# Patient Record
Sex: Male | Born: 1955 | Race: White | Hispanic: No | Smoking: Former smoker
Health system: Southern US, Community
[De-identification: ages and names within clinical notes are randomized; demographics above are authoritative.]

## PROBLEM LIST (undated history)

## (undated) DIAGNOSIS — I700 Atherosclerosis of aorta: Secondary | ICD-10-CM

## (undated) DIAGNOSIS — F32A Depression, unspecified: Secondary | ICD-10-CM

## (undated) DIAGNOSIS — Z87442 Personal history of urinary calculi: Secondary | ICD-10-CM

## (undated) DIAGNOSIS — I2510 Atherosclerotic heart disease of native coronary artery without angina pectoris: Secondary | ICD-10-CM

## (undated) DIAGNOSIS — Z982 Presence of cerebrospinal fluid drainage device: Secondary | ICD-10-CM

## (undated) DIAGNOSIS — I4510 Unspecified right bundle-branch block: Secondary | ICD-10-CM

## (undated) DIAGNOSIS — F1010 Alcohol abuse, uncomplicated: Secondary | ICD-10-CM

## (undated) DIAGNOSIS — F329 Major depressive disorder, single episode, unspecified: Secondary | ICD-10-CM

## (undated) DIAGNOSIS — D751 Secondary polycythemia: Secondary | ICD-10-CM

## (undated) DIAGNOSIS — Z87898 Personal history of other specified conditions: Secondary | ICD-10-CM

## (undated) DIAGNOSIS — I10 Essential (primary) hypertension: Secondary | ICD-10-CM

## (undated) DIAGNOSIS — Z720 Tobacco use: Secondary | ICD-10-CM

## (undated) DIAGNOSIS — F419 Anxiety disorder, unspecified: Secondary | ICD-10-CM

## (undated) DIAGNOSIS — K5792 Diverticulitis of intestine, part unspecified, without perforation or abscess without bleeding: Secondary | ICD-10-CM

## (undated) DIAGNOSIS — E785 Hyperlipidemia, unspecified: Secondary | ICD-10-CM

## (undated) HISTORY — DX: Major depressive disorder, single episode, unspecified: F32.9

## (undated) HISTORY — DX: Unspecified right bundle-branch block: I45.10

## (undated) HISTORY — DX: Anxiety disorder, unspecified: F41.9

## (undated) HISTORY — PX: COLONOSCOPY: SHX174

## (undated) HISTORY — PX: SKIN GRAFT: SHX250

## (undated) HISTORY — DX: Hyperlipidemia, unspecified: E78.5

## (undated) HISTORY — DX: Atherosclerotic heart disease of native coronary artery without angina pectoris: I25.10

## (undated) HISTORY — DX: Alcohol abuse, uncomplicated: F10.10

## (undated) HISTORY — DX: Essential (primary) hypertension: I10

## (undated) HISTORY — DX: Depression, unspecified: F32.A

## (undated) HISTORY — PX: WISDOM TOOTH EXTRACTION: SHX21

## (undated) HISTORY — DX: Tobacco use: Z72.0

## (undated) HISTORY — DX: Atherosclerosis of aorta: I70.0

## (undated) HISTORY — DX: Presence of cerebrospinal fluid drainage device: Z98.2

---
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
## 2006-05-21 ENCOUNTER — Emergency Department (HOSPITAL_COMMUNITY): Admission: EM | Admit: 2006-05-21 | Discharge: 2006-05-21 | Payer: Self-pay | Admitting: Emergency Medicine

## 2012-02-05 ENCOUNTER — Encounter: Payer: Self-pay | Admitting: Internal Medicine

## 2012-02-06 ENCOUNTER — Encounter: Payer: Self-pay | Admitting: Internal Medicine

## 2012-02-06 ENCOUNTER — Telehealth: Payer: Self-pay | Admitting: Critical Care Medicine

## 2012-02-06 ENCOUNTER — Ambulatory Visit (INDEPENDENT_AMBULATORY_CARE_PROVIDER_SITE_OTHER): Payer: BC Managed Care – PPO | Admitting: Internal Medicine

## 2012-02-06 VITALS — BP 152/98 | HR 90 | Temp 98.4°F | Ht 71.0 in | Wt 218.4 lb

## 2012-02-06 DIAGNOSIS — I10 Essential (primary) hypertension: Secondary | ICD-10-CM

## 2012-02-06 DIAGNOSIS — R05 Cough: Secondary | ICD-10-CM | POA: Insufficient documentation

## 2012-02-06 MED ORDER — TRAMADOL HCL 50 MG PO TABS: ORAL_TABLET | ORAL | Status: AC

## 2012-02-06 NOTE — Assessment & Plan Note (Signed)
The most common causes of chronic cough in immunocompetent adults include the following: upper airway cough syndrome (UACS), previously referred to as postnasal drip syndrome (PNDS), which is caused by variety of rhinosinus conditions; (2) asthma; (3) GERD; (4) chronic bronchitis from cigarette smoking or other inhaled environmental irritants; (5) nonasthmatic eosinophilic bronchitis; and (6) bronchiectasis.   These conditions, singly or in combination, have accounted for up to 94% of the causes of chronic cough in prospective studies.   Other conditions have constituted no >6% of the causes in prospective studies These have included bronchogenic carcinoma, chronic interstitial pneumonia, sarcoidosis, left ventricular failure, ACEI-induced cough, and aspiration from a condition associated with pharyngeal dysfunction.   Chronic cough is often simultaneously caused by more than one condition. A single cause has been found from 38 to 82% of the time, multiple causes from 18 to 62%. Multiply caused cough has been the result of three diseases up to 42% of the time.   Most likely this is all acie related so try off acei first and eliminate cyclical coughing with tramadol.

## 2012-02-06 NOTE — Patient Instructions (Signed)
Stop lisinopril   Bystolic 10 mg one daily.  Take delsym two tsp every 12 hours and supplement if needed with  tramadol 50 mg up to 1-  2 every 4 hours to suppress the urge to cough. Swallowing water or using ice chips/non mint and menthol containing candies (such as lifesavers or sugarless jolly ranchers) are also effective.  You should rest your voice and avoid activities that you know make you cough.  Once you have eliminated the cough for 3 straight days try reducing the tramadol first,  then the delsym as tolerated.    GERD (REFLUX)  is an extremely common cause of respiratory symptoms like a cough, many times with no significant heartburn at all.    It can be treated with medication, but also with lifestyle changes including avoidance of late meals, excessive alcohol, smoking cessation, and avoid fatty foods, chocolate, peppermint, colas, red wine, and acidic juices such as orange juice.  NO MINT OR MENTHOL PRODUCTS SO NO COUGH DROPS  USE SUGARLESS CANDY INSTEAD (jolley ranchers or Stover's)  NO OIL BASED VITAMINS - use powdered substitutes.  If not 100% better in 4 weeks return here, if better return to Dr Earl Gala.

## 2012-02-06 NOTE — Telephone Encounter (Signed)
At OV today, was recommended to use tramadol for cough, However this was NOT sent to [facility] on eprescribe.  I sent 40 # to [facility] per cough protocol of the tramadol.

## 2012-02-06 NOTE — Assessment & Plan Note (Addendum)
ACE inhibitors are problematic in  pts with airway complaints because  even experienced pulmonologists can't always distinguish ace effects from copd/asthma/pnds/ allergies etc.  By themselves they don't actually cause a problem, much like oxygen can't by itself start a fire, but they certainly serve as a powerful catalyst or enhancer for any "fire"  or inflammatory process in the upper airway, be it caused by an ET  tube or more commonly reflux (especially in the obese or pts with known GERD or who are on biphoshonates) or URI's, due to interference with bradykinin clearance.  The effects of acei on bradykinin levels occurs in 100% of pt's on acei (unless they surreptitiously stop the med!) but the classic cough is only reported in 5%.  This leaves 95% of pts on acei's  with a variety of syndromes including no identifiable symptom in most  vs non-specific symptoms that wax and wane depending on what other insult is occuring at the level of the upper airway (reflux and cyclical cough come to mind in this case)  Only way to tell what role acei is playing is trial off x 4-6 weeks and f/u with Dr Earl Gala for maint hbp rx if better on bisoprolol  10 mg daily

## 2012-02-06 NOTE — Progress Notes (Signed)
  Subjective:    Patient ID: Luis Henderson, male    DOB: November 15, 1955  MRN: [id number]  HPI  6 yowm quit smoking around 2000 with tendency to "bronchits" every other year or so   referred by Dr Earl Gala for refractory cough/wheezing 02/06/2012 to pulmonary clinic.   02/06/2012 1st pulmonary eval cc acute onset  8 weeks prior to OV  chest congestion with temp 102  with flu-like illness resolved w/in 5 days with residual daily > nightly cough prod minimal white mucus does not wake him up. No better on prednisone, augmentin just finished 2 days prior to OV, no response advair, ? Some better with albuterol. Stopped acei this am.   No sob unless coughing. Sense of pnds daytime, slt hoarseness, some chest tightness midline no ex or pleuritic component  Sleeping ok without nocturnal  or early am exacerbation  of respiratory  c/o's or need for noct saba. Also denies any obvious fluctuation of symptoms with weather or environmental changes or other aggravating or alleviating factors except as outlined above    Review of Systems  Constitutional: Negative for fever and unexpected weight change.  HENT: Positive for ear pain and postnasal drip. Negative for nosebleeds, congestion, sore throat, rhinorrhea, sneezing, trouble swallowing, dental problem and sinus pressure.   Eyes: Negative for redness and itching.  Respiratory: Positive for cough, chest tightness and wheezing. Negative for shortness of breath.   Cardiovascular: Positive for chest pain. Negative for palpitations and leg swelling.  Gastrointestinal: Negative for nausea and vomiting.  Genitourinary: Negative for dysuria.  Musculoskeletal: Negative for joint swelling.  Skin: Negative for rash.  Neurological: Negative for headaches.  Hematological: Does not bruise/bleed easily.  Psychiatric/Behavioral: Negative for dysphoric mood. The patient is not nervous/anxious.        Objective:   Physical Exam  Mod obese pleasant amb wm mild voice  fatigue Wt 218 02/06/2012 HEENT: nl dentition, turbinates, and orophanx. Nl external ear canals without cough reflex   NECK :  without JVD/Nodes/TM/ nl carotid upstrokes bilaterally   LUNGS: no acc muscle use, clear to A and P bilaterally without cough on insp or exp maneuvers   CV:  RRR  no s3 or murmur or increase in P2, no edema   ABD:  soft and nontender with nl excursion in the supine position. No bruits or organomegaly, bowel sounds nl  MS:  warm without deformities, calf tenderness, cyanosis or clubbing  SKIN: warm and dry without lesions    NEURO:  alert, approp, no deficits    cxr reported nl 2 weeks prior to OV      Assessment & Plan:

## 2012-08-24 ENCOUNTER — Emergency Department (HOSPITAL_COMMUNITY): Payer: BC Managed Care – PPO

## 2012-08-24 ENCOUNTER — Emergency Department (HOSPITAL_COMMUNITY)
Admission: EM | Admit: 2012-08-24 | Discharge: 2012-08-24 | Disposition: A | Discharge status: Home / Self Care | Payer: BC Managed Care – PPO | Attending: Emergency Medicine | Admitting: Emergency Medicine

## 2012-08-24 DIAGNOSIS — F329 Major depressive disorder, single episode, unspecified: Secondary | ICD-10-CM | POA: Insufficient documentation

## 2012-08-24 DIAGNOSIS — N200 Calculus of kidney: Secondary | ICD-10-CM | POA: Insufficient documentation

## 2012-08-24 DIAGNOSIS — F411 Generalized anxiety disorder: Secondary | ICD-10-CM | POA: Insufficient documentation

## 2012-08-24 DIAGNOSIS — E119 Type 2 diabetes mellitus without complications: Secondary | ICD-10-CM | POA: Insufficient documentation

## 2012-08-24 DIAGNOSIS — F3289 Other specified depressive episodes: Secondary | ICD-10-CM | POA: Insufficient documentation

## 2012-08-24 DIAGNOSIS — Z87891 Personal history of nicotine dependence: Secondary | ICD-10-CM | POA: Insufficient documentation

## 2012-08-24 DIAGNOSIS — E785 Hyperlipidemia, unspecified: Secondary | ICD-10-CM | POA: Insufficient documentation

## 2012-08-24 DIAGNOSIS — I10 Essential (primary) hypertension: Secondary | ICD-10-CM | POA: Insufficient documentation

## 2012-08-24 DIAGNOSIS — Z79899 Other long term (current) drug therapy: Secondary | ICD-10-CM | POA: Insufficient documentation

## 2012-08-24 LAB — URINALYSIS, ROUTINE W REFLEX MICROSCOPIC
Bilirubin Urine: NEGATIVE
Specific Gravity, Urine: 1.014 (ref 1.005–1.030)
pH: 5 (ref 5.0–8.0)

## 2012-08-24 LAB — CBC WITH DIFFERENTIAL/PLATELET
Eosinophils Absolute: 0.2 10*3/uL (ref 0.0–0.7)
Eosinophils Relative: 2 % (ref 0–5)
HCT: 50.5 % (ref 39.0–52.0)
Hemoglobin: 17.2 g/dL — ABNORMAL HIGH (ref 13.0–17.0)
Lymphs Abs: 2 10*3/uL (ref 0.7–4.0)
MCH: 31 pg (ref 26.0–34.0)
MCV: 91 fL (ref 78.0–100.0)
Monocytes Absolute: 0.7 10*3/uL (ref 0.1–1.0)
Monocytes Relative: 10 % (ref 3–12)
RBC: 5.55 MIL/uL (ref 4.22–5.81)

## 2012-08-24 LAB — COMPREHENSIVE METABOLIC PANEL
Alkaline Phosphatase: 87 U/L (ref 39–117)
BUN: 17 mg/dL (ref 6–23)
Calcium: 9.6 mg/dL (ref 8.4–10.5)
GFR calc Af Amer: >90 mL/min (ref >90)
Glucose, Bld: 127 mg/dL — ABNORMAL HIGH (ref 70–99)
Potassium: 4.8 mEq/L (ref 3.5–5.1)
Total Protein: 7.2 g/dL (ref 6.0–8.3)

## 2012-08-24 LAB — URINE MICROSCOPIC-ADD ON

## 2012-08-24 MED ORDER — ONDANSETRON HCL 4 MG/2ML IJ SOLN
4.0000 mg | Freq: Once | INTRAMUSCULAR | Status: AC
  Administered 2012-08-24: 4 mg via INTRAVENOUS
  Filled 2012-08-24: qty 2

## 2012-08-24 MED ORDER — SODIUM CHLORIDE 0.9 % IV BOLUS (SEPSIS)
1000.0000 mL | Freq: Once | INTRAVENOUS | Status: AC
  Administered 2012-08-24: 1000 mL via INTRAVENOUS

## 2012-08-24 MED ORDER — ONDANSETRON HCL 4 MG PO TABS: 4.0000 mg | ORAL_TABLET | Freq: Four times a day (QID) | ORAL | Status: DC

## 2012-08-24 MED ORDER — HYDROMORPHONE HCL PF 1 MG/ML IJ SOLN
1.0000 mg | Freq: Once | INTRAMUSCULAR | Status: AC
  Administered 2012-08-24: 1 mg via INTRAVENOUS
  Filled 2012-08-24: qty 1

## 2012-08-24 MED ORDER — OXYCODONE-ACETAMINOPHEN 5-325 MG PO TABS: 1.0000 | ORAL_TABLET | Freq: Four times a day (QID) | ORAL | Status: DC | PRN

## 2012-08-24 NOTE — ED Provider Notes (Signed)
History     CSN: [id number]  Arrival date & time 08/24/12  1145   First MD Initiated Contact with Patient 08/24/12 1222      Chief Complaint  Patient presents with  . Flank Pain    (Consider location/radiation/quality/duration/timing/severity/associated sxs/prior treatment) HPI Comments: 56 year old male presents the emergency department with sudden onset right-sided flank pain earlier this morning. He describes pain as sharp, nonradiating rated 10 out of 10. States he passed a stone on Tuesday, and had similar pain prior to that. He had no one around and was unable to seek care at that time. He is unsure what the stone looked like. His history 1 kidney stone in the past prior to these episodes. Admits to associated nausea without vomiting. No fever chills. Denies urinary symptoms including dysuria or hematuria. He has not tried any alleviating factors for his pain.  Patient is a 56 y.o. male presenting with flank pain. The history is provided by the patient.  Flank Pain Associated symptoms include nausea. Pertinent negatives include no chest pain, chills, fever or vomiting.    Past Medical History  Diagnosis Date  . Diabetes mellitus   . Hypertension   . Hyperlipidemia   . Depression   . Anxiety     Past Surgical History  Procedure Date  . Wisdom tooth extraction   . Skin graft     left hand    Family History  Problem Relation Age of Onset  . Hyperlipidemia Father     History  Substance Use Topics  . Smoking status: Former Smoker -- 1.0 packs/day for 28 years    Types: Cigarettes    Quit date: 10/17/1999  . Smokeless tobacco: Never Used  . Alcohol Use: Yes      Review of Systems  Constitutional: Negative for fever and chills.  HENT: Negative.   Respiratory: Negative for shortness of breath.   Cardiovascular: Negative for chest pain.  Gastrointestinal: Positive for nausea. Negative for vomiting.  Genitourinary: Positive for flank pain. Negative for dysuria  and hematuria.  Musculoskeletal: Positive for back pain.  Skin: Negative.   Neurological: Negative for light-headedness.  Psychiatric/Behavioral: Negative.     Allergies  Codeine and Lipitor  Home Medications   Current Outpatient Rx  Name  Route  Sig  Dispense  Refill  . ALBUTEROL SULFATE HFA 108 (90 BASE) MCG/ACT IN AERS   Inhalation   Inhale 2 puffs into the lungs every 4 (four) hours as needed.         Marland Kitchen VITAMIN D PO   Oral   Take by mouth daily.         Marland Kitchen FOLIC ACID 800 MCG PO TABS      Take as directed         . SERTRALINE HCL 100 MG PO TABS   Oral   Take 100 mg by mouth daily.         Marland Kitchen SIMVASTATIN 20 MG PO TABS   Oral   Take 20 mg by mouth every evening.           BP 165/94  Pulse 68  Temp 98.8 F (37.1 C) (Oral)  Resp 18  SpO2 97%  Physical Exam  Constitutional: He is oriented to person, place, and time. He appears well-developed. No distress.       Obese, uncomfortable  HENT:  Head: Normocephalic and atraumatic.  Eyes: Conjunctivae normal and EOM are normal.  Neck: Normal range of motion. Neck supple.  Cardiovascular: Normal rate,  regular rhythm and normal heart sounds.   Pulmonary/Chest: Effort normal and breath sounds normal. No respiratory distress.  Abdominal: Normal appearance and bowel sounds are normal. He exhibits no distension. There is tenderness (right side). There is no rebound, no guarding and no CVA tenderness.  Musculoskeletal: Normal range of motion.  Neurological: He is alert and oriented to person, place, and time.  Skin: Skin is warm and dry.  Psychiatric: His speech is normal and behavior is normal. His mood appears anxious.    ED Course  Procedures (including critical care time)  Labs Reviewed  URINALYSIS, ROUTINE W REFLEX MICROSCOPIC - Abnormal; Notable for the following:    Hgb urine dipstick LARGE (*)     All other components within normal limits  CBC WITH DIFFERENTIAL - Abnormal; Notable for the following:      Hemoglobin 17.2 (*)     All other components within normal limits  COMPREHENSIVE METABOLIC PANEL - Abnormal; Notable for the following:    Glucose, Bld 127 (*)     AST 43 (*)     GFR calc non Af Amer 82 (*)     All other components within normal limits  URINE MICROSCOPIC-ADD ON   Ct Abdomen Pelvis Wo Contrast  08/24/2012  *RADIOLOGY REPORT*  Clinical Data: Flank pain.  Right-sided pain.  Nausea.  History of diabetes, hypertension, stones.  Microhematuria present.  CT ABDOMEN AND PELVIS WITHOUT CONTRAST  Technique:  Multidetector CT imaging of the abdomen and pelvis was performed following the standard protocol without intravenous contrast.  Comparison: 05/21/2006  Findings: Images of the lung bases shows scattered patchy densities, consistent with scarring/non calcified pleural plaques.  The liver is fatty without focal mass.  No focal abnormality identified within the spleen, pancreas, or adrenal glands.  The gallbladder is present.  There is a left renal cyst measuring 7.6 cm in diameter.  There is stranding surrounding the right kidney and right-sided hydronephrosis/hydroureter relative to the a 3 mm right ureteral vesicle junction calculus.  Small bilateral intrarenal calculi identified as well.  The stomach and small bowel loops are normal in appearance.  There are numerous colonic diverticula.  No CT evidence for acute diverticulitis however. The appendix is well seen and has a normal appearance.  No retroperitoneal or mesenteric adenopathy.  There is atherosclerotic calcification of the abdominal aorta.  No aneurysm. Visualized osseous structures have a normal appearance.  IMPRESSION:  1.  Obstructing right ureteral vesicle junction calculus measuring 3 mm. 2.  Left renal cyst. 3.  Diverticulosis without evidence for acute diverticulitis. 4.  Normal appendix.   Original Report Authenticated By: Norva Pavlov, M.D.      1. Nephrolithiasis       MDM  56 y/o male with sudden onset right  sided flank pain. Afebrile. U/A with large hgb. Obtaining CT abdomen/pelvis without contrast. Pain and nausea improved with zofran and dilaudid. 2:05 PM CT scan showing 3mm obstructing right UVJ calculus. Patient's pain still under control. Discharge with zofran and percocet. Advised him to stay well hydrated. Urine strainer given. Urology follow up discussed.       Trevor Mace, PA-C 08/24/12 1406

## 2012-08-24 NOTE — ED Provider Notes (Signed)
Medical screening examination/treatment/procedure(s) were performed by non-physician practitioner and as supervising physician I was immediately available for consultation/collaboration.   Celene Kras, MD 08/24/12 1430

## 2012-08-24 NOTE — ED Notes (Signed)
Pt presents to department for evaluation of R sided flank pain. States history of kidney stones. 10/10 pain upon arrival. Denies urinary symptoms. Also states nausea. He is conscious alert and oriented x4.

## 2012-08-28 ENCOUNTER — Other Ambulatory Visit: Payer: Self-pay | Admitting: Urology

## 2012-08-29 ENCOUNTER — Ambulatory Visit: Admit: 2012-08-29 | Payer: Self-pay | Admitting: Urology

## 2012-08-29 SURGERY — CYSTOURETEROSCOPY, WITH RETROGRADE PYELOGRAM AND STENT INSERTION
Anesthesia: General | Laterality: Right

## 2014-10-15 ENCOUNTER — Ambulatory Visit
Admission: RE | Admit: 2014-10-15 | Discharge: 2014-10-15 | Disposition: A | Discharge status: Home / Self Care | Payer: No Typology Code available for payment source | Source: Ambulatory Visit | Attending: Nurse Practitioner | Admitting: Nurse Practitioner

## 2014-10-15 ENCOUNTER — Other Ambulatory Visit: Payer: Self-pay | Admitting: Nurse Practitioner

## 2014-10-15 DIAGNOSIS — M25552 Pain in left hip: Secondary | ICD-10-CM

## 2014-10-15 DIAGNOSIS — M25551 Pain in right hip: Secondary | ICD-10-CM

## 2014-10-15 DIAGNOSIS — M549 Dorsalgia, unspecified: Secondary | ICD-10-CM

## 2014-10-20 ENCOUNTER — Other Ambulatory Visit: Payer: Self-pay | Admitting: Internal Medicine

## 2014-10-20 DIAGNOSIS — M545 Low back pain, unspecified: Secondary | ICD-10-CM

## 2014-10-21 ENCOUNTER — Ambulatory Visit
Admission: RE | Admit: 2014-10-21 | Discharge: 2014-10-21 | Disposition: A | Discharge status: Home / Self Care | Payer: No Typology Code available for payment source | Source: Ambulatory Visit | Attending: Internal Medicine | Admitting: Internal Medicine

## 2014-10-21 DIAGNOSIS — M545 Low back pain, unspecified: Secondary | ICD-10-CM

## 2015-01-11 ENCOUNTER — Emergency Department (HOSPITAL_COMMUNITY): Payer: Self-pay

## 2015-01-11 ENCOUNTER — Encounter (HOSPITAL_COMMUNITY): Payer: Self-pay | Admitting: Emergency Medicine

## 2015-01-11 ENCOUNTER — Emergency Department (HOSPITAL_COMMUNITY)
Admission: EM | Admit: 2015-01-11 | Discharge: 2015-01-12 | Disposition: A | Discharge status: Home / Self Care | Payer: Self-pay | Attending: Emergency Medicine | Admitting: Emergency Medicine

## 2015-01-11 DIAGNOSIS — I10 Essential (primary) hypertension: Secondary | ICD-10-CM | POA: Insufficient documentation

## 2015-01-11 DIAGNOSIS — R1110 Vomiting, unspecified: Secondary | ICD-10-CM | POA: Insufficient documentation

## 2015-01-11 DIAGNOSIS — Z8659 Personal history of other mental and behavioral disorders: Secondary | ICD-10-CM | POA: Insufficient documentation

## 2015-01-11 DIAGNOSIS — R1011 Right upper quadrant pain: Secondary | ICD-10-CM | POA: Insufficient documentation

## 2015-01-11 DIAGNOSIS — Z7982 Long term (current) use of aspirin: Secondary | ICD-10-CM | POA: Insufficient documentation

## 2015-01-11 DIAGNOSIS — Z79899 Other long term (current) drug therapy: Secondary | ICD-10-CM | POA: Insufficient documentation

## 2015-01-11 DIAGNOSIS — Z8639 Personal history of other endocrine, nutritional and metabolic disease: Secondary | ICD-10-CM | POA: Insufficient documentation

## 2015-01-11 DIAGNOSIS — E119 Type 2 diabetes mellitus without complications: Secondary | ICD-10-CM | POA: Insufficient documentation

## 2015-01-11 LAB — URINE MICROSCOPIC-ADD ON

## 2015-01-11 LAB — COMPREHENSIVE METABOLIC PANEL
ALBUMIN: 4.8 g/dL (ref 3.5–5.2)
ALK PHOS: 80 U/L (ref 39–117)
ALT: 39 U/L (ref 0–53)
AST: 30 U/L (ref 0–37)
Anion gap: 14 (ref 5–15)
BILIRUBIN TOTAL: 1.2 mg/dL (ref 0.3–1.2)
BUN: 18 mg/dL (ref 6–23)
CHLORIDE: 97 mmol/L (ref 96–112)
CO2: 26 mmol/L (ref 19–32)
Calcium: 9.8 mg/dL (ref 8.4–10.5)
Creatinine, Ser: 1.7 mg/dL — ABNORMAL HIGH (ref 0.50–1.35)
GFR calc non Af Amer: 42 mL/min — ABNORMAL LOW (ref >90)
GFR, EST AFRICAN AMERICAN: 49 mL/min — AB (ref >90)
Glucose, Bld: 122 mg/dL — ABNORMAL HIGH (ref 70–99)
Potassium: 4.3 mmol/L (ref 3.5–5.1)
Sodium: 137 mmol/L (ref 135–145)
Total Protein: 7.9 g/dL (ref 6.0–8.3)

## 2015-01-11 LAB — URINALYSIS, ROUTINE W REFLEX MICROSCOPIC
Bilirubin Urine: NEGATIVE
GLUCOSE, UA: 100 mg/dL — AB
Ketones, ur: NEGATIVE mg/dL
Leukocytes, UA: NEGATIVE
Nitrite: NEGATIVE
PH: 5.5 (ref 5.0–8.0)
PROTEIN: 100 mg/dL — AB
Specific Gravity, Urine: 1.021 (ref 1.005–1.030)
Urobilinogen, UA: 0.2 mg/dL (ref 0.0–1.0)

## 2015-01-11 LAB — CBC WITH DIFFERENTIAL/PLATELET
BASOS PCT: 0 % (ref 0–1)
Basophils Absolute: 0 10*3/uL (ref 0.0–0.1)
Eosinophils Absolute: 0 10*3/uL (ref 0.0–0.7)
Eosinophils Relative: 0 % (ref 0–5)
HCT: 55.4 % — ABNORMAL HIGH (ref 39.0–52.0)
HEMOGLOBIN: 18.4 g/dL — AB (ref 13.0–17.0)
LYMPHS ABS: 1.8 10*3/uL (ref 0.7–4.0)
Lymphocytes Relative: 15 % (ref 12–46)
MCH: 29.6 pg (ref 26.0–34.0)
MCHC: 33.2 g/dL (ref 30.0–36.0)
MCV: 89.1 fL (ref 78.0–100.0)
Monocytes Absolute: 1.8 10*3/uL — ABNORMAL HIGH (ref 0.1–1.0)
Monocytes Relative: 15 % — ABNORMAL HIGH (ref 3–12)
NEUTROS PCT: 70 % (ref 43–77)
Neutro Abs: 8.5 10*3/uL — ABNORMAL HIGH (ref 1.7–7.7)
Platelets: 250 10*3/uL (ref 150–400)
RBC: 6.22 MIL/uL — ABNORMAL HIGH (ref 4.22–5.81)
RDW: 13.2 % (ref 11.5–15.5)
WBC: 12.2 10*3/uL — ABNORMAL HIGH (ref 4.0–10.5)

## 2015-01-11 LAB — LIPASE, BLOOD: Lipase: 42 U/L (ref 11–59)

## 2015-01-11 MED ORDER — MORPHINE SULFATE 4 MG/ML IJ SOLN
4.0000 mg | Freq: Once | INTRAMUSCULAR | Status: AC
  Administered 2015-01-11: 4 mg via INTRAVENOUS
  Filled 2015-01-11: qty 1

## 2015-01-11 MED ORDER — SODIUM CHLORIDE 0.9 % IV BOLUS (SEPSIS)
1000.0000 mL | Freq: Once | INTRAVENOUS | Status: AC
  Administered 2015-01-11: 1000 mL via INTRAVENOUS

## 2015-01-11 MED ORDER — ACETAMINOPHEN 325 MG PO TABS
650.0000 mg | ORAL_TABLET | Freq: Once | ORAL | Status: AC
  Administered 2015-01-11: 650 mg via ORAL
  Filled 2015-01-11: qty 2

## 2015-01-11 NOTE — ED Notes (Signed)
Pt provided 4-6 ice chip to wet mouth.  Directed to have nothing more to eat or drink.

## 2015-01-11 NOTE — ED Notes (Signed)
Pt being sent from pcp for evaluation of abdominal pain nausea and vomiting. pcp suspects gall bladder, hx of kidney stones.

## 2015-01-11 NOTE — ED Provider Notes (Signed)
CSN: [id number]     Arrival date & time 01/11/15  1652 History   First MD Initiated Contact with Patient 01/11/15 2037     Chief Complaint  Patient presents with  . Abdominal Pain     (Consider location/radiation/quality/duration/timing/severity/associated sxs/prior Treatment) Patient is a 59 y.o. male presenting with abdominal pain.  Abdominal Pain Pain location:  RUQ Pain quality: sharp   Pain radiates to:  Does not radiate Pain severity:  Severe Onset quality:  Sudden Duration:  4 days Timing:  Constant Progression:  Improving Chronicity:  New Context comment:  Began after eating fatty meal Relieved by:  Nothing Worsened by:  Eating Associated symptoms: hematuria and vomiting   Associated symptoms: no constipation, no diarrhea and no dysuria     Past Medical History  Diagnosis Date  . Diabetes mellitus   . Hypertension   . Hyperlipidemia   . Depression   . Anxiety    Past Surgical History  Procedure Laterality Date  . Wisdom tooth extraction    . Skin graft      left hand   Family History  Problem Relation Age of Onset  . Hyperlipidemia Father    History  Substance Use Topics  . Smoking status: Former Smoker -- 1.00 packs/day for 28 years    Types: Cigarettes    Quit date: 10/17/1999  . Smokeless tobacco: Never Used  . Alcohol Use: Yes    Review of Systems  Gastrointestinal: Positive for vomiting and abdominal pain. Negative for diarrhea and constipation.  Genitourinary: Positive for hematuria. Negative for dysuria.  All other systems reviewed and are negative.     Allergies  Codeine and Lipitor  Home Medications   Prior to Admission medications   Medication Sig Start Date End Date Taking? Authorizing Provider  aspirin EC 325 MG tablet Take 650 mg by mouth every 6 (six) hours as needed for moderate pain (pain).    Yes Historical Provider, MD  ondansetron (ZOFRAN) 4 MG tablet Take 1 tablet (4 mg total) by mouth every 6 (six) hours. Patient not  taking: Reported on 01/11/2015 08/24/12   Carman Ching, PA-C  oxyCODONE-acetaminophen (PERCOCET) 5-325 MG per tablet Take 1-2 tablets by mouth every 6 (six) hours as needed for pain. Patient not taking: Reported on 01/11/2015 08/24/12   Hessie Diener Hess, PA-C   BP 188/92 mmHg  Pulse 83  Temp(Src) 98.2 F (36.8 C) (Oral)  Resp 18  SpO2 97% Physical Exam  Constitutional: He is oriented to person, place, and time. He appears well-developed and well-nourished. No distress.  HENT:  Head: Normocephalic and atraumatic.  Mouth/Throat: Oropharynx is clear and moist.  Eyes: Conjunctivae are normal. Pupils are equal, round, and reactive to light. No scleral icterus.  Neck: Neck supple.  Cardiovascular: Normal rate, regular rhythm, normal heart sounds and intact distal pulses.   No murmur heard. Pulmonary/Chest: Effort normal and breath sounds normal. No stridor. No respiratory distress. He has no wheezes. He has no rales.  Abdominal: Soft. He exhibits no distension. There is tenderness in the right upper quadrant. There is no rigidity, no rebound, no guarding and no CVA tenderness.  Musculoskeletal: Normal range of motion. He exhibits no edema.  Neurological: He is alert and oriented to person, place, and time.  Skin: Skin is warm and dry. No rash noted.  Psychiatric: He has a normal mood and affect. His behavior is normal.  Nursing note and vitals reviewed.   ED Course  Procedures (including critical care time) Labs  Review Labs Reviewed  CBC WITH DIFFERENTIAL/PLATELET - Abnormal; Notable for the following:    WBC 12.2 (*)    RBC 6.22 (*)    Hemoglobin 18.4 (*)    HCT 55.4 (*)    Neutro Abs 8.5 (*)    Monocytes Relative 15 (*)    Monocytes Absolute 1.8 (*)    All other components within normal limits  COMPREHENSIVE METABOLIC PANEL - Abnormal; Notable for the following:    Glucose, Bld 122 (*)    Creatinine, Ser 1.70 (*)    GFR calc non Af Amer 42 (*)    GFR calc Af Amer 49 (*)    All other  components within normal limits  URINALYSIS, ROUTINE W REFLEX MICROSCOPIC - Abnormal; Notable for the following:    APPearance CLOUDY (*)    Glucose, UA 100 (*)    Hgb urine dipstick LARGE (*)    Protein, ur 100 (*)    All other components within normal limits  URINE MICROSCOPIC-ADD ON - Abnormal; Notable for the following:    Casts HYALINE CASTS (*)    All other components within normal limits  LIPASE, BLOOD    Imaging Review US Abdomen Complete  01/11/2015   CLINICAL DATA:  Acute onset of right upper quadrant abdominal pain, nausea and vomiting. Initial encounter.  EXAM: ULTRASOUND ABDOMEN COMPLETE  COMPARISON:  CT of the abdomen and pelvis performed 08/24/2012  FINDINGS: Gallbladder: No gallstones visualized. Minimal gallbladder wall thickening is nonspecific, without definite evidence for obstruction or cholecystitis. No sonographic Murphy sign noted.  Common bile duct: Diameter: 0.6 cm, within normal limits in caliber.  Liver: No focal lesion identified. Focal fatty sparing is noted superior and posterior to the gallbladder fossa. Diffusely increased parenchymal echogenicity likely reflects fatty infiltration.  IVC: No abnormality visualized.  Pancreas: Visualized portion unremarkable.  Spleen: Size and appearance within normal limits.  Right Kidney: Length: 13.8 cm. Echogenicity within normal limits. A suggested 9 mm stone is noted at the interpole region of the right kidney. No mass or hydronephrosis visualized.  Left Kidney: Length: 14.5 cm. Echogenicity within normal limits. A large 7.4 cm anechoic cyst is noted at the upper pole of the left kidney, with mildly increased adjacent calcification. No hydronephrosis visualized.  Abdominal aorta: No aneurysm visualized.  Other findings: None.  IMPRESSION: 1. No definite acute abnormality seen within the abdomen. 2. Diffuse fatty infiltration within the liver. 3. Minimal nonspecific gallbladder wall thickening; no evidence for obstruction or  cholecystitis. 4. Suggested 9 mm stone at the interpole region of the right kidney. No evidence of hydronephrosis. 5. Large 7.4 cm anechoic left renal cyst, with mildly increased adjacent calcification.   Electronically Signed   By: Garald Balding M.D.   On: 01/11/2015 23:42  All radiology studies independently viewed by me.      EKG Interpretation None      MDM   Final diagnoses:  Right upper quadrant abdominal pain    59 yo male with RUQ abdominal pain.  Pain actually seems to be improving, but still with RUQ tenderness.  Started after fatty meal.  Also has hx of kidney stones, but he thinks this feels different.  Does have hematuria, however.  Plan IV morphine, IV fluids, ultrasound.    US shows mild gallbladder wall thickening, but no gallstones, no bile duct dilation.  Pain controlled.  Well appearing.  I think he is stable for outpatient follow up.  Pain could be secondary to gastritis, so recommended PPI and  follow up.    Serita Grit, MD 01/12/15 [id number]

## 2015-01-12 MED ORDER — OMEPRAZOLE 20 MG PO CPDR: 20.0000 mg | DELAYED_RELEASE_CAPSULE | Freq: Every day | ORAL | Status: DC

## 2015-01-12 NOTE — Discharge Instructions (Signed)

## 2015-01-13 ENCOUNTER — Emergency Department (HOSPITAL_COMMUNITY)
Admission: EM | Admit: 2015-01-13 | Discharge: 2015-01-13 | Disposition: A | Discharge status: Home / Self Care | Payer: Self-pay | Attending: Emergency Medicine | Admitting: Emergency Medicine

## 2015-01-13 ENCOUNTER — Emergency Department (HOSPITAL_COMMUNITY): Payer: Self-pay

## 2015-01-13 ENCOUNTER — Encounter (HOSPITAL_COMMUNITY): Payer: Self-pay | Admitting: Emergency Medicine

## 2015-01-13 DIAGNOSIS — Z87891 Personal history of nicotine dependence: Secondary | ICD-10-CM | POA: Insufficient documentation

## 2015-01-13 DIAGNOSIS — E119 Type 2 diabetes mellitus without complications: Secondary | ICD-10-CM | POA: Insufficient documentation

## 2015-01-13 DIAGNOSIS — Z7982 Long term (current) use of aspirin: Secondary | ICD-10-CM | POA: Insufficient documentation

## 2015-01-13 DIAGNOSIS — R109 Unspecified abdominal pain: Secondary | ICD-10-CM

## 2015-01-13 DIAGNOSIS — I10 Essential (primary) hypertension: Secondary | ICD-10-CM | POA: Insufficient documentation

## 2015-01-13 DIAGNOSIS — Y999 Unspecified external cause status: Secondary | ICD-10-CM | POA: Insufficient documentation

## 2015-01-13 DIAGNOSIS — X58XXXA Exposure to other specified factors, initial encounter: Secondary | ICD-10-CM | POA: Insufficient documentation

## 2015-01-13 DIAGNOSIS — Y9289 Other specified places as the place of occurrence of the external cause: Secondary | ICD-10-CM | POA: Insufficient documentation

## 2015-01-13 DIAGNOSIS — T543X1A Toxic effect of corrosive alkalis and alkali-like substances, accidental (unintentional), initial encounter: Secondary | ICD-10-CM | POA: Insufficient documentation

## 2015-01-13 DIAGNOSIS — Y9389 Activity, other specified: Secondary | ICD-10-CM | POA: Insufficient documentation

## 2015-01-13 DIAGNOSIS — N201 Calculus of ureter: Secondary | ICD-10-CM | POA: Insufficient documentation

## 2015-01-13 DIAGNOSIS — Z8659 Personal history of other mental and behavioral disorders: Secondary | ICD-10-CM | POA: Insufficient documentation

## 2015-01-13 LAB — URINE MICROSCOPIC-ADD ON

## 2015-01-13 LAB — COMPREHENSIVE METABOLIC PANEL
ALBUMIN: 4.2 g/dL (ref 3.5–5.2)
ALT: 24 U/L (ref 0–53)
AST: 18 U/L (ref 0–37)
Alkaline Phosphatase: 73 U/L (ref 39–117)
Anion gap: 12 (ref 5–15)
BUN: 26 mg/dL — ABNORMAL HIGH (ref 6–23)
CHLORIDE: 101 mmol/L (ref 96–112)
CO2: 23 mmol/L (ref 19–32)
CREATININE: 1.66 mg/dL — AB (ref 0.50–1.35)
Calcium: 9 mg/dL (ref 8.4–10.5)
GFR calc Af Amer: 51 mL/min — ABNORMAL LOW (ref >90)
GFR calc non Af Amer: 44 mL/min — ABNORMAL LOW (ref >90)
Glucose, Bld: 122 mg/dL — ABNORMAL HIGH (ref 70–99)
Potassium: 3.8 mmol/L (ref 3.5–5.1)
Sodium: 136 mmol/L (ref 135–145)
Total Bilirubin: 0.9 mg/dL (ref 0.3–1.2)
Total Protein: 7.5 g/dL (ref 6.0–8.3)

## 2015-01-13 LAB — URINALYSIS, ROUTINE W REFLEX MICROSCOPIC
Bilirubin Urine: NEGATIVE
Glucose, UA: NEGATIVE mg/dL
KETONES UR: NEGATIVE mg/dL
Nitrite: NEGATIVE
Protein, ur: 30 mg/dL — AB
Specific Gravity, Urine: 1.021 (ref 1.005–1.030)
UROBILINOGEN UA: 1 mg/dL (ref 0.0–1.0)
pH: 5 (ref 5.0–8.0)

## 2015-01-13 LAB — CBC WITH DIFFERENTIAL/PLATELET
BASOS PCT: 1 % (ref 0–1)
Basophils Absolute: 0.1 10*3/uL (ref 0.0–0.1)
EOS ABS: 0.2 10*3/uL (ref 0.0–0.7)
Eosinophils Relative: 2 % (ref 0–5)
HCT: 51.7 % (ref 39.0–52.0)
Hemoglobin: 17.5 g/dL — ABNORMAL HIGH (ref 13.0–17.0)
LYMPHS PCT: 14 % (ref 12–46)
Lymphs Abs: 1.5 10*3/uL (ref 0.7–4.0)
MCH: 29.9 pg (ref 26.0–34.0)
MCHC: 33.8 g/dL (ref 30.0–36.0)
MCV: 88.2 fL (ref 78.0–100.0)
MONO ABS: 1.4 10*3/uL — AB (ref 0.1–1.0)
Monocytes Relative: 14 % — ABNORMAL HIGH (ref 3–12)
Neutro Abs: 7.4 10*3/uL (ref 1.7–7.7)
Neutrophils Relative %: 69 % (ref 43–77)
PLATELETS: 226 10*3/uL (ref 150–400)
RBC: 5.86 MIL/uL — ABNORMAL HIGH (ref 4.22–5.81)
RDW: 12.9 % (ref 11.5–15.5)
WBC: 10.5 10*3/uL (ref 4.0–10.5)

## 2015-01-13 LAB — LIPASE, BLOOD: Lipase: 25 U/L (ref 11–59)

## 2015-01-13 MED ORDER — SODIUM CHLORIDE 0.9 % IV BOLUS (SEPSIS)
1000.0000 mL | Freq: Once | INTRAVENOUS | Status: AC
  Administered 2015-01-13: 1000 mL via INTRAVENOUS

## 2015-01-13 MED ORDER — ONDANSETRON HCL 4 MG/2ML IJ SOLN
4.0000 mg | Freq: Once | INTRAMUSCULAR | Status: AC
  Administered 2015-01-13: 4 mg via INTRAVENOUS
  Filled 2015-01-13: qty 2

## 2015-01-13 MED ORDER — HYDROMORPHONE HCL 1 MG/ML IJ SOLN
1.0000 mg | Freq: Once | INTRAMUSCULAR | Status: AC
  Administered 2015-01-13: 1 mg via INTRAVENOUS
  Filled 2015-01-13: qty 1

## 2015-01-13 MED ORDER — HYDROMORPHONE HCL 1 MG/ML IJ SOLN
1.0000 mg | Freq: Once | INTRAMUSCULAR | Status: AC
Start: 2015-01-13 — End: 2015-01-13
  Administered 2015-01-13: 1 mg via INTRAVENOUS
  Filled 2015-01-13: qty 1

## 2015-01-13 MED ORDER — OXYCODONE-ACETAMINOPHEN 5-325 MG PO TABS: 1.0000 | ORAL_TABLET | Freq: Four times a day (QID) | ORAL | Status: DC | PRN

## 2015-01-13 NOTE — Discharge Instructions (Signed)
Continue to use the Zofran prescribed Monday for nausea.  Ureteral Colic (Kidney Stones) Ureteral colic is the result of a condition when kidney stones form inside the kidney. Once kidney stones are formed they may move into the tube that connects the kidney with the bladder (ureter). If this occurs, this condition may cause pain (colic) in the ureter.  CAUSES  Pain is caused by stone movement in the ureter and the obstruction caused by the stone. SYMPTOMS  The pain comes and goes as the ureter contracts around the stone. The pain is usually intense, sharp, and stabbing in character. The location of the pain may move as the stone moves through the ureter. When the stone is near the kidney the pain is usually located in the back and radiates to the belly (abdomen). When the stone is ready to pass into the bladder the pain is often located in the lower abdomen on the side the stone is located. At this location, the symptoms may mimic those of a urinary tract infection with urinary frequency. Once the stone is located here it often passes into the bladder and the pain disappears completely. TREATMENT   Your caregiver will provide you with medicine for pain relief.  You may require specialized follow-up X-rays.  The absence of pain does not always mean that the stone has passed. It may have just stopped moving. If the urine remains completely obstructed, it can cause loss of kidney function or even complete destruction of the involved kidney. It is your responsibility and in your interest that X-rays and follow-ups as suggested by your caregiver are completed. Relief of pain without passage of the stone can be associated with severe damage to the kidney, including loss of kidney function on that side.  If your stone does not pass on its own, additional measures may be taken by your caregiver to ensure its removal. HOME CARE INSTRUCTIONS   Increase your fluid intake. Water is the preferred fluid since  juices containing vitamin C may acidify the urine making it less likely for certain stones (uric acid stones) to pass.  Strain all urine. A strainer will be provided. Keep all particulate matter or stones for your caregiver to inspect.  Take your pain medicine as directed.  Make a follow-up appointment with your caregiver as directed.  Remember that the goal is passage of your stone. The absence of pain does not mean the stone is gone. Follow your caregiver's instructions.  Only take over-the-counter or prescription medicines for pain, discomfort, or fever as directed by your caregiver. SEEK MEDICAL CARE IF:   Pain cannot be controlled with the prescribed medicine.  You have a fever.  Pain continues for longer than your caregiver advises it should.  There is a change in the pain, and you develop chest discomfort or constant abdominal pain.  You feel faint or pass out. MAKE SURE YOU:   Understand these instructions.  Will watch your condition.  Will get help right away if you are not doing well or get worse. Document Released: 07/12/2005 Document Revised: 01/27/2013 Document Reviewed: 03/29/2011 [facility] Patient Information 2015 [place], [place]. This information is not intended to replace advice given to you by your health care provider. Make sure you discuss any questions you have with your health care provider.

## 2015-01-13 NOTE — ED Provider Notes (Signed)
CSN: [id number]     Arrival date & time 01/13/15  1945 History   First MD Initiated Contact with Patient 01/13/15 2018     Chief Complaint  Patient presents with  . Flank Pain     (Consider location/radiation/quality/duration/timing/severity/associated sxs/prior Treatment) Patient is a 59 y.o. male presenting with abdominal pain.  Abdominal Pain Pain location:  R flank and RUQ Pain quality: aching   Pain radiates to:  Groin Pain severity:  Severe Onset quality:  Gradual Duration:  3 hours Timing:  Constant Context comment:  Seen in the ED a few days ago for RUQ abd pain, which pt believed was biliary.  Pain improved yesterday and during the day today.  Current pain began suddenly at about 7pm Relieved by:  Nothing Worsened by:  Nothing tried Ineffective treatments:  Movement Associated symptoms: nausea   Associated symptoms: no fever and no vomiting     Past Medical History  Diagnosis Date  . Diabetes mellitus   . Hypertension   . Hyperlipidemia   . Depression   . Anxiety    Past Surgical History  Procedure Laterality Date  . Wisdom tooth extraction    . Skin graft      left hand   Family History  Problem Relation Age of Onset  . Hyperlipidemia Father    History  Substance Use Topics  . Smoking status: Former Smoker -- 1.00 packs/day for 28 years    Types: Cigarettes    Quit date: 10/17/1999  . Smokeless tobacco: Never Used  . Alcohol Use: Yes    Review of Systems  Constitutional: Negative for fever.  Gastrointestinal: Positive for nausea and abdominal pain. Negative for vomiting.  Genitourinary: Positive for flank pain.  All other systems reviewed and are negative.     Allergies  Codeine and Lipitor  Home Medications   Prior to Admission medications   Medication Sig Start Date End Date Taking? Authorizing Provider  aspirin EC 325 MG tablet Take 650 mg by mouth every 6 (six) hours as needed for moderate pain (pain).    Yes Historical Provider, MD   omeprazole (PRILOSEC) 20 MG capsule Take 1 capsule (20 mg total) by mouth daily. 01/12/15  Yes Serita Grit, MD  ondansetron (ZOFRAN) 4 MG tablet Take 1 tablet (4 mg total) by mouth every 6 (six) hours. Patient not taking: Reported on 01/11/2015 08/24/12   Carman Ching, PA-C  oxyCODONE-acetaminophen (PERCOCET) 5-325 MG per tablet Take 1-2 tablets by mouth every 6 (six) hours as needed for pain. Patient not taking: Reported on 01/11/2015 08/24/12   Hessie Diener Hess, PA-C   BP 211/99 mmHg  Pulse 91  Temp(Src) 98.5 F (36.9 C) (Oral)  Resp 14  SpO2 92% Physical Exam  Constitutional: He is oriented to person, place, and time. He appears well-developed and well-nourished. No distress.  HENT:  Head: Normocephalic and atraumatic.  Mouth/Throat: Oropharynx is clear and moist.  Eyes: Conjunctivae are normal. Pupils are equal, round, and reactive to light. No scleral icterus.  Neck: Neck supple.  Cardiovascular: Normal rate, regular rhythm, normal heart sounds and intact distal pulses.   No murmur heard. Pulmonary/Chest: Effort normal and breath sounds normal. No stridor. No respiratory distress. He has no wheezes. He has no rales.  Abdominal: Soft. He exhibits no distension. There is tenderness (right flank) in the right upper quadrant. There is CVA tenderness (right). There is no rigidity, no rebound and no guarding.  Musculoskeletal: Normal range of motion. He exhibits no edema.  Neurological: He is alert and oriented to person, place, and time.  Skin: Skin is warm and dry. No rash noted.  Psychiatric: He has a normal mood and affect. His behavior is normal.  Nursing note and vitals reviewed.   ED Course  Procedures (including critical care time) Labs Review Labs Reviewed  CBC WITH DIFFERENTIAL/PLATELET - Abnormal; Notable for the following:    RBC 5.86 (*)    Hemoglobin 17.5 (*)    Monocytes Relative 14 (*)    Monocytes Absolute 1.4 (*)    All other components within normal limits   COMPREHENSIVE METABOLIC PANEL - Abnormal; Notable for the following:    Glucose, Bld 122 (*)    BUN 26 (*)    Creatinine, Ser 1.66 (*)    GFR calc non Af Amer 44 (*)    GFR calc Af Amer 51 (*)    All other components within normal limits  URINALYSIS, ROUTINE W REFLEX MICROSCOPIC - Abnormal; Notable for the following:    Color, Urine AMBER (*)    APPearance CLOUDY (*)    Hgb urine dipstick LARGE (*)    Protein, ur 30 (*)    Leukocytes, UA MODERATE (*)    All other components within normal limits  LIPASE, BLOOD  URINE MICROSCOPIC-ADD ON    Imaging Review Ct Abdomen Pelvis Wo Contrast  01/13/2015   CLINICAL DATA:  Right pain. Acute pain. Flank pain. Recent diagnosis of right ureteral calculus.  EXAM: CT ABDOMEN AND PELVIS WITHOUT CONTRAST  TECHNIQUE: Multidetector CT imaging of the abdomen and pelvis was performed following the standard protocol without IV contrast.  COMPARISON:  Ultrasound 01/11/2015, CT 08/24/2012  FINDINGS: Lower chest:  Lung bases are clear.  Hepatobiliary: No focal hepatic lesions non contrast exam. Normal gallbladder.  Pancreas: Pancreas is normal. No ductal dilatation. No pancreatic inflammation.  Spleen: Normal spleen.  Adrenals/urinary tract: Adrenal glands normal.  There is mild hydronephrosis and hydroureter of the right kidney. This secondary to an obstructing calculus in the proximal right ureter. This calculus measures 12 mm in craniocaudad dimension and is positioned at the L4-L5 vertebral body level. This calculus is evident on the CT topogram.  There is a 7 mm calculus within the left kidney. Large left renal cyst. There is additional 5 mm calculus in the lower pole of the right kidney. No distal ureteral calculi. No bladder calculi.  Stomach/Bowel: Stomach, small bowel, appendix, cecum normal. The colon demonstrates several diverticula of the sigmoid colon. No acute findings.  Vascular/Lymphatic: Abdominal aorta is normal caliber. There is no retroperitoneal or  periportal lymphadenopathy. No pelvic lymphadenopathy.  Reproductive: Prostate gland is normal. There are bilateral small fat filled inguinal hernias.  Musculoskeletal: No aggressive osseous lesion.  IMPRESSION: 1. Obstructing calculus of the proximal right ureter with mild hydronephrosis and hydroureter. 2. Above obstructing calculus is evident on the CT topogram. 3. Bilateral nephrolithiasis.   Electronically Signed   By: Suzy Bouchard M.D.   On: 01/13/2015 21:04  All radiology studies independently viewed by me.      EKG Interpretation None      MDM   Final diagnoses:  Right flank pain  Right ureteral stone  Accidental ingestion of caustic alkali    59 year old male with right flank pain. Was seen in the emergency department by me 2 days ago, at which time he had an ultrasound which demonstrated a large right-sided stone within the kidney. However, now he has developed new sudden onset right flank pain. CT demonstrated a  large 12 mm stone in his proximal right ureter. Pain was controlled after multiple doses of IV diluted. Kidney function stable. No evidence of infection on UA. Discussed his case with Dr. Tresa Moore (urology) who is comfortable with patient going home with very close follow up.      Serita Grit, MD 01/14/15 [phone]

## 2015-01-13 NOTE — ED Notes (Signed)
Pt was seen here on Monday night and was diagnosed with a kidney stone on the right side  Pt states he is having pain in the right flank area that radiates down to the groin area  Pt denies nausea or vomiting at this time

## 2015-01-14 ENCOUNTER — Other Ambulatory Visit: Payer: Self-pay | Admitting: Urology

## 2015-01-14 ENCOUNTER — Ambulatory Visit (HOSPITAL_COMMUNITY): Payer: Self-pay

## 2015-01-14 ENCOUNTER — Encounter (HOSPITAL_COMMUNITY): Payer: Self-pay | Admitting: *Deleted

## 2015-01-14 NOTE — Progress Notes (Signed)
Called requested order be released to Epic sign and held for Same day surgery 01-15-15 Thanks

## 2015-01-15 ENCOUNTER — Ambulatory Visit (HOSPITAL_COMMUNITY)
Admission: RE | Admit: 2015-01-15 | Discharge: 2015-01-15 | Disposition: A | Discharge status: Home / Self Care | Payer: Self-pay | Source: Ambulatory Visit | Attending: Urology | Admitting: Urology

## 2015-01-15 ENCOUNTER — Encounter (HOSPITAL_COMMUNITY)
Admission: RE | Disposition: A | Discharge status: Home / Self Care | Payer: Self-pay | Source: Ambulatory Visit | Attending: Urology

## 2015-01-15 ENCOUNTER — Encounter (HOSPITAL_COMMUNITY): Payer: Self-pay | Admitting: *Deleted

## 2015-01-15 ENCOUNTER — Ambulatory Visit (HOSPITAL_COMMUNITY): Payer: Self-pay | Admitting: Anesthesiology

## 2015-01-15 ENCOUNTER — Ambulatory Visit (HOSPITAL_COMMUNITY): Payer: Self-pay

## 2015-01-15 DIAGNOSIS — Z87891 Personal history of nicotine dependence: Secondary | ICD-10-CM | POA: Insufficient documentation

## 2015-01-15 DIAGNOSIS — Z886 Allergy status to analgesic agent status: Secondary | ICD-10-CM | POA: Insufficient documentation

## 2015-01-15 DIAGNOSIS — F419 Anxiety disorder, unspecified: Secondary | ICD-10-CM | POA: Insufficient documentation

## 2015-01-15 DIAGNOSIS — N1330 Unspecified hydronephrosis: Secondary | ICD-10-CM | POA: Insufficient documentation

## 2015-01-15 DIAGNOSIS — E119 Type 2 diabetes mellitus without complications: Secondary | ICD-10-CM | POA: Insufficient documentation

## 2015-01-15 DIAGNOSIS — N201 Calculus of ureter: Secondary | ICD-10-CM | POA: Insufficient documentation

## 2015-01-15 DIAGNOSIS — E785 Hyperlipidemia, unspecified: Secondary | ICD-10-CM | POA: Insufficient documentation

## 2015-01-15 DIAGNOSIS — I10 Essential (primary) hypertension: Secondary | ICD-10-CM | POA: Insufficient documentation

## 2015-01-15 DIAGNOSIS — F329 Major depressive disorder, single episode, unspecified: Secondary | ICD-10-CM | POA: Insufficient documentation

## 2015-01-15 DIAGNOSIS — Z7982 Long term (current) use of aspirin: Secondary | ICD-10-CM | POA: Insufficient documentation

## 2015-01-15 HISTORY — PX: CYSTOSCOPY WITH RETROGRADE PYELOGRAM, URETEROSCOPY AND STENT PLACEMENT: SHX5789

## 2015-01-15 LAB — GLUCOSE, CAPILLARY: Glucose-Capillary: 123 mg/dL — ABNORMAL HIGH (ref 70–99)

## 2015-01-15 SURGERY — CYSTOURETEROSCOPY, WITH RETROGRADE PYELOGRAM AND STENT INSERTION
Anesthesia: General | Laterality: Right

## 2015-01-15 MED ORDER — PROPOFOL 10 MG/ML IV BOLUS
INTRAVENOUS | Status: AC
  Filled 2015-01-15: qty 20

## 2015-01-15 MED ORDER — FENTANYL CITRATE 0.05 MG/ML IJ SOLN: 25.0000 ug | INTRAMUSCULAR | Status: DC | PRN

## 2015-01-15 MED ORDER — ONDANSETRON HCL 4 MG/2ML IJ SOLN
INTRAMUSCULAR | Status: DC | PRN
  Administered 2015-01-15: 4 mg via INTRAVENOUS

## 2015-01-15 MED ORDER — LACTATED RINGERS IV SOLN
INTRAVENOUS | Status: DC
  Administered 2015-01-15: 11:00:00 via INTRAVENOUS

## 2015-01-15 MED ORDER — TAMSULOSIN HCL 0.4 MG PO CAPS
0.4000 mg | ORAL_CAPSULE | Freq: Once | ORAL | Status: DC
  Filled 2015-01-15: qty 1

## 2015-01-15 MED ORDER — CIPROFLOXACIN IN D5W 200 MG/100ML IV SOLN
200.0000 mg | INTRAVENOUS | Status: AC
  Administered 2015-01-15: 200 mg via INTRAVENOUS
  Filled 2015-01-15: qty 100

## 2015-01-15 MED ORDER — LIDOCAINE HCL (CARDIAC) 20 MG/ML IV SOLN
INTRAVENOUS | Status: DC | PRN
  Administered 2015-01-15: 100 mg via INTRAVENOUS

## 2015-01-15 MED ORDER — PHENAZOPYRIDINE HCL 200 MG PO TABS: 200.0000 mg | ORAL_TABLET | Freq: Three times a day (TID) | ORAL | Status: DC | PRN

## 2015-01-15 MED ORDER — OXYCODONE HCL 10 MG PO TABS: 10.0000 mg | ORAL_TABLET | ORAL | Status: DC | PRN

## 2015-01-15 MED ORDER — MIDAZOLAM HCL 5 MG/5ML IJ SOLN
INTRAMUSCULAR | Status: DC | PRN
  Administered 2015-01-15: 2 mg via INTRAVENOUS

## 2015-01-15 MED ORDER — FENTANYL CITRATE 0.05 MG/ML IJ SOLN
INTRAMUSCULAR | Status: DC | PRN
  Administered 2015-01-15 (×2): 50 ug via INTRAVENOUS

## 2015-01-15 MED ORDER — MEPERIDINE HCL 50 MG/ML IJ SOLN: 6.2500 mg | INTRAMUSCULAR | Status: DC | PRN

## 2015-01-15 MED ORDER — PROMETHAZINE HCL 25 MG/ML IJ SOLN: 6.2500 mg | INTRAMUSCULAR | Status: DC | PRN

## 2015-01-15 MED ORDER — LIDOCAINE HCL 2 % EX GEL
CUTANEOUS | Status: AC
  Filled 2015-01-15: qty 10

## 2015-01-15 MED ORDER — LACTATED RINGERS IV SOLN
INTRAVENOUS | Status: DC
  Administered 2015-01-15: 1000 mL via INTRAVENOUS

## 2015-01-15 MED ORDER — PROPOFOL 10 MG/ML IV BOLUS
INTRAVENOUS | Status: DC | PRN
  Administered 2015-01-15: 200 mg via INTRAVENOUS

## 2015-01-15 MED ORDER — FENTANYL CITRATE 0.05 MG/ML IJ SOLN
INTRAMUSCULAR | Status: AC
  Filled 2015-01-15: qty 2

## 2015-01-15 MED ORDER — SODIUM CHLORIDE 0.9 % IR SOLN
Status: DC | PRN
  Administered 2015-01-15: 5000 mL

## 2015-01-15 MED ORDER — OXYCODONE HCL 5 MG PO TABS: 10.0000 mg | ORAL_TABLET | Freq: Once | ORAL | Status: DC

## 2015-01-15 MED ORDER — IOHEXOL 300 MG/ML  SOLN
INTRAMUSCULAR | Status: DC | PRN
  Administered 2015-01-15: 13 mL

## 2015-01-15 MED ORDER — DEXAMETHASONE SODIUM PHOSPHATE 4 MG/ML IJ SOLN
INTRAMUSCULAR | Status: DC | PRN
  Administered 2015-01-15: 10 mg via INTRAVENOUS

## 2015-01-15 MED ORDER — MIDAZOLAM HCL 2 MG/2ML IJ SOLN
INTRAMUSCULAR | Status: AC
  Filled 2015-01-15: qty 2

## 2015-01-15 MED ORDER — PHENAZOPYRIDINE HCL 200 MG PO TABS
200.0000 mg | ORAL_TABLET | Freq: Once | ORAL | Status: AC
Start: 2015-01-15 — End: 2015-01-15
  Administered 2015-01-15: 200 mg via ORAL
  Filled 2015-01-15: qty 1

## 2015-01-15 MED ORDER — PHENAZOPYRIDINE HCL 200 MG PO TABS: 200.0000 mg | ORAL_TABLET | Freq: Once | ORAL | Status: DC

## 2015-01-15 SURGICAL SUPPLY — 25 items
BAG URO CATCHER STRL LF (DRAPE) ×2 IMPLANT
BASKET LASER NITINOL 1.9FR (BASKET) ×2 IMPLANT
BASKET ZERO TIP NITINOL 2.4FR (BASKET) ×1 IMPLANT
BSKT STON RTRVL 120 1.9FR (BASKET) ×1
BSKT STON RTRVL ZERO TP 2.4FR (BASKET) ×1
CATH INTERMIT  6FR 70CM (CATHETERS) ×2 IMPLANT
CLOTH BEACON ORANGE TIMEOUT ST (SAFETY) ×2 IMPLANT
FIBER LASER FLEXIVA 1000 (UROLOGICAL SUPPLIES) IMPLANT
FIBER LASER FLEXIVA 200 (UROLOGICAL SUPPLIES) ×1 IMPLANT
FIBER LASER FLEXIVA 365 (UROLOGICAL SUPPLIES) IMPLANT
FIBER LASER FLEXIVA 550 (UROLOGICAL SUPPLIES) IMPLANT
FIBER LASER TRAC TIP (UROLOGICAL SUPPLIES) ×1 IMPLANT
GLOVE BIOGEL M 8.0 STRL (GLOVE) ×2 IMPLANT
GOWN STRL REUS W/ TWL XL LVL3 (GOWN DISPOSABLE) ×1 IMPLANT
GOWN STRL REUS W/TWL XL LVL3 (GOWN DISPOSABLE) ×4 IMPLANT
GUIDEWIRE ANG ZIPWIRE 038X150 (WIRE) IMPLANT
GUIDEWIRE STR DUAL SENSOR (WIRE) ×1 IMPLANT
KIT BALLN UROMAX 15FX4 (MISCELLANEOUS) IMPLANT
KIT BALLN UROMAX 26 75X4 (MISCELLANEOUS) ×1
MANIFOLD NEPTUNE II (INSTRUMENTS) ×2 IMPLANT
PACK CYSTO (CUSTOM PROCEDURE TRAY) ×2 IMPLANT
SHEATH ACCESS URETERAL 38CM (SHEATH) ×1 IMPLANT
SHIELD EYE BINOCULAR (MISCELLANEOUS) ×1 IMPLANT
STENT CONTOUR 6FRX26X.038 (STENTS) ×2 IMPLANT
TUBING CONNECTING 10 (TUBING) ×2 IMPLANT

## 2015-01-15 NOTE — Transfer of Care (Signed)
Immediate Anesthesia Transfer of Care Note  Patient: [name]  Procedure(s) Performed: Procedure(s): CYSTOSCOPY, RIGHT URETEROSCOPY/ RETROGRADE PYELOGRAM,HOLMIUM LASER/ LITHOTRIPSY, STENT PLACEMENT, URETERAL BALLOON DIALATION (Right)  Patient Location: PACU  Anesthesia Type:General  Level of Consciousness: sedated  Airway & Oxygen Therapy: Patient Spontanous Breathing and Patient connected to face mask oxygen  Post-op Assessment: Report given to RN and Post -op Vital signs reviewed and stable  Post vital signs: Reviewed and stable  Last Vitals:  Filed Vitals:   01/15/15 0622  BP: 155/92  Pulse: 90  Temp: 37.3 C  Resp: 18    Complications: No apparent anesthesia complications

## 2015-01-15 NOTE — Anesthesia Postprocedure Evaluation (Signed)
  Anesthesia Post-op Note  Patient: Luis Henderson  Procedure(s) Performed: Procedure(s) (LRB): CYSTOSCOPY, RIGHT URETEROSCOPY/ RETROGRADE PYELOGRAM,HOLMIUM LASER/ LITHOTRIPSY, STENT PLACEMENT, URETERAL BALLOON DIALATION (Right)  Patient Location: PACU  Anesthesia Type: General  Level of Consciousness: awake and alert   Airway and Oxygen Therapy: Patient Spontanous Breathing  Post-op Pain: mild  Post-op Assessment: Post-op Vital signs reviewed, Patient's Cardiovascular Status Stable, Respiratory Function Stable, Patent Airway and No signs of Nausea or vomiting  Last Vitals:  Filed Vitals:   01/15/15 1314  BP: 157/78  Pulse: 84  Temp: 36.8 C  Resp: 16    Post-op Vital Signs: stable   Complications: No apparent anesthesia complications

## 2015-01-15 NOTE — Op Note (Signed)
Preoperative diagnosis: Right ureteral calculus  Postoperative diagnosis: Right  ureteral calculus  Procedure:  1. Cystoscopy with right ureteral balloon dilation 2. Right retrograde pyelography with interpretation 3. Right ureteroscopy with laser lithotripsy and stone removal 4. Right ureteral stent placement (6Fr x 26 cm) with a string  Surgeon: Dr. Kathie Rhodes  Resident: Milon Score, MD  Anesthesia: General  Complications: None  Intraoperative findings: Right retrograde pyelography demonstrated filling defect at expected level of the stone between L4-L5. Minimal hydronephrosis or calyceal dilation, mild right proximal hydroureter. Stent was in good position in the renal pelvic but with suboptimal curl at the end of the case.  EBL: Minimal  Specimens: 1. Right ureteral calculus  Disposition of specimens: [facility] for stone analysis  Indication: Luis Henderson is a 59 y.o. male patient with urolithiasis. After reviewing the management options for treatment, they elected to proceed with the above surgical procedure(s). We have discussed the potential benefits and risks of the procedure, side effects of the proposed treatment, the likelihood of the patient achieving the goals of the procedure, and any potential problems that might occur during the procedure or recuperation. Informed consent has been obtained.  Description of procedure:  The patient was taken to the operating room and general anesthesia was induced.  The patient was placed in the dorsal lithotomy position, prepped and draped in the usual sterile fashion, and preoperative antibiotics were administered. A preoperative time-out was performed.   Cystourethroscopy was performed.  The patient's urethra was examined and was normal. The bladder was then systematically examined in its entirety. There was no evidence for any bladder tumors, stones, or other mucosal pathology.    Attention then  turned to the Right ureteral orifice and a ureteral catheter was used to intubate the ureteral orifice.  Omnipaque contrast was injected through the ureteral catheter and a retrograde pyelogram was performed with findings as dictated above.  A 0.38 sensor guidewire was then advanced up the Right ureter into the renal pelvis under fluoroscopic guidance. In order to be able to place our ureteral access sheath, we had to dilate with a 10cm ureteral dilating balloon up to 16 psi and left it in place for 30-60 seconds. A 12/14 Fr ureteral access sheath was then advanced over the guide wire. The digital flexible ureteroscope was then advanced through the access sheath into the ureter next to the guidewire and the calculus was identified and was located in the mid ureter.   The stone was then fragmented with the 200 micron holmium laser fiber on a setting of 0.8J and frequency of 12 Hz.   All sizable stones were then removed with a zero tip nitinol basket.  Reinspection of the ureter/renal pelvis revealed no remaining visible stones or fragments of significant size.   The safety wire was then replaced and the access sheath removed.  The guidewire was backloaded through the cystoscope and a ureteral stent was advance over the wire using Seldinger technique.  The stent was positioned appropriately under fluoroscopic and cystoscopic guidance.  The wire was then removed with an adequate stent curl noted in the renal pelvis as well as in the bladder.  The bladder was then emptied and the procedure ended.  The patient appeared to tolerate the procedure well and without complications.  The patient was able to be awakened and transferred to the recovery unit in satisfactory condition.

## 2015-01-15 NOTE — Anesthesia Preprocedure Evaluation (Addendum)
Anesthesia Evaluation  Patient identified by MRN, date of birth, ID band Patient awake    Reviewed: Allergy & Precautions, NPO status , Patient's Chart, lab work & pertinent test results  Airway Mallampati: II  TM Distance: >3 FB Neck ROM: Full    Dental no notable dental hx.    Pulmonary neg pulmonary ROS, former smoker,  breath sounds clear to auscultation  Pulmonary exam normal       Cardiovascular hypertension, Pt. on medications Rhythm:Regular Rate:Normal     Neuro/Psych negative neurological ROS  negative psych ROS   GI/Hepatic negative GI ROS, Neg liver ROS,   Endo/Other  negative endocrine ROSdiabetes  Renal/GU negative Renal ROS  negative genitourinary   Musculoskeletal negative musculoskeletal ROS (+)   Abdominal   Peds negative pediatric ROS (+)  Hematology negative hematology ROS (+)   Anesthesia Other Findings   Reproductive/Obstetrics negative OB ROS                            Anesthesia Physical Anesthesia Plan  ASA: II  Anesthesia Plan: General   Post-op Pain Management:    Induction: Intravenous  Airway Management Planned: LMA  Additional Equipment:   Intra-op Plan:   Post-operative Plan: Extubation in OR  Informed Consent: I have reviewed the patients History and Physical, chart, labs and discussed the procedure including the risks, benefits and alternatives for the proposed anesthesia with the patient or authorized representative who has indicated his/her understanding and acceptance.   Dental advisory given  Plan Discussed with: CRNA  Anesthesia Plan Comments:         Anesthesia Quick Evaluation

## 2015-01-15 NOTE — H&P (Signed)
Nephrolithiasis:  CT 08/2012: LMP 32mm. RLP 87mm, 92mm. Right UVJ 41mm. He passed his right UVJ stone.  Stone analysis: 80% uric acid and 20% calcium oxalate.    Left renal cyst: 6 HU on non-con CT- no further f/u needed     Interval history: He presented to the emergency room on 01/12/15 with sharp right upper quadrant pain without radiation that was said and of onset and severe. It was constant. A renal ultrasound was performed at that time which revealed no evidence of hydronephrosis on the right or left sides. A single stone was seen within the right kidney and a large cyst was seen within the left kidney. A CT scan done on 01/13/15 revealed an 8 mm stone seen in the right ureter at the level of the    L4-5 interspace on the right hand side. There is a right renal calculus noted as well as a stone associated with the large cyst on the left side.  He has been to the emergency room 2 days in a row. He continues to have intermittent significant pain.   Past Medical History Problems  1. History of  Anxiety (Symptom) 300.00 2. History of  Depression 311 3. History of  Diabetes Mellitus 250.00 4. History of  Hyperlipidemia 272.4 5. History of  Hypertension 401.9  Surgical History Problems  1. History of  Hand Surgery Left  Current Meds 1. Aspirin 325 MG Oral Tablet; Therapy: (Recorded:11Nov2013) to 2. Bystolic 10 MG Oral Tablet; Therapy: [phone] to 3. Folic Acid 809 MCG Oral Tablet; Therapy: (Recorded:11Nov2013) to 4. Oxycodone-Acetaminophen 5-325 MG Oral Tablet; Therapy: [id number] to 5. Sertraline HCl 100 MG Oral Tablet; Therapy: 01Oct2013 to 6. Simvastatin 20 MG Oral Tablet; Therapy: [id number] to  Allergies Medication  1. Codeine Derivatives  Family History Problems  1. Paternal history of  Death In The Family Father 2. Maternal history of  Family Health Status - Mother's Age 27. Family history of  Family Health Status Number Of Children  Social History Problems     Alcohol Use   Caffeine Use   Former Smoker V15.82   Marital History - Separated   Occupation:  Review of Systems Constitutional, skin, eye, otolaryngeal, hematologic/lymphatic, cardiovascular, pulmonary, endocrine, musculoskeletal, gastrointestinal, neurological and psychiatric system(s) were reviewed and pertinent findings if present are noted.  Gastrointestinal: nausea.    Vitals Vital Signs  BMI Calculated: 31.08 BSA Calculated: 2.2 Height: 5 ft 11 in Weight: 222 lb  Blood Pressure: 154 / 83 Temperature: 98.5 F Heart Rate: 71  Physical Exam Constitutional: Well nourished and well developed.  ENT:. The ears and nose are normal in appearance. The oropharynx is normal.  Neck: The appearance of the neck is normal and no neck mass is present.  Pulmonary: No respiratory distress and normal respiratory rhythm and effort.  Abdomen: The abdomen is soft and nontender. The abdomen is no rebound. No CVA tenderness.  Lymphatics: The posterior cervical and supraclavicular nodes are not enlarged or tender.      We discussed the management of urinary stones. These options include observation, ureteroscopy, shockwave lithotripsy, and PCNL. We discussed which options are relevant to these particular stones. We discussed the natural history of stones as well as the complications of untreated stones and the impact on quality of life without treatment as well as with each of the above listed treatments. We also discussed the efficacy of each treatment in its ability to clear the stone burden. With any of these management options I discussed the  signs and symptoms of infection and the need for emergent treatment should these be experienced. For each option we discussed the ability of each procedure to clear the patient of their stone burden.    For observation I described the risks which include but are not limited to silent renal damage, life-threatening infection, need for emergent surgery,  failure to pass stone, and pain.    For ureteroscopy I described the risks which include heart attack, stroke, pulmonary embolus, death, bleeding, infection, damage to contiguous structures, positioning injury, ureteral stricture, ureteral avulsion, ureteral injury, need for ureteral stent, inability to perform ureteroscopy, need for an interval procedure, inability to clear stone burden, stent discomfort and pain.    For shockwave lithotripsy I described the risks which include arrhythmia, kidney contusion, kidney hemorrhage, need for transfusion, long-term risk of diabetes or hypertension, back discomfort, flank ecchymosis, flank abrasion, inability to break up stone, inability to pass stone fragments, Steinstrasse, infection associated with obstructing stones, need for different surgical procedure and possible need for repeat shockwave lithotripsy.    Because of the timing with the lithotripter and the stones location I have recommended ureteroscopy. He still having some pains on the go ahead and give him some Toradol to take in addition to the Percocet that he has a prescription for already. We have discussed the procedure and he has had all of his questions answered to his satisfaction. He has elected to proceed.

## 2015-01-15 NOTE — Discharge Instructions (Signed)

## 2015-01-15 NOTE — Anesthesia Procedure Notes (Signed)
Procedure Name: LMA Insertion Date/Time: 01/15/2015 8:58 AM Performed by: Riki Sheer Pre-anesthesia Checklist: Patient identified, Emergency Drugs available, Suction available, Patient being monitored and Timeout performed Patient Re-evaluated:Patient Re-evaluated prior to inductionOxygen Delivery Method: Circle system utilized Preoxygenation: Pre-oxygenation with 100% oxygen Intubation Type: IV induction Ventilation: Mask ventilation without difficulty LMA: LMA with gastric port inserted LMA Size: 4.0 Number of attempts: 1 Placement Confirmation: positive ETCO2,  CO2 detector and breath sounds checked- equal and bilateral Tube secured with: Tape Dental Injury: Teeth and Oropharynx as per pre-operative assessment

## 2015-01-15 NOTE — Progress Notes (Signed)
Pt has stent "pink-taped" to penis

## 2015-01-18 ENCOUNTER — Encounter (HOSPITAL_COMMUNITY): Payer: Self-pay | Admitting: Urology

## 2015-01-21 ENCOUNTER — Ambulatory Visit: Payer: Self-pay | Attending: Internal Medicine | Admitting: Internal Medicine

## 2015-01-21 ENCOUNTER — Encounter: Payer: Self-pay | Admitting: Internal Medicine

## 2015-01-21 VITALS — BP 161/85 | HR 68 | Temp 98.0°F | Resp 16 | Ht 71.0 in | Wt 245.0 lb

## 2015-01-21 DIAGNOSIS — Z5329 Procedure and treatment not carried out because of patient's decision for other reasons: Secondary | ICD-10-CM

## 2015-01-21 DIAGNOSIS — R7309 Other abnormal glucose: Secondary | ICD-10-CM | POA: Insufficient documentation

## 2015-01-21 DIAGNOSIS — R7303 Prediabetes: Secondary | ICD-10-CM

## 2015-01-21 DIAGNOSIS — I10 Essential (primary) hypertension: Secondary | ICD-10-CM | POA: Insufficient documentation

## 2015-01-21 DIAGNOSIS — Z5320 Procedure and treatment not carried out because of patient's decision for unspecified reasons: Secondary | ICD-10-CM

## 2015-01-21 LAB — GLUCOSE, POCT (MANUAL RESULT ENTRY): POC Glucose: 110 mg/dl — AB (ref 70–99)

## 2015-01-21 LAB — POCT GLYCOSYLATED HEMOGLOBIN (HGB A1C): Hemoglobin A1C: 6.4

## 2015-01-21 MED ORDER — VALSARTAN 80 MG PO TABS: 80.0000 mg | ORAL_TABLET | Freq: Every day | ORAL | Status: DC

## 2015-01-21 NOTE — Progress Notes (Signed)
Pt is here to establish care. Pt has a history of kidney stones, diabetes, HTN,and hyperlipidemia.

## 2015-01-21 NOTE — Patient Instructions (Signed)
Obesity Obesity is defined as having too much total body fat and a body mass index (BMI) of 30 or more. BMI is an estimate of body fat and is calculated from your height and weight. Obesity happens when you consume more calories than you can burn by exercising or performing daily physical tasks. Prolonged obesity can cause major illnesses or emergencies, such as:   Stroke.  Heart disease.  Diabetes.  Cancer.  Arthritis.  High blood pressure (hypertension).  High cholesterol.  Sleep apnea.  Erectile dysfunction.  Infertility problems. CAUSES   Regularly eating unhealthy foods.  Physical inactivity.  Certain disorders, such as an underactive thyroid (hypothyroidism), Cushing's syndrome, and polycystic ovarian syndrome.  Certain medicines, such as steroids, some depression medicines, and antipsychotics.  Genetics.  Lack of sleep. DIAGNOSIS  A health care provider can diagnose obesity after calculating your BMI. Obesity will be diagnosed if your BMI is 30 or higher.  There are other methods of measuring obesity levels. Some other methods include measuring your skinfold thickness, your waist circumference, and comparing your hip circumference to your waist circumference. TREATMENT  A healthy treatment program includes some or all of the following:  Long-term dietary changes.  Exercise and physical activity.  Behavioral and lifestyle changes.  Medicine only under the supervision of your health care provider. Medicines may help, but only if they are used with diet and exercise programs. An unhealthy treatment program includes:  Fasting.  Fad diets.  Supplements and drugs. These choices do not succeed in long-term weight control.  HOME CARE INSTRUCTIONS   Exercise and perform physical activity as directed by your health care provider. To increase physical activity, try the following:  Use stairs instead of elevators.  Park farther away from store  entrances.  Garden, bike, or walk instead of watching television or using the computer.  Eat healthy, low-calorie foods and drinks on a regular basis. Eat more fruits and vegetables. Use low-calorie cookbooks or take healthy cooking classes.  Limit fast food, sweets, and processed snack foods.  Eat smaller portions.  Keep a daily journal of everything you eat. There are many free websites to help you with this. It may be helpful to measure your foods so you can determine if you are eating the correct portion sizes.  Avoid drinking alcohol. Drink more water and drinks without calories.  Take vitamins and supplements only as recommended by your health care provider.  Weight-loss support groups, Tax adviser, counselors, and stress reduction education can also be very helpful. SEEK IMMEDIATE MEDICAL CARE IF:  You have chest pain or tightness.  You have trouble breathing or feel short of breath.  You have weakness or leg numbness.  You feel confused or have trouble talking.  You have sudden changes in your vision. MAKE SURE YOU:  Understand these instructions.  Will watch your condition.  Will get help right away if you are not doing well or get worse. Document Released: 11/09/2004 Document Revised: 02/16/2014 Document Reviewed: 11/08/2011 [facility] Patient Information 2015 [place], [place]. This information is not intended to replace advice given to you by your health care provider. Make sure you discuss any questions you have with your health care provider. DASH Eating Plan DASH stands for "Dietary Approaches to Stop Hypertension." The DASH eating plan is a healthy eating plan that has been shown to reduce high blood pressure (hypertension). Additional health benefits may include reducing the risk of type 2 diabetes mellitus, heart disease, and stroke. The DASH eating plan may also  help with weight loss. WHAT DO I NEED TO KNOW ABOUT THE DASH EATING PLAN? For the DASH  eating plan, you will follow these general guidelines:  Choose foods with a percent daily value for sodium of less than 5% (as listed on the food label).  Use salt-free seasonings or herbs instead of table salt or sea salt.  Check with your health care provider or pharmacist before using salt substitutes.  Eat lower-sodium products, often labeled as "lower sodium" or "no salt added."  Eat fresh foods.  Eat more vegetables, fruits, and low-fat dairy products.  Choose whole grains. Look for the word "whole" as the first word in the ingredient list.  Choose fish and skinless chicken or [place] more often than red meat. Limit fish, poultry, and meat to 6 oz (170 g) each day.  Limit sweets, desserts, sugars, and sugary drinks.  Choose heart-healthy fats.  Limit cheese to 1 oz (28 g) per day.  Eat more home-cooked food and less restaurant, buffet, and fast food.  Limit fried foods.  Cook foods using methods other than frying.  Limit canned vegetables. If you do use them, rinse them well to decrease the sodium.  When eating at a restaurant, ask that your food be prepared with less salt, or no salt if possible. WHAT FOODS CAN I EAT? Seek help from a dietitian for individual calorie needs. Grains Whole grain or whole wheat bread. Brown rice. Whole grain or whole wheat pasta. Quinoa, bulgur, and whole grain cereals. Low-sodium cereals. Corn or whole wheat flour tortillas. Whole grain cornbread. Whole grain crackers. Low-sodium crackers. Vegetables Fresh or frozen vegetables (raw, steamed, roasted, or grilled). Low-sodium or reduced-sodium tomato and vegetable juices. Low-sodium or reduced-sodium tomato sauce and paste. Low-sodium or reduced-sodium canned vegetables.  Fruits All fresh, canned (in natural juice), or frozen fruits. Meat and Other Protein Products Ground beef (85% or leaner), grass-fed beef, or beef trimmed of fat. Skinless chicken or [place]. Ground chicken or [place]. Pork  trimmed of fat. All fish and seafood. Eggs. Dried beans, peas, or lentils. Unsalted nuts and seeds. Unsalted canned beans. Dairy Low-fat dairy products, such as skim or 1% milk, 2% or reduced-fat cheeses, low-fat ricotta or cottage cheese, or plain low-fat yogurt. Low-sodium or reduced-sodium cheeses. Fats and Oils Tub margarines without trans fats. Light or reduced-fat mayonnaise and salad dressings (reduced sodium). Avocado. Safflower, olive, or canola oils. Natural peanut or almond butter. Other Unsalted popcorn and pretzels. The items listed above may not be a complete list of recommended foods or beverages. Contact your dietitian for more options. WHAT FOODS ARE NOT RECOMMENDED? Grains White bread. White pasta. White rice. Refined cornbread. Bagels and croissants. Crackers that contain trans fat. Vegetables Creamed or fried vegetables. Vegetables in a cheese sauce. Regular canned vegetables. Regular canned tomato sauce and paste. Regular tomato and vegetable juices. Fruits Dried fruits. Canned fruit in light or heavy syrup. Fruit juice. Meat and Other Protein Products Fatty cuts of meat. Ribs, chicken wings, bacon, sausage, bologna, salami, chitterlings, fatback, hot dogs, bratwurst, and packaged luncheon meats. Salted nuts and seeds. Canned beans with salt. Dairy Whole or 2% milk, cream, half-and-half, and cream cheese. Whole-fat or sweetened yogurt. Full-fat cheeses or blue cheese. Nondairy creamers and whipped toppings. Processed cheese, cheese spreads, or cheese curds. Condiments Onion and garlic salt, seasoned salt, table salt, and sea salt. Canned and packaged gravies. Worcestershire sauce. Tartar sauce. Barbecue sauce. Teriyaki sauce. Soy sauce, including reduced sodium. Steak sauce. Fish sauce. Oyster sauce. Cocktail  sauce. Horseradish. Ketchup and mustard. Meat flavorings and tenderizers. Bouillon cubes. Hot sauce. Tabasco sauce. Marinades. Taco seasonings. Relishes. Fats and  Oils Butter, stick margarine, lard, shortening, ghee, and bacon fat. Coconut, palm kernel, or palm oils. Regular salad dressings. Other Pickles and olives. Salted popcorn and pretzels. The items listed above may not be a complete list of foods and beverages to avoid. Contact your dietitian for more information. WHERE CAN I FIND MORE INFORMATION? National [employer], [employer], [employer] Institute: travelstabloid.com Document Released: 09/21/2011 Document Revised: 02/16/2014 Document Reviewed: 08/06/2013 [facility] Patient Information 2015 [place], [place]. This information is not intended to replace advice given to you by your health care provider. Make sure you discuss any questions you have with your health care provider.

## 2015-01-21 NOTE — Progress Notes (Signed)
Patient ID: Luis Henderson, male   DOB: 11-09-1955, 59 y.o.   MRN: [id number]  [id number]:[id number]  [id number]  DOB - 02-28-1956  CC:  Chief Complaint  Patient presents with  . Establish Care       HPI: Luis Henderson is a 59 y.o. male here today to establish medical care.  Patient has past medical history of HTN, prediabetes, HLD, and recent stent placement surgery for kidney stones. Patient reports that he continues to have pain from kidney stones. He has a follow up appointment next week with Urology. He has never been on medication for diabetes.  He reports that he was last on losartan 100 mg daily which did not help his pressures and Lisinopril made him cough. He stopped taking his BP medication several months ago. Lipitor made him itch, simvastatin and crestor he tolerates well.  Patient states that he has not had a colonoscopy in 9 years. He refuses to have a colonoscopy.   Patient has No headache, No chest pain, No abdominal pain - No Nausea, No new weakness tingling or numbness, No Cough - SOB.  Allergies  Allergen Reactions  . Codeine Itching  . Lipitor [Atorvastatin Calcium]     unknown   Past Medical History  Diagnosis Date  . Hypertension   . Hyperlipidemia   . Depression   . Anxiety   . Diabetes mellitus    Current Outpatient Prescriptions on File Prior to Visit  Medication Sig Dispense Refill  . omeprazole (PRILOSEC) 20 MG capsule Take 1 capsule (20 mg total) by mouth daily. (Patient not taking: Reported on 01/21/2015) 14 capsule 0  . Oxycodone HCl 10 MG TABS Take 1 tablet (10 mg total) by mouth every 4 (four) hours as needed. (Patient not taking: Reported on 01/21/2015) 30 tablet 0  . oxyCODONE-acetaminophen (PERCOCET/ROXICET) 5-325 MG per tablet Take 1-2 tablets by mouth every 6 (six) hours as needed for severe pain. (Patient not taking: Reported on 01/21/2015) 15 tablet 0  . phenazopyridine (PYRIDIUM) 200 MG tablet Take 1 tablet (200 mg total) by mouth 3 (three) times  daily as needed for pain. (Patient not taking: Reported on 01/21/2015) 30 tablet 0   No current facility-administered medications on file prior to visit.   Family History  Problem Relation Age of Onset  . Hyperlipidemia Father    History   Social History  . Marital Status: Married    Spouse Name: N/A  . Number of Children: 2  . 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 Smoking status: Former Smoker -- 1.00 packs/day for 28 years    Types: Cigarettes    Quit date: 10/17/1999  . Smokeless tobacco: Never Used  . Alcohol Use: Yes  . Drug Use: No  . Sexual Activity: Not on file   Other Topics Concern  . Not on file   Social History Narrative    Review of Systems: Constitutional: Negative for fever, chills, diaphoresis, activity change, appetite change and fatigue. HENT: Negative for ear pain, nosebleeds, congestion, facial swelling, rhinorrhea, neck pain, neck stiffness and ear discharge.  Eyes: Negative for pain, discharge, redness, itching and visual disturbance. Respiratory: Negative for cough, choking, chest tightness, shortness of breath, wheezing and stridor.  Cardiovascular: Negative for chest pain, palpitations and leg swelling. Gastrointestinal: Negative for abdominal distention. Genitourinary: Negative for dysuria, urgency, frequency, hematuria, flank pain, decreased urine volume, difficulty urinating and dyspareunia.  Musculoskeletal:  Negative for back pain, joint swelling, arthralgia and gait problem. Neurological: Negative for dizziness, tremors, seizures, syncope, facial asymmetry, speech difficulty, weakness, light-headedness, numbness and headaches.  Hematological: Negative for adenopathy. Does not bruise/bleed easily. Psychiatric/Behavioral: Negative for hallucinations, behavioral problems, confusion, dysphoric mood, decreased concentration and agitation.    Objective:   Filed Vitals:    01/21/15 1419  BP: 161/85  Pulse: 68  Temp: 98 F (36.7 C)  Resp: 16    Physical Exam: Constitutional: Patient appears well-developed and well-nourished. No distress. Neck: Normal ROM. Neck supple. No JVD. No tracheal deviation. No thyromegaly. CVS: RRR, S1/S2 +, no murmurs, no gallops, no carotid bruit.  Pulmonary: Effort and breath sounds normal, no stridor, rhonchi, wheezes, rales.  Abdominal: Soft. BS +, no distension, tenderness, rebound or guarding.  Musculoskeletal: Normal range of motion. No edema and no tenderness.  Neuro: Alert. Normal reflexes, muscle tone coordination. No cranial nerve deficit. Skin: Skin is warm and dry. No rash noted. Not diaphoretic. No erythema. No pallor. Psychiatric: Normal mood and affect. Behavior, judgment, thought content normal.  Lab Results  Component Value Date   WBC 10.5 01/13/2015   HGB 17.5* 01/13/2015   HCT 51.7 01/13/2015   MCV 88.2 01/13/2015   PLT 226 01/13/2015   Lab Results  Component Value Date   CREATININE 1.66* 01/13/2015   BUN 26* 01/13/2015   NA 136 01/13/2015   K 3.8 01/13/2015   CL 101 01/13/2015   CO2 23 01/13/2015    Lab Results  Component Value Date   HGBA1C 6.40 01/21/2015   Lipid Panel  No results found for: CHOL, TRIG, HDL, CHOLHDL, VLDL, LDLCALC     Assessment and plan:   Luis Henderson was seen today for establish care.  Diagnoses and all orders for this visit:  Essential hypertension Orders: -     valsartan (DIOVAN) 80 MG tablet; Take 1 tablet (80 mg total) by mouth daily. Will begin patient on Valsartan with hopes to gain control of pressures.   Explained to patient diet modifications that may contribute to elevated BP. Patient will avoid foods that are high in sodium such as  canned soups and vegetables, tomato juice, commercial baked goods, commercially prepared frozen or canned entrees and sauces. Avoid salty snacks, added salt when cooking, and substituting for low sodium herbs or spices Explained  exercise regimen of cardio at least three times weekly to help lower BP and cholesterol  Prediabetes Orders: -     Glucose (CBG) -     HgB A1c -     Microalbumin, urine Went over central weight loss, diet changes, and lifestyle changes.  Colonoscopy refused I have advised patient on benefits of colonoscopy and went over procedure. He states that he has heard too many stories of patient having a perforated bowel after procedure. I will continue to address on subsequent visits.    Return in about 2 weeks (around 02/04/2015) for Nurse Visit-BP check and Lab Visit.    Chari Manning, NP-C [facility] [phone] 01/21/2015, 2:34 PM

## 2015-01-22 LAB — MICROALBUMIN, URINE: MICROALB UR: 6.7 mg/dL — AB (ref <2.0)

## 2015-01-26 ENCOUNTER — Ambulatory Visit: Payer: Self-pay | Attending: Internal Medicine | Admitting: *Deleted

## 2015-01-26 VITALS — BP 133/81 | HR 69 | Temp 98.1°F | Resp 16 | Wt 246.6 lb

## 2015-01-26 DIAGNOSIS — I10 Essential (primary) hypertension: Secondary | ICD-10-CM | POA: Insufficient documentation

## 2015-01-26 DIAGNOSIS — E669 Obesity, unspecified: Secondary | ICD-10-CM

## 2015-01-26 LAB — LIPID PANEL
Cholesterol: 221 mg/dL — ABNORMAL HIGH (ref 0–200)
HDL: 31 mg/dL — ABNORMAL LOW (ref >=40)
LDL CALC: 146 mg/dL — AB (ref 0–99)
TRIGLYCERIDES: 222 mg/dL — AB (ref <150)
Total CHOL/HDL Ratio: 7.1 Ratio
VLDL: 44 mg/dL — ABNORMAL HIGH (ref 0–40)

## 2015-01-26 NOTE — Patient Instructions (Signed)
Fat and Cholesterol Control Diet Fat and cholesterol levels in your blood and organs are influenced by your diet. High levels of fat and cholesterol may lead to diseases of the heart, small and large blood vessels, gallbladder, liver, and pancreas. CONTROLLING FAT AND CHOLESTEROL WITH DIET Although exercise and lifestyle factors are important, your diet is key. That is because certain foods are known to raise cholesterol and others to lower it. The goal is to balance foods for their effect on cholesterol and more importantly, to replace saturated and trans fat with other types of fat, such as monounsaturated fat, polyunsaturated fat, and omega-3 fatty acids. On average, a person should consume no more than 15 to 17 g of saturated fat daily. Saturated and trans fats are considered "bad" fats, and they will raise LDL cholesterol. Saturated fats are primarily found in animal products such as meats, butter, and cream. However, that does not mean you need to give up all your favorite foods. Today, there are good tasting, low-fat, low-cholesterol substitutes for most of the things you like to eat. Choose low-fat or nonfat alternatives. Choose round or loin cuts of red meat. These types of cuts are lowest in fat and cholesterol. Chicken (without the skin), fish, veal, and ground turkey breast are great choices. Eliminate fatty meats, such as hot dogs and salami. Even shellfish have little or no saturated fat. Have a 3 oz (85 g) portion when you eat lean meat, poultry, or fish. Trans fats are also called "partially hydrogenated oils." They are oils that have been scientifically manipulated so that they are solid at room temperature resulting in a longer shelf life and improved taste and texture of foods in which they are added. Trans fats are found in stick margarine, some tub margarines, cookies, crackers, and baked goods.  When baking and cooking, oils are a great substitute for butter. The monounsaturated oils are  especially beneficial since it is believed they lower LDL and raise HDL. The oils you should avoid entirely are saturated tropical oils, such as coconut and palm.  Remember to eat a lot from food groups that are naturally free of saturated and trans fat, including fish, fruit, vegetables, beans, grains (barley, rice, couscous, bulgur wheat), and pasta (without cream sauces).  IDENTIFYING FOODS THAT LOWER FAT AND CHOLESTEROL  Soluble fiber may lower your cholesterol. This type of fiber is found in fruits such as apples, vegetables such as broccoli, potatoes, and carrots, legumes such as beans, peas, and lentils, and grains such as barley. Foods fortified with plant sterols (phytosterol) may also lower cholesterol. You should eat at least 2 g per day of these foods for a cholesterol lowering effect.  Read package labels to identify low-saturated fats, trans fat free, and low-fat foods at the supermarket. Select cheeses that have only 2 to 3 g saturated fat per ounce. Use a heart-healthy tub margarine that is free of trans fats or partially hydrogenated oil. When buying baked goods (cookies, crackers), avoid partially hydrogenated oils. Breads and muffins should be made from whole grains (whole-wheat or whole oat flour, instead of "flour" or "enriched flour"). Buy non-creamy canned soups with reduced salt and no added fats.  FOOD PREPARATION TECHNIQUES  Never deep-fry. If you must fry, either stir-fry, which uses very little fat, or use non-stick cooking sprays. When possible, broil, bake, or roast meats, and steam vegetables. Instead of putting butter or margarine on vegetables, use lemon and herbs, applesauce, and cinnamon (for squash and sweet potatoes). Use nonfat   yogurt, salsa, and low-fat dressings for salads.  LOW-SATURATED FAT / LOW-FAT FOOD SUBSTITUTES Meats / Saturated Fat (g)  Avoid: Steak, marbled (3 oz/85 g) / 11 g  Choose: Steak, lean (3 oz/85 g) / 4 g  Avoid: Hamburger (3 oz/85 g) / 7  g  Choose: Hamburger, lean (3 oz/85 g) / 5 g  Avoid: Ham (3 oz/85 g) / 6 g  Choose: Ham, lean cut (3 oz/85 g) / 2.4 g  Avoid: Chicken, with skin, dark meat (3 oz/85 g) / 4 g  Choose: Chicken, skin removed, dark meat (3 oz/85 g) / 2 g  Avoid: Chicken, with skin, light meat (3 oz/85 g) / 2.5 g  Choose: Chicken, skin removed, light meat (3 oz/85 g) / 1 g Dairy / Saturated Fat (g)  Avoid: Whole milk (1 cup) / 5 g  Choose: Low-fat milk, 2% (1 cup) / 3 g  Choose: Low-fat milk, 1% (1 cup) / 1.5 g  Choose: Skim milk (1 cup) / 0.3 g  Avoid: Hard cheese (1 oz/28 g) / 6 g  Choose: Skim milk cheese (1 oz/28 g) / 2 to 3 g  Avoid: Cottage cheese, 4% fat (1 cup) / 6.5 g  Choose: Low-fat cottage cheese, 1% fat (1 cup) / 1.5 g  Avoid: Ice cream (1 cup) / 9 g  Choose: Sherbet (1 cup) / 2.5 g  Choose: Nonfat frozen yogurt (1 cup) / 0.3 g  Choose: Frozen fruit bar / trace  Avoid: Whipped cream (1 tbs) / 3.5 g  Choose: Nondairy whipped topping (1 tbs) / 1 g Condiments / Saturated Fat (g)  Avoid: Mayonnaise (1 tbs) / 2 g  Choose: Low-fat mayonnaise (1 tbs) / 1 g  Avoid: Butter (1 tbs) / 7 g  Choose: Extra light margarine (1 tbs) / 1 g  Avoid: Coconut oil (1 tbs) / 11.8 g  Choose: Olive oil (1 tbs) / 1.8 g  Choose: Corn oil (1 tbs) / 1.7 g  Choose: Safflower oil (1 tbs) / 1.2 g  Choose: Sunflower oil (1 tbs) / 1.4 g  Choose: Soybean oil (1 tbs) / 2.4 g  Choose: Canola oil (1 tbs) / 1 g Document Released: 10/02/2005 Document Revised: 01/27/2013 Document Reviewed: 12/31/2013 [employer] Patient Information 2015 [place], [place]. This information is not intended to replace advice given to you by your health care provider. Make sure you discuss any questions you have with your health care provider. Diabetes Mellitus and Food It is important for you to manage your blood sugar (glucose) level. Your blood glucose level can be greatly affected by what you eat. Eating healthier  foods in the appropriate amounts throughout the day at about the same time each day will help you control your blood glucose level. It can also help slow or prevent worsening of your diabetes mellitus. Healthy eating may even help you improve the level of your blood pressure and reach or maintain a healthy weight.  HOW CAN FOOD AFFECT ME? Carbohydrates Carbohydrates affect your blood glucose level more than any other type of food. Your dietitian will help you determine how many carbohydrates to eat at each meal and teach you how to count carbohydrates. Counting carbohydrates is important to keep your blood glucose at a healthy level, especially if you are using insulin or taking certain medicines for diabetes mellitus. Alcohol Alcohol can cause sudden decreases in blood glucose (hypoglycemia), especially if you use insulin or take certain medicines for diabetes mellitus. Hypoglycemia can be a life-threatening  condition. Symptoms of hypoglycemia (sleepiness, dizziness, and disorientation) are similar to symptoms of having too much alcohol.  If your health care provider has given you approval to drink alcohol, do so in moderation and use the following guidelines:  Women should not have more than one drink per day, and men should not have more than two drinks per day. One drink is equal to:  12 oz of beer.  5 oz of wine.  1 oz of hard liquor.  Do not drink on an empty stomach.  Keep yourself hydrated. Have water, diet soda, or unsweetened iced tea.  Regular soda, juice, and other mixers might contain a lot of carbohydrates and should be counted. WHAT FOODS ARE NOT RECOMMENDED? As you make food choices, it is important to remember that all foods are not the same. Some foods have fewer nutrients per serving than other foods, even though they might have the same number of calories or carbohydrates. It is difficult to get your body what it needs when you eat foods with fewer nutrients. Examples of  foods that you should avoid that are high in calories and carbohydrates but low in nutrients include:  Trans fats (most processed foods list trans fats on the Nutrition Facts label).  Regular soda.  Juice.  Candy.  Sweets, such as cake, pie, doughnuts, and cookies.  Fried foods. WHAT FOODS CAN I EAT? Have nutrient-rich foods, which will nourish your body and keep you healthy. The food you should eat also will depend on several factors, including:  The calories you need.  The medicines you take.  Your weight.  Your blood glucose level.  Your blood pressure level.  Your cholesterol level. You also should eat a variety of foods, including:  Protein, such as meat, poultry, fish, tofu, nuts, and seeds (lean animal proteins are best).  Fruits.  Vegetables.  Dairy products, such as milk, cheese, and yogurt (low fat is best).  Breads, grains, pasta, cereal, rice, and beans.  Fats such as olive oil, trans fat-free margarine, canola oil, avocado, and olives. DOES EVERYONE WITH DIABETES MELLITUS HAVE THE SAME MEAL PLAN? Because every person with diabetes mellitus is different, there is not one meal plan that works for everyone. It is very important that you meet with a dietitian who will help you create a meal plan that is just right for you. Document Released: 06/29/2005 Document Revised: 10/07/2013 Document Reviewed: 08/29/2013 [facility] Patient Information 2015 [place], [place]. This information is not intended to replace advice given to you by your health care provider. Make sure you discuss any questions you have with your health care provider. Diabetes and Exercise Exercising regularly is important. It is not just about losing weight. It has many health benefits, such as:  Improving your overall fitness, flexibility, and endurance.  Increasing your bone density.  Helping with weight control.  Decreasing your body fat.  Increasing your muscle strength.  Reducing  stress and tension.  Improving your overall health. People with diabetes who exercise gain additional benefits because exercise:  Reduces appetite.  Improves the body's use of blood sugar (glucose).  Helps lower or control blood glucose.  Decreases blood pressure.  Helps control blood lipids (such as cholesterol and triglycerides).  Improves the body's use of the hormone insulin by:  Increasing the body's insulin sensitivity.  Reducing the body's insulin needs.  Decreases the risk for heart disease because exercising:  Lowers cholesterol and triglycerides levels.  Increases the levels of good cholesterol (such as high-density lipoproteins [  HDL]) in the body.  Lowers blood glucose levels. YOUR ACTIVITY PLAN  Choose an activity that you enjoy and set realistic goals. Your health care provider or diabetes educator can help you make an activity plan that works for you. Exercise regularly as directed by your health care provider. This includes:  Performing resistance training twice a week such as push-ups, sit-ups, lifting weights, or using resistance bands.  Performing 150 minutes of cardio exercises each week such as walking, running, or playing sports.  Staying active and spending no more than 90 minutes at one time being inactive. Even short bursts of exercise are good for you. Three 10-minute sessions spread throughout the day are just as beneficial as a single 30-minute session. Some exercise ideas include:  Taking the dog for a walk.  Taking the stairs instead of the elevator.  Dancing to your favorite song.  Doing an exercise video.  Doing your favorite exercise with a friend. RECOMMENDATIONS FOR EXERCISING WITH TYPE 1 OR TYPE 2 DIABETES   Check your blood glucose before exercising. If blood glucose levels are greater than 240 mg/dL, check for urine ketones. Do not exercise if ketones are present.  Avoid injecting insulin into areas of the body that are going to  be exercised. For example, avoid injecting insulin into:  The arms when playing tennis.  The legs when jogging.  Keep a record of:  Food intake before and after you exercise.  Expected peak times of insulin action.  Blood glucose levels before and after you exercise.  The type and amount of exercise you have done.  Review your records with your health care provider. Your health care provider will help you to develop guidelines for adjusting food intake and insulin amounts before and after exercising.  If you take insulin or oral hypoglycemic agents, watch for signs and symptoms of hypoglycemia. They include:  Dizziness.  Shaking.  Sweating.  Chills.  Confusion.  Drink plenty of water while you exercise to prevent dehydration or heat stroke. Body water is lost during exercise and must be replaced.  Talk to your health care provider before starting an exercise program to make sure it is safe for you. Remember, almost any type of activity is better than none. Document Released: 12/23/2003 Document Revised: 02/16/2014 Document Reviewed: 03/11/2013 [facility] Patient Information 2015 [place], [place]. This information is not intended to replace advice given to you by your health care provider. Make sure you discuss any questions you have with your health care provider. Basic Carbohydrate Counting for Diabetes Mellitus Carbohydrate counting is a method for keeping track of the amount of carbohydrates you eat. Eating carbohydrates naturally increases the level of sugar (glucose) in your blood, so it is important for you to know the amount that is okay for you to have in every meal. Carbohydrate counting helps keep the level of glucose in your blood within normal limits. The amount of carbohydrates allowed is different for every person. A dietitian can help you calculate the amount that is right for you. Once you know the amount of carbohydrates you can have, you can count the carbohydrates  in the foods you want to eat. Carbohydrates are found in the following foods:  Grains, such as breads and cereals.  Dried beans and soy products.  Starchy vegetables, such as potatoes, peas, and corn.  Fruit and fruit juices.  Milk and yogurt.  Sweets and snack foods, such as cake, cookies, candy, chips, soft drinks, and fruit drinks. CARBOHYDRATE COUNTING There are  two ways to count the carbohydrates in your food. You can use either of the methods or a combination of both. Reading the "Nutrition Facts" on [place] The "Nutrition Facts" is an area that is included on the labels of almost all packaged food and beverages in the [place]. It includes the serving size of that food or beverage and information about the nutrients in each serving of the food, including the grams (g) of carbohydrate per serving.  Decide the number of servings of this food or beverage that you will be able to eat or drink. Multiply that number of servings by the number of grams of carbohydrate that is listed on the label for that serving. The total will be the amount of carbohydrates you will be having when you eat or drink this food or beverage. Learning Standard Serving Sizes of Food When you eat food that is not packaged or does not include "Nutrition Facts" on the label, you need to measure the servings in order to count the amount of carbohydrates.A serving of most carbohydrate-rich foods contains about 15 g of carbohydrates. The following list includes serving sizes of carbohydrate-rich foods that provide 15 g ofcarbohydrate per serving:   1 slice of bread (1 oz) or 1 six-inch tortilla.    of a hamburger bun or English muffin.  4-6 crackers.   cup unsweetened dry cereal.    cup hot cereal.   cup rice or pasta.    cup mashed potatoes or  of a large baked potato.  1 cup fresh fruit or one small piece of fruit.    cup canned or frozen fruit or fruit juice.  1 cup milk.   cup  plain fat-free yogurt or yogurt sweetened with artificial sweeteners.   cup cooked dried beans or starchy vegetable, such as peas, corn, or potatoes.  Decide the number of standard-size servings that you will eat. Multiply that number of servings by 15 (the grams of carbohydrates in that serving). For example, if you eat 2 cups of strawberries, you will have eaten 2 servings and 30 g of carbohydrates (2 servings x 15 g = 30 g). For foods such as soups and casseroles, in which more than one food is mixed in, you will need to count the carbohydrates in each food that is included. EXAMPLE OF CARBOHYDRATE COUNTING Sample Dinner  3 oz chicken breast.   cup of brown rice.   cup of corn.  1 cup milk.   1 cup strawberries with sugar-free whipped topping.  Carbohydrate Calculation Step 1: Identify the foods that contain carbohydrates:   Rice.   Corn.   Milk.   Strawberries. Step 2:Calculate the number of servings eaten of each:   2 servings of rice.   1 serving of corn.   1 serving of milk.   1 serving of strawberries. Step 3: Multiply each of those number of servings by 15 g:   2 servings of rice x 15 g = 30 g.   1 serving of corn x 15 g = 15 g.   1 serving of milk x 15 g = 15 g.   1 serving of strawberries x 15 g = 15 g. Step 4: Add together all of the amounts to find the total grams of carbohydrates eaten: 30 g + 15 g + 15 g + 15 g = 75 g. Document Released: 10/02/2005 Document Revised: 02/16/2014 Document Reviewed: 08/29/2013 [facility] Patient Information 2015 [place], [place]. This information is not intended to  replace advice given to you by your health care provider. Make sure you discuss any questions you have with your health care provider. DASH Eating Plan DASH stands for "Dietary Approaches to Stop Hypertension." The DASH eating plan is a healthy eating plan that has been shown to reduce high blood pressure (hypertension). Additional health benefits  may include reducing the risk of type 2 diabetes mellitus, heart disease, and stroke. The DASH eating plan may also help with weight loss. WHAT DO I NEED TO KNOW ABOUT THE DASH EATING PLAN? For the DASH eating plan, you will follow these general guidelines:  Choose foods with a percent daily value for sodium of less than 5% (as listed on the food label).  Use salt-free seasonings or herbs instead of table salt or sea salt.  Check with your health care provider or pharmacist before using salt substitutes.  Eat lower-sodium products, often labeled as "lower sodium" or "no salt added."  Eat fresh foods.  Eat more vegetables, fruits, and low-fat dairy products.  Choose whole grains. Look for the word "whole" as the first word in the ingredient list.  Choose fish and skinless chicken or [place] more often than red meat. Limit fish, poultry, and meat to 6 oz (170 g) each day.  Limit sweets, desserts, sugars, and sugary drinks.  Choose heart-healthy fats.  Limit cheese to 1 oz (28 g) per day.  Eat more home-cooked food and less restaurant, buffet, and fast food.  Limit fried foods.  Cook foods using methods other than frying.  Limit canned vegetables. If you do use them, rinse them well to decrease the sodium.  When eating at a restaurant, ask that your food be prepared with less salt, or no salt if possible. WHAT FOODS CAN I EAT? Seek help from a dietitian for individual calorie needs. Grains Whole grain or whole wheat bread. Brown rice. Whole grain or whole wheat pasta. Quinoa, bulgur, and whole grain cereals. Low-sodium cereals. Corn or whole wheat flour tortillas. Whole grain cornbread. Whole grain crackers. Low-sodium crackers. Vegetables Fresh or frozen vegetables (raw, steamed, roasted, or grilled). Low-sodium or reduced-sodium tomato and vegetable juices. Low-sodium or reduced-sodium tomato sauce and paste. Low-sodium or reduced-sodium canned vegetables.  Fruits All fresh,  canned (in natural juice), or frozen fruits. Meat and Other Protein Products Ground beef (85% or leaner), grass-fed beef, or beef trimmed of fat. Skinless chicken or [place]. Ground chicken or [place]. Pork trimmed of fat. All fish and seafood. Eggs. Dried beans, peas, or lentils. Unsalted nuts and seeds. Unsalted canned beans. Dairy Low-fat dairy products, such as skim or 1% milk, 2% or reduced-fat cheeses, low-fat ricotta or cottage cheese, or plain low-fat yogurt. Low-sodium or reduced-sodium cheeses. Fats and Oils Tub margarines without trans fats. Light or reduced-fat mayonnaise and salad dressings (reduced sodium). Avocado. Safflower, olive, or canola oils. Natural peanut or almond butter. Other Unsalted popcorn and pretzels. The items listed above may not be a complete list of recommended foods or beverages. Contact your dietitian for more options. WHAT FOODS ARE NOT RECOMMENDED? Grains White bread. White pasta. White rice. Refined cornbread. Bagels and croissants. Crackers that contain trans fat. Vegetables Creamed or fried vegetables. Vegetables in a cheese sauce. Regular canned vegetables. Regular canned tomato sauce and paste. Regular tomato and vegetable juices. Fruits Dried fruits. Canned fruit in light or heavy syrup. Fruit juice. Meat and Other Protein Products Fatty cuts of meat. Ribs, chicken wings, bacon, sausage, bologna, salami, chitterlings, fatback, hot dogs, bratwurst, and packaged luncheon  meats. Salted nuts and seeds. Canned beans with salt. Dairy Whole or 2% milk, cream, half-and-half, and cream cheese. Whole-fat or sweetened yogurt. Full-fat cheeses or blue cheese. Nondairy creamers and whipped toppings. Processed cheese, cheese spreads, or cheese curds. Condiments Onion and garlic salt, seasoned salt, table salt, and sea salt. Canned and packaged gravies. Worcestershire sauce. Tartar sauce. Barbecue sauce. Teriyaki sauce. Soy sauce, including reduced sodium. Steak  sauce. Fish sauce. Oyster sauce. Cocktail sauce. Horseradish. Ketchup and mustard. Meat flavorings and tenderizers. Bouillon cubes. Hot sauce. Tabasco sauce. Marinades. Taco seasonings. Relishes. Fats and Oils Butter, stick margarine, lard, shortening, ghee, and bacon fat. Coconut, palm kernel, or palm oils. Regular salad dressings. Other Pickles and olives. Salted popcorn and pretzels. The items listed above may not be a complete list of foods and beverages to avoid. Contact your dietitian for more information. WHERE CAN I FIND MORE INFORMATION? National Heart, Lung, and Blood Institute: travelstabloid.com Document Released: 09/21/2011 Document Revised: 02/16/2014 Document Reviewed: 08/06/2013 [facility] Patient Information 2015 [place], [place]. This information is not intended to replace advice given to you by your health care provider. Make sure you discuss any questions you have with your health care provider.

## 2015-01-26 NOTE — Progress Notes (Addendum)
Patient presents for fasting lab and BP check Med list reviewed; states taking valsartan as directed Discussed need for low sodium diet and using Mrs. Dash as alternative to salt Encouraged to choose foods with 5% or less of daily value for sodium. Discussed walking 30 minutes per day for exercise Patient denies headaches, blurred vision, SHOB, chest pain or pressure  BP 133/81 P 69 R 16  T  98.1oral SPO2  97%  Wt 246.6 lb  Patient advised to call for med refills at least 7 days before running out so as not to go without.  Patient aware that he is to f/u with PCP 3 months from last visit (Due 04/22/15)  Patient given literature on DASH Eating Plan Fat and Cholesterol Control Diet Diabetes and Food, Diabetes and Exercise, Basic Carb Counting

## 2015-02-10 ENCOUNTER — Telehealth: Payer: Self-pay | Admitting: *Deleted

## 2015-02-10 NOTE — Telephone Encounter (Signed)
Pt's phone couldn't hear me speaking.

## 2015-02-10 NOTE — Telephone Encounter (Signed)
-----   Message from Lance Bosch, NP sent at 02/03/2015 12:07 AM EDT ----- Cholesterol is really elevated. Please send patient atorvastatin 20 mg to take daily. Please go over importance of cholesterol control and diet changes.

## 2015-02-18 ENCOUNTER — Ambulatory Visit: Payer: Self-pay | Attending: Internal Medicine

## 2015-02-18 ENCOUNTER — Telehealth: Payer: Self-pay | Admitting: Internal Medicine

## 2015-02-18 NOTE — Telephone Encounter (Signed)
Patient came into clinic requesting bloodwork results. Please f/u with patient

## 2015-02-24 ENCOUNTER — Telehealth: Payer: Self-pay | Admitting: *Deleted

## 2015-02-24 MED ORDER — ATORVASTATIN CALCIUM 20 MG PO TABS: 20.0000 mg | ORAL_TABLET | Freq: Every day | ORAL | Status: DC

## 2015-02-24 NOTE — Telephone Encounter (Signed)
Pt is aware of his lab results. 

## 2015-04-01 ENCOUNTER — Other Ambulatory Visit: Payer: Self-pay

## 2015-04-01 ENCOUNTER — Ambulatory Visit: Payer: Self-pay | Attending: Internal Medicine | Admitting: Internal Medicine

## 2015-04-01 ENCOUNTER — Encounter: Payer: Self-pay | Admitting: Internal Medicine

## 2015-04-01 VITALS — BP 162/100 | HR 72 | Wt 241.2 lb

## 2015-04-01 DIAGNOSIS — F1290 Cannabis use, unspecified, uncomplicated: Secondary | ICD-10-CM

## 2015-04-01 DIAGNOSIS — F419 Anxiety disorder, unspecified: Secondary | ICD-10-CM

## 2015-04-01 DIAGNOSIS — F1210 Cannabis abuse, uncomplicated: Secondary | ICD-10-CM

## 2015-04-01 DIAGNOSIS — I10 Essential (primary) hypertension: Secondary | ICD-10-CM

## 2015-04-01 MED ORDER — HYDROXYZINE HCL 25 MG PO TABS: 25.0000 mg | ORAL_TABLET | Freq: Two times a day (BID) | ORAL | Status: DC | PRN

## 2015-04-01 MED ORDER — VALSARTAN 160 MG PO TABS: 160.0000 mg | ORAL_TABLET | Freq: Every day | ORAL | Status: DC

## 2015-04-01 NOTE — Progress Notes (Signed)
  Pt is here to discuss Panic Attack.

## 2015-04-01 NOTE — Progress Notes (Signed)
Patient ID: Luis Henderson, male   DOB: April 19, 1956, 59 y.o.   MRN: [id number]  CC: anxiety  HPI: Luis Henderson is a 59 y.o. male here today for evaluation of anxiety symptoms.  Patient has past medical history of HTN, HLD, depression, anxiety, and diabetes.  Patient reports that he has not slept in two days. When he feels like he is close to falling asleep he gets symptoms of SOB and feeling like he is going to die. Symptoms have been building up over the past few days. He states that he was on SSRI's in the past but he stopped taking it shortly after because he felt like it was causing him to have erectile dysfunction. He has been on several anti-depressants since the 1970's. He admits to smoking marijuana that he grows at home. He does not feel like it is related to his symptoms today.   Right nostril is bleeding. He feels like he has a "tumor in his nose". He has pulled out a hard bloody scab and wants to know what is wrong with his nose.   Allergies  Allergen Reactions  . Codeine Itching  . Lipitor [Atorvastatin Calcium]     unknown  . Lisinopril Cough   Past Medical History  Diagnosis Date  . Hypertension   . Hyperlipidemia   . Depression   . Anxiety   . Diabetes mellitus    Current Outpatient Prescriptions on File Prior to Visit  Medication Sig Dispense Refill  . atorvastatin (LIPITOR) 20 MG tablet Take 1 tablet (20 mg total) by mouth daily. 90 tablet 3  . omeprazole (PRILOSEC) 20 MG capsule Take 1 capsule (20 mg total) by mouth daily. 14 capsule 0  . phenazopyridine (PYRIDIUM) 200 MG tablet Take 1 tablet (200 mg total) by mouth 3 (three) times daily as needed for pain. 30 tablet 0  . valsartan (DIOVAN) 80 MG tablet Take 1 tablet (80 mg total) by mouth daily. 30 tablet 3  . Oxycodone HCl 10 MG TABS Take 1 tablet (10 mg total) by mouth every 4 (four) hours as needed. (Patient not taking: Reported on 01/21/2015) 30 tablet 0  . oxyCODONE-acetaminophen (PERCOCET/ROXICET) 5-325 MG  per tablet Take 1-2 tablets by mouth every 6 (six) hours as needed for severe pain. (Patient not taking: Reported on 01/21/2015) 15 tablet 0   No current facility-administered medications on file prior to visit.   Family History  Problem Relation Age of Onset  . Hyperlipidemia Father    History   Social History  . Marital Status: Married    Spouse Name: N/A  . Number of Children: 2  . 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 Smoking status: Former Smoker -- 1.00 packs/day for 28 years    Types: Cigarettes    Quit date: 10/17/1999  . Smokeless tobacco: Never Used  . Alcohol Use: Yes  . Drug Use: No  . Sexual Activity: Not on file   Other Topics Concern  . Not on file   Social History Narrative    Review of Systems  Respiratory: Positive for shortness of breath. Negative for cough and wheezing.   Cardiovascular: Negative for chest pain and palpitations.  Neurological: Negative for dizziness and headaches.  Psychiatric/Behavioral: Positive for depression and substance abuse. Negative for suicidal ideas, hallucinations and memory loss. The patient is nervous/anxious and has insomnia.       Objective:  Filed Vitals:   04/01/15 1035  BP: 180/95  Pulse: 72    Physical Exam  Constitutional: He is oriented to person, place, and time. No distress.  Cardiovascular: Normal rate, regular rhythm and normal heart sounds.   No murmur heard. Pulmonary/Chest: Effort normal and breath sounds normal. He has no wheezes.  Neurological: He is alert and oriented to person, place, and time.  Skin: He is not diaphoretic.  Psychiatric: He has a normal mood and affect.   .  Lab Results  Component Value Date   WBC 10.5 01/13/2015   HGB 17.5* 01/13/2015   HCT 51.7 01/13/2015   MCV 88.2 01/13/2015   PLT 226 01/13/2015   Lab Results  Component Value Date   CREATININE 1.66* 01/13/2015   BUN 26* 01/13/2015    NA 136 01/13/2015   K 3.8 01/13/2015   CL 101 01/13/2015   CO2 23 01/13/2015    Lab Results  Component Value Date   HGBA1C 6.40 01/21/2015   Lipid Panel     Component Value Date/Time   CHOL 221* 01/26/2015 1134   TRIG 222* 01/26/2015 1134   HDL 31* 01/26/2015 1134   CHOLHDL 7.1 01/26/2015 1134   VLDL 44* 01/26/2015 1134   LDLCALC 146* 01/26/2015 1134       Assessment and plan:   Sevrin was seen today for panic attack and nasal congestion.  Diagnoses and all orders for this visit:  Anxiety Orders: -    Begin hydrOXYzine (ATARAX/VISTARIL) 25 MG tablet; Take 1 tablet (25 mg total) by mouth 2 (two) times daily as needed for anxiety. I will not send benzo's due to patient's history of marijuana use. If requires something stronger may begin on Buspar  Marijuana use Explained that marijuana may increase symptoms of anxiety, advised to quit asap  Essential hypertension Orders: -    Increased valsartan (DIOVAN) 160 MG tablet; Take 1 tablet (160 mg total) by mouth daily. Patients BP is still not controlled, will increase dose and bring him back in 2 weeks for review.  Dash diet, weight loss, exercise   Return in about 3 weeks (around 04/22/2015) for Nurse Visit-BP check and nose. 3 mo PCP .      Chari Manning, [facility] [phone] 04/01/2015, 10:37 AM

## 2015-04-01 NOTE — Patient Instructions (Addendum)
I have also increased your BP medication because you are not very controlled. I would like you back in 2-3 weeks for BP recheck and to check your nose.   Hydroxyzine capsules or tablets What is this medicine? HYDROXYZINE (hye [place] i zeen) is an antihistamine. This medicine is used to treat allergy symptoms. It is also used to treat anxiety and tension. This medicine can be used with other medicines to induce sleep before surgery. This medicine may be used for other purposes; ask your health care provider or pharmacist if you have questions. COMMON BRAND NAME(S): ANX, Atarax, Rezine, Vistaril What should I tell my health care provider before I take this medicine? They need to know if you have any of these conditions: -any chronic illness -difficulty passing urine -glaucoma -heart disease -kidney disease -liver disease -lung disease -an unusual or allergic reaction to hydroxyzine, cetirizine, other medicines, foods, dyes, or preservatives -pregnant or trying to get pregnant -breast-feeding How should I use this medicine? Take this medicine by mouth with a full glass of water. Follow the directions on the prescription label. You may take this medicine with food or on an empty stomach. Take your medicine at regular intervals. Do not take your medicine more often than directed. Talk to your pediatrician regarding the use of this medicine in children. Special care may be needed. While this drug may be prescribed for children as young as 76 years of age for selected conditions, precautions do apply. Patients over 64 years old may have a stronger reaction and need a smaller dose. Overdosage: If you think you have taken too much of this medicine contact a poison control center or emergency room at once. NOTE: This medicine is only for you. Do not share this medicine with others. What if I miss a dose? If you miss a dose, take it as soon as you can. If it is almost time for your next dose, take only  that dose. Do not take double or extra doses. What may interact with this medicine? -alcohol -barbiturate medicines for sleep or seizures -medicines for colds, allergies -medicines for depression, anxiety, or emotional disturbances -medicines for pain -medicines for sleep -muscle relaxants This list may not describe all possible interactions. Give your health care provider a list of all the medicines, herbs, non-prescription drugs, or dietary supplements you use. Also tell them if you smoke, drink alcohol, or use illegal drugs. Some items may interact with your medicine. What should I watch for while using this medicine? Tell your doctor or health care professional if your symptoms do not improve. You may get drowsy or dizzy. Do not drive, use machinery, or do anything that needs mental alertness until you know how this medicine affects you. Do not stand or sit up quickly, especially if you are an older patient. This reduces the risk of dizzy or fainting spells. Alcohol may interfere with the effect of this medicine. Avoid alcoholic drinks. Your mouth may get dry. Chewing sugarless gum or sucking hard candy, and drinking plenty of water may help. Contact your doctor if the problem does not go away or is severe. This medicine may cause dry eyes and blurred vision. If you wear contact lenses you may feel some discomfort. Lubricating drops may help. See your eye doctor if the problem does not go away or is severe. If you are receiving skin tests for allergies, tell your doctor you are using this medicine. What side effects may I notice from receiving this medicine? Side effects  that you should report to your doctor or health care professional as soon as possible: -fast or irregular heartbeat -difficulty passing urine -seizures -slurred speech or confusion -tremor Side effects that usually do not require medical attention (report to your doctor or health care professional if they continue or are  bothersome): -constipation -drowsiness -fatigue -headache -stomach upset This list may not describe all possible side effects. Call your doctor for medical advice about side effects. You may report side effects to FDA at 1-800-FDA-1088. Where should I keep my medicine? Keep out of the reach of children. Store at room temperature between 15 and 30 degrees C (59 and 86 degrees F). Keep container tightly closed. Throw away any unused medicine after the expiration date. NOTE: This sheet is a summary. It may not cover all possible information. If you have questions about this medicine, talk to your doctor, pharmacist, or health care provider.  2015, Elsevier/Gold Standard. (2008-02-14 14:50:59)

## 2015-04-22 ENCOUNTER — Encounter: Payer: Self-pay | Admitting: Internal Medicine

## 2015-04-22 ENCOUNTER — Ambulatory Visit: Payer: Self-pay | Attending: Internal Medicine | Admitting: Internal Medicine

## 2015-04-22 VITALS — BP 124/74 | HR 80 | Temp 98.8°F | Ht 71.0 in | Wt 226.0 lb

## 2015-04-22 DIAGNOSIS — I10 Essential (primary) hypertension: Secondary | ICD-10-CM | POA: Insufficient documentation

## 2015-04-22 DIAGNOSIS — Z79899 Other long term (current) drug therapy: Secondary | ICD-10-CM | POA: Insufficient documentation

## 2015-04-22 DIAGNOSIS — K047 Periapical abscess without sinus: Secondary | ICD-10-CM | POA: Insufficient documentation

## 2015-04-22 MED ORDER — VALSARTAN 160 MG PO TABS: 160.0000 mg | ORAL_TABLET | Freq: Every day | ORAL | Status: DC

## 2015-04-22 MED ORDER — AMOXICILLIN 500 MG PO CAPS: 500.0000 mg | ORAL_CAPSULE | Freq: Three times a day (TID) | ORAL | Status: DC

## 2015-04-22 NOTE — Progress Notes (Signed)
Patient ID: Luis Henderson, male   DOB: 1956/05/06, 59 y.o.   MRN: [id number] Subjective:  Luis Henderson is a 59 y.o. male with hypertension. Patient reports that he is recovering from a GI illness that he had 2 days ago. He reports that he does feel better but is trying to eat slow. He is concerned about a fluid filled area in his left upper gum that has been swollen. He reports that he intermittently get this fluid filled bump every few weeks. He has not been to the dentist in several years. Of note patient has lost 15 pounds in one month and has cut back to drinking no more than 2 beers per day.   Current Outpatient Prescriptions  Medication Sig Dispense Refill  . hydrOXYzine (ATARAX/VISTARIL) 25 MG tablet Take 1 tablet (25 mg total) by mouth 2 (two) times daily as needed for anxiety. 30 tablet 1  . phenazopyridine (PYRIDIUM) 200 MG tablet Take 1 tablet (200 mg total) by mouth 3 (three) times daily as needed for pain. 30 tablet 0  . valsartan (DIOVAN) 160 MG tablet Take 1 tablet (160 mg total) by mouth daily. 30 tablet 4   No current facility-administered medications for this visit.    Hypertension ROS: taking medications as instructed, no medication side effects noted, no TIA's, no chest pain on exertion, no dyspnea on exertion and no swelling of ankles.   Objective:  BP 124/74 mmHg  Pulse 80  Temp(Src) 98.8 F (37.1 C)  Ht 5\' 11"  (1.803 m)  Wt 226 lb (102.513 kg)  BMI 31.53 kg/m2  SpO2 97%  Appearance alert, well appearing, and in no distress, oriented to person, place, and time and overweight. General exam BP noted to be well controlled today in office, S1, S2 normal, no gallop, no murmur, chest clear, no JVD, no HSM, no edema. Swollen, erythematous gums  Lab review: labs are reviewed, up to date and normal.   Assessment:   Hypertension--Patient BP has greatly improved since last visit on increased Valsartan dose  Dental infection--recommend dental visit asap and amoxicillin  given   Plan:  Current treatment plan is effective, no change in therapy. Reviewed diet, exercise and weight control. Recommended sodium restriction..  Return in about 6 months (around 10/23/2015) for Hypertension.  Lance Bosch, NP 05/02/2015 7:26 PM

## 2015-04-22 NOTE — Progress Notes (Signed)
Patient here to follow up on his blood pressure He reports GI illness over last two days but is starting to recover He needs refills

## 2015-04-22 NOTE — Patient Instructions (Signed)
Congrats on your weight loss!!! Keep up the great work.

## 2015-07-14 ENCOUNTER — Ambulatory Visit: Payer: Self-pay | Attending: Internal Medicine | Admitting: Pharmacist

## 2015-07-14 DIAGNOSIS — Z23 Encounter for immunization: Secondary | ICD-10-CM | POA: Insufficient documentation

## 2015-07-14 MED ORDER — INFLUENZA VAC SPLIT QUAD 0.5 ML IM SUSY
0.5000 mL | PREFILLED_SYRINGE | Freq: Once | INTRAMUSCULAR | Status: AC
  Administered 2015-07-14: 0.5 mL via INTRAMUSCULAR

## 2015-07-14 NOTE — Patient Instructions (Signed)

## 2015-08-02 ENCOUNTER — Ambulatory Visit: Payer: Self-pay | Attending: Internal Medicine | Admitting: Internal Medicine

## 2015-08-02 ENCOUNTER — Encounter: Payer: Self-pay | Admitting: Internal Medicine

## 2015-08-02 VITALS — BP 163/92 | HR 77 | Temp 98.0°F | Resp 16 | Ht 71.0 in | Wt 224.0 lb

## 2015-08-02 DIAGNOSIS — J3489 Other specified disorders of nose and nasal sinuses: Secondary | ICD-10-CM

## 2015-08-02 DIAGNOSIS — E785 Hyperlipidemia, unspecified: Secondary | ICD-10-CM | POA: Insufficient documentation

## 2015-08-02 DIAGNOSIS — K069 Disorder of gingiva and edentulous alveolar ridge, unspecified: Secondary | ICD-10-CM

## 2015-08-02 DIAGNOSIS — K0889 Other specified disorders of teeth and supporting structures: Secondary | ICD-10-CM

## 2015-08-02 DIAGNOSIS — Z23 Encounter for immunization: Secondary | ICD-10-CM

## 2015-08-02 DIAGNOSIS — R7303 Prediabetes: Secondary | ICD-10-CM

## 2015-08-02 DIAGNOSIS — Z87891 Personal history of nicotine dependence: Secondary | ICD-10-CM | POA: Insufficient documentation

## 2015-08-02 DIAGNOSIS — R239 Unspecified skin changes: Secondary | ICD-10-CM

## 2015-08-02 DIAGNOSIS — I10 Essential (primary) hypertension: Secondary | ICD-10-CM

## 2015-08-02 DIAGNOSIS — R234 Changes in skin texture: Secondary | ICD-10-CM | POA: Insufficient documentation

## 2015-08-02 DIAGNOSIS — Z79899 Other long term (current) drug therapy: Secondary | ICD-10-CM | POA: Insufficient documentation

## 2015-08-02 LAB — POCT GLYCOSYLATED HEMOGLOBIN (HGB A1C): Hemoglobin A1C: 6.2

## 2015-08-02 NOTE — Progress Notes (Signed)
Patient complains of his teeth breaking and falling out Patient also complains of having a abscess to his top gums on his left side Patient also complains of a red area to his left check that he states has gotten bigger

## 2015-08-02 NOTE — Progress Notes (Signed)
Patient ID: Luis Henderson, male   DOB: 01/24/1956, 59 y.o.   MRN: [id number]  CC: gum pain, skin concern  HPI: Luis Henderson is a 59 y.o. male here today for a follow up visit.  Patient has past medical history of HTN, HLD, and prediabetes. Patient reports that he has still has been having the same concern of painful broken teeth. He states that the fluid filled cyst has reoccurred on the top left side of his gums and is painful. Patient has been unable to get to the dentist due to the lack of money and insurance. He is requesting that the cyst is "popped and cultured".  He reports a red area of skin that has been irritated for the past couple of weeks. He is concerned about skin cancer. He does not wear sun block.  He is still concerned about a scab in his nose that will not go away. He wants referral to ENT.  Patient has No headache, No chest pain, No abdominal pain - No Nausea, No new weakness tingling or numbness, No Cough - SOB.  Allergies  Allergen Reactions  . Codeine Itching  . Lipitor [Atorvastatin Calcium]     unknown  . Lisinopril Cough   Past Medical History  Diagnosis Date  . Hypertension   . Hyperlipidemia   . Depression   . Anxiety   . Diabetes mellitus    Current Outpatient Prescriptions on File Prior to Visit  Medication Sig Dispense Refill  . hydrOXYzine (ATARAX/VISTARIL) 25 MG tablet Take 1 tablet (25 mg total) by mouth 2 (two) times daily as needed for anxiety. 30 tablet 1  . valsartan (DIOVAN) 160 MG tablet Take 1 tablet (160 mg total) by mouth daily. 30 tablet 4  . amoxicillin (AMOXIL) 500 MG capsule Take 1 capsule (500 mg total) by mouth 3 (three) times daily. 21 capsule 0  . phenazopyridine (PYRIDIUM) 200 MG tablet Take 1 tablet (200 mg total) by mouth 3 (three) times daily as needed for pain. 30 tablet 0   No current facility-administered medications on file prior to visit.   Family History  Problem Relation Age of Onset  . Hyperlipidemia Father     Social History   Social History  . Marital Status: Married    Spouse Name: N/A  . Number of Children: 2  . 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 Smoking status: Former Smoker -- 1.00 packs/day for 28 years    Types: Cigarettes    Quit date: 10/17/1999  . Smokeless tobacco: Never Used  . Alcohol Use: Yes     Comment: socially  . Drug Use: Yes    Special: Marijuana  . Sexual Activity: Not on file   Other Topics Concern  . Not on file   Social History Narrative    Review of Systems: Other than what is stated in HPI, all other systems are negative.   Objective:   Filed Vitals:   08/02/15 1208  BP: 163/92  Pulse: 77  Temp: 98 F (36.7 C)  Resp: 16    Physical Exam  Constitutional: He is oriented to person, place, and time.  HENT:  Head:    Mouth/Throat: Dental caries present.  Small cyst on top upper left. Small irritated area of skin  Cardiovascular: Normal rate, regular rhythm and normal heart sounds.   Pulmonary/Chest: Effort normal and breath sounds normal.  Neurological: He  is alert and oriented to person, place, and time.  Skin: Skin is warm and dry.     Lab Results  Component Value Date   WBC 10.5 01/13/2015   HGB 17.5* 01/13/2015   HCT 51.7 01/13/2015   MCV 88.2 01/13/2015   PLT 226 01/13/2015   Lab Results  Component Value Date   CREATININE 1.66* 01/13/2015   BUN 26* 01/13/2015   NA 136 01/13/2015   K 3.8 01/13/2015   CL 101 01/13/2015   CO2 23 01/13/2015    Lab Results  Component Value Date   HGBA1C 6.20 08/02/2015   Lipid Panel     Component Value Date/Time   CHOL 221* 01/26/2015 1134   TRIG 222* 01/26/2015 1134   HDL 31* 01/26/2015 1134   CHOLHDL 7.1 01/26/2015 1134   VLDL 44* 01/26/2015 1134   LDLCALC 146* 01/26/2015 1134       Assessment and plan:   Luis Henderson was seen today for dental pain.  Diagnoses and all orders for this  visit:  Pain, dental/Gum disease -     Ambulatory referral to Dentistry Patient became angry when I explained to him that I will not "pop" the cyst in his mouth. I have explained that he needs the attention of a dentist for that procedure and he likely has gum disease and will need tooth extraction.  List of low cost dental contacts given  Essential hypertension Patient is very upset during history and reports that he is under a lot of stress. He states that he did take his BP medication earlier today. I will continue to monitor patients pressure on follow up   Skin change -     Ambulatory referral to Dermatology Will refer for a skin biopsy  Nose irritation -     Ambulatory referral to ENT I have been unable to visualize scab in nose. He will need ENT to look further inside his nose  Prediabetes -     POCT glycosylated hemoglobin (Hb A1C)--6.2%  Need for prophylactic vaccination against Streptococcus pneumoniae (pneumococcus) -     Pneumococcal polysaccharide vaccine 23-valent greater than or equal to 2yo subcutaneous/IM   Return in about 3 months (around 11/02/2015) for Hypertension.        Lance Bosch, [facility] [phone] 08/02/2015, 11:29 AM

## 2015-11-10 ENCOUNTER — Encounter (HOSPITAL_COMMUNITY): Payer: Self-pay | Admitting: Emergency Medicine

## 2015-11-10 ENCOUNTER — Emergency Department (INDEPENDENT_AMBULATORY_CARE_PROVIDER_SITE_OTHER)
Admission: EM | Admit: 2015-11-10 | Discharge: 2015-11-10 | Disposition: A | Discharge status: Home / Self Care | Payer: Self-pay | Source: Home / Self Care | Attending: Emergency Medicine | Admitting: Emergency Medicine

## 2015-11-10 DIAGNOSIS — F419 Anxiety disorder, unspecified: Secondary | ICD-10-CM

## 2015-11-10 DIAGNOSIS — R454 Irritability and anger: Secondary | ICD-10-CM

## 2015-11-10 LAB — TSH: TSH: 2.701 u[IU]/mL (ref 0.350–4.500)

## 2015-11-10 MED ORDER — BUSPIRONE HCL 7.5 MG PO TABS: 7.5000 mg | ORAL_TABLET | Freq: Two times a day (BID) | ORAL | Status: DC

## 2015-11-10 NOTE — ED Notes (Signed)
Patient reports anxiety and panic attack.  Patient feels angry.  No particular incident today.

## 2015-11-10 NOTE — ED Provider Notes (Signed)
CSN: [id number]     Arrival date & time 11/10/15  1726 History   First MD Initiated Contact with Patient 11/10/15 1800     Chief Complaint  Patient presents with  . Panic Attack  . Anxiety   (Consider location/radiation/quality/duration/timing/severity/associated sxs/prior Treatment) HPI  He is a 60 year old man here for evaluation of anxiety and anger issues. He states for the last several weeks he has been having difficulty with anxiety and controlling outbursts. He reports getting very easily frustrated and angry. He is now on a drive currently due to road rage issues. He denies any suicidal or homicidal ideation. He has been experiencing increased heartburn symptoms as well as some nausea. No fevers or chills. He does have a history of anxiety when he was a teenager. He has not been on any medication for quite some time. He is also anxious as his blood pressure and pulse have been elevated, despite taking his hypertension medication.  Past Medical History  Diagnosis Date  . Hypertension   . Hyperlipidemia   . Depression   . Anxiety   . Diabetes mellitus    Past Surgical History  Procedure Laterality Date  . Wisdom tooth extraction    . Skin graft      left hand  . Cystoscopy with retrograde pyelogram, ureteroscopy and stent placement Right 01/15/2015    Procedure: CYSTOSCOPY, RIGHT URETEROSCOPY/ RETROGRADE PYELOGRAM,HOLMIUM LASER/ LITHOTRIPSY, STENT PLACEMENT, URETERAL BALLOON DIALATION;  Surgeon: Kathie Rhodes, MD;  Location: [facility];  Service: Urology;  Laterality: Right;   Family History  Problem Relation Age of Onset  . Hyperlipidemia Father    Social History  Substance Use Topics  . Smoking status: Former Smoker -- 1.00 packs/day for 28 years    Types: Cigarettes    Quit date: 10/17/1999  . Smokeless tobacco: Never Used  . Alcohol Use: Yes     Comment: socially    Review of Systems As in history of present illness Allergies  Codeine; Lipitor; and Lisinopril  Home  Medications   Prior to Admission medications   Medication Sig Start Date End Date Taking? Authorizing Provider  aspirin 81 MG tablet Take 81 mg by mouth daily.   Yes Historical Provider, MD  busPIRone (BUSPAR) 7.5 MG tablet Take 1 tablet (7.5 mg total) by mouth 2 (two) times daily. 11/10/15   Melony Overly, MD  valsartan (DIOVAN) 160 MG tablet Take 1 tablet (160 mg total) by mouth daily. 04/22/15   Lance Bosch, NP   Meds Ordered and Administered this Visit  Medications - No data to display  BP 165/110 mmHg  Pulse 112  Temp(Src) 99.6 F (37.6 C) (Oral)  Resp 16  SpO2 98% No data found.   Physical Exam  Constitutional: He is oriented to person, place, and time. He appears well-developed and well-nourished. He appears distressed (appears agitated and anxious ).  Neck: Neck supple. No thyromegaly present.  Cardiovascular: Normal rate, regular rhythm and normal heart sounds.   No murmur heard. Pulmonary/Chest: Effort normal and breath sounds normal. No respiratory distress. He has no wheezes. He has no rales.  Neurological: He is alert and oriented to person, place, and time.  Psychiatric: Judgment and thought content normal. His mood appears anxious. His affect is angry. His speech is not rapid and/or pressured and not tangential. He is agitated. He is not aggressive and not actively hallucinating.    ED Course  Procedures (including critical care time)  Labs Review Labs Reviewed  TSH  Imaging Review No results found.    MDM   1. Anxiety   2. Difficulty controlling anger    We will check a thyroid level today to make sure this is not due to hyperthyroidism. I suspect his symptoms are more likely psychiatric in origin given history of anxiety and depression in the past. We discussed multiple options including transfer to the emergency room. As he is not currently suicidal or homicidal, I do not feel that this is required at this time. I will start him on BuSpar to help  with anxiety. I have recommended he follow-up with his primary care doctor in regards to his hypertension. I also gave him the information for [facility] for mental health evaluation. If anytime he develops suicidal or homicidal thoughts, he should go directly to [facility] emergency room.    Melony Overly, MD 11/10/15 [phone]

## 2015-11-10 NOTE — Discharge Instructions (Signed)
We are going to make sure your thyroid level is okay today. We will call if this result is abnormal. Please start BuSpar 1 pill twice a day. This is to help with anxiety and anger management. Follow-up with your primary care doctor to see how your blood pressure is doing. Please call [facility] to see about an appointment with them for mental health evaluation. If at any time you are concerned about hurting yourself or someone else, please go to the [facility] for immediate evaluation.

## 2015-11-15 MED FILL — ?ATORVASTATIN 20 MG TABLET: 20 | 30 days supply | Qty: 30 | Fill #5

## 2015-11-15 MED FILL — VALSARTAN 160 MG TABLET: 160 | 30 days supply | Qty: 30 | Fill #1

## 2015-12-22 MED FILL — VALSARTAN 160 MG TABLET: 160 | 30 days supply | Qty: 30 | Fill #2

## 2015-12-22 MED FILL — ?ATORVASTATIN 20 MG TABLET: 20 | 30 days supply | Qty: 30 | Fill #6

## 2015-12-30 ENCOUNTER — Encounter (HOSPITAL_COMMUNITY): Payer: Self-pay | Admitting: *Deleted

## 2015-12-30 ENCOUNTER — Emergency Department (HOSPITAL_COMMUNITY)
Admission: EM | Admit: 2015-12-30 | Discharge: 2015-12-30 | Disposition: A | Discharge status: Home / Self Care | Payer: Self-pay | Source: Home / Self Care | Attending: Family Medicine | Admitting: Family Medicine

## 2015-12-30 DIAGNOSIS — D751 Secondary polycythemia: Secondary | ICD-10-CM

## 2015-12-30 DIAGNOSIS — I10 Essential (primary) hypertension: Secondary | ICD-10-CM

## 2015-12-30 LAB — POCT I-STAT, CHEM 8
BUN: 18 mg/dL (ref 6–20)
CALCIUM ION: 1.24 mmol/L (ref 1.13–1.30)
CHLORIDE: 100 mmol/L — AB (ref 101–111)
CREATININE: 1 mg/dL (ref 0.61–1.24)
GLUCOSE: 123 mg/dL — AB (ref 65–99)
HCT: 52 % (ref 39.0–52.0)
Hemoglobin: 17.7 g/dL — ABNORMAL HIGH (ref 13.0–17.0)
POTASSIUM: 4 mmol/L (ref 3.5–5.1)
Sodium: 139 mmol/L (ref 135–145)
TCO2: 28 mmol/L (ref 0–100)

## 2015-12-30 MED ORDER — FLUTICASONE PROPIONATE 0.05 % EX CREA: TOPICAL_CREAM | Freq: Two times a day (BID) | CUTANEOUS | Status: DC

## 2015-12-30 MED ORDER — AMLODIPINE BESYLATE 10 MG PO TABS: 10.0000 mg | ORAL_TABLET | Freq: Every day | ORAL | Status: DC

## 2015-12-30 NOTE — Discharge Instructions (Signed)
Use medicine along with valsartin and see your doctor in 2 weeks to re-evaluate.

## 2015-12-30 NOTE — ED Provider Notes (Signed)
CSN: [id number]     Arrival date & time 12/30/15  1304 History   First MD Initiated Contact with Patient 12/30/15 1359     Chief Complaint  Patient presents with  . Dizziness   (Consider location/radiation/quality/duration/timing/severity/associated sxs/prior Treatment) Patient is a 60 y.o. male presenting with dizziness. The history is provided by the patient.  Dizziness Quality:  Lightheadedness Severity:  Mild Onset quality:  Gradual Duration:  3 days Progression:  Unchanged Chronicity:  New Context: head movement   Relieved by:  Being still Associated symptoms: no chest pain, no headaches, no nausea, no palpitations, no vomiting and no weakness   Risk factors comment:  Pt is smoker, out of work, depressed, just don't care.   Past Medical History  Diagnosis Date  . Hypertension   . Hyperlipidemia   . Depression   . Anxiety   . Diabetes mellitus    Past Surgical History  Procedure Laterality Date  . Wisdom tooth extraction    . Skin graft      left hand  . Cystoscopy with retrograde pyelogram, ureteroscopy and stent placement Right 01/15/2015    Procedure: CYSTOSCOPY, RIGHT URETEROSCOPY/ RETROGRADE PYELOGRAM,HOLMIUM LASER/ LITHOTRIPSY, STENT PLACEMENT, URETERAL BALLOON DIALATION;  Surgeon: Kathie Rhodes, MD;  Location: [facility];  Service: Urology;  Laterality: Right;   Family History  Problem Relation Age of Onset  . Hyperlipidemia Father    Social History  Substance Use Topics  . Smoking status: Former Smoker -- 1.00 packs/day for 28 years    Types: Cigarettes    Quit date: 10/17/1999  . Smokeless tobacco: Never Used  . Alcohol Use: Yes     Comment: socially    Review of Systems  Constitutional: Negative.   HENT: Negative.   Respiratory: Negative.   Cardiovascular: Negative.  Negative for chest pain and palpitations.  Gastrointestinal: Negative for nausea and vomiting.  Neurological: Positive for dizziness. Negative for weakness and headaches.  All other  systems reviewed and are negative.   Allergies  Codeine; Lipitor; and Lisinopril  Home Medications   Prior to Admission medications   Medication Sig Start Date End Date Taking? Authorizing Provider  aspirin 81 MG tablet Take 81 mg by mouth daily.    Historical Provider, MD  busPIRone (BUSPAR) 7.5 MG tablet Take 1 tablet (7.5 mg total) by mouth 2 (two) times daily. 11/10/15   Melony Overly, MD  valsartan (DIOVAN) 160 MG tablet Take 1 tablet (160 mg total) by mouth daily. 04/22/15   Lance Bosch, NP   Meds Ordered and Administered this Visit  Medications - No data to display  BP 195/109 mmHg  Pulse 74  Temp(Src) 99.2 F (37.3 C) (Oral)  Resp 18  SpO2 98% No data found.   Physical Exam  ED Course  Procedures (including critical care time)  Labs Review Labs Reviewed - No data to display i-stat as noted.  Imaging Review No results found.   Visual Acuity Review  Right Eye Distance:   Left Eye Distance:   Bilateral Distance:    Right Eye Near:   Left Eye Near:    Bilateral Near:         MDM  No diagnosis found.  Meds ordered this encounter  Medications  . amLODipine (NORVASC) 10 MG tablet    Sig: Take 1 tablet (10 mg total) by mouth daily.    Dispense:  30 tablet    Refill:  1      Billy Fischer, MD 12/30/15 [phone]

## 2015-12-30 NOTE — ED Notes (Signed)
Pt  Reports     His  Blood  Pressure  Was  Elevated       yest   When it  Was  Taken      Pt   Repots   He  [employer] of  Bed  3  Days  Ago    And  He has  Been  Dizzy  As  Well

## 2016-01-10 ENCOUNTER — Ambulatory Visit: Payer: Self-pay | Attending: Internal Medicine

## 2016-01-12 ENCOUNTER — Ambulatory Visit: Payer: Self-pay | Attending: Internal Medicine | Admitting: Clinical

## 2016-01-12 DIAGNOSIS — F329 Major depressive disorder, single episode, unspecified: Secondary | ICD-10-CM

## 2016-01-12 DIAGNOSIS — F419 Anxiety disorder, unspecified: Principal | ICD-10-CM

## 2016-01-12 DIAGNOSIS — F418 Other specified anxiety disorders: Secondary | ICD-10-CM

## 2016-01-12 NOTE — Progress Notes (Signed)
ASSESSMENT: Pt currently experiencing symptoms of anxiety and depression. Pt needs to f/u with PCP and [facility]. Pt would benefit from establishing care with psychiatry, as well as psychoeducation and brief therapeutic interventions regarding coping with symptoms of anxiety and depression. Pt may benefit from community resources. Stage of Change: contemplative  PLAN: 1. F/U with behavioral health consultant in one week 2. Psychiatric Medications: none 3. Behavioral recommendation(s):   -F/U with PCP  -Consider making appointment with either [facility], [facility], or [facility] -Consider reading educational materials regarding coping with symptoms of anxiety and depression -Consider mobile crisis number, as needed -Consider [facility] for intake, housing application, computer lab and disability lawyers  SUBJECTIVE: Pt. referred by Chari Manning, NP for symptoms of depression and anxiety:  Pt. reports the following symptoms/concerns: Pt states that his primary concern is lack of hope, feeling depressed, and getting easily irritated; he does not want to continue with negative verbal reactions to his elderly mother. He requests antidepressant to help cope with feelings of depression; attempts to cope with writing, but experiencing existential questioning concerning purpose. Some distress at thought over instable housing and unemployment, as he lives with mother. Pt expresses no SI, no HI, no plan.  Duration of problem: At least two months Severity: severe  OBJECTIVE: Orientation & Cognition: Oriented x3. Thought processes normal and appropriate to situation. Mood: low, anxious. Affect: appropriate Appearance: appropriate Risk of harm to self or others: no known risk of harm to self or others Substance use: alcohol Assessments administered: none today(administer at next visit)  Diagnosis: Anxiety and depression CPT Code: F41.8 -------------------------------------------- Other(s) present in the room:  none  Time spent with patient in exam room: 60 minutes, 3:45-4:45pm   Depression screen [facility] 2/9 04/22/2015 04/01/2015  Decreased Interest 0 0  Down, Depressed, Hopeless 0 0  PHQ - 2 Score 0 0    No flowsheet data found.

## 2016-01-17 ENCOUNTER — Ambulatory Visit: Payer: Self-pay | Attending: Internal Medicine | Admitting: Clinical

## 2016-01-17 DIAGNOSIS — F329 Major depressive disorder, single episode, unspecified: Secondary | ICD-10-CM

## 2016-01-17 DIAGNOSIS — F418 Other specified anxiety disorders: Secondary | ICD-10-CM

## 2016-01-17 DIAGNOSIS — F32A Depression, unspecified: Secondary | ICD-10-CM

## 2016-01-17 DIAGNOSIS — F419 Anxiety disorder, unspecified: Principal | ICD-10-CM

## 2016-01-17 NOTE — Progress Notes (Signed)
ASSESSMENT: Pt currently experiencing decrease in symptoms of anxiety and depression, not in remission. Pt needs to f/u with PCP and establish care with psychiatry. Pt would benefit from brief therapeutic intervention regarding coping with continued symptoms of anxiety and depression.  Stage of Change: contemplative  PLAN: 1. F/U with behavioral health consultant in as needed 2. Psychiatric Medications: none 3. Behavioral recommendation(s):   -Continue to consider appointment with [facility], [facility] or [facility] considering mobile crisis number, as needed -Consider [facility] for intake, housing application, computer lab and disability lawyers -Continue to put in applications for new positions  SUBJECTIVE: Pt. referred by  Chari Manning, NP for symptoms of depression and anxiety:  Pt. reports the following symptoms/concerns: Pt states that he is feeling better today, has put in job applications and feeling positive and hopeful about getting back to work,would like to be a patient advocate; some anxiousness about unknown.  Duration of problem: At least three days Severity: mild  OBJECTIVE: Orientation & Cognition: Oriented x3. Thought processes normal and appropriate to situation. Mood: appropriate (mood lifted since last visit). Affect: appropriate Appearance: appropriate Risk of harm to self or others: no known risk of harm to  Substance use: alcohol Assessments administered: client declined, said "it doesn't apply today"  Diagnosis: Anxiety and depression CPT Code: F41.8 -------------------------------------------- Other(s) present in the room: none  Time spent with patient in exam room: 16 minutes, 10:40-10:56am   Depression screen [facility] 2/9 04/22/2015 04/01/2015  Decreased Interest 0 0  Down, Depressed, Hopeless 0 0  PHQ - 2 Score 0 0

## 2016-01-20 ENCOUNTER — Encounter: Payer: Self-pay | Admitting: Internal Medicine

## 2016-01-20 ENCOUNTER — Ambulatory Visit: Payer: Self-pay | Attending: Internal Medicine | Admitting: Internal Medicine

## 2016-01-20 VITALS — BP 132/80 | HR 71 | Temp 98.1°F | Resp 18 | Ht 71.0 in | Wt 227.8 lb

## 2016-01-20 DIAGNOSIS — F329 Major depressive disorder, single episode, unspecified: Secondary | ICD-10-CM | POA: Insufficient documentation

## 2016-01-20 DIAGNOSIS — F32A Depression, unspecified: Secondary | ICD-10-CM

## 2016-01-20 DIAGNOSIS — I10 Essential (primary) hypertension: Secondary | ICD-10-CM | POA: Insufficient documentation

## 2016-01-20 DIAGNOSIS — Z7982 Long term (current) use of aspirin: Secondary | ICD-10-CM | POA: Insufficient documentation

## 2016-01-20 DIAGNOSIS — Z79899 Other long term (current) drug therapy: Secondary | ICD-10-CM | POA: Insufficient documentation

## 2016-01-20 DIAGNOSIS — F419 Anxiety disorder, unspecified: Secondary | ICD-10-CM | POA: Insufficient documentation

## 2016-01-20 MED ORDER — VALSARTAN 160 MG PO TABS: 160.0000 mg | ORAL_TABLET | Freq: Every day | ORAL | Status: DC

## 2016-01-20 MED ORDER — BUSPIRONE HCL 7.5 MG PO TABS: 7.5000 mg | ORAL_TABLET | Freq: Two times a day (BID) | ORAL | Status: DC

## 2016-01-20 MED ORDER — AMLODIPINE BESYLATE 10 MG PO TABS: 10.0000 mg | ORAL_TABLET | Freq: Every day | ORAL | Status: DC

## 2016-01-20 MED FILL — ?AMLODIPINE BESYLATE 10 MG: 10 | 30 days supply | Qty: 30 | Fill #0

## 2016-01-20 MED FILL — VALSARTAN 160 MG TABLET: 160 | 30 days supply | Qty: 30 | Fill #0

## 2016-01-20 MED FILL — busPIRone HCL 7.5 MG TABS: 7.5 | 30 days supply | Qty: 60 | Fill #0

## 2016-01-20 NOTE — Progress Notes (Signed)
Patient's here c/o of depression.  Patient states that he was on anti-depression in the past, but his sxs has gotten worse.  Patient requesting refills on Amlodipine, Buspirone.

## 2016-01-20 NOTE — Progress Notes (Signed)
Patient ID: Luis Henderson, male   DOB: Jan 12, 1956, 60 y.o.   MRN: [id number] Subjective:  Luis Henderson is a 60 y.o. male with hypertension. Patient reports that he has been suffering from increasingly worse depression. He states that he has been seeing the LCSW here for counseling but has yet to go to a psychiatrist for proper management. He states that 2 months ago he went to Urgent care for anxiety and difficulty managing his emotions and was placed on Buspar but has since ran out. He reports getting angered easily and feeling frustrated very often. While on Buspar he notes that he felt slightly better and would like a refill of this medication. Patient is also asking for a anti-depressant he seen on TV because several other SSRI's did not work for him in the past. He denies suicidal ideations today and does not have access to a firearm in his home.  Current Outpatient Prescriptions  Medication Sig Dispense Refill  . amLODipine (NORVASC) 10 MG tablet Take 1 tablet (10 mg total) by mouth daily. 30 tablet 1  . aspirin 81 MG tablet Take 81 mg by mouth daily.    . busPIRone (BUSPAR) 7.5 MG tablet Take 1 tablet (7.5 mg total) by mouth 2 (two) times daily. 60 tablet 0  . fluticasone (CUTIVATE) 0.05 % cream Apply topically 2 (two) times daily. 30 g 0  . valsartan (DIOVAN) 160 MG tablet Take 1 tablet (160 mg total) by mouth daily. 30 tablet 4   No current facility-administered medications for this visit.    Hypertension ROS: taking medications as instructed, no medication side effects noted, no TIA's, no chest pain on exertion, no dyspnea on exertion, no swelling of ankles and no palpitations.   Objective:  BP 132/80 mmHg  Pulse 71  Temp(Src) 98.1 F (36.7 C) (Oral)  Resp 18  Ht 5\' 11"  (1.803 m)  Wt 227 lb 12.8 oz (103.329 kg)  BMI 31.79 kg/m2  SpO2 98%  Appearance alert, well appearing, and in no distress, oriented to person, place, and time and overweight. General exam BP noted to be  well controlled today in office, S1, S2 normal, no gallop, no murmur, chest clear, no JVD, no HSM, no edema.  Lab review: labs are reviewed, up to date and normal.   Assessment:   [name] was seen today for depression.  Diagnoses and all orders for this visit:  Essential hypertension -     amLODipine (NORVASC) 10 MG tablet; Take 1 tablet (10 mg total) by mouth daily. -     valsartan (DIOVAN) 160 MG tablet; Take 1 tablet (160 mg total) by mouth daily. Patient blood pressure is stable and may continue on current medication.  Education on diet, exercise, and modifiable risk factors discussed. Will obtain appropriate labs as needed. Will follow up in 3-6 months.   Anxiety -   Refilled busPIRone (BUSPAR) 7.5 MG tablet; Take 1 tablet (7.5 mg total) by mouth 2 (two) times daily.  Depression I have stressed to patient the need for him to go to [place] or [place] to have a proper evaluation. I have refilled his Buspar but he refuses Zoloft and Wellbutrin to start. He is safe for discharge and is not suicidal.    Plan:  Current treatment plan is effective, no change in therapy. Reviewed diet, exercise and weight control. Recommended sodium restriction. Very strongly urged to quit smoking to reduce cardiovascular risk. Cardiovascular risk and specific lipid/LDL goals reviewed.  Return in about 3 months (around 04/20/2016) for Hypertension.  Lance Bosch, NP 01/20/2016 11:38 AM

## 2016-01-21 MED FILL — ATORVASTATIN 20 MG TABLET: 20 | 30 days supply | Qty: 30 | Fill #7

## 2016-02-17 MED FILL — ATORVASTATIN 20 MG TABLET: 20 | 30 days supply | Qty: 30 | Fill #8

## 2016-02-17 MED FILL — busPIRone HCL 7.5 MG TABS: 7.5 | 30 days supply | Qty: 60 | Fill #1

## 2016-02-17 MED FILL — ?AMLODIPINE BESYLATE 10 MG: 10 | 30 days supply | Qty: 30 | Fill #1

## 2016-02-18 MED FILL — VALSARTAN 160 MG TABLET: 160 | 30 days supply | Qty: 30 | Fill #1

## 2016-03-30 IMAGING — CR DG ABDOMEN 1V
2 series · 2 of 2 positions shown · non-contrast
Comparison: CT scan dated 01/13/2015

CLINICAL DATA: Right ureteral stone.

EXAM:
ABDOMEN - 1 VIEW

[t abdomen supine *]
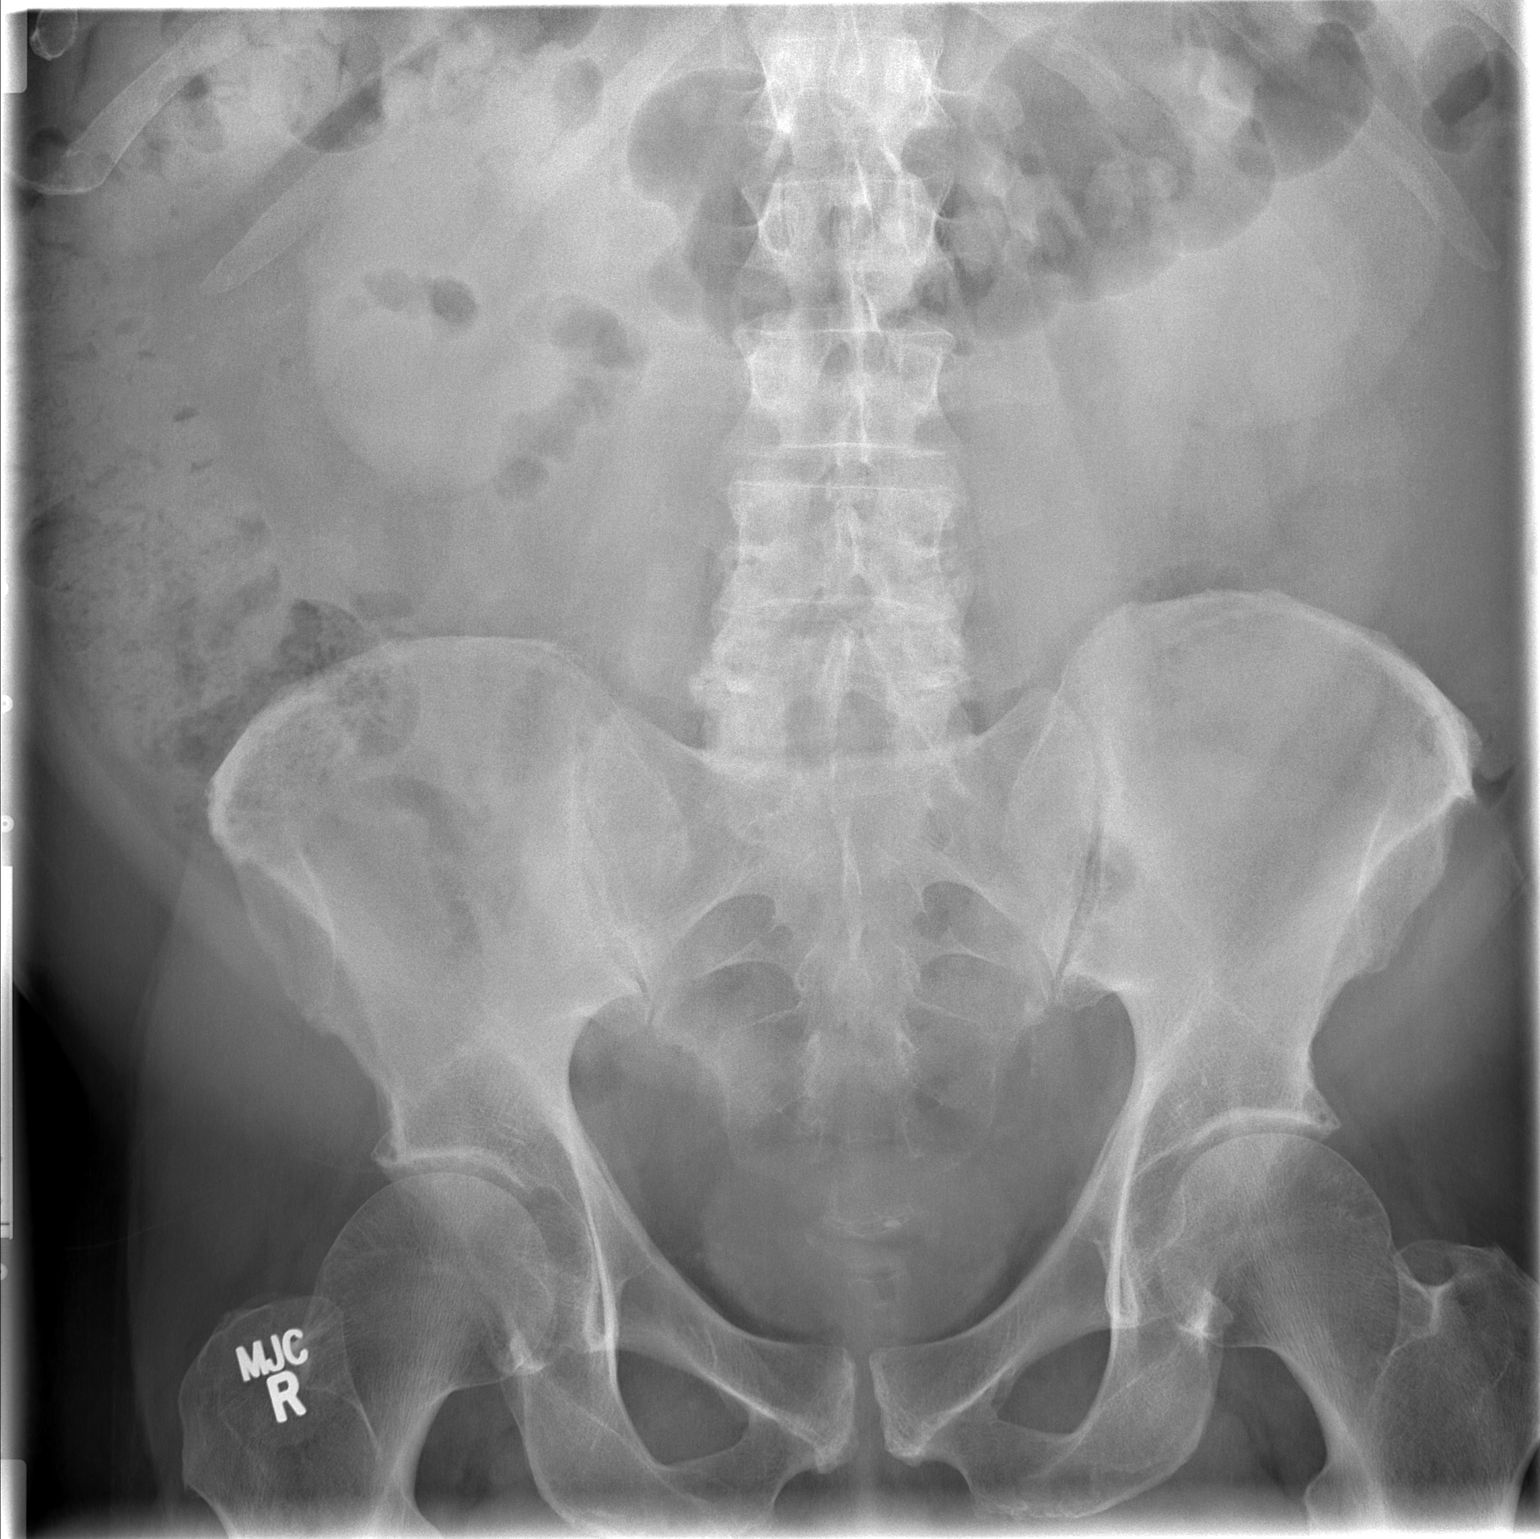

[t abdomen supine]
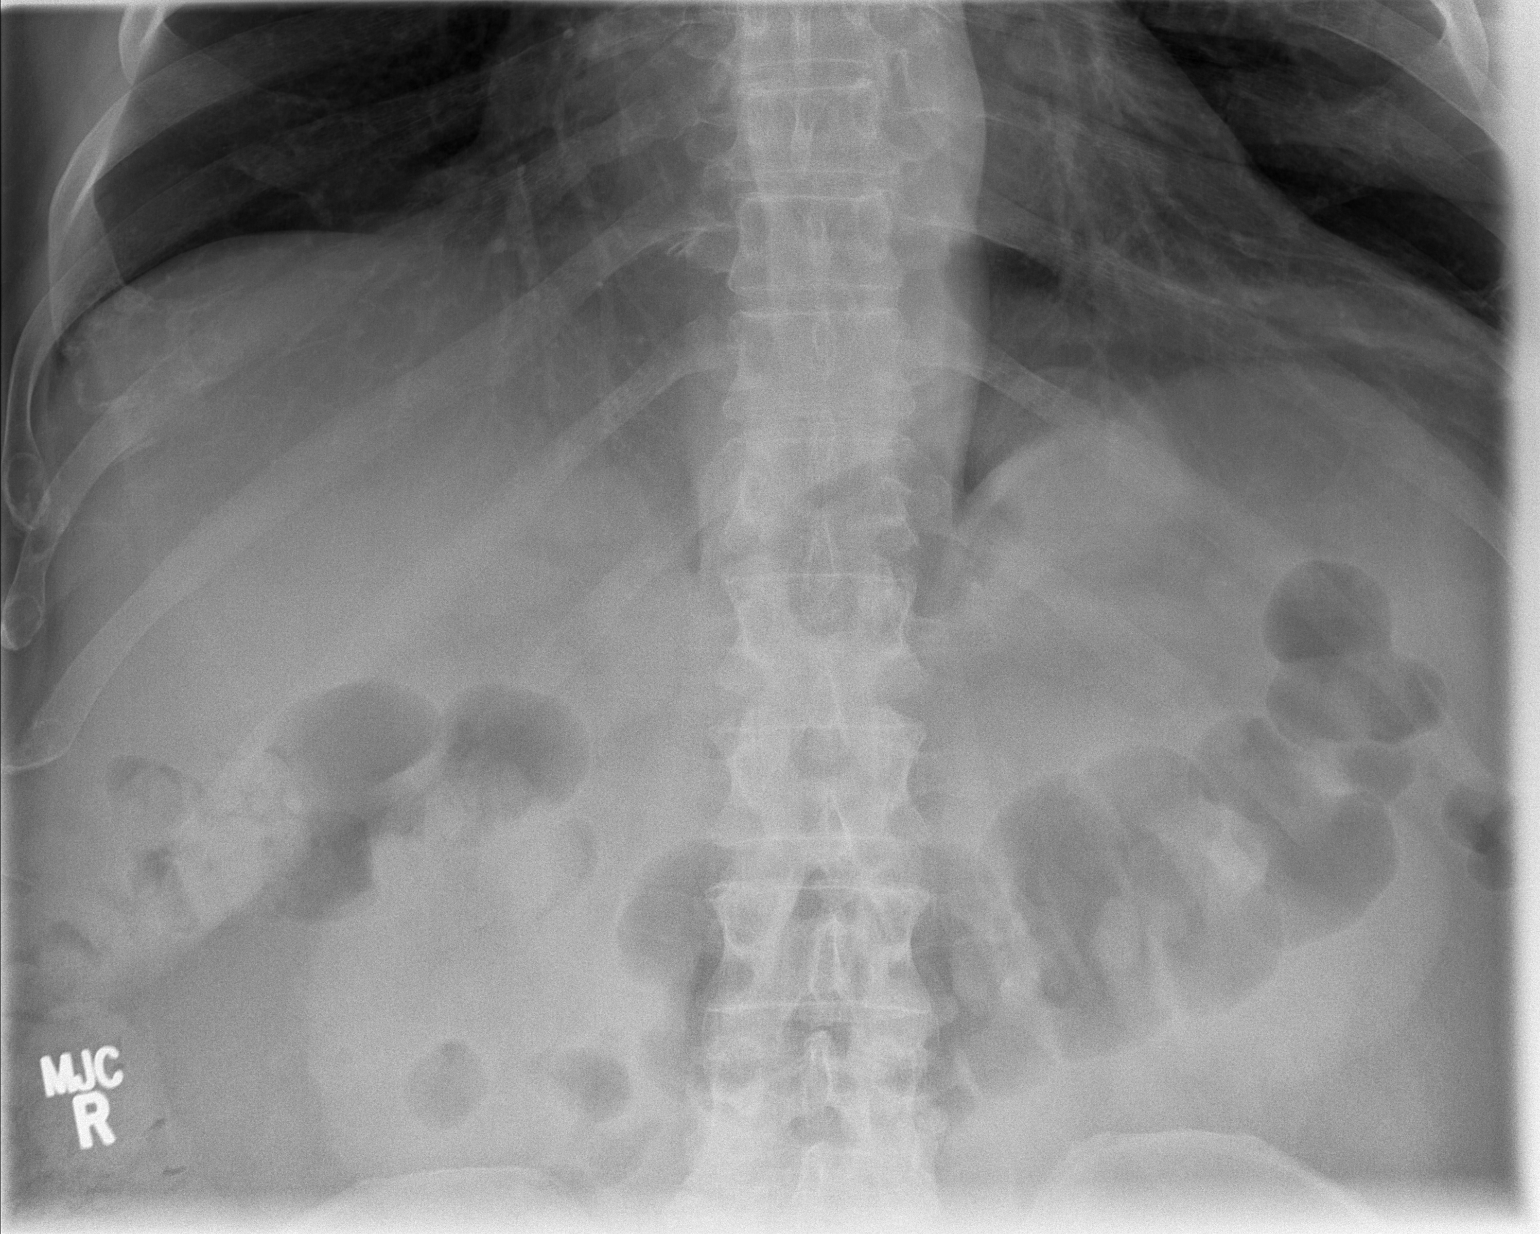

[2 of 2 positions shown; findings below may reference images not displayed]

FINDINGS: The bowel gas pattern is normal. The previously demonstrated
ureteral stone overlies the lumbar spine and is not appreciable on
this exam.

No acute osseous abnormality.  No visible free air or free fluid.
IMPRESSION: Benign appearing abdomen. Previously demonstrated ureteral stone
overlies the spine and is not visible on this exam.

## 2016-03-31 ENCOUNTER — Other Ambulatory Visit: Payer: Self-pay | Admitting: Internal Medicine

## 2016-03-31 MED FILL — VALSARTAN 160 MG TABLET: 160 | 30 days supply | Qty: 30 | Fill #2

## 2016-03-31 MED FILL — busPIRone HCL 7.5 MG TABS: 7.5 | 30 days supply | Qty: 60 | Fill #2

## 2016-03-31 MED FILL — AMLODIPINE BESYLATE 10 MG T: 10 | 30 days supply | Qty: 30 | Fill #2

## 2016-04-04 ENCOUNTER — Other Ambulatory Visit: Payer: Self-pay | Admitting: Internal Medicine

## 2016-04-11 ENCOUNTER — Other Ambulatory Visit: Payer: Self-pay | Admitting: Internal Medicine

## 2016-04-12 MED FILL — ATORVASTATIN 20 MG TABLET: 20 | 30 days supply | Qty: 30 | Fill #0

## 2016-04-14 ENCOUNTER — Ambulatory Visit: Payer: Self-pay

## 2016-04-14 ENCOUNTER — Ambulatory Visit: Payer: Self-pay | Attending: Internal Medicine | Admitting: Physician Assistant

## 2016-04-14 VITALS — BP 135/88 | HR 77 | Temp 98.3°F | Resp 18 | Ht 71.0 in | Wt 235.4 lb

## 2016-04-14 DIAGNOSIS — R739 Hyperglycemia, unspecified: Secondary | ICD-10-CM

## 2016-04-14 DIAGNOSIS — E785 Hyperlipidemia, unspecified: Secondary | ICD-10-CM

## 2016-04-14 DIAGNOSIS — F419 Anxiety disorder, unspecified: Secondary | ICD-10-CM

## 2016-04-14 DIAGNOSIS — I10 Essential (primary) hypertension: Secondary | ICD-10-CM

## 2016-04-14 LAB — HEMOGLOBIN A1C
Hgb A1c MFr Bld: 6.4 % — ABNORMAL HIGH (ref <5.7)
Mean Plasma Glucose: 137 mg/dL

## 2016-04-14 LAB — COMPREHENSIVE METABOLIC PANEL
ALK PHOS: 76 U/L (ref 40–115)
ALT: 31 U/L (ref 9–46)
AST: 20 U/L (ref 10–35)
Albumin: 4.7 g/dL (ref 3.6–5.1)
BUN: 18 mg/dL (ref 7–25)
CALCIUM: 9.8 mg/dL (ref 8.6–10.3)
CHLORIDE: 103 mmol/L (ref 98–110)
CO2: 23 mmol/L (ref 20–31)
Creat: 1.17 mg/dL (ref 0.70–1.25)
Glucose, Bld: 114 mg/dL — ABNORMAL HIGH (ref 65–99)
POTASSIUM: 4.5 mmol/L (ref 3.5–5.3)
Sodium: 139 mmol/L (ref 135–146)
TOTAL PROTEIN: 6.8 g/dL (ref 6.1–8.1)
Total Bilirubin: 0.6 mg/dL (ref 0.2–1.2)

## 2016-04-14 LAB — LIPID PANEL
CHOL/HDL RATIO: 3.8 ratio (ref <=5.0)
CHOLESTEROL: 178 mg/dL (ref 125–200)
HDL: 47 mg/dL (ref >=40)
LDL Cholesterol: 103 mg/dL (ref <130)
TRIGLYCERIDES: 140 mg/dL (ref <150)
VLDL: 28 mg/dL (ref <30)

## 2016-04-14 MED ORDER — BUSPIRONE HCL 7.5 MG PO TABS: 7.5000 mg | ORAL_TABLET | Freq: Two times a day (BID) | ORAL | Status: DC

## 2016-04-14 MED ORDER — ATORVASTATIN CALCIUM 20 MG PO TABS: 20.0000 mg | ORAL_TABLET | Freq: Every day | ORAL | Status: DC

## 2016-04-14 MED ORDER — AMLODIPINE BESYLATE 10 MG PO TABS: 10.0000 mg | ORAL_TABLET | Freq: Every day | ORAL | Status: DC

## 2016-04-14 MED ORDER — VALSARTAN 160 MG PO TABS: 160.0000 mg | ORAL_TABLET | Freq: Every day | ORAL | Status: DC

## 2016-04-14 NOTE — Patient Instructions (Signed)
Hyperglycemia °Hyperglycemia occurs when the glucose (sugar) in your blood is too high. Hyperglycemia can happen for many reasons, but it most often happens to people who do not know they have diabetes or are not managing their diabetes properly.  °CAUSES  °Whether you have diabetes or not, there are other causes of hyperglycemia. Hyperglycemia can occur when you have diabetes, but it can also occur in other situations that you might not be as aware of, such as: °Diabetes °· If you have diabetes and are having problems controlling your blood glucose, hyperglycemia could occur because of some of the following reasons: °¨ Not following your meal plan. °¨ Not taking your diabetes medications or not taking it properly. °¨ Exercising less or doing less activity than you normally do. °¨ Being sick. °Pre-diabetes °· This cannot be ignored. Before people develop Type 2 diabetes, they almost always have "pre-diabetes." This is when your blood glucose levels are higher than normal, but not yet high enough to be diagnosed as diabetes. Research has shown that some long-term damage to the body, especially the heart and circulatory system, may already be occurring during pre-diabetes. If you take action to manage your blood glucose when you have pre-diabetes, you may delay or prevent Type 2 diabetes from developing. °Stress °· If you have diabetes, you may be "diet" controlled or on oral medications or insulin to control your diabetes. However, you may find that your blood glucose is higher than usual in the hospital whether you have diabetes or not. This is often referred to as "stress hyperglycemia." Stress can elevate your blood glucose. This happens because of hormones put out by the body during times of stress. If stress has been the cause of your high blood glucose, it can be followed regularly by your caregiver. That way he/she can make sure your hyperglycemia does not continue to get worse or progress to  diabetes. °Steroids °· Steroids are medications that act on the infection fighting system (immune system) to block inflammation or infection. One side effect can be a rise in blood glucose. Most people can produce enough extra insulin to allow for this rise, but for those who cannot, steroids make blood glucose levels go even higher. It is not unusual for steroid treatments to "uncover" diabetes that is developing. It is not always possible to determine if the hyperglycemia will go away after the steroids are stopped. A special blood test called an A1c is sometimes done to determine if your blood glucose was elevated before the steroids were started. °SYMPTOMS °· Thirsty. °· Frequent urination. °· Dry mouth. °· Blurred vision. °· Tired or fatigue. °· Weakness. °· Sleepy. °· Tingling in feet or leg. °DIAGNOSIS  °Diagnosis is made by monitoring blood glucose in one or all of the following ways: °· A1c test. This is a chemical found in your blood. °· Fingerstick blood glucose monitoring. °· Laboratory results. °TREATMENT  °First, knowing the cause of the hyperglycemia is important before the hyperglycemia can be treated. Treatment may include, but is not be limited to: °· Education. °· Change or adjustment in medications. °· Change or adjustment in meal plan. °· Treatment for an illness, infection, etc. °· More frequent blood glucose monitoring. °· Change in exercise plan. °· Decreasing or stopping steroids. °· Lifestyle changes. °HOME CARE INSTRUCTIONS  °· Test your blood glucose as directed. °· Exercise regularly. Your caregiver will give you instructions about exercise. Pre-diabetes or diabetes which comes on with stress is helped by exercising. °· Eat wholesome,   balanced meals. Eat often and at regular, fixed times. Your caregiver or nutritionist will give you a meal plan to guide your sugar intake. °· Being at an ideal weight is important. If needed, losing as little as 10 to 15 pounds may help improve blood  glucose levels. °SEEK MEDICAL CARE IF:  °· You have questions about medicine, activity, or diet. °· You continue to have symptoms (problems such as increased thirst, urination, or weight gain). °SEEK IMMEDIATE MEDICAL CARE IF:  °· You are vomiting or have diarrhea. °· Your breath smells fruity. °· You are breathing faster or slower. °· You are very sleepy or incoherent. °· You have numbness, tingling, or pain in your feet or hands. °· You have chest pain. °· Your symptoms get worse even though you have been following your caregiver's orders. °· If you have any other questions or concerns. °  °This information is not intended to replace advice given to you by your health care provider. Make sure you discuss any questions you have with your health care provider. °  °Document Released: 03/28/2001 Document Revised: 12/25/2011 Document Reviewed: 06/08/2015 °Elsevier Interactive Patient Education ©2016 [employer] Inc[employer] ° °

## 2016-04-14 NOTE — Progress Notes (Signed)
Pt here for medication refill on atorvastin. Pt denies any pain. Pt has not taken any medications today. Pt has not eaten.

## 2016-04-14 NOTE — Progress Notes (Signed)
Patient ID: LAM HICKEN, male   DOB: 03-24-1956, 60 y.o.   MRN: [id number]   Luis Henderson, is a 60 y.o. male here for med rf.   [id number]  [id number]  DOB - Nov 16, 1955  Chief Complaint  Patient presents with  . Medication Refill        Subjective:  Chief Complaint and HPI: Luis Henderson is a 60 y.o. male here today for medication refills and bloodwork.  He says BP is stable out of office.  He is compliant with his medications.  Anxiety is stable on Buspar.  He denies SI/HI.  He denies HA/CP?SOB or any complaints today other than being unable to find a job.  He is tolerating his medications without any side effects. He hasn't eaten or taken his meds today. On chart review, I noticed his blood sugar has been mildly elevated in the past.  He denies S/Sx of hyper/hypoglycemia.    ROS:   Constitutional:  No f/c, No night sweats, No unexplained weight loss. EENT:  No vision changes, No blurry vision, No hearing changes. No mouth, throat, or ear problems.  Respiratory: No cough, No SOB Cardiac: No CP, no palpitations GI:  No abd pain, No N/V/D. GU: No Urinary s/sx Musculoskeletal: No joint pain Neuro: No headache, no dizziness, no motor weakness.  Skin: No rash Endocrine:  No polydipsia. No polyuria.  Psych: Denies SI/HI  No problems updated.  ALLERGIES: Allergies  Allergen Reactions  . Codeine Itching  . Lisinopril Cough    PAST MEDICAL HISTORY: Past Medical History  Diagnosis Date  . Hypertension   . Hyperlipidemia   . Depression   . Anxiety   . Diabetes mellitus     MEDICATIONS AT HOME: Prior to Admission medications   Medication Sig Start Date End Date Taking? Authorizing Provider  amLODipine (NORVASC) 10 MG tablet Take 1 tablet (10 mg total) by mouth daily. 04/14/16  Yes Argentina Donovan, PA-C  aspirin 81 MG tablet Take 81 mg by mouth daily.   Yes Historical Provider, MD  atorvastatin (LIPITOR) 20 MG tablet Take 1 tablet (20 mg total) by mouth  daily. 04/14/16  Yes Dionne Bucy McClung, PA-C  busPIRone (BUSPAR) 7.5 MG tablet Take 1 tablet (7.5 mg total) by mouth 2 (two) times daily. 04/14/16  Yes Dionne Bucy McClung, PA-C  valsartan (DIOVAN) 160 MG tablet Take 1 tablet (160 mg total) by mouth daily. 04/14/16  Yes Dionne Bucy McClung, PA-C  fluticasone (CUTIVATE) 0.05 % cream Apply topically 2 (two) times daily. Patient not taking: Reported on 04/14/2016 12/30/15   Billy Fischer, MD     Objective:  EXAM:   Filed Vitals:   04/14/16 0938  BP: 135/88  Pulse: 77  Temp: 98.3 F (36.8 C)  TempSrc: Oral  Resp: 18  Height: 5\' 11"  (1.803 m)  Weight: 235 lb 6.4 oz (106.777 kg)  SpO2: 97%    General appearance : A&OX3. NAD. Non-toxic-appearing HEENT: Atraumatic and Normocephalic.  PERRLA. EOM intact.  TM clear B. Mouth-MMM, post pharynx WNL w/o erythema, No PND. Neck: supple, no JVD. No cervical lymphadenopathy. No thyromegaly Chest/Lungs:  Breathing-non-labored, Good air entry bilaterally, breath sounds normal without rales, rhonchi, or wheezing  CVS: S1 S2 regular, no murmurs, gallops, rubs  Extremities: Bilateral Lower Ext shows no edema, both legs are warm to touch with = pulse throughout Neurology:  CN II-XII grossly intact, Non focal.   Psych:  TP linear. J/I WNL. Normal speech. Appropriate eye contact and affect.  Skin:  No Rash  Data Review Lab Results  Component Value Date   HGBA1C 6.20 08/02/2015   HGBA1C 6.40 01/21/2015     Assessment & Plan   1. Essential hypertension Likely controlled-he hasn't taken his meds today and says it is ~120/80 out of the office. We have discussed target BP range and blood pressure goal. I have advised patient to check BP regularly and to call us back or report to clinic if the numbers are consistently higher than 140/90. We discussed the importance of compliance with medical therapy and DASH diet recommended, consequences of uncontrolled hypertension discussed.  - Comprehensive metabolic panel -  Lipid panel - valsartan (DIOVAN) 160 MG tablet; Take 1 tablet (160 mg total) by mouth daily.  Dispense: 30 tablet; Refill: 5 - amLODipine (NORVASC) 10 MG tablet; Take 1 tablet (10 mg total) by mouth daily.  Dispense: 30 tablet; Refill: 5  2. Hyperglycemia Counseled on diet and exercise - Hemoglobin A1c  3. Hyperlipidemia - Comprehensive metabolic panel - Lipid panel - atorvastatin (LIPITOR) 20 MG tablet; Take 1 tablet (20 mg total) by mouth daily.  Dispense: 90 tablet; Refill: 2  4. Anxiety - busPIRone (BUSPAR) 7.5 MG tablet; Take 1 tablet (7.5 mg total) by mouth 2 (two) times daily.  Dispense: 60 tablet; Refill: 5  Patient have been counseled extensively about nutrition and exercise  Return in about 6 months (around 10/14/2016) for with Dr Doreene Burke. sooner if needed or predicated by labs from today.   The patient was given clear instructions to go to ER or return to medical center if symptoms don't improve, worsen or new problems develop. The patient verbalized understanding. The patient was told to call to get lab results if they haven't heard anything in the next week.     Freeman Caldron, PA-C [facility] [place], [place]   04/14/2016, 10:17 AM

## 2016-05-17 ENCOUNTER — Ambulatory Visit (HOSPITAL_COMMUNITY): Admission: EM | Admit: 2016-05-17 | Discharge: 2016-05-17 | Discharge status: Absent without leave | Payer: Self-pay

## 2016-05-17 ENCOUNTER — Telehealth: Payer: Self-pay

## 2016-05-17 MED FILL — ATORVASTATIN 20 MG TABLET: 20 | 30 days supply | Qty: 30 | Fill #0

## 2016-05-17 MED FILL — ?AMLODIPINE BESYLATE 10 MG: 10 | 30 days supply | Qty: 30 | Fill #3

## 2016-05-17 MED FILL — VALSARTAN 160 MG TABLET: 160 | 30 days supply | Qty: 30 | Fill #3

## 2016-05-17 NOTE — Telephone Encounter (Signed)
-----   Message from Argentina Donovan, [place] sent at 04/20/2016  8:50 AM EDT ----- Please call patient and let him know:  1)cholesterol looks great.  Continue meds. 2)Blood sugar was high and the HgbA1C was high and indicates early diabetes.  This is likely reversible if he exercises and cuts out sugars.  Ask him to strictly decrease/eliminate desserts, drinks with sugar, and white carbs.  Increase water intake.  If he does this and gets closer to his ideal body weight, this will likely go back to normal.  If not, he will need to start medications.  Have him work on these changes over the next 3 months and we will recheck it at his f/up appt.  Thanks, Freeman Caldron, PA-C

## 2016-05-17 NOTE — Telephone Encounter (Signed)
Cll pt - advsd of lab results; recommendations for reversing elevated HgbA1C; recheck in 3 mths. Pt stated he understood and had no other questions at this time.

## 2016-05-18 ENCOUNTER — Ambulatory Visit: Payer: Self-pay | Attending: Family Medicine | Admitting: Family Medicine

## 2016-05-18 ENCOUNTER — Encounter: Payer: Self-pay | Admitting: Family Medicine

## 2016-05-18 VITALS — BP 116/70 | HR 70 | Temp 98.8°F | Ht 71.0 in | Wt 237.4 lb

## 2016-05-18 DIAGNOSIS — R7303 Prediabetes: Secondary | ICD-10-CM | POA: Insufficient documentation

## 2016-05-18 DIAGNOSIS — Z79899 Other long term (current) drug therapy: Secondary | ICD-10-CM | POA: Insufficient documentation

## 2016-05-18 DIAGNOSIS — F419 Anxiety disorder, unspecified: Secondary | ICD-10-CM | POA: Insufficient documentation

## 2016-05-18 DIAGNOSIS — M545 Low back pain: Secondary | ICD-10-CM | POA: Insufficient documentation

## 2016-05-18 MED ORDER — CYCLOBENZAPRINE HCL 10 MG PO TABS: 10.0000 mg | ORAL_TABLET | Freq: Every day | ORAL | 1 refills | Status: DC

## 2016-05-18 MED ORDER — KETOROLAC TROMETHAMINE 60 MG/2ML IM SOLN
60.0000 mg | Freq: Once | INTRAMUSCULAR | Status: AC
  Administered 2016-05-18: 60 mg via INTRAMUSCULAR

## 2016-05-18 MED ORDER — MELOXICAM 7.5 MG PO TABS: 7.5000 mg | ORAL_TABLET | Freq: Every day | ORAL | 1 refills | Status: DC

## 2016-05-18 MED FILL — ?CYCLOBENZAPRINE 10 MG TABL: 10 | 30 days supply | Qty: 30 | Fill #0

## 2016-05-18 MED FILL — MELOXICAM 7.5 MG TABLET: 7.5 | 30 days supply | Qty: 30 | Fill #0

## 2016-05-18 NOTE — Progress Notes (Signed)
Subjective:  Patient ID: Luis Henderson, male    DOB: 11-Apr-1956  Age: 60 y.o. MRN: [id number]  CC: Hypertension and Back Pain (lower)   HPI Luis Henderson is a 60 year old male with a history of prediabetes (A1c 6.4), anxiety who presents today complaining of back pain which is chronic but has worsened over the last 6 weeks up to the point where he shuffles while walking due to pain which is rated at a 6-10/10. Pain is located in his tailbone and is worse with change of position from sitting to standing or vice versa and he denies numbness or weakness of lower extremities or loss of bowel/bladder function but has had some accidents due to not being able to make it to the bathroom in time. MRI from 10/2014 revealed spondylolisthesis of L4 over L5 and severe arthritis of L5-S1.  Outpatient Medications Prior to Visit  Medication Sig Dispense Refill  . amLODipine (NORVASC) 10 MG tablet Take 1 tablet (10 mg total) by mouth daily. 30 tablet 5  . aspirin 81 MG tablet Take 81 mg by mouth daily.    Marland Kitchen atorvastatin (LIPITOR) 20 MG tablet Take 1 tablet (20 mg total) by mouth daily. 90 tablet 2  . busPIRone (BUSPAR) 7.5 MG tablet Take 1 tablet (7.5 mg total) by mouth 2 (two) times daily. 60 tablet 5  . valsartan (DIOVAN) 160 MG tablet Take 1 tablet (160 mg total) by mouth daily. 30 tablet 5  . fluticasone (CUTIVATE) 0.05 % cream Apply topically 2 (two) times daily. (Patient not taking: Reported on 04/14/2016) 30 g 0   No facility-administered medications prior to visit.     ROS Review of Systems  Constitutional: Negative for activity change and appetite change.  HENT: Negative for sinus pressure and sore throat.   Respiratory: Negative for chest tightness, shortness of breath and wheezing.   Cardiovascular: Negative for chest pain and palpitations.  Gastrointestinal: Negative for abdominal distention, abdominal pain and constipation.  Genitourinary: Negative.   Musculoskeletal:       See  hpi  Psychiatric/Behavioral: Negative for behavioral problems and dysphoric mood.    Objective:  BP 116/70 (BP Location: Right Arm, Patient Position: Sitting, Cuff Size: Large)   Pulse 70   Temp 98.8 F (37.1 C) (Oral)   Ht 5\' 11"  (1.803 m)   Wt 237 lb 6.4 oz (107.7 kg)   SpO2 97%   BMI 33.11 kg/m   BP/Weight 05/18/2016 [id number] 0000000  Systolic BP 99991111 [id number]  Diastolic BP 70 88 80  Wt. (Lbs) 237.4 235.4 227.8  BMI 33.11 32.85 31.79      Physical Exam  Constitutional: He is oriented to person, place, and time. He appears well-developed and well-nourished.  Cardiovascular: Normal rate, normal heart sounds and intact distal pulses.   No murmur heard. Pulmonary/Chest: Effort normal and breath sounds normal. He has no wheezes. He has no rales. He exhibits no tenderness.  Abdominal: Soft. Bowel sounds are normal. He exhibits no distension and no mass. There is no tenderness.  Musculoskeletal: Normal range of motion.  Neurological: He is alert and oriented to person, place, and time.   CLINICAL DATA:  Low back pain extending into both legs.  EXAM: MRI LUMBAR SPINE WITHOUT CONTRAST  TECHNIQUE: Multiplanar, multisequence MR imaging of the lumbar spine was performed. No intravenous contrast was administered.  COMPARISON:  Radiographs dated 10/15/2014  FINDINGS: Normal conus tip at T12. Incompletely visualized 6 cm cyst in the mid and inferior aspects of the  left kidney. Paraspinal soft tissues are otherwise normal.  T12-L1 and L1-2:  Normal.  L2-3:  Normal disc.  Slight right facet arthritis.  L3-4:  Slight disc space narrowing.  No disc bulging or protrusion.  L4-5: 4 mm spondylolisthesis due to severe bilateral facet arthritis. No bulging or protrusion of the uncovered disc. Slight narrowing of the neural foramina bilaterally without focal neural impingement.  L5-S1: Normal disc. Moderately severe right facet arthritis. No foraminal  stenosis.  IMPRESSION: 1. Grade 1 spondylolisthesis of L4 on L5 with severe bilateral facet arthritis. No focal neural impingement at L4-5. 2. Moderately severe right facet arthritis at L5-S1.   Electronically Signed   By: Rozetta Nunnery M.D.   On: 10/21/2014 08:13  Assessment & Plan:   1. Low back pain without sciatica, unspecified back pain laterality No red flags at this time Will use NSAIDs and muscle relaxant and reassess at next visit ED precautions reviewed. - ketorolac (TORADOL) injection 60 mg; Inject 2 mLs (60 mg total) into the muscle once. - meloxicam (MOBIC) 7.5 MG tablet; Take 1 tablet (7.5 mg total) by mouth daily.  Dispense: 30 tablet; Refill: 1 - cyclobenzaprine (FLEXERIL) 10 MG tablet; Take 1 tablet (10 mg total) by mouth at bedtime.  Dispense: 30 tablet; Refill: 1  2. Anxiety Currently on BuSpar which she takes intermittently due to dizziness. We have discussed other options of treatment for anxiety including SSRI use which he refuses at this time as he states he tried them in the past and does not like them. I have given him the option of taking BuSpar at bedtime and we will reassess at next visit.   Meds ordered this encounter  Medications  . ketorolac (TORADOL) injection 60 mg  . meloxicam (MOBIC) 7.5 MG tablet    Sig: Take 1 tablet (7.5 mg total) by mouth daily.    Dispense:  30 tablet    Refill:  1  . cyclobenzaprine (FLEXERIL) 10 MG tablet    Sig: Take 1 tablet (10 mg total) by mouth at bedtime.    Dispense:  30 tablet    Refill:  1    Follow-up: Return in about 3 weeks (around 06/08/2016), or if symptoms worsen or fail to improve, for Follow-up of back pain.   Arnoldo Morale MD

## 2016-05-18 NOTE — Progress Notes (Signed)
Taking buspar intermittently due to dizziness- would like to try something else

## 2016-05-18 NOTE — Patient Instructions (Signed)

## 2016-06-09 ENCOUNTER — Ambulatory Visit: Payer: Self-pay | Attending: Family Medicine | Admitting: Family Medicine

## 2016-06-09 ENCOUNTER — Encounter: Payer: Self-pay | Admitting: Family Medicine

## 2016-06-09 VITALS — BP 153/90 | HR 87 | Temp 98.4°F

## 2016-06-09 DIAGNOSIS — F419 Anxiety disorder, unspecified: Secondary | ICD-10-CM

## 2016-06-09 DIAGNOSIS — Z23 Encounter for immunization: Secondary | ICD-10-CM

## 2016-06-09 DIAGNOSIS — Z79899 Other long term (current) drug therapy: Secondary | ICD-10-CM | POA: Insufficient documentation

## 2016-06-09 DIAGNOSIS — I10 Essential (primary) hypertension: Secondary | ICD-10-CM

## 2016-06-09 DIAGNOSIS — M545 Low back pain, unspecified: Secondary | ICD-10-CM | POA: Insufficient documentation

## 2016-06-09 DIAGNOSIS — Z9889 Other specified postprocedural states: Secondary | ICD-10-CM | POA: Insufficient documentation

## 2016-06-09 DIAGNOSIS — Z888 Allergy status to other drugs, medicaments and biological substances status: Secondary | ICD-10-CM | POA: Insufficient documentation

## 2016-06-09 MED ORDER — TRAMADOL HCL 50 MG PO TABS: 50.0000 mg | ORAL_TABLET | Freq: Three times a day (TID) | ORAL | 1 refills | Status: DC | PRN

## 2016-06-09 MED FILL — traMADol HCL 50 MG TABS: 50 | 20 days supply | Qty: 60 | Fill #0

## 2016-06-09 NOTE — Progress Notes (Signed)
Neither meds are helping with back pain

## 2016-06-09 NOTE — Progress Notes (Signed)
Subjective:    Patient ID: Luis Henderson, male    DOB: 08-18-1956, 60 y.o.   MRN: [id number]  HPI 60 year old male with a history of hypertension, anxiety who presents for follow-up of low back pain which does not radiate to lower extremities.  Pain is located in his tailbone and is worse with change of position from sitting to standing or vice versa and he denies numbness or weakness of lower extremities or loss of bowel/bladder function but has had some accidents due to not being able to make it to the bathroom in time. MRI from 10/2014 revealed spondylolisthesis of L4 over L5 and severe arthritis of L5-S1.  Pain is described as 6-10/10 and is unchanged compared to his last office visit despite the fact that he has been taking meloxicam and Flexeril. He takes BuSpar for anxiety and informs me he now takes this daily rather than as needed which she had set at his last visit.  Blood pressure is elevated and he endorses not taking his antihypertensives due to being in a hurry this morning.  Past Medical History:  Diagnosis Date  . Anxiety   . Depression   . Diabetes mellitus   . Hyperlipidemia   . Hypertension     Past Surgical History:  Procedure Laterality Date  . CYSTOSCOPY WITH RETROGRADE PYELOGRAM, URETEROSCOPY AND STENT PLACEMENT Right 01/15/2015   Procedure: CYSTOSCOPY, RIGHT URETEROSCOPY/ RETROGRADE PYELOGRAM,HOLMIUM LASER/ LITHOTRIPSY, STENT PLACEMENT, URETERAL BALLOON DIALATION;  Surgeon: Kathie Rhodes, MD;  Location: [facility];  Service: Urology;  Laterality: Right;  . SKIN GRAFT     left hand  . WISDOM TOOTH EXTRACTION      Allergies  Allergen Reactions  . Codeine Itching  . Lisinopril Cough    Current Outpatient Prescriptions on File Prior to Visit  Medication Sig Dispense Refill  . amLODipine (NORVASC) 10 MG tablet Take 1 tablet (10 mg total) by mouth daily. 30 tablet 5  . aspirin 81 MG tablet Take 81 mg by mouth daily.    Marland Kitchen atorvastatin (LIPITOR) 20 MG tablet  Take 1 tablet (20 mg total) by mouth daily. 90 tablet 2  . busPIRone (BUSPAR) 7.5 MG tablet Take 1 tablet (7.5 mg total) by mouth 2 (two) times daily. 60 tablet 5  . cyclobenzaprine (FLEXERIL) 10 MG tablet Take 1 tablet (10 mg total) by mouth at bedtime. 30 tablet 1  . meloxicam (MOBIC) 7.5 MG tablet Take 1 tablet (7.5 mg total) by mouth daily. 30 tablet 1  . valsartan (DIOVAN) 160 MG tablet Take 1 tablet (160 mg total) by mouth daily. 30 tablet 5   No current facility-administered medications on file prior to visit.      Review of Systems Constitutional: Negative for activity change and appetite change.  HENT: Negative for sinus pressure and sore throat.   Respiratory: Negative for chest tightness, shortness of breath and wheezing.   Cardiovascular: Negative for chest pain and palpitations.  Gastrointestinal: Negative for abdominal distention, abdominal pain and constipation.  Genitourinary: Negative.   Musculoskeletal:       See hpi  Neurologic: No sensory loss, no numbness. Skin: Normal Psychiatric/Behavioral: Negative for behavioral problems and dysphoric mood.     Objective: Vitals:   06/09/16 0900 06/09/16 0907  BP:  (!) 153/90  Pulse:  87  Temp:  98.4 F (36.9 C)  TempSrc:  Oral  Weight: (P) 217 lb 12.8 oz (98.8 kg)   Height: (P) 5\' 11"  (1.803 m)       Physical  Exam Constitutional: He is oriented to person, place, and time. He appears well-developed and well-nourished.  Cardiovascular: Normal rate, normal heart sounds and intact distal pulses.   No murmur heard. Pulmonary/Chest: Effort normal and breath sounds normal. He has no wheezes. He has no rales. He exhibits no tenderness.  Abdominal: Soft. Bowel sounds are normal. He exhibits no distension and no mass. There is no tenderness.  Musculoskeletal: No tenderness ellicited on palpation and negative straight leg raise bilaterally. Patient observed to maintain a posture suggestive of pain on getting up from a sitting  position and on walking.  Neurological: He is alert and oriented to person, place, and time. Normal muscle strength in lower extremities, normal reflexes, normal sensation.         Assessment & Plan:  1. Low back pain without sciatica, unspecified back pain laterality No red flags at this time Will use NSAIDs and muscle relaxant and Add tramadol to regimen (he does have itching with codeine and so has been advised to use Benadryl with tramadol). Advised to use heat and a back brace and we might consider referring for epidural spinal injections if symptoms persist. ED precautions reviewed. - ketorolac (TORADOL) injection 60 mg; Inject 2 mLs (60 mg total) into the muscle once. - meloxicam (MOBIC) 7.5 MG tablet; Take 1 tablet (7.5 mg total) by mouth daily.  Dispense: 30 tablet; Refill: 1 - cyclobenzaprine (FLEXERIL) 10 MG tablet; Take 1 tablet (10 mg total) by mouth at bedtime.  Dispense: 30 tablet; Refill: 1  2. Anxiety Currently on BuSpar which He is now taking daily   3.Hypertension Uncontrolled due to missing morning dose of antihypertensive This could also be secondary to use of NSAIDs Low-sodium diet Reassess blood pressure at next visit

## 2016-07-10 MED FILL — AMLODIPINE BESYLATE 10 MG T: 10 | 30 days supply | Qty: 30 | Fill #4

## 2016-07-10 MED FILL — CYCLOBENZAPRINE 10 MG TAB: 10 | 30 days supply | Qty: 30 | Fill #1

## 2016-07-10 MED FILL — ATORVASTATIN 20 MG TABLET: 20 | 30 days supply | Qty: 30 | Fill #1

## 2016-07-10 MED FILL — traMADol HCL 50 MG TABS: 50 | 20 days supply | Qty: 60 | Fill #1

## 2016-07-10 MED FILL — MELOXICAM 7.5 MG TABLET: 7.5 | 30 days supply | Qty: 30 | Fill #1

## 2016-07-10 MED FILL — VALSARTAN 160 MG TABLET: 160 | 30 days supply | Qty: 30 | Fill #4

## 2016-08-21 ENCOUNTER — Ambulatory Visit: Payer: Self-pay | Attending: Family Medicine | Admitting: Family Medicine

## 2016-08-21 ENCOUNTER — Encounter: Payer: Self-pay | Admitting: Licensed Clinical Social Worker

## 2016-08-21 ENCOUNTER — Encounter: Payer: Self-pay | Admitting: Family Medicine

## 2016-08-21 VITALS — BP 172/109 | HR 104 | Temp 98.4°F | Ht 71.0 in | Wt 243.6 lb

## 2016-08-21 DIAGNOSIS — R7303 Prediabetes: Secondary | ICD-10-CM

## 2016-08-21 DIAGNOSIS — E785 Hyperlipidemia, unspecified: Secondary | ICD-10-CM | POA: Insufficient documentation

## 2016-08-21 DIAGNOSIS — E119 Type 2 diabetes mellitus without complications: Secondary | ICD-10-CM | POA: Insufficient documentation

## 2016-08-21 DIAGNOSIS — I10 Essential (primary) hypertension: Secondary | ICD-10-CM | POA: Insufficient documentation

## 2016-08-21 DIAGNOSIS — F419 Anxiety disorder, unspecified: Secondary | ICD-10-CM | POA: Insufficient documentation

## 2016-08-21 DIAGNOSIS — M5441 Lumbago with sciatica, right side: Secondary | ICD-10-CM | POA: Insufficient documentation

## 2016-08-21 DIAGNOSIS — M5442 Lumbago with sciatica, left side: Secondary | ICD-10-CM | POA: Insufficient documentation

## 2016-08-21 DIAGNOSIS — F329 Major depressive disorder, single episode, unspecified: Secondary | ICD-10-CM | POA: Insufficient documentation

## 2016-08-21 DIAGNOSIS — G8929 Other chronic pain: Secondary | ICD-10-CM | POA: Insufficient documentation

## 2016-08-21 LAB — POCT GLYCOSYLATED HEMOGLOBIN (HGB A1C): HEMOGLOBIN A1C: 6

## 2016-08-21 MED ORDER — VALSARTAN 160 MG PO TABS: 160.0000 mg | ORAL_TABLET | Freq: Every day | ORAL | 5 refills | Status: DC

## 2016-08-21 MED ORDER — AMLODIPINE BESYLATE 10 MG PO TABS: 10.0000 mg | ORAL_TABLET | Freq: Every day | ORAL | 5 refills | Status: DC

## 2016-08-21 MED ORDER — TRAMADOL HCL 50 MG PO TABS: 50.0000 mg | ORAL_TABLET | Freq: Three times a day (TID) | ORAL | 1 refills | Status: DC | PRN

## 2016-08-21 MED ORDER — GABAPENTIN 300 MG PO CAPS: 300.0000 mg | ORAL_CAPSULE | Freq: Two times a day (BID) | ORAL | 5 refills | Status: DC

## 2016-08-21 MED ORDER — MELOXICAM 7.5 MG PO TABS: 7.5000 mg | ORAL_TABLET | Freq: Every day | ORAL | 3 refills | Status: DC

## 2016-08-21 MED ORDER — BUSPIRONE HCL 7.5 MG PO TABS: 7.5000 mg | ORAL_TABLET | Freq: Two times a day (BID) | ORAL | 5 refills | Status: DC

## 2016-08-21 MED FILL — traMADol HCL 50 MG TABS: 50 | 20 days supply | Qty: 60 | Fill #0

## 2016-08-21 MED FILL — AMLODIPINE BESYLATE 10 MG T: 10 | 30 days supply | Qty: 30 | Fill #0

## 2016-08-21 MED FILL — ?MELOXICAM 7.5 MG TABLET: 7.5 | 30 days supply | Qty: 30 | Fill #0

## 2016-08-21 MED FILL — busPIRone HCL 7.5 MG TABS: 7.5 | 30 days supply | Qty: 60 | Fill #0

## 2016-08-21 MED FILL — GABAPENTIN 300 MG CAPSULE: 300 | 30 days supply | Qty: 60 | Fill #0

## 2016-08-21 MED FILL — VALSARTAN 160 MG TABLET: 160 | 30 days supply | Qty: 30 | Fill #0

## 2016-08-21 NOTE — Progress Notes (Signed)
Subjective:  Patient ID: Luis Henderson, male    DOB: [birth year]  Age: 60 y.o. MRN: [id number]  CC: Hypertension; Back Pain; and Depression   HPI Luis Henderson year-old male with a history of hypertension, anxiety who presents for follow-up of low back pain which radiates to lower extremities intermittently.  Pain is located in his tailbone and is worse with change of position from sitting to standing or vice versa and he endorses numbness and weakness of lower extremities(which he had denied in the past) and loss of bowel/bladder function on some occassions and some accidents due to not being able to make it to the bathroom on time. MRI from 10/2014 revealed spondylolisthesis of L4 over L5 and severe arthritis of L5-S1. Pain is described as 10/10 and has worsened compared to his last office visit despite the fact that he has been taking meloxicam, Tramadol and Flexeril. Last MRI form 10/2014 revealed grade 1 spondylolisthesis of L4 on L5 with severe bilateral facet arthritis, no focal neural impingement.  He takes BuSpar for anxiety  Blood pressure is elevated and he endorses not taking his antihypertensives this morning.   Past Medical History:  Diagnosis Date  . Anxiety   . Depression   . Diabetes mellitus   . Hyperlipidemia   . Hypertension     Past Surgical History:  Procedure Laterality Date  . CYSTOSCOPY WITH RETROGRADE PYELOGRAM, URETEROSCOPY AND STENT PLACEMENT Right 01/15/2015   Procedure: CYSTOSCOPY, RIGHT URETEROSCOPY/ RETROGRADE PYELOGRAM,HOLMIUM LASER/ LITHOTRIPSY, STENT PLACEMENT, URETERAL BALLOON DIALATION;  Surgeon: Kathie Rhodes, MD;  Location: [facility];  Service: Urology;  Laterality: Right;  . SKIN GRAFT     left hand  . WISDOM TOOTH EXTRACTION      Allergies  Allergen Reactions  . Codeine Itching  . Lisinopril Cough    Current Outpatient Prescriptions on File Prior to Visit  Medication Sig Dispense Refill  . aspirin 81 MG tablet Take 81 mg by mouth  daily.    Marland Kitchen atorvastatin (LIPITOR) 20 MG tablet Take 1 tablet (20 mg total) by mouth daily. 90 tablet 2  . cyclobenzaprine (FLEXERIL) 10 MG tablet Take 1 tablet (10 mg total) by mouth at bedtime. 30 tablet 1   No current facility-administered medications on file prior to visit.      Outpatient Medications Prior to Visit  Medication Sig Dispense Refill  . aspirin 81 MG tablet Take 81 mg by mouth daily.    Marland Kitchen atorvastatin (LIPITOR) 20 MG tablet Take 1 tablet (20 mg total) by mouth daily. 90 tablet 2  . cyclobenzaprine (FLEXERIL) 10 MG tablet Take 1 tablet (10 mg total) by mouth at bedtime. 30 tablet 1  . amLODipine (NORVASC) 10 MG tablet Take 1 tablet (10 mg total) by mouth daily. 30 tablet 5  . busPIRone (BUSPAR) 7.5 MG tablet Take 1 tablet (7.5 mg total) by mouth 2 (two) times daily. 60 tablet 5  . meloxicam (MOBIC) 7.5 MG tablet Take 1 tablet (7.5 mg total) by mouth daily. 30 tablet 1  . traMADol (ULTRAM) 50 MG tablet Take 1 tablet (50 mg total) by mouth every 8 (eight) hours as needed. 60 tablet 1  . valsartan (DIOVAN) 160 MG tablet Take 1 tablet (160 mg total) by mouth daily. 30 tablet 5   No facility-administered medications prior to visit.     ROS Review of Systems Constitutional: Negative for activity change and appetite change.  HENT: Negative for sinus pressure and sore throat.   Respiratory: Negative for chest  tightness, shortness of breath and wheezing.   Cardiovascular: Negative for chest pain and palpitations.  Gastrointestinal: Negative for abdominal distention, abdominal pain and constipation.  Genitourinary: Negative.   Musculoskeletal:       See hpi  Neurologic: No sensory loss, no numbness. Skin: Normal Psychiatric/Behavioral: Negative for behavioral problems and dysphoric mood.   Objective:  BP (!) 172/109 (BP Location: Right Arm, Patient Position: Sitting, Cuff Size: Large)   Pulse (!) 104   Temp 98.4 F (36.9 C) (Oral)   Ht 5\' 11"  (1.803 m)   Wt 243 lb 9.6  oz (110.5 kg)   SpO2 99%   BMI 33.98 kg/m   BP/Weight 08/21/2016 99991111 [id number]  Systolic BP [id number] 0000000 99991111  Diastolic BP 0000000 90 70  Wt. (Lbs) 243.6 - 237.4  BMI 33.98 - 33.11      Physical Exam Constitutional: He is oriented to person, place, and time. He appears well-developed and well-nourished.  Cardiovascular: Normal rate, normal heart sounds and intact distal pulses.   No murmur heard. Pulmonary/Chest: Effort normal and breath sounds normal. He has no wheezes. He has no rales. He exhibits no tenderness.  Abdominal: Soft. Bowel sounds are normal. He exhibits no distension and no mass. There is no tenderness.  Musculoskeletal: No tenderness ellicited on palpation and negative straight leg raise bilaterally. Patient observed to maintain a posture suggestive of pain on getting up from a sitting position and on walking.  Neurological: He is alert and oriented to person, place, and time. Normal muscle strength in lower extremities, normal reflexes, normal sensation.    CLINICAL DATA:  Low back pain extending into both legs.  EXAM: MRI LUMBAR SPINE WITHOUT CONTRAST  TECHNIQUE: Multiplanar, multisequence MR imaging of the lumbar spine was performed. No intravenous contrast was administered.  COMPARISON:  Radiographs dated 10/15/2014  FINDINGS: Normal conus tip at T12. Incompletely visualized 6 cm cyst in the mid and inferior aspects of the left kidney. Paraspinal soft tissues are otherwise normal.  T12-L1 and L1-2:  Normal.  L2-3:  Normal disc.  Slight right facet arthritis.  L3-4:  Slight disc space narrowing.  No disc bulging or protrusion.  L4-5: 4 mm spondylolisthesis due to severe bilateral facet arthritis. No bulging or protrusion of the uncovered disc. Slight narrowing of the neural foramina bilaterally without focal neural impingement.  L5-S1: Normal disc. Moderately severe right facet arthritis. No foraminal stenosis.  IMPRESSION: 1. Grade 1  spondylolisthesis of L4 on L5 with severe bilateral facet arthritis. No focal neural impingement at L4-5. 2. Moderately severe right facet arthritis at L5-S1.   Electronically Signed   By: Rozetta Nunnery M.D.   On: 10/21/2014 08:13  Assessment & Plan:   1. Essential hypertension Uncontrolled due to not taking  Antihypertensive and also due to acute pain. Compliance emphasized and low sodium diet - valsartan (DIOVAN) 160 MG tablet; Take 1 tablet (160 mg total) by mouth daily.  Dispense: 30 tablet; Refill: 5 - amLODipine (NORVASC) 10 MG tablet; Take 1 tablet (10 mg total) by mouth daily.  Dispense: 30 tablet; Refill: 5  2. Anxiety Uncontrolled Underlying acute on chronic pain is partly contributory LCSW called in for therapy - busPIRone (BUSPAR) 7.5 MG tablet; Take 1 tablet (7.5 mg total) by mouth 2 (two) times daily.  Dispense: 60 tablet; Refill: 5  3. Chronic midline low back pain with bilateral sciatica Worsening Gabapentin added to regimen - traMADol (ULTRAM) 50 MG tablet; Take 1 tablet (50 mg total) by mouth every 8 (eight)  hours as needed.  Dispense: 60 tablet; Refill: 1 - meloxicam (MOBIC) 7.5 MG tablet; Take 1 tablet (7.5 mg total) by mouth daily.  Dispense: 30 tablet; Refill: 3 - gabapentin (NEURONTIN) 300 MG capsule; Take 1 capsule (300 mg total) by mouth 2 (two) times daily.  Dispense: 60 capsule; Refill: 5 - MR Lumbar Spine Wo Contrast; Future  4. Prediabetes Controlled with A1c of 6.0 Lifestyle modification to prevent development of Diabetes - HgB A1c   Meds ordered this encounter  Medications  . traMADol (ULTRAM) 50 MG tablet    Sig: Take 1 tablet (50 mg total) by mouth every 8 (eight) hours as needed.    Dispense:  60 tablet    Refill:  1  . valsartan (DIOVAN) 160 MG tablet    Sig: Take 1 tablet (160 mg total) by mouth daily.    Dispense:  30 tablet    Refill:  5  . meloxicam (MOBIC) 7.5 MG tablet    Sig: Take 1 tablet (7.5 mg total) by mouth daily.     Dispense:  30 tablet    Refill:  3  . busPIRone (BUSPAR) 7.5 MG tablet    Sig: Take 1 tablet (7.5 mg total) by mouth 2 (two) times daily.    Dispense:  60 tablet    Refill:  5  . amLODipine (NORVASC) 10 MG tablet    Sig: Take 1 tablet (10 mg total) by mouth daily.    Dispense:  30 tablet    Refill:  5  . gabapentin (NEURONTIN) 300 MG capsule    Sig: Take 1 capsule (300 mg total) by mouth 2 (two) times daily.    Dispense:  60 capsule    Refill:  5    Follow-up: Return in about 1 month (around 09/20/2016) for Follow-up on low back pain.   Arnoldo Morale MD

## 2016-08-21 NOTE — BH Specialist Note (Signed)
Session Start time: 10:10 am   End Time: 10:30 am Total Time:  20 minutes Type of Service: [place] Interpreter: No.   Interpreter Name & Language: N/A # [facility] Visits July 2017-June 2018: 1st   SUBJECTIVE: Luis Henderson is a 60 y.o. male  Pt. was referred by Dr. Jarold Song for:  anxiety and depression. Pt. reports the following symptoms/concerns: difficulty sleeping and racing thoughts Duration of problem:  "one year" Severity: moderate Previous treatment: Pt has received therapy and med management in past. Currently not receiving services.   OBJECTIVE: Mood: Depressed & Affect: Depressed Risk of harm to self or others: Pt denied SI/HI Assessments administered: PHQ-9; GAD-7  LIFE CONTEXT:  Family & Social: Pt resides with elderly mother. Pt has a sister who resides nearby, provides limited support School/ Work: Pt is unemployed Self-Care: Pt has difficulty sleeping. Has began snacking on candy as a form of coping with stress Life changes: Pt is providing primary care for parent diagnosed with dementia. Pt receives limited support What is important to pt/family (values): Family   GOALS ADDRESSED:  Decrease symptoms of depression Decrease symptoms of anxiety Obtain community resources  INTERVENTIONS: Solution Focused, Strength-based and Supportive   ASSESSMENT:  Pt currently experiencing depression and anxiety triggered by chronic pain and providing care to parent diagnosed with dementia. Pt reports difficulty sleeping, racing thoughts, and unhealthy eating habits to cope with stress.  Pt may benefit from psychoeducation, psychotherapy, and medication management. [place] educated pt on the cycle of depression and anxiety. Discussed healthy coping skills to decrease symptoms of anxiety and depression. Pt was provided community resources to provide caregiver support, in addition, to crisis intervention.     PLAN: 1. F/U with behavioral health clinician:  Pt was encouraged to contact [place] if symptoms worsen or fail to improve to schedule behavioral appointments at [facility]. 2. Behavioral Health meds: Buspar 3. Behavioral recommendations: LCSWA recommends that pt apply healthy coping skills discussed. Pt is encouraged to schedule follow up appointment with LCSWA 4. Referral: Brief Counseling/Psychotherapy, [employer], Psychoeducation and Supportive Counseling 5. From scale of 1-10, how likely are you to follow plan: 7/10   Rebekah Chesterfield, MSW, LCSWA  Clinical Social Worker 08/21/16 2:36 pm  Warmhandoff:   Warm Hand Off Completed.

## 2016-08-21 NOTE — Patient Instructions (Signed)

## 2016-08-21 NOTE — Progress Notes (Signed)
Patient would like a repeat MRI Patient is incontinent of bowel and bladder - ruining his mattress  Wearing depends daily Patient is not taking any of his medication regularly He continues to take care of his mother who has dementia

## 2016-08-29 ENCOUNTER — Emergency Department (HOSPITAL_COMMUNITY): Payer: Self-pay

## 2016-08-29 ENCOUNTER — Encounter (HOSPITAL_COMMUNITY): Payer: Self-pay

## 2016-08-29 ENCOUNTER — Emergency Department (HOSPITAL_COMMUNITY)
Admission: EM | Admit: 2016-08-29 | Discharge: 2016-08-29 | Disposition: A | Discharge status: Home / Self Care | Payer: Self-pay | Attending: Emergency Medicine | Admitting: Emergency Medicine

## 2016-08-29 ENCOUNTER — Ambulatory Visit (HOSPITAL_COMMUNITY)
Admission: RE | Admit: 2016-08-29 | Discharge: 2016-08-29 | Disposition: A | Discharge status: Home / Self Care | Payer: Self-pay | Source: Ambulatory Visit | Attending: Family Medicine | Admitting: Family Medicine

## 2016-08-29 DIAGNOSIS — E119 Type 2 diabetes mellitus without complications: Secondary | ICD-10-CM | POA: Insufficient documentation

## 2016-08-29 DIAGNOSIS — M25521 Pain in right elbow: Secondary | ICD-10-CM | POA: Insufficient documentation

## 2016-08-29 DIAGNOSIS — Y999 Unspecified external cause status: Secondary | ICD-10-CM | POA: Insufficient documentation

## 2016-08-29 DIAGNOSIS — M5441 Lumbago with sciatica, right side: Principal | ICD-10-CM

## 2016-08-29 DIAGNOSIS — F17210 Nicotine dependence, cigarettes, uncomplicated: Secondary | ICD-10-CM | POA: Insufficient documentation

## 2016-08-29 DIAGNOSIS — I10 Essential (primary) hypertension: Secondary | ICD-10-CM | POA: Insufficient documentation

## 2016-08-29 DIAGNOSIS — M25551 Pain in right hip: Secondary | ICD-10-CM | POA: Insufficient documentation

## 2016-08-29 DIAGNOSIS — W010XXA Fall on same level from slipping, tripping and stumbling without subsequent striking against object, initial encounter: Secondary | ICD-10-CM | POA: Insufficient documentation

## 2016-08-29 DIAGNOSIS — Z7982 Long term (current) use of aspirin: Secondary | ICD-10-CM | POA: Insufficient documentation

## 2016-08-29 DIAGNOSIS — Y9289 Other specified places as the place of occurrence of the external cause: Secondary | ICD-10-CM | POA: Insufficient documentation

## 2016-08-29 DIAGNOSIS — R079 Chest pain, unspecified: Secondary | ICD-10-CM | POA: Insufficient documentation

## 2016-08-29 DIAGNOSIS — M5442 Lumbago with sciatica, left side: Principal | ICD-10-CM

## 2016-08-29 DIAGNOSIS — G8929 Other chronic pain: Secondary | ICD-10-CM

## 2016-08-29 DIAGNOSIS — Y939 Activity, unspecified: Secondary | ICD-10-CM | POA: Insufficient documentation

## 2016-08-29 DIAGNOSIS — Z5321 Procedure and treatment not carried out due to patient leaving prior to being seen by health care provider: Secondary | ICD-10-CM | POA: Insufficient documentation

## 2016-08-29 DIAGNOSIS — R52 Pain, unspecified: Secondary | ICD-10-CM

## 2016-08-29 NOTE — ED Notes (Signed)
Pt's cell phone was called. No answer.

## 2016-08-29 NOTE — ED Notes (Addendum)
Patient transported by x ray to MRI

## 2016-08-29 NOTE — ED Notes (Signed)
MRI called x2  to locate the patient. They state that pt was transferred to RM 02 fast track. No pt is in the room

## 2016-08-29 NOTE — ED Notes (Signed)
Patient currently in xray ?

## 2016-08-29 NOTE — ED Triage Notes (Signed)
Pt fell on premises coming in for an MRI of lumbar spine. No LOC, no dizziness. Pt reports right hip pain and right elbow pain which are new following the fall.

## 2016-08-29 NOTE — ED Notes (Signed)
Waiting area checked. No patient present

## 2016-08-29 NOTE — ED Notes (Signed)
Waiting area, sub waiting area checked. No pt present. Charge nurse called.

## 2016-08-29 NOTE — ED Notes (Signed)
Not in the room at this time

## 2016-08-31 ENCOUNTER — Emergency Department (HOSPITAL_COMMUNITY)
Admission: EM | Admit: 2016-08-31 | Discharge: 2016-08-31 | Disposition: A | Discharge status: Home / Self Care | Payer: Self-pay | Attending: Emergency Medicine | Admitting: Emergency Medicine

## 2016-08-31 ENCOUNTER — Encounter (HOSPITAL_COMMUNITY): Payer: Self-pay

## 2016-08-31 ENCOUNTER — Emergency Department (HOSPITAL_COMMUNITY): Payer: Self-pay

## 2016-08-31 DIAGNOSIS — W010XXA Fall on same level from slipping, tripping and stumbling without subsequent striking against object, initial encounter: Secondary | ICD-10-CM | POA: Insufficient documentation

## 2016-08-31 DIAGNOSIS — Y929 Unspecified place or not applicable: Secondary | ICD-10-CM | POA: Insufficient documentation

## 2016-08-31 DIAGNOSIS — Y939 Activity, unspecified: Secondary | ICD-10-CM | POA: Insufficient documentation

## 2016-08-31 DIAGNOSIS — Z7982 Long term (current) use of aspirin: Secondary | ICD-10-CM | POA: Insufficient documentation

## 2016-08-31 DIAGNOSIS — I10 Essential (primary) hypertension: Secondary | ICD-10-CM | POA: Insufficient documentation

## 2016-08-31 DIAGNOSIS — F17210 Nicotine dependence, cigarettes, uncomplicated: Secondary | ICD-10-CM | POA: Insufficient documentation

## 2016-08-31 DIAGNOSIS — R0781 Pleurodynia: Secondary | ICD-10-CM | POA: Insufficient documentation

## 2016-08-31 DIAGNOSIS — Y999 Unspecified external cause status: Secondary | ICD-10-CM | POA: Insufficient documentation

## 2016-08-31 DIAGNOSIS — Z79899 Other long term (current) drug therapy: Secondary | ICD-10-CM | POA: Insufficient documentation

## 2016-08-31 DIAGNOSIS — E119 Type 2 diabetes mellitus without complications: Secondary | ICD-10-CM | POA: Insufficient documentation

## 2016-08-31 NOTE — ED Provider Notes (Signed)
[facility] DEPT Provider Note   CSN: [id number] Arrival date & time: 08/31/16  [id number]     History   Chief Complaint Chief Complaint  Patient presents with  . Fall    HPI Luis Henderson is a 60 y.o. male.  HPI  60 year old male presents with rib pain. Patient was in the hospital with scheduled MRI for chronic back pain. Patient had a fall on the 14th( 2 days ago) when he suffered an injury to his ribs. Patient reports he had other injuries at that time which haven't improved. He notes that he had plain films that showed no acute abnormality. Patient reports he continues to have pain in the right lateral ribs with addition of very minor cough. Patient denies any fever or chills, shortness of breath or chest pain. He denies any other concerning signs or symptoms at this time.     Past Medical History:  Diagnosis Date  . Anxiety   . Depression   . Diabetes mellitus   . Hyperlipidemia   . Hypertension     Patient Active Problem List   Diagnosis Date Noted  . Low back pain 06/09/2016  . Prediabetes 05/18/2016  . HTN (hypertension) 04/01/2015  . Anxiety 04/01/2015  . Cough 02/06/2012  . HBP (high blood pressure) 02/06/2012    Past Surgical History:  Procedure Laterality Date  . CYSTOSCOPY WITH RETROGRADE PYELOGRAM, URETEROSCOPY AND STENT PLACEMENT Right 01/15/2015   Procedure: CYSTOSCOPY, RIGHT URETEROSCOPY/ RETROGRADE PYELOGRAM,HOLMIUM LASER/ LITHOTRIPSY, STENT PLACEMENT, URETERAL BALLOON DIALATION;  Surgeon: Kathie Rhodes, MD;  Location: [facility];  Service: Urology;  Laterality: Right;  . SKIN GRAFT     left hand  . WISDOM TOOTH EXTRACTION         Home Medications    Prior to Admission medications   Medication Sig Start Date End Date Taking? Authorizing Provider  amLODipine (NORVASC) 10 MG tablet Take 1 tablet (10 mg total) by mouth daily. 08/21/16  Yes Arnoldo Morale, MD  aspirin 81 MG tablet Take 81 mg by mouth daily.   Yes Historical Provider, MD    atorvastatin (LIPITOR) 20 MG tablet Take 1 tablet (20 mg total) by mouth daily. 04/14/16  Yes Dionne Bucy McClung, PA-C  busPIRone (BUSPAR) 7.5 MG tablet Take 1 tablet (7.5 mg total) by mouth 2 (two) times daily. 08/21/16  Yes Arnoldo Morale, MD  cyclobenzaprine (FLEXERIL) 10 MG tablet Take 1 tablet (10 mg total) by mouth at bedtime. 05/18/16  Yes Arnoldo Morale, MD  traMADol (ULTRAM) 50 MG tablet Take 1 tablet (50 mg total) by mouth every 8 (eight) hours as needed. Patient taking differently: Take 50 mg by mouth every 8 (eight) hours as needed for moderate pain.  08/21/16  Yes Arnoldo Morale, MD  valsartan (DIOVAN) 160 MG tablet Take 1 tablet (160 mg total) by mouth daily. 08/21/16  Yes Arnoldo Morale, MD  gabapentin (NEURONTIN) 300 MG capsule Take 1 capsule (300 mg total) by mouth 2 (two) times daily. Patient not taking: Reported on 08/31/2016 08/21/16   Arnoldo Morale, MD  meloxicam (MOBIC) 7.5 MG tablet Take 1 tablet (7.5 mg total) by mouth daily. Patient not taking: Reported on 08/31/2016 08/21/16   Arnoldo Morale, MD    Family History Family History  Problem Relation Age of Onset  . Hyperlipidemia Father     Social History Social History  Substance Use Topics  . Smoking status: Current Every Day Smoker    Packs/day: 0.50    Years: 28.00    Types: Cigarettes  Last attempt to quit: 10/17/1999  . Smokeless tobacco: Never Used  . Alcohol use 3.0 - 3.6 oz/week    5 - 6 Cans of beer per week     Comment: daily alcohol/drinks beer, wine, and liquor     Allergies   Codeine and Lisinopril   Review of Systems Review of Systems  All other systems reviewed and are negative.  Physical Exam Updated Vital Signs BP 151/96   Pulse 71   Temp 98.2 F (36.8 C) (Oral)   Resp 16   Ht 5\' 11"  (1.803 m)   Wt 110.2 kg   SpO2 99%   BMI 33.89 kg/m   Physical Exam  Constitutional: He is oriented to person, place, and time. He appears well-developed and well-nourished.  HENT:  Head: Normocephalic and  atraumatic.  Eyes: Conjunctivae are normal. Pupils are equal, round, and reactive to light. Right eye exhibits no discharge. Left eye exhibits no discharge. No scleral icterus.  Neck: Normal range of motion. No JVD present. No tracheal deviation present.  Pulmonary/Chest: Effort normal. No stridor.  Tenderness to palpation of right lateral lower ribs. No crepitus, lung expansion normal, no signs of trauma. Normal breath sounds  Abdominal: Soft.  Neurological: He is alert and oriented to person, place, and time. Coordination normal.  Psychiatric: He has a normal mood and affect. His behavior is normal. Judgment and thought content normal.  Nursing note and vitals reviewed.    ED Treatments / Results  Labs (all labs ordered are listed, but only abnormal results are displayed) Labs Reviewed - No data to display  EKG  EKG Interpretation None       Radiology Dg Chest 2 View  Result Date: 08/29/2016 CLINICAL DATA:  Tripped and fell coming into the hospital for an MR today, RIGHT hip pain, RIGHT elbow pain, RIGHT chest pain, initial encounter EXAM: CHEST  2 VIEW COMPARISON:  01/25/2012 FINDINGS: Normal heart size, mediastinal contours, and pulmonary vascularity. Atherosclerotic calcification aorta. Chronic central peribronchial thickening. Lungs otherwise clear. No pulmonary infiltrate, pleural effusion or pneumothorax. No acute osseous findings. IMPRESSION: Mild chronic bronchitic changes. No acute abnormalities. Aortic atherosclerosis. Electronically Signed   By: Lavonia Dana M.D.   On: 08/29/2016 17:22   Dg Ribs Unilateral W/chest Right  Result Date: 08/31/2016 CLINICAL DATA:  Status post fall last week with persistent right-sided anterior rib cage pain and shortness of breath. EXAM: RIGHT RIBS AND CHEST - 3+ VIEW COMPARISON:  Chest x-ray of August 29, 2016 FINDINGS: The lungs are adequately inflated. The interstitial markings are coarse though stable. There is no alveolar infiltrate or  pleural effusion. There is no pneumothorax. The heart and pulmonary vascularity are normal. There is calcification in the wall of the aortic arch. Right rib detail images include a metallic BB over the symptomatic lower lateral ribs. No acute or old rib fracture is observed. IMPRESSION: No acute right rib fracture is observed. There is no pneumothorax or pleural effusion or other acute cardiopulmonary abnormality. Stable chronic bronchitic changes. Thoracic aortic atherosclerosis. Electronically Signed   By: David  Martinique M.D.   On: 08/31/2016 11:17   Dg Elbow Complete Right  Result Date: 08/29/2016 CLINICAL DATA:  Tripped and fell coming into the hospital for an MR today, RIGHT hip pain, RIGHT elbow pain, RIGHT chest pain, initial encounter EXAM: RIGHT ELBOW - COMPLETE 3+ VIEW COMPARISON:  None FINDINGS: Bone mineralization normal. Joint spaces preserved. No fracture, dislocation, or bone destruction. No joint effusion. IMPRESSION: No acute osseous abnormalities. Electronically  Signed   By: Lavonia Dana M.D.   On: 08/29/2016 17:19   Mr Lumbar Spine Wo Contrast  Result Date: 08/29/2016 CLINICAL DATA:  Intermittent low back pain, occasion radiating to lower extremities. [place] today at outpatient appointment. EXAM: MRI LUMBAR SPINE WITHOUT CONTRAST TECHNIQUE: Multiplanar, multisequence MR imaging of the lumbar spine was performed. No intravenous contrast was administered. COMPARISON:  MRI lumbar spine October 21, 2014 FINDINGS: SEGMENTATION: For the purposes of this report, the last well-formed intervertebral disc will be described as L5-S1. ALIGNMENT: Maintenance of the lumbar lordosis. Stable grade 1 (3 mm) L4-5 anterolisthesis. No spondylolysis. VERTEBRAE:Vertebral bodies are intact. Mild L3-4, L4-5 disc height loss. Mild narrowing of the L5-S1 disc is likely developmental, unchanged. Slight decreased T2 signal in the lower lumbar disc compatible with desiccation. Stable faint bone marrow edema RIGHT L5  facet. CONUS MEDULLARIS: Conus medullaris terminates at T12-L1 and demonstrates normal morphology and signal characteristics. Cauda equina is normal. PARASPINAL AND SOFT TISSUES: Included prevertebral and paraspinal soft tissues are nonacute. Partially imaged large LEFT renal cyst. DISC LEVELS: L1-2 and L2-3: No disc bulge, canal stenosis nor neural foraminal narrowing. L3-4: Stable annular bulging. Mild facet arthropathy and ligamentum flavum redundancy without canal stenosis or neural foraminal narrowing. L4-5: Anterolisthesis. Severe facet arthropathy with ligamentum flavum redundancy. No canal stenosis. Mild RIGHT greater than LEFT neural foraminal narrowing. L5-S1: No disc bulge. Severe RIGHT and moderate LEFT facet arthropathy. No canal stenosis or neural foraminal narrowing. IMPRESSION: Stable examination including grade 1 L4-5 anterolisthesis on degenerative basis. Stable bone marrow edema RIGHT L5 facet is likely reactive. No canal stenosis.  Mild L4-5 neural foraminal narrowing. Electronically Signed   By: Elon Alas M.D.   On: 08/29/2016 20:30   Dg Hip Unilat  With Pelvis 2-3 Views Right  Result Date: 08/29/2016 CLINICAL DATA:  Tripped and fell coming into the hospital for an MR today, RIGHT hip pain, RIGHT elbow pain, RIGHT chest pain, initial encounter EXAM: DG HIP (WITH OR WITHOUT PELVIS) 2-3V RIGHT COMPARISON:  None. FINDINGS: Osseous mineralization grossly normal. Hip and SI joint spaces preserved. No acute fracture, dislocation, or bone destruction. IMPRESSION: No acute osseous abnormalities. Electronically Signed   By: Lavonia Dana M.D.   On: 08/29/2016 17:21    Procedures Procedures (including critical care time)  Medications Ordered in ED Medications - No data to display   Initial Impression / Assessment and Plan / ED Course  I have reviewed the triage vital signs and the nursing notes.  Pertinent labs & imaging results that were available during my care of the patient  were reviewed by me and considered in my medical decision making (see chart for details).  Clinical Course     Final Clinical Impressions(s) / ED Diagnoses   Final diagnoses:  Rib pain    Labs:  Imaging:  Consults:  Therapeutics:  Discharge Meds:   Assessment/Plan:   60 year old male presents for reevaluation of rib pain. Patient has no acute fracture, no signs of infectious etiology, clear lung sounds. Patient reports significant pain, but at times is laying on the right side. Patient has no other acute findings that would necessitate further evaluation or management here in the ED. These discharged home with [facility] care follow-up and strict return precautions. He verbalizes understanding and agreement to today's plan.     New Prescriptions Discharge Medication List as of 08/31/2016 11:42 AM       Okey Regal, PA-C 08/31/16 [place], MD 09/02/16 1119

## 2016-08-31 NOTE — ED Notes (Signed)
Bed: [id number] Expected date:  Expected time:  Means of arrival:  Comments: 60 yo M, fall, difficulty walking

## 2016-08-31 NOTE — ED Triage Notes (Signed)
Per EMS- Patient reported that he slid out of bed sometime in the night and laid there until found by his mother this AM. Patient reports that he has had bilateral leg weakness x 3 months that has been getting progressively worse. Patient c/o right rib cage pain.

## 2016-08-31 NOTE — Discharge Instructions (Signed)
Please read attached information. If you experience any new or worsening signs or symptoms please return to the emergency room for evaluation. Please follow-up with your primary care provider or specialist as discussed.  °

## 2016-09-03 ENCOUNTER — Encounter (HOSPITAL_COMMUNITY): Payer: Self-pay | Admitting: Emergency Medicine

## 2016-09-03 ENCOUNTER — Emergency Department (HOSPITAL_COMMUNITY): Payer: Self-pay

## 2016-09-03 ENCOUNTER — Inpatient Hospital Stay (HOSPITAL_COMMUNITY)
Admission: EM | Admit: 2016-09-03 | Discharge: 2016-09-08 | DRG: 031 | Disposition: A | Discharge status: Home / Self Care | Payer: Self-pay | Attending: Neurosurgery | Admitting: Neurosurgery

## 2016-09-03 DIAGNOSIS — Z7982 Long term (current) use of aspirin: Secondary | ICD-10-CM

## 2016-09-03 DIAGNOSIS — F411 Generalized anxiety disorder: Secondary | ICD-10-CM | POA: Diagnosis present

## 2016-09-03 DIAGNOSIS — G8929 Other chronic pain: Secondary | ICD-10-CM | POA: Diagnosis present

## 2016-09-03 DIAGNOSIS — G930 Cerebral cysts: Secondary | ICD-10-CM | POA: Diagnosis present

## 2016-09-03 DIAGNOSIS — Z79899 Other long term (current) drug therapy: Secondary | ICD-10-CM

## 2016-09-03 DIAGNOSIS — F32A Depression, unspecified: Secondary | ICD-10-CM

## 2016-09-03 DIAGNOSIS — F419 Anxiety disorder, unspecified: Secondary | ICD-10-CM

## 2016-09-03 DIAGNOSIS — R269 Unspecified abnormalities of gait and mobility: Secondary | ICD-10-CM

## 2016-09-03 DIAGNOSIS — I10 Essential (primary) hypertension: Secondary | ICD-10-CM | POA: Diagnosis present

## 2016-09-03 DIAGNOSIS — M545 Low back pain: Secondary | ICD-10-CM | POA: Diagnosis present

## 2016-09-03 DIAGNOSIS — G9340 Encephalopathy, unspecified: Secondary | ICD-10-CM | POA: Diagnosis present

## 2016-09-03 DIAGNOSIS — Z791 Long term (current) use of non-steroidal anti-inflammatories (NSAID): Secondary | ICD-10-CM

## 2016-09-03 DIAGNOSIS — R296 Repeated falls: Secondary | ICD-10-CM | POA: Diagnosis present

## 2016-09-03 DIAGNOSIS — R4182 Altered mental status, unspecified: Secondary | ICD-10-CM

## 2016-09-03 DIAGNOSIS — G919 Hydrocephalus, unspecified: Secondary | ICD-10-CM

## 2016-09-03 DIAGNOSIS — G911 Obstructive hydrocephalus: Principal | ICD-10-CM | POA: Diagnosis present

## 2016-09-03 DIAGNOSIS — E785 Hyperlipidemia, unspecified: Secondary | ICD-10-CM | POA: Diagnosis present

## 2016-09-03 DIAGNOSIS — F1010 Alcohol abuse, uncomplicated: Secondary | ICD-10-CM | POA: Diagnosis present

## 2016-09-03 DIAGNOSIS — Z720 Tobacco use: Secondary | ICD-10-CM

## 2016-09-03 DIAGNOSIS — W19XXXA Unspecified fall, initial encounter: Secondary | ICD-10-CM

## 2016-09-03 DIAGNOSIS — F17210 Nicotine dependence, cigarettes, uncomplicated: Secondary | ICD-10-CM | POA: Diagnosis present

## 2016-09-03 DIAGNOSIS — F329 Major depressive disorder, single episode, unspecified: Secondary | ICD-10-CM

## 2016-09-03 HISTORY — DX: Personal history of other specified conditions: Z87.898

## 2016-09-03 LAB — CBC WITH DIFFERENTIAL/PLATELET
Basophils Absolute: 0.1 10*3/uL (ref 0.0–0.1)
Basophils Relative: 1 %
EOS ABS: 0.1 10*3/uL (ref 0.0–0.7)
EOS PCT: 1 %
HCT: 51.2 % (ref 39.0–52.0)
Hemoglobin: 17.4 g/dL — ABNORMAL HIGH (ref 13.0–17.0)
LYMPHS ABS: 2.2 10*3/uL (ref 0.7–4.0)
Lymphocytes Relative: 22 %
MCH: 30.7 pg (ref 26.0–34.0)
MCHC: 34 g/dL (ref 30.0–36.0)
MCV: 90.3 fL (ref 78.0–100.0)
MONOS PCT: 10 %
Monocytes Absolute: 1 10*3/uL (ref 0.1–1.0)
Neutro Abs: 6.6 10*3/uL (ref 1.7–7.7)
Neutrophils Relative %: 66 %
PLATELETS: 264 10*3/uL (ref 150–400)
RBC: 5.67 MIL/uL (ref 4.22–5.81)
RDW: 12.9 % (ref 11.5–15.5)
WBC: 10 10*3/uL (ref 4.0–10.5)

## 2016-09-03 LAB — URINALYSIS, ROUTINE W REFLEX MICROSCOPIC
Glucose, UA: NEGATIVE mg/dL
Ketones, ur: NEGATIVE mg/dL
Leukocytes, UA: NEGATIVE
Nitrite: NEGATIVE
PROTEIN: NEGATIVE mg/dL
Specific Gravity, Urine: 1.031 — ABNORMAL HIGH (ref 1.005–1.030)
pH: 5 (ref 5.0–8.0)

## 2016-09-03 LAB — COMPREHENSIVE METABOLIC PANEL
ALT: 37 U/L (ref 17–63)
AST: 33 U/L (ref 15–41)
Albumin: 4 g/dL (ref 3.5–5.0)
Alkaline Phosphatase: 80 U/L (ref 38–126)
Anion gap: 9 (ref 5–15)
BILIRUBIN TOTAL: 1.3 mg/dL — AB (ref 0.3–1.2)
BUN: 17 mg/dL (ref 6–20)
CO2: 25 mmol/L (ref 22–32)
CREATININE: 1.29 mg/dL — AB (ref 0.61–1.24)
Calcium: 9.7 mg/dL (ref 8.9–10.3)
Chloride: 105 mmol/L (ref 101–111)
GFR, EST NON AFRICAN AMERICAN: 58 mL/min — AB (ref >60)
Glucose, Bld: 103 mg/dL — ABNORMAL HIGH (ref 65–99)
POTASSIUM: 4.1 mmol/L (ref 3.5–5.1)
Sodium: 139 mmol/L (ref 135–145)
TOTAL PROTEIN: 6.7 g/dL (ref 6.5–8.1)

## 2016-09-03 LAB — PROTIME-INR
INR: 1.06
Prothrombin Time: 13.8 seconds (ref 11.4–15.2)

## 2016-09-03 LAB — URINE MICROSCOPIC-ADD ON
Bacteria, UA: NONE SEEN
Squamous Epithelial / LPF: NONE SEEN
WBC UA: NONE SEEN WBC/hpf (ref 0–5)

## 2016-09-03 LAB — RAPID URINE DRUG SCREEN, HOSP PERFORMED
Amphetamines: NOT DETECTED
Barbiturates: NOT DETECTED
Benzodiazepines: NOT DETECTED
Cocaine: NOT DETECTED
Opiates: NOT DETECTED
Tetrahydrocannabinol: POSITIVE — AB

## 2016-09-03 LAB — I-STAT CHEM 8, ED
BUN: 20 mg/dL (ref 6–20)
CHLORIDE: 103 mmol/L (ref 101–111)
Calcium, Ion: 1.15 mmol/L (ref 1.15–1.40)
Creatinine, Ser: 1.2 mg/dL (ref 0.61–1.24)
Glucose, Bld: 110 mg/dL — ABNORMAL HIGH (ref 65–99)
HEMATOCRIT: 51 % (ref 39.0–52.0)
Hemoglobin: 17.3 g/dL — ABNORMAL HIGH (ref 13.0–17.0)
Potassium: 4.1 mmol/L (ref 3.5–5.1)
SODIUM: 138 mmol/L (ref 135–145)
TCO2: 26 mmol/L (ref 0–100)

## 2016-09-03 LAB — GLUCOSE, CAPILLARY: Glucose-Capillary: 97 mg/dL (ref 65–99)

## 2016-09-03 LAB — ETHANOL

## 2016-09-03 LAB — LIPASE, BLOOD: LIPASE: 26 U/L (ref 11–51)

## 2016-09-03 MED ORDER — SODIUM CHLORIDE 0.9 % IV SOLN
INTRAVENOUS | Status: DC
  Administered 2016-09-03: 17:00:00 via INTRAVENOUS

## 2016-09-03 MED ORDER — BUSPIRONE HCL 15 MG PO TABS
7.5000 mg | ORAL_TABLET | Freq: Two times a day (BID) | ORAL | Status: DC
  Administered 2016-09-03 – 2016-09-08 (×6): 7.5 mg via ORAL
  Filled 2016-09-03 (×10): qty 1

## 2016-09-03 MED ORDER — ATORVASTATIN CALCIUM 10 MG PO TABS
20.0000 mg | ORAL_TABLET | Freq: Every day | ORAL | Status: DC
  Administered 2016-09-05 – 2016-09-07 (×3): 20 mg via ORAL
  Filled 2016-09-03 (×3): qty 2

## 2016-09-03 MED ORDER — SODIUM CHLORIDE 0.9 % IV SOLN
INTRAVENOUS | Status: AC
  Administered 2016-09-03: 23:00:00 via INTRAVENOUS
  Administered 2016-09-04: 1000 mL via INTRAVENOUS

## 2016-09-03 MED ORDER — ONDANSETRON HCL 4 MG/2ML IJ SOLN: 4.0000 mg | Freq: Four times a day (QID) | INTRAMUSCULAR | Status: DC | PRN

## 2016-09-03 MED ORDER — SODIUM CHLORIDE 0.9 % IV BOLUS (SEPSIS)
250.0000 mL | Freq: Once | INTRAVENOUS | Status: AC
  Administered 2016-09-03: 250 mL via INTRAVENOUS

## 2016-09-03 MED ORDER — ACETAMINOPHEN 650 MG RE SUPP: 650.0000 mg | Freq: Four times a day (QID) | RECTAL | Status: DC | PRN

## 2016-09-03 MED ORDER — GABAPENTIN 300 MG PO CAPS
300.0000 mg | ORAL_CAPSULE | Freq: Two times a day (BID) | ORAL | Status: DC
  Administered 2016-09-03 – 2016-09-08 (×9): 300 mg via ORAL
  Filled 2016-09-03 (×9): qty 1

## 2016-09-03 MED ORDER — IRBESARTAN 150 MG PO TABS
150.0000 mg | ORAL_TABLET | Freq: Every day | ORAL | Status: DC
  Administered 2016-09-04 – 2016-09-08 (×5): 150 mg via ORAL
  Filled 2016-09-03 (×5): qty 1

## 2016-09-03 MED ORDER — AMLODIPINE BESYLATE 10 MG PO TABS
10.0000 mg | ORAL_TABLET | Freq: Every day | ORAL | Status: DC
  Administered 2016-09-04 – 2016-09-08 (×5): 10 mg via ORAL
  Filled 2016-09-03 (×5): qty 1

## 2016-09-03 MED ORDER — ONDANSETRON HCL 4 MG PO TABS: 4.0000 mg | ORAL_TABLET | Freq: Four times a day (QID) | ORAL | Status: DC | PRN

## 2016-09-03 MED ORDER — ACETAMINOPHEN 325 MG PO TABS: 650.0000 mg | ORAL_TABLET | Freq: Four times a day (QID) | ORAL | Status: DC | PRN

## 2016-09-03 MED ORDER — THIAMINE HCL 100 MG/ML IJ SOLN
100.0000 mg | Freq: Every day | INTRAMUSCULAR | Status: DC
  Administered 2016-09-03 – 2016-09-06 (×4): 100 mg via INTRAVENOUS
  Filled 2016-09-03 (×4): qty 2

## 2016-09-03 NOTE — ED Notes (Signed)
Pt transported to CT ?

## 2016-09-03 NOTE — ED Notes (Signed)
Informed pt we need a urine sample, pt going to try to give one.

## 2016-09-03 NOTE — ED Provider Notes (Signed)
[facility] DEPT Provider Note   CSN: [id number] Arrival date & time: 09/03/16  1526     History   Chief Complaint Chief Complaint  Patient presents with  . Gait Problem    HPI Luis Henderson is a 60 y.o. male.  Patient brought in by EMS. Family stating that his walking and weakness is gotten very severe. Patient found on the floor by the son. Apparently patient has not walked normal since 6 months ago. But things got significantly worse in the past few weeks. Patient's had day bowels for falls 2. Followed by the [facility] here at [facility] did have a MRI of his back without any significant findings to explain the leg weakness. Patient has not had an MRI of his brain or CT of his head. Family is also noting that has been altered mental status changes. Possibly could be related to dementia. Patient has become combative at times. Patient is known to have low back pain. That's been evaluated in the past and I thought there was a connection with that. Seen at the wellness clinic this month. Seen in the emergency department on November 16 for fall with rib pain with negative rib series. Seen on November 14 for fall but he eloped. Seen in the urgent care on August 2 for back pain. No history of any fevers no history of any nausea vomiting or diarrhea.      Past Medical History:  Diagnosis Date  . Anxiety   . Depression   . Diabetes mellitus   . Hyperlipidemia   . Hypertension     Patient Active Problem List   Diagnosis Date Noted  . Low back pain 06/09/2016  . Prediabetes 05/18/2016  . HTN (hypertension) 04/01/2015  . Anxiety 04/01/2015  . Cough 02/06/2012  . HBP (high blood pressure) 02/06/2012    Past Surgical History:  Procedure Laterality Date  . CYSTOSCOPY WITH RETROGRADE PYELOGRAM, URETEROSCOPY AND STENT PLACEMENT Right 01/15/2015   Procedure: CYSTOSCOPY, RIGHT URETEROSCOPY/ RETROGRADE PYELOGRAM,HOLMIUM LASER/ LITHOTRIPSY, STENT PLACEMENT, URETERAL BALLOON  DIALATION;  Surgeon: Kathie Rhodes, MD;  Location: [facility];  Service: Urology;  Laterality: Right;  . SKIN GRAFT     left hand  . WISDOM TOOTH EXTRACTION         Home Medications    Prior to Admission medications   Medication Sig Start Date End Date Taking? Authorizing Provider  amLODipine (NORVASC) 10 MG tablet Take 1 tablet (10 mg total) by mouth daily. 08/21/16   Arnoldo Morale, MD  aspirin 81 MG tablet Take 81 mg by mouth daily.    Historical Provider, MD  atorvastatin (LIPITOR) 20 MG tablet Take 1 tablet (20 mg total) by mouth daily. 04/14/16   Argentina Donovan, PA-C  busPIRone (BUSPAR) 7.5 MG tablet Take 1 tablet (7.5 mg total) by mouth 2 (two) times daily. 08/21/16   Arnoldo Morale, MD  cyclobenzaprine (FLEXERIL) 10 MG tablet Take 1 tablet (10 mg total) by mouth at bedtime. 05/18/16   Arnoldo Morale, MD  gabapentin (NEURONTIN) 300 MG capsule Take 1 capsule (300 mg total) by mouth 2 (two) times daily. Patient not taking: Reported on 08/31/2016 08/21/16   Arnoldo Morale, MD  meloxicam (MOBIC) 7.5 MG tablet Take 1 tablet (7.5 mg total) by mouth daily. Patient not taking: Reported on 08/31/2016 08/21/16   Arnoldo Morale, MD  traMADol (ULTRAM) 50 MG tablet Take 1 tablet (50 mg total) by mouth every 8 (eight) hours as needed. Patient taking differently: Take 50 mg by mouth  every 8 (eight) hours as needed for moderate pain.  08/21/16   Arnoldo Morale, MD  valsartan (DIOVAN) 160 MG tablet Take 1 tablet (160 mg total) by mouth daily. 08/21/16   Arnoldo Morale, MD    Family History Family History  Problem Relation Age of Onset  . Hyperlipidemia Father     Social History Social History  Substance Use Topics  . Smoking status: Current Every Day Smoker    Packs/day: 0.50    Years: 28.00    Types: Cigarettes    Last attempt to quit: 10/17/1999  . Smokeless tobacco: Never Used  . Alcohol use 3.0 - 3.6 oz/week    5 - 6 Cans of beer per week     Comment: daily alcohol/drinks beer, wine, and liquor      Allergies   Codeine and Lisinopril   Review of Systems Review of Systems  Unable to perform ROS: Mental status change  Psychiatric/Behavioral: Negative for confusion.     Physical Exam Updated Vital Signs BP (!) 171/103   Pulse 84   Temp 97.5 F (36.4 C) (Oral)   Resp 22   Ht 5\' 11"  (1.803 m)   Wt 108.9 kg   SpO2 96%   BMI 33.47 kg/m   Physical Exam  Constitutional: He is oriented to person, place, and time. He appears well-developed and well-nourished. No distress.  HENT:  Head: Normocephalic and atraumatic.  Mouth/Throat: Oropharynx is clear and moist.  Eyes: Conjunctivae and EOM are normal. Pupils are equal, round, and reactive to light.  Extraocular muscles intact the patient struggled to follow my finger. Patient seems to be intact able to recognize one or 2 fingers.  Neck: Normal range of motion. Neck supple.  Cardiovascular: Normal rate, regular rhythm and normal heart sounds.   Pulmonary/Chest: Effort normal and breath sounds normal. No respiratory distress. He exhibits tenderness.  Abdominal: Soft. Bowel sounds are normal.  Musculoskeletal: Normal range of motion. He exhibits no edema or deformity.  Neurological: He is alert and oriented to person, place, and time. No cranial nerve deficit or sensory deficit. He exhibits normal muscle tone.  Skin: Skin is warm.  Nursing note and vitals reviewed.    ED Treatments / Results  Labs (all labs ordered are listed, but only abnormal results are displayed) Labs Reviewed  COMPREHENSIVE METABOLIC PANEL  LIPASE, BLOOD  PROTIME-INR  CBC WITH DIFFERENTIAL/PLATELET  URINALYSIS, ROUTINE W REFLEX MICROSCOPIC (NOT AT [facility])  ETHANOL  RAPID URINE DRUG SCREEN, HOSP PERFORMED  I-STAT CHEM 8, ED    EKG  EKG Interpretation  Date/Time:  Sunday September 03 2016 16:08:22 EST Ventricular Rate:  82 PR Interval:    QRS Duration: 103 QT Interval:  361 QTC Calculation: 422 R Axis:   64 Text Interpretation:  Sinus  rhythm No previous ECGs available Confirmed by Renia Mikelson  MD, Vandella Ord ([id number]) on 09/03/2016 4:15:49 PM       Radiology No results found.  Procedures Procedures (including critical care time)  Medications Ordered in ED Medications  0.9 %  sodium chloride infusion (not administered)  sodium chloride 0.9 % bolus 250 mL (not administered)     Initial Impression / Assessment and Plan / ED Course  I have reviewed the triage vital signs and the nursing notes.  Pertinent labs & imaging results that were available during my care of the patient were reviewed by me and considered in my medical decision making (see chart for details).  Clinical Course    According to family members.  Patient with progressive gait abnormality over the past 3 months. Also there's been altered mental status abnormalities. Patient the had felt that it was due to low back problems. Recent MRI of the back done by the wellness clinic without any acute findings. Although was not completely normal. According to family patient has not walked normal now for 6 months. Patient with recent evaluations for falls. Was recently on November 16. Patient with a complaint of some rib pain x-rays of the ribs were negative. Wellness clinic had an MRI of the back as stated recently seen by them this month.  Patient has not had an MRI in our system of his head has not had a CT of his head.  Patient was found on the floor today. Not sure how he got there. They seems to be very weak and I watched him get out of the gurney his gait is very abnormal.   Workup here will be to evaluate this from an altered mental status change and and a rule out any of intracranial pathology.  Patient is alert struggle some to follow some commands. No obvious weakness to his lower extremities. But clearly gait is abnormal.  Final Clinical Impressions(s) / ED Diagnoses   Final diagnoses:  Abnormal gait  Altered mental status, unspecified altered mental  status type    New Prescriptions New Prescriptions   No medications on file     Fredia Sorrow, MD 09/03/16 1622

## 2016-09-03 NOTE — ED Triage Notes (Signed)
Per EMS: pt locked himself in the bathroom and needed to be coaxed out. Pt does not know why he locked himself in the bathroom. Pt family says he has been having increasing trouble with his gaite and his mind seems to wander. Pt has a hx of falls and was seen at [facility] last week for a MRI.  Pt is AOx3, not oriented to time.

## 2016-09-03 NOTE — ED Notes (Signed)
Pt's sister came up to nurses station, asking to speak with Dr. Rogene Houston. MD talking with the sister now and sister expresses her concern that pt lives by himself at this time and she wants him to be placed for a couple days. MD explains to her that there must be a reason why.

## 2016-09-03 NOTE — H&P (Signed)
History and Physical    Luis Henderson [id number] DOB: 06-19-1956 DOA: 09/03/2016  PCP: Arnoldo Morale, MD  Patient coming from: Home.  Chief Complaint: Confusion, difficulty ambulating and incontinence of urine.  HPI: Luis Henderson is a 60 y.o. male with history of hypertension, hyperlipidemia and previous history of alcohol abuse was brought to the ER after the patient was found to be having increasing confusion over the last 1 month. Patient also was found to have gait difficulties with broad-based gait over the last 6 months and incontinence of urine. As per the patient's sister who was providing the history patient had multiple falls during the last 1 week. Since patient also complained of low back pain patient had an MRI of L-spine done recently. In the ER CT head shows features concerning for obstructive hydrocephalus. Neurologist has been consulted and patient is being admitted for further management. On my exam patient is alert and awake oriented to his name and place. Moves all extremities.   ED Course: CT head done shows - Symmetric mild dilatation of the lateral ventricles. Mildly dilated third ventricle. Suggestion of a thin walled round cystic lesion between the midbrain and upper cerebellar hemispheres, just below the pineal gland region, cannot exclude that this cystic lesion may be a cause of obstructive hydrocephalus.  Review of Systems: As per HPI, rest all negative.   Past Medical History:  Diagnosis Date  . Anxiety   . Depression   . Diabetes mellitus   . Hyperlipidemia   . Hypertension     Past Surgical History:  Procedure Laterality Date  . CYSTOSCOPY WITH RETROGRADE PYELOGRAM, URETEROSCOPY AND STENT PLACEMENT Right 01/15/2015   Procedure: CYSTOSCOPY, RIGHT URETEROSCOPY/ RETROGRADE PYELOGRAM,HOLMIUM LASER/ LITHOTRIPSY, STENT PLACEMENT, URETERAL BALLOON DIALATION;  Surgeon: Kathie Rhodes, MD;  Location: [facility];  Service: Urology;  Laterality: Right;    . SKIN GRAFT     left hand  . WISDOM TOOTH EXTRACTION       reports that he has been smoking Cigarettes.  He has a 14.00 pack-year smoking history. He has never used smokeless tobacco. He reports that he drinks about 3.0 - 3.6 oz of alcohol per week . He reports that he does not use drugs.  Allergies  Allergen Reactions  . Codeine Itching  . Lisinopril Cough    Family History  Problem Relation Age of Onset  . Hyperlipidemia Father     Prior to Admission medications   Medication Sig Start Date End Date Taking? Authorizing Provider  amLODipine (NORVASC) 10 MG tablet Take 1 tablet (10 mg total) by mouth daily. 08/21/16  Yes Arnoldo Morale, MD  atorvastatin (LIPITOR) 20 MG tablet Take 1 tablet (20 mg total) by mouth daily. 04/14/16  Yes Dionne Bucy McClung, PA-C  busPIRone (BUSPAR) 7.5 MG tablet Take 1 tablet (7.5 mg total) by mouth 2 (two) times daily. 08/21/16  Yes Arnoldo Morale, MD  cyclobenzaprine (FLEXERIL) 10 MG tablet Take 1 tablet (10 mg total) by mouth at bedtime. 05/18/16  Yes Arnoldo Morale, MD  gabapentin (NEURONTIN) 300 MG capsule Take 1 capsule (300 mg total) by mouth 2 (two) times daily. 08/21/16  Yes Arnoldo Morale, MD  meloxicam (MOBIC) 7.5 MG tablet Take 1 tablet (7.5 mg total) by mouth daily. 08/21/16  Yes Arnoldo Morale, MD  traMADol (ULTRAM) 50 MG tablet Take 1 tablet (50 mg total) by mouth every 8 (eight) hours as needed. Patient taking differently: Take 50 mg by mouth every 8 (eight) hours as needed for moderate  pain.  08/21/16  Yes Arnoldo Morale, MD  valsartan (DIOVAN) 160 MG tablet Take 1 tablet (160 mg total) by mouth daily. 08/21/16  Yes Arnoldo Morale, MD  aspirin EC 81 MG tablet Take 81 mg by mouth daily.    Historical Provider, MD    Physical Exam: Vitals:   09/03/16 1845 09/03/16 1900 09/03/16 1945 09/03/16 2114  BP: 159/96 172/89 147/91 (!) 170/84  Pulse: 74  74 73  Resp: 16 18 25 18   Temp:    98.1 F (36.7 C)  TempSrc:      SpO2: 100% 94% 99% 100%  Weight:       Height:          Constitutional: Moderately built and nourished. Vitals:   09/03/16 1845 09/03/16 1900 09/03/16 1945 09/03/16 2114  BP: 159/96 172/89 147/91 (!) 170/84  Pulse: 74  74 73  Resp: 16 18 25 18   Temp:    98.1 F (36.7 C)  TempSrc:      SpO2: 100% 94% 99% 100%  Weight:      Height:       Eyes: Anicteric no pallor. ENMT: No discharge from the ears eyes nose or mouth. Neck: No mass felt. No neck rigidity. Respiratory: No rhonchi or crepitations. Cardiovascular: S1-S2 heard. No murmurs appreciated. Abdomen: Soft nontender bowel sounds present. No guarding or rigidity. Musculoskeletal: No edema. No joint effusion. Skin: Multiple bruises.  Neurologic: Alert awake oriented to his name and place. Moves all extremities. Psychiatric: Has mild confusion.   Labs on Admission: I have personally reviewed following labs and imaging studies  CBC:  Recent Labs Lab 09/03/16 1611 09/03/16 1620  WBC 10.0  --   NEUTROABS 6.6  --   HGB 17.4* 17.3*  HCT 51.2 51.0  MCV 90.3  --   PLT 264  --    Basic Metabolic Panel:  Recent Labs Lab 09/03/16 1611 09/03/16 1620  NA 139 138  K 4.1 4.1  CL 105 103  CO2 25  --   GLUCOSE 103* 110*  BUN 17 20  CREATININE 1.29* 1.20  CALCIUM 9.7  --    GFR: Estimated Creatinine Clearance: 81.1 mL/min (by C-G formula based on SCr of 1.2 mg/dL). Liver Function Tests:  Recent Labs Lab 09/03/16 1611  AST 33  ALT 37  ALKPHOS 80  BILITOT 1.3*  PROT 6.7  ALBUMIN 4.0    Recent Labs Lab 09/03/16 1611  LIPASE 26   No results for input(s): AMMONIA in the last 168 hours. Coagulation Profile:  Recent Labs Lab 09/03/16 1611  INR 1.06   Cardiac Enzymes: No results for input(s): CKTOTAL, CKMB, CKMBINDEX, TROPONINI in the last 168 hours. BNP (last 3 results) No results for input(s): PROBNP in the last 8760 hours. HbA1C: No results for input(s): HGBA1C in the last 72 hours. CBG: No results for input(s): GLUCAP in the last  168 hours. Lipid Profile: No results for input(s): CHOL, HDL, LDLCALC, TRIG, CHOLHDL, LDLDIRECT in the last 72 hours. Thyroid Function Tests: No results for input(s): TSH, T4TOTAL, FREET4, T3FREE, THYROIDAB in the last 72 hours. Anemia Panel: No results for input(s): VITAMINB12, FOLATE, FERRITIN, TIBC, IRON, RETICCTPCT in the last 72 hours. Urine analysis:    Component Value Date/Time   COLORURINE AMBER (A) 09/03/2016 1758   APPEARANCEUR CLOUDY (A) 09/03/2016 1758   LABSPEC 1.031 (H) 09/03/2016 1758   PHURINE 5.0 09/03/2016 1758   GLUCOSEU NEGATIVE 09/03/2016 1758   HGBUR SMALL (A) 09/03/2016 1758   BILIRUBINUR SMALL (A)  09/03/2016 [place] 09/03/2016 1758   PROTEINUR NEGATIVE 09/03/2016 1758   UROBILINOGEN 1.0 01/13/2015 2105   NITRITE NEGATIVE 09/03/2016 1758   LEUKOCYTESUR NEGATIVE 09/03/2016 1758   Sepsis Labs: @LABRCNTIP (procalcitonin:4,lacticidven:4) )No results found for this or any previous visit (from the past 240 hour(s)).   Radiological Exams on Admission: Ct Head Wo Contrast  Result Date: 09/03/2016 CLINICAL DATA:  Abnormal gait.  Confusion. EXAM: CT HEAD WITHOUT CONTRAST TECHNIQUE: Contiguous axial images were obtained from the base of the skull through the vertex without intravenous contrast. COMPARISON:  None. FINDINGS: Brain: There is symmetric mild dilatation of the lateral ventricles bilaterally. The third ventricle is mildly dilated. The fourth ventricle is normal size. There is a suggestion of a thin-walled round cystic lesion between the midbrain and upper cerebellar hemispheres, just below the pineal gland region (series 201/ image 12). No evidence of parenchymal hemorrhage or extra-axial fluid collection. No midline shift. No CT evidence of acute infarction. Intracranial atherosclerosis. There is moderate bilateral periventricular white matter hypodensity. Cerebral volume is age appropriate. Vascular: No hyperdense vessel or unexpected  calcification. Skull: No evidence of calvarial fracture. Sinuses/Orbits: The visualized paranasal sinuses are essentially clear. Other:  The mastoid air cells are unopacified. IMPRESSION: 1. Symmetric mild dilatation of the lateral ventricles. Mildly dilated third ventricle. Suggestion of a thin walled round cystic lesion between the midbrain and upper cerebellar hemispheres, just below the pineal gland region, cannot exclude that this cystic lesion may be a cause of obstructive hydrocephalus. Brain MRI without and with IV contrast is recommended for further characterization. 2. Nonspecific moderate bilateral periventricular white matter hypodensity, cannot exclude transependymal flow of CSF due to hydrocephalus. Electronically Signed   By: Ilona Sorrel M.D.   On: 09/03/2016 17:06   Dg Chest Port 1 View  Result Date: 09/03/2016 CLINICAL DATA:  Abnormal gait EXAM: PORTABLE CHEST 1 VIEW COMPARISON:  None. FINDINGS: The heart size and mediastinal contours are within normal limits. Both lungs are clear. The visualized skeletal structures are unremarkable. IMPRESSION: No active disease. Electronically Signed   By: Dorise Bullion III M.D   On: 09/03/2016 16:28    EKG: Independently reviewed. Normal sinus rhythm.  Assessment/Plan Principal Problem:   Hydrocephalus Active Problems:   HTN (hypertension)   Abnormal gait   Acute encephalopathy    1. Features concerning for obstructive hydrocephalus with a cystic mass in the brain - neurology has been consulted. Plan is to have MRI brain with and without contrast and probably will need neurosurgical consult based on MRI results. 2. Hypertension on Diovan and amlodipine. 3. Hyperlipidemia on statins. 4. History of alcohol abuse - as per patient's sister patient has cut down on his alcohol last 3 months. Neurologist also recommended to keep away from sedation at this time. So I have placed patient on thiamine and will closely monitor for any signs of  withdrawal.   DVT prophylaxis: SCDs. Code Status: Full code.  Family Communication: Patient's sister.  Disposition Plan: To be determined.  Consults called: Neurology.  Admission status: Inpatient.    Rise Patience MD [facility] Pager [phone].  If 7PM-7AM, please contact night-coverage [email or web address] Password [email or web address]  09/03/2016, 11:02 PM

## 2016-09-03 NOTE — Consult Note (Signed)
NEURO HOSPITALIST CONSULT NOTE   Requestig physician: Dr. Hal Hope  Reason for Consult: Hydrocephalus  History obtained from:  Relative and Chart     HPI:                                                                                                                                          Luis Henderson is an 60 y.o. male who presents with worsening gait imbalance and falls. Prior to about 6 months ago his walking was normal. Wife first noticed changes to his gait 6 months ago, when he began to "wobble" after standing up, started to manifest a shuffling quality to his walking and also would have difficulty initiating walking; during the latter episodes the difficulty with starting to walk was followed by a rushing or rapid shuffling gait. The next symptom was loss of bowel and bladder control, beginning about 4 months ago. Symptoms abruptly worsened about 2 weeks ago, the shuffling quality of his gait becoming severe, with very small steps; his sister demonstrates what it looked like, exhibiting a magnetic gait with very small steps. He has also had occasional tremor as well as "freezing" episodes. Together with the abruptly worsened gait, his thinking declined, with new onset of forgetfulness and poor insight noted by his sister. The very first cognitive symptoms occurred 4 weeks ago, with agitation and irritability. He has had several falls. 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   Notes from today and prior hospital visit on the 16th were reviewed. History of symptoms from select notes are summarized in quotes in reverse chronological order as follows.   11/19: "Patient brought in by EMS. Family stating that his walking and weakness is gotten very severe. Patient found on the floor by the son. Apparently patient has not walked normal since 6  months ago. But things got significantly worse in the past few weeks. Patient's had day bowels for falls 2. Followed by the [facility] here at [facility] did have a MRI of his back without any significant findings to explain the leg weakness. Patient has not had an MRI of his brain or CT of his head. Family is also noting that has been altered mental status changes. Possibly could be related to dementia. Patient has become combative at times. Patient is known to have low back pain. That's been evaluated in the past and I thought there was a connection with that. Seen at the wellness clinic this month. Seen in the emergency department on November 16 for fall with rib pain with negative rib series. Seen on November 14 for fall but he eloped. Seen in the urgent care on August 2 for  back pain. No history of any fevers no history of any nausea vomiting or diarrhea."  11/16: "Per EMS- Patient reported that he slid out of bed sometime in the night and laid there until found by his mother this AM. Patient reports that he has had bilateral leg weakness x 3 months that has been getting progressively worse. Patient c/o right rib cage pain."  66/5:  "60 year old male presents with rib pain. Patient was in the hospital with scheduled MRI for chronic back pain. Patient had a fall on the 14th( 2 days ago) when he suffered an injury to his ribs. Patient reports he had other injuries at that time which haven't improved. He notes that he had plain films that showed no acute abnormality. Patient reports he continues to have pain in the right lateral ribs with addition of very minor cough. Patient denies any fever or chills, shortness of breath or chest pain. He denies any other concerning signs or symptoms at this time."   Past Medical History:  Diagnosis Date  . Anxiety   . Depression   . Diabetes mellitus   . Hyperlipidemia   . Hypertension     Past Surgical History:  Procedure Laterality Date  . CYSTOSCOPY WITH  RETROGRADE PYELOGRAM, URETEROSCOPY AND STENT PLACEMENT Right 01/15/2015   Procedure: CYSTOSCOPY, RIGHT URETEROSCOPY/ RETROGRADE PYELOGRAM,HOLMIUM LASER/ LITHOTRIPSY, STENT PLACEMENT, URETERAL BALLOON DIALATION;  Surgeon: Kathie Rhodes, MD;  Location: [facility];  Service: Urology;  Laterality: Right;  . SKIN GRAFT     left hand  . WISDOM TOOTH EXTRACTION      Family History  Problem Relation Age of Onset  . Hyperlipidemia Father    Social History:  reports that he has been smoking Cigarettes.  He has a 14.00 pack-year smoking history. He has never used smokeless tobacco. He reports that he drinks about 3.0 - 3.6 oz of alcohol per week . He reports that he does not use drugs.  Allergies  Allergen Reactions  . Codeine Itching  . Lisinopril Cough    MEDICATIONS:                                                                                                                      Current Facility-Administered Medications:  .  [START ON 09/04/2016] 0.9 %  sodium chloride infusion, , Intravenous, Continuous, Fredia Sorrow, MD, Last Rate: 100 mL/hr at 09/03/16 1659   Home Medications: amLODipine (NORVASC) 10 MG tablet Take 1 tablet (10 mg total) by mouth daily. Arnoldo Morale, MD Needs Review  atorvastatin (LIPITOR) 20 MG tablet Take 1 tablet (20 mg total) by mouth daily. Argentina Donovan, PA-C Needs Review  busPIRone (BUSPAR) 7.5 MG tablet Take 1 tablet (7.5 mg total) by mouth 2 (two) times daily. Arnoldo Morale, MD Needs Review  cyclobenzaprine (FLEXERIL) 10 MG tablet Take 1 tablet (10 mg total) by mouth at bedtime. Arnoldo Morale, MD Needs Review  gabapentin (NEURONTIN) 300 MG capsule Take 1 capsule (300 mg total) by mouth 2 (two) times  daily. Arnoldo Morale, MD Needs Review  meloxicam (MOBIC) 7.5 MG tablet Take 1 tablet (7.5 mg total) by mouth daily. Arnoldo Morale, MD Needs Review  traMADol (ULTRAM) 50 MG tablet Take 1 tablet (50 mg total) by mouth every 8 (eight) hours as needed. Arnoldo Morale, MD Needs  Review   Patient taking differently: Take 50 mg by mouth every 8 (eight) hours as needed for moderate pain.     valsartan (DIOVAN) 160 MG tablet Take 1 tablet (160 mg total) by mouth daily. Arnoldo Morale, MD Needs Review  aspirin EC 81 MG tablet Take 81 mg by mouth daily. Historical Provider, MD Needs Review     ROS:                                                                                                                                       History obtained from patient and his sister. Does not endorse fever or chills. States that the IV in his left arm is uncomfortable.    Blood pressure 172/89, pulse 74, temperature 97.5 F (36.4 C), temperature source Oral, resp. rate 18, height 5\' 11"  (1.803 m), weight 108.9 kg (240 lb), SpO2 94 %.   General Examination:                                                                                                      HEENT-  Normocephalic, atraumatic. Lungs- Respirations unlabored. No gross wheezing.  Extremities: Warm and well perfused. Right knee abrasion noted.   Neurological Examination Mental Status: Awake and alert. Oriented to city, state and year, but not day. Able to follow all motor commands. Speech is fluent without dysarthria. Naming intact. Speech is somewhat pressured. Decreased thought content. Has difficulty remembering the chronological history of his symptoms, almost all of which is related by his sister.   Cranial Nerves: II: Visual fields grossly normal, pupils equal, round, and eactive to light  III,IV, VI: ptosis not present, extra-ocular motions intact bilaterally V,VII: smile symmetric, facial temperature sensation normal bilaterally VIII: hearing intact to questions and commands IX,X: No hypophonia.  XI: bilateral shoulder shrug is symmetric XII: midline tongue extension Motor: Right : Upper extremity   5/5    Left:     Upper extremity   5/5  Lower extremity   5/5     Lower extremity   5/5 Normal tone throughout;  no cogwheeling appreciated. No atrophy noted Sensory: Temperature and light touch intact x 4.  Deep Tendon Reflexes: 1-2+  and symmetric throughout. Right patella not tested due to abrasion. Toes are downgoing bilaterally.  Cerebellar: No ataxia noted during testing of upper extremities.  Gait: Deferred due to falls risk concerns.     Lab Results: Basic Metabolic Panel:  Recent Labs Lab 09/03/16 1611 09/03/16 1620  NA 139 138  K 4.1 4.1  CL 105 103  CO2 25  --   GLUCOSE 103* 110*  BUN 17 20  CREATININE 1.29* 1.20  CALCIUM 9.7  --     Liver Function Tests:  Recent Labs Lab 09/03/16 1611  AST 33  ALT 37  ALKPHOS 80  BILITOT 1.3*  PROT 6.7  ALBUMIN 4.0    Recent Labs Lab 09/03/16 1611  LIPASE 26   No results for input(s): AMMONIA in the last 168 hours.  CBC:  Recent Labs Lab 09/03/16 1611 09/03/16 1620  WBC 10.0  --   NEUTROABS 6.6  --   HGB 17.4* 17.3*  HCT 51.2 51.0  MCV 90.3  --   PLT 264  --     Cardiac Enzymes: No results for input(s): CKTOTAL, CKMB, CKMBINDEX, TROPONINI in the last 168 hours.  Lipid Panel: No results for input(s): CHOL, TRIG, HDL, CHOLHDL, VLDL, LDLCALC in the last 168 hours.  CBG: No results for input(s): GLUCAP in the last 168 hours.  Microbiology: No results found for this or any previous visit.  Coagulation Studies:  Recent Labs  09/03/16 1611  LABPROT 13.8  INR 1.06    Imaging: Ct Head Wo Contrast  Result Date: 09/03/2016 CLINICAL DATA:  Abnormal gait.  Confusion. EXAM: CT HEAD WITHOUT CONTRAST TECHNIQUE: Contiguous axial images were obtained from the base of the skull through the vertex without intravenous contrast. COMPARISON:  None. FINDINGS: Brain: There is symmetric mild dilatation of the lateral ventricles bilaterally. The third ventricle is mildly dilated. The fourth ventricle is normal size. There is a suggestion of a thin-walled round cystic lesion between the midbrain and upper cerebellar hemispheres,  just below the pineal gland region (series 201/ image 12). No evidence of parenchymal hemorrhage or extra-axial fluid collection. No midline shift. No CT evidence of acute infarction. Intracranial atherosclerosis. There is moderate bilateral periventricular white matter hypodensity. Cerebral volume is age appropriate. Vascular: No hyperdense vessel or unexpected calcification. Skull: No evidence of calvarial fracture. Sinuses/Orbits: The visualized paranasal sinuses are essentially clear. Other:  The mastoid air cells are unopacified. IMPRESSION: 1. Symmetric mild dilatation of the lateral ventricles. Mildly dilated third ventricle. Suggestion of a thin walled round cystic lesion between the midbrain and upper cerebellar hemispheres, just below the pineal gland region, cannot exclude that this cystic lesion may be a cause of obstructive hydrocephalus. Brain MRI without and with IV contrast is recommended for further characterization. 2. Nonspecific moderate bilateral periventricular white matter hypodensity, cannot exclude transependymal flow of CSF due to hydrocephalus. Electronically Signed   By: Ilona Sorrel M.D.   On: 09/03/2016 17:06   Dg Chest Port 1 View  Result Date: 09/03/2016 CLINICAL DATA:  Abnormal gait EXAM: PORTABLE CHEST 1 VIEW COMPARISON:  None. FINDINGS: The heart size and mediastinal contours are within normal limits. Both lungs are clear. The visualized skeletal structures are unremarkable. IMPRESSION: No active disease. Electronically Signed   By: Dorise Bullion III M.D   On: 09/03/2016 16:28   Impression: 1. [facility] of progressively worsening gait instability, incontinence and declining cognition, secondary to obstructive hydrocephalus. 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 The cyst is most  likely the underlying etiology for the hydrocephalus.  2. Agitation.  3. Falls risk.   Recommendations: 1. Neurosurgery consultation. Will need ventriculostomy within the next 24 hours. Will likely also need craniectomy for drainage and/or resection of the cyst.  2. Obtain MRI brain with and without contrast to further inform Neurosurgical decision-making. 3. Use caution if sedating due to his altered mentation in the setting of hydrocephalus. Low dose ativan 0.5 mg IV was given once following this consult due to patient agitation. If agitation recurs, consider a low dose of Seroquel and titrate upwards. Would avoid Haldol.   Electronically signed: Dr. Kerney Elbe 09/03/2016, 7:51 PM

## 2016-09-03 NOTE — ED Notes (Signed)
Patient fidgeting with VS equipment, almost pulled IV out. RN repositioned pt in bed and wrapped up pt's IV arm to help distract him from it. Also reinforced need to obtain VS. Pt verbalized understanding, but quick to forget.

## 2016-09-03 NOTE — ED Notes (Signed)
Pt unable to give urine sample. Will attempt I&O cath

## 2016-09-04 ENCOUNTER — Inpatient Hospital Stay (HOSPITAL_COMMUNITY): Payer: Self-pay

## 2016-09-04 ENCOUNTER — Inpatient Hospital Stay (HOSPITAL_COMMUNITY): Payer: Self-pay | Admitting: Anesthesiology

## 2016-09-04 ENCOUNTER — Encounter (HOSPITAL_COMMUNITY): Payer: Self-pay | Admitting: Anesthesiology

## 2016-09-04 ENCOUNTER — Encounter (HOSPITAL_COMMUNITY)
Admission: EM | Disposition: A | Discharge status: Home / Self Care | Payer: Self-pay | Source: Home / Self Care | Attending: Neurosurgery

## 2016-09-04 DIAGNOSIS — G919 Hydrocephalus, unspecified: Secondary | ICD-10-CM

## 2016-09-04 DIAGNOSIS — E785 Hyperlipidemia, unspecified: Secondary | ICD-10-CM | POA: Diagnosis present

## 2016-09-04 DIAGNOSIS — G911 Obstructive hydrocephalus: Secondary | ICD-10-CM | POA: Diagnosis present

## 2016-09-04 HISTORY — PX: VENTRICULOPERITONEAL SHUNT: SHX204

## 2016-09-04 LAB — CBC WITH DIFFERENTIAL/PLATELET
BASOS PCT: 1 %
Basophils Absolute: 0.1 10*3/uL (ref 0.0–0.1)
EOS PCT: 2 %
Eosinophils Absolute: 0.2 10*3/uL (ref 0.0–0.7)
HEMATOCRIT: 47.3 % (ref 39.0–52.0)
Hemoglobin: 15.8 g/dL (ref 13.0–17.0)
Lymphocytes Relative: 25 %
Lymphs Abs: 2 10*3/uL (ref 0.7–4.0)
MCH: 30 pg (ref 26.0–34.0)
MCHC: 33.4 g/dL (ref 30.0–36.0)
MCV: 89.8 fL (ref 78.0–100.0)
MONO ABS: 0.9 10*3/uL (ref 0.1–1.0)
MONOS PCT: 12 %
Neutro Abs: 4.6 10*3/uL (ref 1.7–7.7)
Neutrophils Relative %: 60 %
PLATELETS: 226 10*3/uL (ref 150–400)
RBC: 5.27 MIL/uL (ref 4.22–5.81)
RDW: 13.2 % (ref 11.5–15.5)
WBC: 7.8 10*3/uL (ref 4.0–10.5)

## 2016-09-04 LAB — GLUCOSE, CAPILLARY
GLUCOSE-CAPILLARY: 112 mg/dL — AB (ref 65–99)
GLUCOSE-CAPILLARY: 89 mg/dL (ref 65–99)
GLUCOSE-CAPILLARY: 91 mg/dL (ref 65–99)

## 2016-09-04 LAB — SURGICAL PCR SCREEN
MRSA, PCR: NEGATIVE
Staphylococcus aureus: NEGATIVE

## 2016-09-04 LAB — COMPREHENSIVE METABOLIC PANEL
ALBUMIN: 3.5 g/dL (ref 3.5–5.0)
ALK PHOS: 71 U/L (ref 38–126)
ALT: 31 U/L (ref 17–63)
ANION GAP: 8 (ref 5–15)
AST: 26 U/L (ref 15–41)
BILIRUBIN TOTAL: 1 mg/dL (ref 0.3–1.2)
BUN: 15 mg/dL (ref 6–20)
CALCIUM: 8.9 mg/dL (ref 8.9–10.3)
CO2: 24 mmol/L (ref 22–32)
Chloride: 106 mmol/L (ref 101–111)
Creatinine, Ser: 1.13 mg/dL (ref 0.61–1.24)
GFR calc Af Amer: >60 mL/min (ref >60)
GFR calc non Af Amer: >60 mL/min (ref >60)
GLUCOSE: 122 mg/dL — AB (ref 65–99)
Potassium: 3.6 mmol/L (ref 3.5–5.1)
SODIUM: 138 mmol/L (ref 135–145)
Total Protein: 5.7 g/dL — ABNORMAL LOW (ref 6.5–8.1)

## 2016-09-04 LAB — PROTIME-INR
INR: 1.03
Prothrombin Time: 13.5 seconds (ref 11.4–15.2)

## 2016-09-04 SURGERY — SHUNT INSERTION VENTRICULAR-PERITONEAL
Anesthesia: General | Laterality: Right

## 2016-09-04 MED ORDER — ROCURONIUM BROMIDE 10 MG/ML (PF) SYRINGE
PREFILLED_SYRINGE | INTRAVENOUS | Status: AC
  Filled 2016-09-04: qty 10

## 2016-09-04 MED ORDER — HYDROMORPHONE HCL 1 MG/ML IJ SOLN
0.2500 mg | INTRAMUSCULAR | Status: DC | PRN
  Administered 2016-09-04: 0.5 mg via INTRAVENOUS

## 2016-09-04 MED ORDER — PANTOPRAZOLE SODIUM 40 MG IV SOLR
40.0000 mg | Freq: Every day | INTRAVENOUS | Status: DC
  Administered 2016-09-05 (×2): 40 mg via INTRAVENOUS
  Filled 2016-09-04 (×2): qty 40

## 2016-09-04 MED ORDER — CEFAZOLIN IN D5W 1 GM/50ML IV SOLN
INTRAVENOUS | Status: DC | PRN
  Administered 2016-09-04: 2 g via INTRAVENOUS

## 2016-09-04 MED ORDER — HYDROMORPHONE HCL 1 MG/ML IJ SOLN
INTRAMUSCULAR | Status: AC
  Filled 2016-09-04: qty 0.5

## 2016-09-04 MED ORDER — DOUBLE ANTIBIOTIC 500-10000 UNIT/GM EX OINT
TOPICAL_OINTMENT | CUTANEOUS | Status: AC
  Filled 2016-09-04: qty 1

## 2016-09-04 MED ORDER — ROCURONIUM BROMIDE 100 MG/10ML IV SOLN
INTRAVENOUS | Status: DC | PRN
  Administered 2016-09-04: 80 mg via INTRAVENOUS

## 2016-09-04 MED ORDER — ONDANSETRON HCL 4 MG/2ML IJ SOLN
4.0000 mg | INTRAMUSCULAR | Status: DC | PRN
  Administered 2016-09-04: 4 mg via INTRAVENOUS
  Filled 2016-09-04: qty 2

## 2016-09-04 MED ORDER — LIDOCAINE-EPINEPHRINE (PF) 2 %-1:200000 IJ SOLN
INTRAMUSCULAR | Status: DC | PRN
  Administered 2016-09-04: 14 mL

## 2016-09-04 MED ORDER — HEMOSTATIC AGENTS (NO CHARGE) OPTIME
TOPICAL | Status: DC | PRN
  Administered 2016-09-04: 1 via TOPICAL

## 2016-09-04 MED ORDER — LIDOCAINE 2% (20 MG/ML) 5 ML SYRINGE
INTRAMUSCULAR | Status: AC
  Filled 2016-09-04: qty 5

## 2016-09-04 MED ORDER — LIDOCAINE-EPINEPHRINE (PF) 2 %-1:200000 IJ SOLN
INTRAMUSCULAR | Status: AC
  Filled 2016-09-04: qty 20

## 2016-09-04 MED ORDER — BACITRACIN ZINC 500 UNIT/GM EX OINT
TOPICAL_OINTMENT | CUTANEOUS | Status: DC | PRN
  Administered 2016-09-04: 1 via TOPICAL

## 2016-09-04 MED ORDER — PROPOFOL 10 MG/ML IV BOLUS
INTRAVENOUS | Status: AC
  Filled 2016-09-04: qty 20

## 2016-09-04 MED ORDER — LABETALOL HCL 5 MG/ML IV SOLN
10.0000 mg | INTRAVENOUS | Status: DC | PRN
  Administered 2016-09-04: 10 mg via INTRAVENOUS
  Administered 2016-09-05: 20 mg via INTRAVENOUS
  Filled 2016-09-04 (×2): qty 4

## 2016-09-04 MED ORDER — ACETAMINOPHEN 650 MG RE SUPP: 650.0000 mg | RECTAL | Status: DC | PRN

## 2016-09-04 MED ORDER — GADOBENATE DIMEGLUMINE 529 MG/ML IV SOLN
20.0000 mL | Freq: Once | INTRAVENOUS | Status: AC | PRN
  Administered 2016-09-04: 20 mL via INTRAVENOUS

## 2016-09-04 MED ORDER — FENTANYL CITRATE (PF) 100 MCG/2ML IJ SOLN
INTRAMUSCULAR | Status: DC | PRN
  Administered 2016-09-04: 100 ug via INTRAVENOUS
  Administered 2016-09-04 (×2): 50 ug via INTRAVENOUS

## 2016-09-04 MED ORDER — PHENYLEPHRINE HCL 10 MG/ML IJ SOLN
INTRAMUSCULAR | Status: DC | PRN
  Administered 2016-09-04: 5 ug/min via INTRAVENOUS

## 2016-09-04 MED ORDER — THROMBIN 5000 UNITS EX SOLR
CUTANEOUS | Status: AC
  Filled 2016-09-04: qty 10000

## 2016-09-04 MED ORDER — LACTATED RINGERS IV SOLN
INTRAVENOUS | Status: DC | PRN
  Administered 2016-09-04: 17:00:00 via INTRAVENOUS

## 2016-09-04 MED ORDER — HYDROCODONE-ACETAMINOPHEN 5-325 MG PO TABS
1.0000 | ORAL_TABLET | ORAL | Status: DC | PRN
  Administered 2016-09-05 – 2016-09-08 (×6): 2 via ORAL
  Filled 2016-09-04 (×6): qty 2

## 2016-09-04 MED ORDER — FENTANYL CITRATE (PF) 100 MCG/2ML IJ SOLN
INTRAMUSCULAR | Status: AC
  Filled 2016-09-04: qty 2

## 2016-09-04 MED ORDER — LIDOCAINE HCL (CARDIAC) 20 MG/ML IV SOLN
INTRAVENOUS | Status: DC | PRN
  Administered 2016-09-04: 60 mg via INTRAVENOUS

## 2016-09-04 MED ORDER — LABETALOL HCL 5 MG/ML IV SOLN
INTRAVENOUS | Status: AC
  Filled 2016-09-04: qty 4

## 2016-09-04 MED ORDER — CEFAZOLIN SODIUM-DEXTROSE 2-4 GM/100ML-% IV SOLN
2.0000 g | Freq: Three times a day (TID) | INTRAVENOUS | Status: AC
  Administered 2016-09-05 (×2): 2 g via INTRAVENOUS
  Filled 2016-09-04 (×2): qty 100

## 2016-09-04 MED ORDER — ONDANSETRON HCL 4 MG PO TABS: 4.0000 mg | ORAL_TABLET | ORAL | Status: DC | PRN

## 2016-09-04 MED ORDER — THROMBIN 5000 UNITS EX SOLR
CUTANEOUS | Status: DC | PRN
  Administered 2016-09-04 (×2): 5000 [IU] via TOPICAL

## 2016-09-04 MED ORDER — POTASSIUM CHLORIDE 2 MEQ/ML IV SOLN
INTRAVENOUS | Status: DC
  Administered 2016-09-05: via INTRAVENOUS
  Filled 2016-09-04 (×6): qty 1000

## 2016-09-04 MED ORDER — PROPOFOL 10 MG/ML IV BOLUS
INTRAVENOUS | Status: DC | PRN
  Administered 2016-09-04: 60 mg via INTRAVENOUS
  Administered 2016-09-04: 140 mg via INTRAVENOUS

## 2016-09-04 MED ORDER — LACTATED RINGERS IV SOLN
INTRAVENOUS | Status: DC
  Administered 2016-09-04: 16:00:00 via INTRAVENOUS

## 2016-09-04 MED ORDER — SUGAMMADEX SODIUM 200 MG/2ML IV SOLN
INTRAVENOUS | Status: DC | PRN
  Administered 2016-09-04: 200 mg via INTRAVENOUS

## 2016-09-04 MED ORDER — ARTIFICIAL TEARS OP OINT
TOPICAL_OINTMENT | OPHTHALMIC | Status: AC
  Filled 2016-09-04: qty 3.5

## 2016-09-04 MED ORDER — PROMETHAZINE HCL 25 MG PO TABS: 12.5000 mg | ORAL_TABLET | ORAL | Status: DC | PRN

## 2016-09-04 MED ORDER — MORPHINE SULFATE (PF) 2 MG/ML IV SOLN
1.0000 mg | INTRAVENOUS | Status: DC | PRN
  Administered 2016-09-05: 2 mg via INTRAVENOUS
  Filled 2016-09-04: qty 1

## 2016-09-04 MED ORDER — ACETAMINOPHEN 325 MG PO TABS
650.0000 mg | ORAL_TABLET | ORAL | Status: DC | PRN
  Administered 2016-09-05 – 2016-09-08 (×3): 650 mg via ORAL
  Filled 2016-09-04 (×3): qty 2

## 2016-09-04 MED ORDER — HYDROMORPHONE HCL 1 MG/ML IJ SOLN: 0.2500 mg | INTRAMUSCULAR | Status: DC | PRN

## 2016-09-04 MED ORDER — PROMETHAZINE HCL 25 MG/ML IJ SOLN: 6.2500 mg | INTRAMUSCULAR | Status: DC | PRN

## 2016-09-04 MED ORDER — 0.9 % SODIUM CHLORIDE (POUR BTL) OPTIME
TOPICAL | Status: DC | PRN
  Administered 2016-09-04: 1000 mL

## 2016-09-04 SURGICAL SUPPLY — 59 items
APL SKNCLS STERI-STRIP NONHPOA (GAUZE/BANDAGES/DRESSINGS) ×2
BAG DECANTER FOR FLEXI CONT (MISCELLANEOUS) ×3 IMPLANT
BENZOIN TINCTURE PRP APPL 2/3 (GAUZE/BANDAGES/DRESSINGS) ×6 IMPLANT
BLADE CLIPPER SURG (BLADE) ×6 IMPLANT
BOOT SUTURE AID YELLOW STND (SUTURE) ×3 IMPLANT
BUR ACORN 6.0 PRECISION (BURR) ×2 IMPLANT
BUR ACORN 6.0MM PRECISION (BURR) ×1
CANISTER SUCT 3000ML PPV (MISCELLANEOUS) ×3 IMPLANT
CARTRIDGE OIL MAESTRO DRILL (MISCELLANEOUS) ×1 IMPLANT
CLIP RANEY DISP (INSTRUMENTS) IMPLANT
CLOSURE WOUND 1/2 X4 (GAUZE/BANDAGES/DRESSINGS) ×2
DIFFUSER DRILL AIR PNEUMATIC (MISCELLANEOUS) ×3 IMPLANT
DRAPE INCISE IOBAN 85X60 (DRAPES) ×3 IMPLANT
DRAPE ORTHO SPLIT 77X108 STRL (DRAPES) ×6
DRAPE POUCH INSTRU U-SHP 10X18 (DRAPES) ×3 IMPLANT
DRAPE SURG 17X23 STRL (DRAPES) ×18 IMPLANT
DRAPE SURG ORHT 6 SPLT 77X108 (DRAPES) ×2 IMPLANT
ELECT REM PT RETURN 9FT ADLT (ELECTROSURGICAL) ×3
ELECTRODE REM PT RTRN 9FT ADLT (ELECTROSURGICAL) ×1 IMPLANT
GAUZE SPONGE 4X4 16PLY XRAY LF (GAUZE/BANDAGES/DRESSINGS) IMPLANT
GLOVE BIO SURGEON STRL SZ8 (GLOVE) ×3 IMPLANT
GLOVE BIO SURGEON STRL SZ8.5 (GLOVE) ×3 IMPLANT
GLOVE EXAM NITRILE LRG STRL (GLOVE) IMPLANT
GLOVE EXAM NITRILE XL STR (GLOVE) IMPLANT
GLOVE EXAM NITRILE XS STR PU (GLOVE) IMPLANT
GOWN STRL REUS W/ TWL LRG LVL3 (GOWN DISPOSABLE) IMPLANT
GOWN STRL REUS W/ TWL XL LVL3 (GOWN DISPOSABLE) IMPLANT
GOWN STRL REUS W/TWL LRG LVL3 (GOWN DISPOSABLE)
GOWN STRL REUS W/TWL XL LVL3 (GOWN DISPOSABLE)
KIT BASIN OR (CUSTOM PROCEDURE TRAY) ×3 IMPLANT
KIT ROOM TURNOVER OR (KITS) ×3 IMPLANT
NEEDLE HYPO 22GX1.5 SAFETY (NEEDLE) ×3 IMPLANT
NS IRRIG 1000ML POUR BTL (IV SOLUTION) ×3 IMPLANT
OIL CARTRIDGE MAESTRO DRILL (MISCELLANEOUS) ×3
PACK LAMINECTOMY NEURO (CUSTOM PROCEDURE TRAY) ×3 IMPLANT
PAD ARMBOARD 7.5X6 YLW CONV (MISCELLANEOUS) ×9 IMPLANT
SHEATH PERITONEAL INTRO 46 (MISCELLANEOUS) IMPLANT
SHEATH PERITONEAL INTRO 61 (MISCELLANEOUS) IMPLANT
SPONGE GAUZE 4X4 12PLY STER LF (GAUZE/BANDAGES/DRESSINGS) ×4 IMPLANT
SPONGE LAP 4X18 X RAY DECT (DISPOSABLE) IMPLANT
SPONGE SURGIFOAM ABS GEL SZ50 (HEMOSTASIS) ×3 IMPLANT
STAPLER SKIN PROX WIDE 3.9 (STAPLE) ×3 IMPLANT
STRIP CLOSURE SKIN 1/2X4 (GAUZE/BANDAGES/DRESSINGS) ×3 IMPLANT
SUT ETHILON 3 0 PS 1 (SUTURE) IMPLANT
SUT NURALON 4 0 TR CR/8 (SUTURE) IMPLANT
SUT SILK 0 TIES 10X30 (SUTURE) IMPLANT
SUT SILK 3 0 SH 30 (SUTURE) ×3 IMPLANT
SUT VIC AB 1 CT1 18XBRD ANBCTR (SUTURE) IMPLANT
SUT VIC AB 1 CT1 8-18 (SUTURE)
SUT VIC AB 2-0 CP2 18 (SUTURE) ×3 IMPLANT
SUT VIC AB 3-0 SH 8-18 (SUTURE) ×3 IMPLANT
SYR CONTROL 10ML LL (SYRINGE) ×3 IMPLANT
TAPE CLOTH SURG 4X10 WHT LF (GAUZE/BANDAGES/DRESSINGS) ×4 IMPLANT
TOWEL OR 17X24 6PK STRL BLUE (TOWEL DISPOSABLE) ×3 IMPLANT
TOWEL OR 17X26 10 PK STRL BLUE (TOWEL DISPOSABLE) ×3 IMPLANT
TRAY FOLEY W/METER SILVER 16FR (SET/KITS/TRAYS/PACK) IMPLANT
UNDERPAD 30X30 (UNDERPADS AND DIAPERS) ×3 IMPLANT
VALVE RT ANGLE UNITIZE DIST (Valve) ×2 IMPLANT
WATER STERILE IRR 1000ML POUR (IV SOLUTION) ×3 IMPLANT

## 2016-09-04 NOTE — Progress Notes (Signed)
Patient ID: Luis Henderson, male   DOB: 1956/01/13, 60 y.o.   MRN: [id number] Subjective:  The patient is alert and pleasant. He is in no apparent distress.  Objective: Vital signs in last 24 hours: Temp:  [97.5 F (36.4 C)-98.4 F (36.9 C)] 97.5 F (36.4 C) (11/20 1849) Pulse Rate:  [68-74] 68 (11/20 1451) Resp:  [17-25] 17 (11/20 1451) BP: (142-172)/(79-91) 157/90 (11/20 1451) SpO2:  [94 %-100 %] 100 % (11/20 1451) Weight:  [104.2 kg (229 lb 11.5 oz)] 104.2 kg (229 lb 11.5 oz) (11/20 0620)  Intake/Output from previous day: 11/19 0701 - 11/20 0700 In: [id number] [P.O.:420; I.V.:1000; IV Piggyback:250] Out: 300 [Urine:300] Intake/Output this shift: Total I/O In: 500 [I.V.:500] Out: 505 [Urine:500; Blood:5]  Physical exam the patient is alert and pleasant. He is moving all 4 extremities well. His dressings are clean and dry.  Lab Results:  Recent Labs  09/03/16 1611 09/03/16 1620 09/04/16 0449  WBC 10.0  --  7.8  HGB 17.4* 17.3* 15.8  HCT 51.2 51.0 47.3  PLT 264  --  226   BMET  Recent Labs  09/03/16 1611 09/03/16 1620 09/04/16 0449  NA 139 138 138  K 4.1 4.1 3.6  CL 105 103 106  CO2 25  --  24  GLUCOSE 103* 110* 122*  BUN 17 20 15   CREATININE 1.29* 1.20 1.13  CALCIUM 9.7  --  8.9    Studies/Results: Ct Head Wo Contrast  Result Date: 09/03/2016 CLINICAL DATA:  Abnormal gait.  Confusion. EXAM: CT HEAD WITHOUT CONTRAST TECHNIQUE: Contiguous axial images were obtained from the base of the skull through the vertex without intravenous contrast. COMPARISON:  None. FINDINGS: Brain: There is symmetric mild dilatation of the lateral ventricles bilaterally. The third ventricle is mildly dilated. The fourth ventricle is normal size. There is a suggestion of a thin-walled round cystic lesion between the midbrain and upper cerebellar hemispheres, just below the pineal gland region (series 201/ image 12). No evidence of parenchymal hemorrhage or extra-axial fluid collection.  No midline shift. No CT evidence of acute infarction. Intracranial atherosclerosis. There is moderate bilateral periventricular white matter hypodensity. Cerebral volume is age appropriate. Vascular: No hyperdense vessel or unexpected calcification. Skull: No evidence of calvarial fracture. Sinuses/Orbits: The visualized paranasal sinuses are essentially clear. Other:  The mastoid air cells are unopacified. IMPRESSION: 1. Symmetric mild dilatation of the lateral ventricles. Mildly dilated third ventricle. Suggestion of a thin walled round cystic lesion between the midbrain and upper cerebellar hemispheres, just below the pineal gland region, cannot exclude that this cystic lesion may be a cause of obstructive hydrocephalus. Brain MRI without and with IV contrast is recommended for further characterization. 2. Nonspecific moderate bilateral periventricular white matter hypodensity, cannot exclude transependymal flow of CSF due to hydrocephalus. Electronically Signed   By: Ilona Sorrel M.D.   On: 09/03/2016 17:06   Mr Jeri Cos [id number] Contrast  Result Date: 09/04/2016 CLINICAL DATA:  Gait imbalance and falls. EXAM: MRI HEAD WITHOUT AND WITH CONTRAST TECHNIQUE: Multiplanar, multiecho pulse sequences of the brain and surrounding structures were obtained without and with intravenous contrast. CONTRAST:  74mL MULTIHANCE GADOBENATE DIMEGLUMINE 529 MG/ML IV SOLN COMPARISON:  Head CT 09/03/2016 FINDINGS: Brain: There is a cystic focus centered at the level of the cerebral aqueduct, measuring 2.0 x 2.0 x 2.1 cm. This results in severe obstructive hydrocephalus at the level of the cerebral aqueduct with associated dilatation of the lateral and third ventricles. There is juxta ventricular hyperintense T2  weighted signal suggesting transependymal flow of CSF. The lesion shows no contrast enhancement. Its internal signal characteristics follow CSF on all sequences. There is no diffusion restriction. Vascular: Major intracranial  arterial and venous sinus flow voids are preserved. No evidence of chronic microhemorrhage or amyloid angiopathy. Skull and upper cervical spine: The visualized skull base, calvarium, upper cervical spine and extracranial soft tissues are normal. Sinuses/Orbits: No fluid levels or advanced mucosal thickening. No mastoid effusion. Normal orbits. 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 There is no solid or contrast-enhancing component. Electronically Signed   By: Ulyses Jarred M.D.   On: 09/04/2016 02:50   Dg Pelvis Portable  Result Date: 09/04/2016 CLINICAL DATA:  Fall. EXAM: PORTABLE PELVIS 1-2 VIEWS COMPARISON:  CT 01/13/2015 . FINDINGS: Degenerative changes lumbar spine and both hips. No acute bony or joint abnormality. No evidence of fracture dislocation. IMPRESSION: Mild degenerative changes lumbar spine and both hips. No acute abnormality. Electronically Signed   By: Marcello Moores  Register   On: 09/04/2016 09:40   Dg Chest Port 1 View  Result Date: 09/03/2016 CLINICAL DATA:  Abnormal gait EXAM: PORTABLE CHEST 1 VIEW COMPARISON:  None. FINDINGS: The heart size and mediastinal contours are within normal limits. Both lungs are clear. The visualized skeletal structures are unremarkable. IMPRESSION: No active disease. Electronically Signed   By: Dorise Bullion III M.D   On: 09/03/2016 16:28    Assessment/Plan: The patient is doing well.  LOS: 1 day     Gale Klar D 09/04/2016, 6:55 PM

## 2016-09-04 NOTE — Progress Notes (Signed)
Dr. Arnoldo Morale notified of 3 episodes of diarrhea. Will further evaluate once he gets back to [facility]. RN from [employer] notified.

## 2016-09-04 NOTE — Progress Notes (Addendum)
PROGRESS NOTE    Luis Henderson  [id number] DOB: 1956-09-22 DOA: 09/03/2016 PCP: Arnoldo Morale, MD     Brief Narrative:  Luis Henderson is a 60 y.o. male with history of hypertension, hyperlipidemia and previous history of alcohol abuse was brought to the ER after the patient was found to be having increasing confusion over the last 1 month. Patient also was found to have gait difficulties with broad-based gait over the last 6 months and incontinence of urine. In the ER CT head shows features concerning for obstructive hydrocephalus. Neurology and neurosurgery consulted.    Assessment & Plan:   Principal Problem:   Obstructive hydrocephalus Active Problems:   HTN (hypertension)   Abnormal gait   Acute encephalopathy   HLD (hyperlipidemia)  Obstructive hydrocephalus -Neurology following -Neurosurgery planning for right VP shunt today  -Neuro checks   HTN -Continue norvasc, avapro   HLD -Continue lipitor  Anxiety -Continue buspar -Avoid sedating medications, haldol.    DVT prophylaxis: SCDs Code Status: Full Family Communication: no family at bedside Disposition Plan: pending further improvement    Consultants:   Neurology  Neurosurgery  Procedures:   None  Antimicrobials:   None    Subjective: Patient tells me that he started having symptoms over the last 6-9 months of broad-based gait as well as urinary incontinence. These symptoms started getting worse with confusion, which is what brought him to the hospital. He has no complaints of headache, visual disturbances, chest pain or shortness of breath. He denies any weakness.  Objective: Vitals:   09/03/16 1900 09/03/16 1945 09/03/16 2114 09/04/16 0620  BP: 172/89 147/91 (!) 170/84 (!) 143/79  Pulse:  74 73 68  Resp: 18 25 18 18   Temp:   98.1 F (36.7 C) 98.4 F (36.9 C)  TempSrc:    Oral  SpO2: 94% 99% 100% 98%  Weight:    104.2 kg (229 lb 11.5 oz)  Height:    5\' 8"  (1.727 m)     Intake/Output Summary (Last 24 hours) at 09/04/16 0921 Last data filed at 09/04/16 0612  Gross per 24 hour  Intake             1670 ml  Output              300 ml  Net             1370 ml   Filed Weights   09/03/16 1536 09/04/16 0620  Weight: 108.9 kg (240 lb) 104.2 kg (229 lb 11.5 oz)    Examination:  General exam: Appears calm and comfortable  Respiratory system: Clear to auscultation. Respiratory effort normal. Cardiovascular system: S1 & S2 heard, RRR. No JVD, murmurs, rubs, gallops or clicks. No pedal edema. Gastrointestinal system: Abdomen is nondistended, soft and nontender. No organomegaly or masses felt. Normal bowel sounds heard. Central nervous system: Alert and oriented. No focal neurological deficits. Extremities: Symmetric 5 x 5 power. Skin: No rashes, lesions or ulcers Psychiatry: Judgement and insight appear normal. Mood & affect appear flat.   Data Reviewed: I have personally reviewed following labs and imaging studies  CBC:  Recent Labs Lab 09/03/16 1611 09/03/16 1620 09/04/16 0449  WBC 10.0  --  7.8  NEUTROABS 6.6  --  4.6  HGB 17.4* 17.3* 15.8  HCT 51.2 51.0 47.3  MCV 90.3  --  89.8  PLT 264  --  [id number]   Basic Metabolic Panel:  Recent Labs Lab 09/03/16 1611 09/03/16 1620 09/04/16 0449  NA  139 138 138  K 4.1 4.1 3.6  CL 105 103 106  CO2 25  --  24  GLUCOSE 103* 110* 122*  BUN 17 20 15   CREATININE 1.29* 1.20 1.13  CALCIUM 9.7  --  8.9   GFR: Estimated Creatinine Clearance: 80.3 mL/min (by C-G formula based on SCr of 1.13 mg/dL). Liver Function Tests:  Recent Labs Lab 09/03/16 1611 09/04/16 0449  AST 33 26  ALT 37 31  ALKPHOS 80 71  BILITOT 1.3* 1.0  PROT 6.7 5.7*  ALBUMIN 4.0 3.5    Recent Labs Lab 09/03/16 1611  LIPASE 26   No results for input(s): AMMONIA in the last 168 hours. Coagulation Profile:  Recent Labs Lab 09/03/16 1611  INR 1.06   Cardiac Enzymes: No results for input(s): CKTOTAL, CKMB, CKMBINDEX,  TROPONINI in the last 168 hours. BNP (last 3 results) No results for input(s): PROBNP in the last 8760 hours. HbA1C: No results for input(s): HGBA1C in the last 72 hours. CBG:  Recent Labs Lab 09/03/16 2330  GLUCAP 97   Lipid Profile: No results for input(s): CHOL, HDL, LDLCALC, TRIG, CHOLHDL, LDLDIRECT in the last 72 hours. Thyroid Function Tests: No results for input(s): TSH, T4TOTAL, FREET4, T3FREE, THYROIDAB in the last 72 hours. Anemia Panel: No results for input(s): VITAMINB12, FOLATE, FERRITIN, TIBC, IRON, RETICCTPCT in the last 72 hours. Sepsis Labs: No results for input(s): PROCALCITON, LATICACIDVEN in the last 168 hours.  No results found for this or any previous visit (from the past 240 hour(s)).     Radiology Studies: Ct Head Wo Contrast  Result Date: 09/03/2016 CLINICAL DATA:  Abnormal gait.  Confusion. EXAM: CT HEAD WITHOUT CONTRAST TECHNIQUE: Contiguous axial images were obtained from the base of the skull through the vertex without intravenous contrast. COMPARISON:  None. FINDINGS: Brain: There is symmetric mild dilatation of the lateral ventricles bilaterally. The third ventricle is mildly dilated. The fourth ventricle is normal size. There is a suggestion of a thin-walled round cystic lesion between the midbrain and upper cerebellar hemispheres, just below the pineal gland region (series 201/ image 12). No evidence of parenchymal hemorrhage or extra-axial fluid collection. No midline shift. No CT evidence of acute infarction. Intracranial atherosclerosis. There is moderate bilateral periventricular white matter hypodensity. Cerebral volume is age appropriate. Vascular: No hyperdense vessel or unexpected calcification. Skull: No evidence of calvarial fracture. Sinuses/Orbits: The visualized paranasal sinuses are essentially clear. Other:  The mastoid air cells are unopacified. IMPRESSION: 1. Symmetric mild dilatation of the lateral ventricles. Mildly dilated third  ventricle. Suggestion of a thin walled round cystic lesion between the midbrain and upper cerebellar hemispheres, just below the pineal gland region, cannot exclude that this cystic lesion may be a cause of obstructive hydrocephalus. Brain MRI without and with IV contrast is recommended for further characterization. 2. Nonspecific moderate bilateral periventricular white matter hypodensity, cannot exclude transependymal flow of CSF due to hydrocephalus. Electronically Signed   By: Ilona Sorrel M.D.   On: 09/03/2016 17:06   Mr Jeri Cos [id number] Contrast  Result Date: 09/04/2016 CLINICAL DATA:  Gait imbalance and falls. EXAM: MRI HEAD WITHOUT AND WITH CONTRAST TECHNIQUE: Multiplanar, multiecho pulse sequences of the brain and surrounding structures were obtained without and with intravenous contrast. CONTRAST:  9mL MULTIHANCE GADOBENATE DIMEGLUMINE 529 MG/ML IV SOLN COMPARISON:  Head CT 09/03/2016 FINDINGS: Brain: There is a cystic focus centered at the level of the cerebral aqueduct, measuring 2.0 x 2.0 x 2.1 cm. This results in severe obstructive hydrocephalus at  the level of the cerebral aqueduct with associated dilatation of the lateral and third ventricles. There is juxta ventricular hyperintense T2 weighted signal suggesting transependymal flow of CSF. The lesion shows no contrast enhancement. Its internal signal characteristics follow CSF on all sequences. There is no diffusion restriction. Vascular: Major intracranial arterial and venous sinus flow voids are preserved. No evidence of chronic microhemorrhage or amyloid angiopathy. Skull and upper cervical spine: The visualized skull base, calvarium, upper cervical spine and extracranial soft tissues are normal. Sinuses/Orbits: No fluid levels or advanced mucosal thickening. No mastoid effusion. Normal orbits. IMPRESSION: Cystic lesion at the level of the cerebral aqueduct causing severe obstructive hydrocephalus of the lateral and third ventricles with likely  transependymal CSF flow. 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 There is no solid or contrast-enhancing component. Electronically Signed   By: Ulyses Jarred M.D.   On: 09/04/2016 02:50   Dg Chest Port 1 View  Result Date: 09/03/2016 CLINICAL DATA:  Abnormal gait EXAM: PORTABLE CHEST 1 VIEW COMPARISON:  None. FINDINGS: The heart size and mediastinal contours are within normal limits. Both lungs are clear. The visualized skeletal structures are unremarkable. IMPRESSION: No active disease. Electronically Signed   By: Dorise Bullion III M.D   On: 09/03/2016 16:28      Scheduled Meds: . amLODipine  10 mg Oral Daily  . atorvastatin  20 mg Oral q1800  . busPIRone  7.5 mg Oral BID  . gabapentin  300 mg Oral BID  . irbesartan  150 mg Oral Daily  . thiamine injection  100 mg Intravenous Daily   Continuous Infusions: . sodium chloride 100 mL/hr at 09/03/16 2317     LOS: 1 day    Time spent: 40 minutes   Dessa Phi, DO [facility] [email or web address] Password [email or web address] 09/04/2016, 9:21 AM

## 2016-09-04 NOTE — Progress Notes (Signed)
eLink Physician-Brief Progress Note Patient Name: Luis Henderson DOB: Oct 24, 1955 MRN: [id number]   Date of Service  09/04/2016  HPI/Events of Note  Obstructive hydrocephalus, s/p VP shunt today Stable on camera check  eICU Interventions  No eICU intervention     Intervention Category Evaluation Type: New Patient Evaluation  Simonne Maffucci 09/04/2016, 11:52 PM

## 2016-09-04 NOTE — Op Note (Signed)
Brief history: The patient is a 60 year old white male who is admitted with mental status changes, confusion, behavioral changes, and difficulty with ambulation. 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 I discussed the situation with the patient and his family. We discussed the various treatment options. I recommend placement of a right ventriculoperitoneal shunt. The patient has weighed the risks, benefits, and alternatives to surgery and decided to proceed with the placement of the shunt.  Preop diagnosis: Obstructive hydrocephalus, posterior fossa arachnoid cyst  Postop diagnosis: The same  Procedure: Placement of right retroauricular ventriculoperitoneal shunt (Codman programmable valve set at 80 mmHg).  Surgeon: Dr. Earle Gell  Assistant: Dr. Granville Lewis  Anesthesia: Gen. tracheal  Estimated blood loss: 75 mL  Specimens: None  Drains: None  Complications: None  Description of procedure: The patient was brought to the operating room by the anesthesia team. General endotracheal anesthesia was induced. The patient remained in the supine position. A roll was placed under his right shoulder and thorax, his head was turned to the left. The patient's right retroauricular region was then shaved with clippers. The patient's abdomen thorax neck and right scalp was prepared with Betadine scrub and Betadine solution. Sterile drapes were applied. I then injected the area to be incised with lidocaine with epinephrine solution. I used a scalpel to make an incision at patient's right upper quadrant. I used the cerebellar retractor for exposure. I exposed the anterior rectus sheath and divided it with the Metzenbaum scissors. We divided through the rectus muscle and divided the posterior rectus sheath. Identified the peritoneum and divided with the Metzenbaum scissors  entering the peritoneal cavity.  I then made incision in the patient's right retroauricular region. We used cerebellar retractor for exposure. I then used a shunt passer to pass the shunt which had previously been set at 44 Miller to mercury from the scalp incision to the abdominal incision. I then used a high-speed drill to create a right retroauricular burr hole exposing the underlying dura. I coagulated the dura. I then cannulated the ventricular system on the first pass with the ventricular catheter. I removed the stylette and threaded it a bit deeper. I then connected the ventricular catheter to the Rickham reservoir. We secured the connection with a silk tie. We then pulled the slack out of the peritoneal catheter I secured the valve with a figure-of-eight Vicryl suture. The distal end of the perineal catheter was then placed into the peritoneal cavity. The anterior and posterior rectus sheath were then closed with interrupted 2-0 Vicryl suture. The subcutaneous tissue was then reapproximated with 2-0 Vicryl suture and the skin with stainless steel cables. I reapproximated the galea with interrupted 2-0 Vicryl suture and the skin and the scalp with stainless steel staples. The wounds were then coated with bacitracin ointment. A sterile dressing was applied. The drapes were removed. By report all sponge, instrument, and needle counts were correct at the end of this case.

## 2016-09-04 NOTE — Progress Notes (Signed)
Patient ID: Luis Henderson, male   DOB: 06/07/1956, 60 y.o.   MRN: [id number] I spoke with the patient again and now with his son, daughter, and ex-wife. I have again discussed the diagnosis, the various treatment options, etc. I have told him I think the simplest way to deal with the hydrocephalus is placement of a ventricular peritoneal shunt since trying to drain the cyst would be more risky. I explained the procedure to them. We discussed the risks, benefits, alternatives, expected postoperative course, the likelihood of achieving goals of surgery, etc. I have answered all their questions. The patient wants to proceed with surgery.

## 2016-09-04 NOTE — Anesthesia Preprocedure Evaluation (Addendum)
Anesthesia Evaluation  Patient identified by MRN, date of birth, ID band Patient awake    Reviewed: Allergy & Precautions, H&P , NPO status , Patient's Chart, lab work & pertinent test results  Airway Mallampati: II  TM Distance: >3 FB Neck ROM: Full    Dental no notable dental hx. (+) Teeth Intact, Dental Advisory Given   Pulmonary Current Smoker,    Pulmonary exam normal breath sounds clear to auscultation       Cardiovascular hypertension, Pt. on medications  Rhythm:Regular Rate:Normal     Neuro/Psych Hydrocephalus Confusion Gait disturbance    GI/Hepatic negative GI ROS, Neg liver ROS,   Endo/Other  diabetes  Renal/GU negative Renal ROS  negative genitourinary   Musculoskeletal   Abdominal   Peds  Hematology negative hematology ROS (+)   Anesthesia Other Findings   Reproductive/Obstetrics negative OB ROS                          Anesthesia Physical Anesthesia Plan  ASA: III  Anesthesia Plan: General   Post-op Pain Management:    Induction: Intravenous  Airway Management Planned: Oral ETT  Additional Equipment:   Intra-op Plan:   Post-operative Plan: Extubation in OR  Informed Consent: I have reviewed the patients History and Physical, chart, labs and discussed the procedure including the risks, benefits and alternatives for the proposed anesthesia with the patient or authorized representative who has indicated his/her understanding and acceptance.   Dental advisory given  Plan Discussed with: CRNA  Anesthesia Plan Comments:        Anesthesia Quick Evaluation

## 2016-09-04 NOTE — Progress Notes (Signed)
Patient was transported to OR. RN called report to short stay.

## 2016-09-04 NOTE — Anesthesia Procedure Notes (Signed)
Procedure Name: Intubation Date/Time: 09/04/2016 5:14 PM Performed by: Izora Gala Pre-anesthesia Checklist: Patient identified, Emergency Drugs available and Suction available Patient Re-evaluated:Patient Re-evaluated prior to inductionOxygen Delivery Method: Circle system utilized Preoxygenation: Pre-oxygenation with 100% oxygen Intubation Type: IV induction Ventilation: Mask ventilation without difficulty and Oral airway inserted - appropriate to patient size Laryngoscope Size: Miller and 3 Grade View: Grade II Tube type: Oral Number of attempts: 1 Airway Equipment and Method: Stylet,  Bite block and LTA kit utilized Placement Confirmation: ETT inserted through vocal cords under direct vision,  positive ETCO2 and breath sounds checked- equal and bilateral Secured at: 22 cm Tube secured with: Tape

## 2016-09-04 NOTE — Consult Note (Signed)
Reason for Consult: Hydrocephalus Referring Physician: Dr. Lindell Spar is an 60 y.o. male.  HPI: The patient is a 60 year old right-handed white male who was admitted with mental status changes, confusion, and difficulty with walking. By report this has been occurring over the last 6 months or so. 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 A neurosurgical consultation was requested.  Presently the patient is pleasant. He denies headaches. He's had some nausea and vomiting. He has difficulty ambulating.  Past Medical History:  Diagnosis Date  . Anxiety   . Depression   . Diabetes mellitus   . Hyperlipidemia   . Hypertension     Past Surgical History:  Procedure Laterality Date  . CYSTOSCOPY WITH RETROGRADE PYELOGRAM, URETEROSCOPY AND STENT PLACEMENT Right 01/15/2015   Procedure: CYSTOSCOPY, RIGHT URETEROSCOPY/ RETROGRADE PYELOGRAM,HOLMIUM LASER/ LITHOTRIPSY, STENT PLACEMENT, URETERAL BALLOON DIALATION;  Surgeon: Kathie Rhodes, MD;  Location: [facility];  Service: Urology;  Laterality: Right;  . SKIN GRAFT     left hand  . WISDOM TOOTH EXTRACTION      Family History  Problem Relation Age of Onset  . Hyperlipidemia Father     Social History:  reports that he has been smoking Cigarettes.  He has a 14.00 pack-year smoking history. He has never used smokeless tobacco. He reports that he drinks about 3.0 - 3.6 oz of alcohol per week . He reports that he does not use drugs.  Allergies:  Allergies  Allergen Reactions  . Codeine Itching  . Lisinopril Cough    Medications:  I have reviewed the patient's current medications. Prior to Admission:  Prescriptions Prior to Admission  Medication Sig Dispense Refill Last Dose  . amLODipine (NORVASC) 10 MG tablet Take 1 tablet (10 mg total) by mouth daily. 30 tablet 5 unknown  . atorvastatin (LIPITOR) 20 MG tablet Take 1 tablet (20 mg total)  by mouth daily. 90 tablet 2 unknown  . busPIRone (BUSPAR) 7.5 MG tablet Take 1 tablet (7.5 mg total) by mouth 2 (two) times daily. 60 tablet 5 unknown  . cyclobenzaprine (FLEXERIL) 10 MG tablet Take 1 tablet (10 mg total) by mouth at bedtime. 30 tablet 1 unknown  . gabapentin (NEURONTIN) 300 MG capsule Take 1 capsule (300 mg total) by mouth 2 (two) times daily. 60 capsule 5 unknown  . meloxicam (MOBIC) 7.5 MG tablet Take 1 tablet (7.5 mg total) by mouth daily. 30 tablet 3 unknown  . traMADol (ULTRAM) 50 MG tablet Take 1 tablet (50 mg total) by mouth every 8 (eight) hours as needed. (Patient taking differently: Take 50 mg by mouth every 8 (eight) hours as needed for moderate pain. ) 60 tablet 1 unknown  . valsartan (DIOVAN) 160 MG tablet Take 1 tablet (160 mg total) by mouth daily. 30 tablet 5 unknown  . aspirin EC 81 MG tablet Take 81 mg by mouth daily.   unknown   Scheduled: . amLODipine  10 mg Oral Daily  . atorvastatin  20 mg Oral q1800  . busPIRone  7.5 mg Oral BID  . gabapentin  300 mg Oral BID  . irbesartan  150 mg Oral Daily  . thiamine injection  100 mg Intravenous Daily   Continuous: . sodium chloride 100 mL/hr at 09/03/16 2317   ZOX:[id number] **OR** acetaminophen, ondansetron **OR** ondansetron (ZOFRAN) IV Anti-infectives    None       Results for orders placed or performed during  the hospital encounter of 09/03/16 (from the past 48 hour(s))  Comprehensive metabolic panel     Status: Abnormal   Collection Time: 09/03/16  4:11 PM  Result Value Ref Range   Sodium 139 135 - 145 mmol/L   Potassium 4.1 3.5 - 5.1 mmol/L   Chloride 105 101 - 111 mmol/L   CO2 25 22 - 32 mmol/L   Glucose, Bld 103 (H) 65 - 99 mg/dL   BUN 17 6 - 20 mg/dL   Creatinine, Ser 1.29 (H) 0.61 - 1.24 mg/dL   Calcium 9.7 8.9 - 10.3 mg/dL   Total Protein 6.7 6.5 - 8.1 g/dL   Albumin 4.0 3.5 - 5.0 g/dL   AST 33 15 - 41 U/L   ALT 37 17 - 63 U/L   Alkaline Phosphatase 80 38 - 126 U/L   Total  Bilirubin 1.3 (H) 0.3 - 1.2 mg/dL   GFR calc non Af Amer 58 (L) >60 mL/min   GFR calc Af Amer >60 >60 mL/min    Comment: (NOTE) The eGFR has been calculated using the CKD EPI equation. This calculation has not been validated in all clinical situations. eGFR's persistently <60 mL/min signify possible Chronic Kidney Disease.    Anion gap 9 5 - 15  Lipase, blood     Status: None   Collection Time: 09/03/16  4:11 PM  Result Value Ref Range   Lipase 26 11 - 51 U/L  Protime-INR     Status: None   Collection Time: 09/03/16  4:11 PM  Result Value Ref Range   Prothrombin Time 13.8 11.4 - 15.2 seconds   INR 1.06   CBC with Differential/Platelet     Status: Abnormal   Collection Time: 09/03/16  4:11 PM  Result Value Ref Range   WBC 10.0 4.0 - 10.5 K/uL   RBC 5.67 4.22 - 5.81 MIL/uL   Hemoglobin 17.4 (H) 13.0 - 17.0 g/dL   HCT 51.2 39.0 - 52.0 %   MCV 90.3 78.0 - 100.0 fL   MCH 30.7 26.0 - 34.0 pg   MCHC 34.0 30.0 - 36.0 g/dL   RDW 12.9 11.5 - 15.5 %   Platelets 264 150 - 400 K/uL   Neutrophils Relative % 66 %   Neutro Abs 6.6 1.7 - 7.7 K/uL   Lymphocytes Relative 22 %   Lymphs Abs 2.2 0.7 - 4.0 K/uL   Monocytes Relative 10 %   Monocytes Absolute 1.0 0.1 - 1.0 K/uL   Eosinophils Relative 1 %   Eosinophils Absolute 0.1 0.0 - 0.7 K/uL   Basophils Relative 1 %   Basophils Absolute 0.1 0.0 - 0.1 K/uL  Ethanol     Status: None   Collection Time: 09/03/16  4:11 PM  Result Value Ref Range   Alcohol, Ethyl (B) <5 <5 mg/dL    Comment:        LOWEST DETECTABLE LIMIT FOR SERUM ALCOHOL IS 5 mg/dL FOR MEDICAL PURPOSES ONLY   I-stat Chem 8, ED     Status: Abnormal   Collection Time: 09/03/16  4:20 PM  Result Value Ref Range   Sodium 138 135 - 145 mmol/L   Potassium 4.1 3.5 - 5.1 mmol/L   Chloride 103 101 - 111 mmol/L   BUN 20 6 - 20 mg/dL   Creatinine, Ser 1.20 0.61 - 1.24 mg/dL   Glucose, Bld 110 (H) 65 - 99 mg/dL   Calcium, Ion 1.15 1.15 - 1.40 mmol/L   TCO2 26 0 - 100 mmol/L  Hemoglobin 17.3 (H) 13.0 - 17.0 g/dL   HCT 51.0 39.0 - 52.0 %  Urinalysis, Routine w reflex microscopic (not at [facility])     Status: Abnormal   Collection Time: 09/03/16  5:58 PM  Result Value Ref Range   Color, Urine AMBER (A) YELLOW    Comment: BIOCHEMICALS MAY BE AFFECTED BY COLOR   APPearance CLOUDY (A) CLEAR   Specific Gravity, Urine 1.031 (H) 1.005 - 1.030   pH 5.0 5.0 - 8.0   Glucose, UA NEGATIVE NEGATIVE mg/dL   Hgb urine dipstick SMALL (A) NEGATIVE   Bilirubin Urine SMALL (A) NEGATIVE   Ketones, ur NEGATIVE NEGATIVE mg/dL   Protein, ur NEGATIVE NEGATIVE mg/dL   Nitrite NEGATIVE NEGATIVE   Leukocytes, UA NEGATIVE NEGATIVE  Rapid urine drug screen (hospital performed)     Status: Abnormal   Collection Time: 09/03/16  5:58 PM  Result Value Ref Range   Opiates NONE DETECTED NONE DETECTED   Cocaine NONE DETECTED NONE DETECTED   Benzodiazepines NONE DETECTED NONE DETECTED   Amphetamines NONE DETECTED NONE DETECTED   Tetrahydrocannabinol POSITIVE (A) NONE DETECTED   Barbiturates NONE DETECTED NONE DETECTED    Comment:        DRUG SCREEN FOR MEDICAL PURPOSES ONLY.  IF CONFIRMATION IS NEEDED FOR ANY PURPOSE, NOTIFY LAB WITHIN 5 DAYS.        LOWEST DETECTABLE LIMITS FOR URINE DRUG SCREEN Drug Class       Cutoff (ng/mL) Amphetamine      1000 Barbiturate      200 Benzodiazepine   440 Tricyclics       102 Opiates          300 Cocaine          300 THC              50   Urine microscopic-add on     Status: None   Collection Time: 09/03/16  5:58 PM  Result Value Ref Range   Squamous Epithelial / LPF NONE SEEN NONE SEEN   WBC, UA NONE SEEN 0 - 5 WBC/hpf   RBC / HPF 0-5 0 - 5 RBC/hpf   Bacteria, UA NONE SEEN NONE SEEN   Urine-Other MUCOUS PRESENT   Glucose, capillary     Status: None   Collection Time: 09/03/16 11:30 PM  Result Value Ref Range   Glucose-Capillary 97 65 - 99 mg/dL  Comprehensive metabolic panel     Status: Abnormal   Collection Time: 09/04/16  4:49 AM   Result Value Ref Range   Sodium 138 135 - 145 mmol/L   Potassium 3.6 3.5 - 5.1 mmol/L   Chloride 106 101 - 111 mmol/L   CO2 24 22 - 32 mmol/L   Glucose, Bld 122 (H) 65 - 99 mg/dL   BUN 15 6 - 20 mg/dL   Creatinine, Ser 1.13 0.61 - 1.24 mg/dL   Calcium 8.9 8.9 - 10.3 mg/dL   Total Protein 5.7 (L) 6.5 - 8.1 g/dL   Albumin 3.5 3.5 - 5.0 g/dL   AST 26 15 - 41 U/L   ALT 31 17 - 63 U/L   Alkaline Phosphatase 71 38 - 126 U/L   Total Bilirubin 1.0 0.3 - 1.2 mg/dL   GFR calc non Af Amer >60 >60 mL/min   GFR calc Af Amer >60 >60 mL/min    Comment: (NOTE) The eGFR has been calculated using the CKD EPI equation. This calculation has not been validated in all clinical situations. eGFR's  persistently <60 mL/min signify possible Chronic Kidney Disease.    Anion gap 8 5 - 15  CBC WITH DIFFERENTIAL     Status: None   Collection Time: 09/04/16  4:49 AM  Result Value Ref Range   WBC 7.8 4.0 - 10.5 K/uL   RBC 5.27 4.22 - 5.81 MIL/uL   Hemoglobin 15.8 13.0 - 17.0 g/dL   HCT 47.3 39.0 - 52.0 %   MCV 89.8 78.0 - 100.0 fL   MCH 30.0 26.0 - 34.0 pg   MCHC 33.4 30.0 - 36.0 g/dL   RDW 13.2 11.5 - 15.5 %   Platelets 226 150 - 400 K/uL   Neutrophils Relative % 60 %   Neutro Abs 4.6 1.7 - 7.7 K/uL   Lymphocytes Relative 25 %   Lymphs Abs 2.0 0.7 - 4.0 K/uL   Monocytes Relative 12 %   Monocytes Absolute 0.9 0.1 - 1.0 K/uL   Eosinophils Relative 2 %   Eosinophils Absolute 0.2 0.0 - 0.7 K/uL   Basophils Relative 1 %   Basophils Absolute 0.1 0.0 - 0.1 K/uL    Ct Head Wo Contrast  Result Date: 09/03/2016 CLINICAL DATA:  Abnormal gait.  Confusion. EXAM: CT HEAD WITHOUT CONTRAST TECHNIQUE: Contiguous axial images were obtained from the base of the skull through the vertex without intravenous contrast. COMPARISON:  None. FINDINGS: Brain: There is symmetric mild dilatation of the lateral ventricles bilaterally. The third ventricle is mildly dilated. The fourth ventricle is normal size. There is a  suggestion of a thin-walled round cystic lesion between the midbrain and upper cerebellar hemispheres, just below the pineal gland region (series 201/ image 12). No evidence of parenchymal hemorrhage or extra-axial fluid collection. No midline shift. No CT evidence of acute infarction. Intracranial atherosclerosis. There is moderate bilateral periventricular white matter hypodensity. Cerebral volume is age appropriate. Vascular: No hyperdense vessel or unexpected calcification. Skull: No evidence of calvarial fracture. Sinuses/Orbits: The visualized paranasal sinuses are essentially clear. Other:  The mastoid air cells are unopacified. IMPRESSION: 1. Symmetric mild dilatation of the lateral ventricles. Mildly dilated third ventricle. Suggestion of a thin walled round cystic lesion between the midbrain and upper cerebellar hemispheres, just below the pineal gland region, cannot exclude that this cystic lesion may be a cause of obstructive hydrocephalus. Brain MRI without and with IV contrast is recommended for further characterization. 2. Nonspecific moderate bilateral periventricular white matter hypodensity, cannot exclude transependymal flow of CSF due to hydrocephalus. Electronically Signed   By: Ilona Sorrel M.D.   On: 09/03/2016 17:06   Mr Luis Henderson GL Contrast  Result Date: 09/04/2016 CLINICAL DATA:  Gait imbalance and falls. EXAM: MRI HEAD WITHOUT AND WITH CONTRAST TECHNIQUE: Multiplanar, multiecho pulse sequences of the brain and surrounding structures were obtained without and with intravenous contrast. CONTRAST:  64m MULTIHANCE GADOBENATE DIMEGLUMINE 529 MG/ML IV SOLN COMPARISON:  Head CT 09/03/2016 FINDINGS: Brain: There is a cystic focus centered at the level of the cerebral aqueduct, measuring 2.0 x 2.0 x 2.1 cm. This results in severe obstructive hydrocephalus at the level of the cerebral aqueduct with associated dilatation of the lateral and third ventricles. There is juxta ventricular  hyperintense T2 weighted signal suggesting transependymal flow of CSF. The lesion shows no contrast enhancement. Its internal signal characteristics follow CSF on all sequences. There is no diffusion restriction. Vascular: Major intracranial arterial and venous sinus flow voids are preserved. No evidence of chronic microhemorrhage or amyloid angiopathy. Skull and upper cervical spine: The visualized skull base,  calvarium, upper cervical spine and extracranial soft tissues are normal. Sinuses/Orbits: No fluid levels or advanced mucosal thickening. No mastoid effusion. Normal orbits. 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 There is no solid or contrast-enhancing component. Electronically Signed   By: Ulyses Jarred M.D.   On: 09/04/2016 02:50   Dg Chest Port 1 View  Result Date: 09/03/2016 CLINICAL DATA:  Abnormal gait EXAM: PORTABLE CHEST 1 VIEW COMPARISON:  None. FINDINGS: The heart size and mediastinal contours are within normal limits. Both lungs are clear. The visualized skeletal structures are unremarkable. IMPRESSION: No active disease. Electronically Signed   By: Dorise Bullion III M.D   On: 09/03/2016 16:28    ROS: As above Blood pressure (!) 143/79, pulse 68, temperature 98.4 F (36.9 C), temperature source Oral, resp. rate 18, height 5' 8" (1.727 m), weight 104.2 kg (229 lb 11.5 oz), SpO2 98 %. Physical Exam  General: An alert 60 year old white male with somewhat of a flat affect.  HEENT: Normocephalic, atraumatic, his pupils are equal round reactive light, extraocular muscles are intact.  Neck: Supple without masses or deformity  Thorax: Symmetric  Abdomen: Obese and soft  Extremities: Unremarkable  Neurologic exam: The patient is alert and oriented 3, Glasgow  Coma Scale 15. Cranial nerves II through XII were examined bilaterally and grossly normal. Vision and hearing are grossly normal bilaterally. His motor strength is 5 over 5 in his bilateral biceps, triceps, quadriceps, gastrocnemius and left hand grip. He has some slight weakness in his right hand grip. Cerebellar functions intact and rapid alternate movements of the upper extremities bilaterally. Sensory function is intact to light touch sensation all tested dermatomes bilaterally. There is no Parinaud's.   Assessment/Plan: Posterior fossa, quadrigeminal cistern cyst, obstructive hydrocephalus: I have discussed the situation with the patient. We have discussed various treatment options. I have recommended that we place a right ventriculoperitoneal shunt. I have described surgery to him. We have discussed the risks of surgery including risks of anesthesia, hemorrhage, infection, seizures, shunt placement, shunt malfunction, etc. We have discussed alternative surgical option of resection of the cyst but I think this would carry much more risk. I have answered all his questions. He has decided to proceed with surgery. We will plan to do this sometime today. I have asked him to call his family and have them come to his room if they want to discuss this with me as well.  Dariann Huckaba D 09/04/2016, 7:28 AM

## 2016-09-04 NOTE — Care Management Note (Signed)
Case Management Note  Patient Details  Name: Luis Henderson MRN: [id number] Date of Birth: [birth year]  Subjective/Objective:                 Patient from home, lives alone, has had worsening confusing gait in past few weeks. Per neuro note: Worsening gait instability, incontinence and declining cognition, secondary to obstructive hydrocephalus due to a large cyst as is seen on the CT scan, and is most likely the underlying etiology for the hydrocephalus. PCP [place] Dr. Jarold Song   Action/Plan:  Plan is for  VP shunt 11/20. Anticipate PT eval after surgery. CM will continue to follow.  Expected Discharge Date:                  Expected Discharge Plan:     In-House Referral:     Discharge planning Services  CM Consult  Post Acute Care Choice:    Choice offered to:     DME Arranged:    DME Agency:     HH Arranged:    HH Agency:     Status of Service:  In process, will continue to follow  If discussed at Long Length of Stay Meetings, dates discussed:    Additional Comments:  Carles Collet, RN 09/04/2016, 3:47 PM

## 2016-09-04 NOTE — Anesthesia Postprocedure Evaluation (Signed)
Anesthesia Post Note  Patient: Luis Henderson  Procedure(s) Performed: Procedure(s) (LRB): SHUNT INSERTION VENTRICULAR-PERITONEAL (Right)  Patient location during evaluation: PACU Anesthesia Type: General Level of consciousness: sedated Pain management: pain level controlled Vital Signs Assessment: post-procedure vital signs reviewed and stable Respiratory status: spontaneous breathing and respiratory function stable Cardiovascular status: stable Anesthetic complications: no    Last Vitals:  Vitals:   09/04/16 2045 09/04/16 2100  BP: (!) 150/79 (!) 153/85  Pulse: 65 70  Resp: 18 15  Temp:      Last Pain:  Vitals:   09/04/16 1849  TempSrc:   PainSc: 0-No pain                 Daphene Chisholm DANIEL

## 2016-09-04 NOTE — Transfer of Care (Signed)
Immediate Anesthesia Transfer of Care Note  Patient: Luis Henderson  Procedure(s) Performed: Procedure(s): SHUNT INSERTION VENTRICULAR-PERITONEAL (Right)  Patient Location: PACU  Anesthesia Type:General  Level of Consciousness: awake, sedated and patient cooperative  Airway & Oxygen Therapy: Patient Spontanous Breathing and Patient connected to nasal cannula oxygen  Post-op Assessment: Report given to RN, Post -op Vital signs reviewed and stable and Patient moving all extremities  Post vital signs: Reviewed and stable  Last Vitals:  Vitals:   09/04/16 1451 09/04/16 1849  BP: (!) 157/90   Pulse: 68   Resp: 17   Temp: 36.7 C 36.4 C    Last Pain:  Vitals:   09/04/16 1451  TempSrc: Oral  PainSc:          Complications: No apparent anesthesia complications

## 2016-09-05 ENCOUNTER — Encounter (HOSPITAL_COMMUNITY): Payer: Self-pay | Admitting: *Deleted

## 2016-09-05 ENCOUNTER — Inpatient Hospital Stay (HOSPITAL_COMMUNITY): Payer: Self-pay

## 2016-09-05 LAB — BASIC METABOLIC PANEL
ANION GAP: 9 (ref 5–15)
BUN: 10 mg/dL (ref 6–20)
CHLORIDE: 104 mmol/L (ref 101–111)
CO2: 24 mmol/L (ref 22–32)
Calcium: 8.8 mg/dL — ABNORMAL LOW (ref 8.9–10.3)
Creatinine, Ser: 1.09 mg/dL (ref 0.61–1.24)
GFR calc Af Amer: >60 mL/min (ref >60)
GLUCOSE: 110 mg/dL — AB (ref 65–99)
POTASSIUM: 3.6 mmol/L (ref 3.5–5.1)
Sodium: 137 mmol/L (ref 135–145)

## 2016-09-05 LAB — CBC WITH DIFFERENTIAL/PLATELET
BASOS ABS: 0.1 10*3/uL (ref 0.0–0.1)
Basophils Relative: 1 %
Eosinophils Absolute: 0.2 10*3/uL (ref 0.0–0.7)
Eosinophils Relative: 2 %
HEMATOCRIT: 46.7 % (ref 39.0–52.0)
HEMOGLOBIN: 15.5 g/dL (ref 13.0–17.0)
LYMPHS ABS: 2 10*3/uL (ref 0.7–4.0)
LYMPHS PCT: 23 %
MCH: 29.9 pg (ref 26.0–34.0)
MCHC: 33.2 g/dL (ref 30.0–36.0)
MCV: 90.2 fL (ref 78.0–100.0)
Monocytes Absolute: 1.1 10*3/uL — ABNORMAL HIGH (ref 0.1–1.0)
Monocytes Relative: 12 %
NEUTROS ABS: 5.6 10*3/uL (ref 1.7–7.7)
Neutrophils Relative %: 62 %
Platelets: 246 10*3/uL (ref 150–400)
RBC: 5.18 MIL/uL (ref 4.22–5.81)
RDW: 12.9 % (ref 11.5–15.5)
WBC: 8.8 10*3/uL (ref 4.0–10.5)

## 2016-09-05 MED ORDER — PNEUMOCOCCAL VAC POLYVALENT 25 MCG/0.5ML IJ INJ
0.5000 mL | INJECTION | INTRAMUSCULAR | Status: DC
  Filled 2016-09-05: qty 0.5

## 2016-09-05 NOTE — Care Management Note (Signed)
Case Management Note Original Note Created by Carles Collet 09/04/16  Patient Details  Name: Luis Henderson MRN: [id number] Date of Birth: June 01, 1956  Subjective/Objective:                 Patient from home, lives alone, has had worsening confusing gait in past few weeks. Per neuro note: Worsening gait instability, incontinence and declining cognition, secondary to obstructive hydrocephalus due to a large cyst as is seen on the CT scan, and is most likely the underlying etiology for the hydrocephalus. PCP [place] Dr. Jarold Song   Action/Plan:  Plan is for  VP shunt 11/20. Anticipate PT eval after surgery. CM will continue to follow.  Expected Discharge Date:                  Expected Discharge Plan:     In-House Referral:     Discharge planning Services  CM Consult  Post Acute Care Choice:    Choice offered to:     DME Arranged:    DME Agency:     HH Arranged:    HH Agency:     Status of Service:  In process, will continue to follow  If discussed at Long Length of Stay Meetings, dates discussed:    Additional Comments: 09/05/2016 Pt transferred to ICU late last night post Placement of right retroauricular ventriculoperitoneal shunt .  Pt in the process of transferring to tele floor.  CM spoke with bedside nurse; bedside nurse voiced concerns with pt ambulating per her assessment - pt also reported to have had noticeable gait changes PTA.   CM ordered PT/OT eval.   Maryclare Labrador, RN 09/05/2016, 9:24 AM

## 2016-09-05 NOTE — Progress Notes (Signed)
Patient underwent right VP shunt with neurosx yesterday. Spoke with Dr. Arnoldo Morale today and he will assume primary care of patient. [facility] will be available for questions if needed. Please contact [place] flow manager at ID:[id number]. We will sign off.    Dessa Phi, DO [facility] [email or web address] Password [email or web address] 09/05/2016, 9:24 AM

## 2016-09-05 NOTE — Progress Notes (Signed)
Patient transferred from unit 38M to room 5C12 at this time. Alert and in stable condition.

## 2016-09-05 NOTE — Progress Notes (Signed)
Patient ID: Luis Henderson, male   DOB: January 03, 1956, 60 y.o.   MRN: [id number] Subjective:  The patient is alert and pleasant. He looks well. He wants to eat.  Objective: Vital signs in last 24 hours: Temp:  [97.5 F (36.4 C)-99.5 F (37.5 C)] 98.1 F (36.7 C) (11/21 0345) Pulse Rate:  [64-89] 65 (11/21 0700) Resp:  [13-25] 23 (11/21 0700) BP: (129-178)/(73-132) 146/77 (11/21 0700) SpO2:  [95 %-100 %] 99 % (11/21 0700) Weight:  [104.4 kg (230 lb 2.6 oz)] 104.4 kg (230 lb 2.6 oz) (11/20 2330)  Intake/Output from previous day: 11/20 0701 - 11/21 0700 In: 1757.5 [P.O.:500; I.V.:1137.5; IV Piggyback:100] Out: 2320 [Urine:2315; Blood:5] Intake/Output this shift: No intake/output data recorded.  Physical exam the patient is alert and pleasant. He looks well. His dressings are clean and dry. He is oriented 3. Speech is normal. His strength is normal.  Lab Results:  Recent Labs  09/04/16 0449 09/05/16 0140  WBC 7.8 8.8  HGB 15.8 15.5  HCT 47.3 46.7  PLT 226 246   BMET  Recent Labs  09/04/16 0449 09/05/16 0140  NA 138 137  K 3.6 3.6  CL 106 104  CO2 24 24  GLUCOSE 122* 110*  BUN 15 10  CREATININE 1.13 1.09  CALCIUM 8.9 8.8*    Studies/Results: Ct Head Wo Contrast  Result Date: 09/05/2016 CLINICAL DATA:  Obstructive hydrocephalus. Status post shunt placement. EXAM: CT HEAD WITHOUT CONTRAST TECHNIQUE: Contiguous axial images were obtained from the base of the skull through the vertex without intravenous contrast. COMPARISON:  Brain MRI 09/04/2016 and head CT 09/03/2016 FINDINGS: Brain: The patient has undergone right parietal approach shunt catheter placement with the tip in the posterior left lateral ventricle. The ventricles have decreased in size, most notably seen in the temporal horns and third ventricle. Fluid density lesion at the cerebral aqueduct is unchanged. There is persistent periventricular hypoattenuation which is compatible with transependymal edema.  There is no acute hemorrhage or extra-axial collection. There is trace pneumocephalus within the temporal horn of the right lateral ventricle. Vascular: No hyperdense vessel or unexpected calcification. Skull: Right parietal burr hole Sinuses/Orbits: The visualized portions of the paranasal sinuses and mastoid air cells are free of fluid. No advanced mucosal thickening. The visualized orbits are normal. Other: None IMPRESSION: 1. Right parietal approach shunt catheter placement with decreased size of the lateral and third ventricles. 2. No intracranial hemorrhage or other new, acute abnormality. 3. 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 Electronically Signed   By: Ulyses Jarred M.D.   On: 09/05/2016 06:23   Ct Head Wo Contrast  Result Date: 09/03/2016 CLINICAL DATA:  Abnormal gait.  Confusion. EXAM: CT HEAD WITHOUT CONTRAST TECHNIQUE: Contiguous axial images were obtained from the base of the skull through the vertex without intravenous contrast. COMPARISON:  None. FINDINGS: Brain: There is symmetric mild dilatation of the lateral ventricles bilaterally. The third ventricle is mildly dilated. The fourth ventricle is normal size. There is a suggestion of a thin-walled round cystic lesion between the midbrain and upper cerebellar hemispheres, just below the pineal gland region (series 201/ image 12). No evidence of parenchymal hemorrhage or extra-axial fluid collection. No midline shift. No CT evidence of acute infarction. Intracranial atherosclerosis. There is moderate bilateral periventricular white matter hypodensity. Cerebral volume is age appropriate. Vascular: No hyperdense vessel or unexpected calcification. Skull: No evidence of calvarial fracture. Sinuses/Orbits: The visualized paranasal sinuses are essentially clear. Other:  The mastoid air cells are  unopacified. IMPRESSION: 1. Symmetric mild dilatation of the lateral ventricles. Mildly dilated third ventricle. Suggestion  of a thin walled round cystic lesion between the midbrain and upper cerebellar hemispheres, just below the pineal gland region, cannot exclude that this cystic lesion may be a cause of obstructive hydrocephalus. Brain MRI without and with IV contrast is recommended for further characterization. 2. Nonspecific moderate bilateral periventricular white matter hypodensity, cannot exclude transependymal flow of CSF due to hydrocephalus. Electronically Signed   By: Ilona Sorrel M.D.   On: 09/03/2016 17:06   Mr Jeri Cos [id number] Contrast  Result Date: 09/04/2016 CLINICAL DATA:  Gait imbalance and falls. EXAM: MRI HEAD WITHOUT AND WITH CONTRAST TECHNIQUE: Multiplanar, multiecho pulse sequences of the brain and surrounding structures were obtained without and with intravenous contrast. CONTRAST:  52mL MULTIHANCE GADOBENATE DIMEGLUMINE 529 MG/ML IV SOLN COMPARISON:  Head CT 09/03/2016 FINDINGS: Brain: There is a cystic focus centered at the level of the cerebral aqueduct, measuring 2.0 x 2.0 x 2.1 cm. This results in severe obstructive hydrocephalus at the level of the cerebral aqueduct with associated dilatation of the lateral and third ventricles. There is juxta ventricular hyperintense T2 weighted signal suggesting transependymal flow of CSF. The lesion shows no contrast enhancement. Its internal signal characteristics follow CSF on all sequences. There is no diffusion restriction. Vascular: Major intracranial arterial and venous sinus flow voids are preserved. No evidence of chronic microhemorrhage or amyloid angiopathy. Skull and upper cervical spine: The visualized skull base, calvarium, upper cervical spine and extracranial soft tissues are normal. Sinuses/Orbits: No fluid levels or advanced mucosal thickening. No mastoid effusion. Normal orbits. 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 There is no solid or contrast-enhancing component. Electronically Signed   By: Ulyses Jarred M.D.   On: 09/04/2016 02:50   Dg Pelvis Portable  Result Date: 09/04/2016 CLINICAL DATA:  Fall. EXAM: PORTABLE PELVIS 1-2 VIEWS COMPARISON:  CT 01/13/2015 . FINDINGS: Degenerative changes lumbar spine and both hips. No acute bony or joint abnormality. No evidence of fracture dislocation. IMPRESSION: Mild degenerative changes lumbar spine and both hips. No acute abnormality. Electronically Signed   By: Marcello Moores  Register   On: 09/04/2016 09:40   Dg Chest Port 1 View  Result Date: 09/03/2016 CLINICAL DATA:  Abnormal gait EXAM: PORTABLE CHEST 1 VIEW COMPARISON:  None. FINDINGS: The heart size and mediastinal contours are within normal limits. Both lungs are clear. The visualized skeletal structures are unremarkable. IMPRESSION: No active disease. Electronically Signed   By: Dorise Bullion III M.D   On: 09/03/2016 16:28    Assessment/Plan: Postop day #1: The patient's postop scan looks good. He looks good clinically. I'll transfer him to the floor. He will likely go home tomorrow.  LOS: 2 days     Jamy Whyte D 09/05/2016, 7:40 AM

## 2016-09-05 NOTE — Progress Notes (Signed)
Occupational Therapy Evaluation Patient Details Name: Luis Henderson MRN: [id number] DOB: [birth year] Today's Date: 09/05/2016    History of Present Illness 60 y.o. male with history of hypertension, hyperlipidemia and previous history of alcohol abuse was brought to the ER after the patient was found to be having increasing confusion over the last 1 month. Patient also was found to have gait difficulties with broad-based gait over the last 6 months and incontinence of urine.CT head shows features concerning for obstructive hydrocephalus. Placement of right retroauricular ventriculoperitoneal shunt 11/20   Clinical Impression   PTA, pt lived at home with mother and had multiple recent falls, multiple ED visits, apparently eloped on 11/14 ED visit, had increased confusion and gait abnormality PTA. During assessment, pt impulsive with decreased awareness of deficits and safety. Pt unable to recall his recent PMH. Pt unsteady with gait at times, therefore, RW used. Pt picking up RW and pushing door open with RW then pulling RW into the air to demonstrate how he could walk without it while leaning to the left. Pt with incontinent episode at beginning of session and did not demonstrate the insight/need to change his wet gown. Pt with difficulty with self monitoring behavior.Pt giving nurse different information about home set up than this therapist.  At this time, pt not safe to DC home alone. Recommend CIR to facilitate safe return home and maximize functional level of independence. Will follow acutely to facilitate safe DC home.     Follow Up Recommendations  CIR;Supervision/Assistance - 24 hour    Equipment Recommendations  Tub/shower seat    Recommendations for Other Services Rehab consult  Speech cognitive evaluation     Precautions / Restrictions Precautions Precautions: Fall      Mobility Bed Mobility Overal bed mobility: Needs Assistance Bed Mobility: Supine to Sit      Supine to sit: Supervision;HOB elevated        Transfers Overall transfer level: Needs assistance Equipment used: Rolling walker (2 wheeled) Transfers: Sit to/from [facility] Sit to Stand: Min assist Stand pivot transfers: Min assist       General transfer comment: initially unsteady    Balance Overall balance assessment: Needs assistance   Sitting balance-Leahy Scale: Good       Standing balance-Leahy Scale: Fair Standing balance comment: L bias                            ADL Overall ADL's : Needs assistance/impaired Eating/Feeding: Independent   Grooming: Set up   Upper Body Bathing: Set up;Supervision/ safety;Sitting   Lower Body Bathing: Minimal assistance;Sit to/from stand   Upper Body Dressing : Set up;Sitting   Lower Body Dressing: Minimal assistance;Sit to/from stand   Toilet Transfer: Minimal assistance;Ambulation   Toileting- Clothing Manipulation and Hygiene: Maximal assistance Toileting - Clothing Manipulation Details (indicate cue type and reason): incontinent of urine     Functional mobility during ADLs: Minimal assistance;Cueing for safety;Cueing for sequencing;Rolling walker (pt with L lateral lean at times. Pt picking up RW to push op)       Vision Additional Comments: will further assess   Perception     Praxis      Pertinent Vitals/Pain Pain Assessment: 0-10 Pain Score: 7  Pain Location: headache Pain Descriptors / Indicators: Aching Pain Intervention(s): Limited activity within patient's tolerance;Patient requesting pain meds-RN notified     Hand Dominance Right   Extremity/Trunk Assessment Upper Extremity Assessment Upper Extremity Assessment: Overall  WFL for tasks assessed   Lower Extremity Assessment Lower Extremity Assessment: Defer to PT evaluation   Cervical / Trunk Assessment Cervical / Trunk Assessment: other Pt had difficulty maintaining midline postural control. Demonstrated L bias      Communication Communication Communication: No difficulties   Cognition Arousal/Alertness: Awake/alert Behavior During Therapy: WFL for tasks assessed/performed Overall Cognitive Status: No family/caregiver present to determine baseline cognitive functioning  impulsive Poor insight/safety /judgement - pushing door open with RW upon walking for first time Poor ability to self regulate/inappropriate - commenting of nursing tech's "chest" Decreased memory - unable to give accurate PMH Impaired attention - selective - upon trying to stand up, pt reaching to continue to eat dessert Multiple vc to use RW correctly - poor carry over                   General Comments       Exercises       Shoulder Instructions      Home Living Family/patient expects to be discharged to:: Private residence Living Arrangements: Alone Available Help at Discharge: Friend(s);Available 24 hours/day Type of Home: House Home Access: Stairs to enter [employer] of Steps: 2 Entrance Stairs-Rails: None Home Layout: One level     Bathroom Shower/Tub: Occupational psychologist: Standard     Home Equipment: Environmental consultant - 2 wheels   Additional Comments: pt states he could posible stay with a friend if needed      Prior Functioning/Environment Level of Independence: Independent        Comments: Mom lived with him; he was her caretaker; drove        OT Problem List: Decreased activity tolerance;Impaired balance (sitting and/or standing);Impaired vision/perception;Decreased cognition;Decreased safety awareness;Pain   OT Treatment/Interventions: Self-care/ADL training;Therapeutic exercise;DME and/or AE instruction;Therapeutic activities;Cognitive remediation/compensation;Visual/perceptual remediation/compensation;Patient/family education;Balance training    OT Goals(Current goals can be found in the care plan section) Acute Rehab OT Goals Patient Stated Goal: to stop  falling OT Goal Formulation: With patient Time For Goal Achievement: 09/19/16 Potential to Achieve Goals: Good  OT Frequency: Min 2X/week   Barriers to D/C: Other (comment) (unsure of caregiver support)          Co-evaluation              End of Session Equipment Utilized During Treatment: Gait belt;Rolling walker Nurse Communication: Mobility status  Activity Tolerance: Patient tolerated treatment well Patient left: in chair;with call bell/phone within reach;with chair alarm set   Time: 1640-1710 OT Time Calculation (min): 30 min Charges:  OT General Charges $OT Visit: 1 Procedure OT Evaluation $OT Eval Moderate Complexity: 1 Procedure OT Treatments $Self Care/Home Management : 8-22 mins G-Codes:    Tyjae Issa,HILLARY 26-Sep-2016, 5:28 PM   [facility], OTR/L  [phone] Sep 26, 2016

## 2016-09-06 ENCOUNTER — Encounter (HOSPITAL_COMMUNITY): Payer: Self-pay | Admitting: Physical Medicine and Rehabilitation

## 2016-09-06 DIAGNOSIS — I10 Essential (primary) hypertension: Secondary | ICD-10-CM

## 2016-09-06 DIAGNOSIS — F1010 Alcohol abuse, uncomplicated: Secondary | ICD-10-CM

## 2016-09-06 DIAGNOSIS — G911 Obstructive hydrocephalus: Principal | ICD-10-CM

## 2016-09-06 DIAGNOSIS — R269 Unspecified abnormalities of gait and mobility: Secondary | ICD-10-CM

## 2016-09-06 DIAGNOSIS — W19XXXA Unspecified fall, initial encounter: Secondary | ICD-10-CM

## 2016-09-06 DIAGNOSIS — M545 Low back pain: Secondary | ICD-10-CM

## 2016-09-06 DIAGNOSIS — Z720 Tobacco use: Secondary | ICD-10-CM

## 2016-09-06 DIAGNOSIS — F411 Generalized anxiety disorder: Secondary | ICD-10-CM

## 2016-09-06 DIAGNOSIS — W19XXXS Unspecified fall, sequela: Secondary | ICD-10-CM

## 2016-09-06 DIAGNOSIS — G8929 Other chronic pain: Secondary | ICD-10-CM

## 2016-09-06 DIAGNOSIS — F329 Major depressive disorder, single episode, unspecified: Secondary | ICD-10-CM

## 2016-09-06 DIAGNOSIS — F419 Anxiety disorder, unspecified: Secondary | ICD-10-CM

## 2016-09-06 MED ORDER — DOCUSATE SODIUM 100 MG PO CAPS
100.0000 mg | ORAL_CAPSULE | Freq: Two times a day (BID) | ORAL | Status: DC | PRN
  Administered 2016-09-06 – 2016-09-07 (×3): 100 mg via ORAL
  Filled 2016-09-06 (×3): qty 1

## 2016-09-06 MED ORDER — PANTOPRAZOLE SODIUM 40 MG PO TBEC
40.0000 mg | DELAYED_RELEASE_TABLET | Freq: Every day | ORAL | Status: DC
  Administered 2016-09-06 – 2016-09-07 (×2): 40 mg via ORAL
  Filled 2016-09-06 (×2): qty 1

## 2016-09-06 MED ORDER — VITAMIN B-1 100 MG PO TABS
100.0000 mg | ORAL_TABLET | Freq: Every day | ORAL | Status: DC
  Administered 2016-09-07 – 2016-09-08 (×2): 100 mg via ORAL
  Filled 2016-09-06 (×2): qty 1

## 2016-09-06 NOTE — Progress Notes (Signed)
Discussed with Dr. Posey Pronto and he recommends SNF rehab at this time, not inpt rehab admission. I have alerted RN CM, Vida Roller and SW, Addison. We will sign off. [phone]

## 2016-09-06 NOTE — Evaluation (Signed)
Physical Therapy Evaluation Patient Details Name: Luis Henderson MRN: [id number] DOB: January 09, 1956 Today's Date: 09/06/2016   History of Present Illness  60 y.o. male with history of hypertension, hyperlipidemia and previous history of alcohol abuse was brought to the ER after the patient was found to be having increasing confusion over the last 1 month. Patient also was found to have gait difficulties with broad-based gait over the last 6 months and incontinence of urine.CT head shows features concerning for obstructive hydrocephalus. Placement of right retroauricular ventriculoperitoneal shunt 11/20    Clinical Impression  Pt admitted with above diagnosis. Patient with decreased awareness of his imbalance (repeatedly turning his head while walking despite the fact it causes him to drift and/or stagger to his left). Scored 20/56 on Berg Balance Assessment, indicating high fall risk (and pt had multiple falls PTA). Patient lives alone. Pt currently with functional limitations due to the deficits listed below (see PT Problem List).  Pt will benefit from skilled PT to increase their independence and safety with mobility to allow discharge to the venue listed below.       Follow Up Recommendations CIR    Equipment Recommendations  None recommended by PT    Recommendations for Other Services Rehab consult;Speech consult (Speech for cognition)     Precautions / Restrictions Precautions Precautions: Fall Precaution Comments: multiple falls PTA Restrictions Weight Bearing Restrictions: No      Mobility  Bed Mobility Overal bed mobility: Needs Assistance Bed Mobility: Supine to Sit     Supine to sit: Supervision     General bed mobility comments: with heavy use of bedrail (when asked to try not to use it, he continued to use it)  Transfers Overall transfer level: Needs assistance Equipment used: None Transfers: Sit to/from Stand Sit to Stand: Min assist         General  transfer comment: on initial stance reports dizziness with slight imbalance towards his left (reports he had dizziness with changes in position PTA; improved post-op)  Ambulation/Gait Ambulation/Gait assistance: Min assist Ambulation Distance (Feet): 180 Feet Assistive device: None (gait belt) Gait Pattern/deviations: Step-through pattern;Decreased stride length;Decreased weight shift to right;Drifts right/left;Wide base of support;Staggering left Gait velocity: decr however able to vary up/down on command. Left lean worsens with decr velocity Gait velocity interpretation: Below normal speed for age/gender General Gait Details: Patient significantly leaning to his left during gait (including drift to his left). He could not fully correct to midline with cues and only briefly maintained this correction. Patient staggers to left with min assist to recover when turning his head while walking (pt doing this spontaneously, looking into rooms, with decr awareness of how it impacts his balance and causes drift/stagger).  Stairs            Wheelchair Mobility    Modified Rankin (Stroke Patients Only)       Balance Overall balance assessment: Needs assistance         Standing balance support: No upper extremity supported Standing balance-Leahy Scale: Good Standing balance comment: shifted left             High level balance activites: Backward walking;Direction changes;Turns;Sudden stops;Head turns High Level Balance Comments: decr balance with turns, head turns, sudden stop (which was delayed--taking 2 steps to stop) Standardized Balance Assessment Standardized Balance Assessment : Berg Balance Test Berg Balance Test Sit to Stand: Needs minimal aid to stand or to stabilize Standing Unsupported: Able to stand 30 seconds unsupported Sitting with Back Unsupported but  Feet Supported on Floor or Stool: Able to sit safely and securely 2 minutes Stand to Sit: Controls descent by  using hands Transfers: Needs one person to assist Standing Unsupported with Eyes Closed: Able to stand 3 seconds (can perform 10 seconds with minguard assist) Standing Ubsupported with Feet Together: Needs help to attain position but able to stand for 30 seconds with feet together ( ) From Standing, Reach Forward with Outstretched Arm: Reaches forward but needs supervision From Standing Position, Pick up Object from Floor: Unable to try/needs assist to keep balance From Standing Position, Turn to Look Behind Over each Shoulder: Needs supervision when turning Turn 360 Degrees: Needs close supervision or verbal cueing Standing Unsupported, Alternately Place Feet on Step/Stool: Able to complete >2 steps/needs minimal assist Standing Unsupported, One Foot in Front: Needs help to step but can hold 15 seconds Standing on One Leg: Tries to lift leg/unable to hold 3 seconds but remains standing independently Total Score: 20         Pertinent Vitals/Pain Pain Assessment: 0-10 Pain Score: 7  Pain Location: headache Pain Descriptors / Indicators: Aching Pain Intervention(s): Limited activity within patient's tolerance;Monitored during session;Repositioned;Patient requesting pain meds-RN notified    Home Living Family/patient expects to be discharged to:: Private residence Living Arrangements: Alone (mom will be moved to "facility" before he returns home) Available Help at Discharge: Friend(s) Type of Home: House Home Access: Stairs to enter Entrance Stairs-Rails: None Entrance Stairs-Number of Steps: 2 Home Layout: One level Home Equipment: Environmental consultant - 2 wheels Additional Comments: pt states he could posible stay with a friend if needed    Prior Function Level of Independence: Independent         Comments: Mom lived with him; he was her caretaker; drove     Hand Dominance   Dominant Hand: Right    Extremity/Trunk Assessment   Upper Extremity Assessment: Defer to OT evaluation            Lower Extremity Assessment: Overall WFL for tasks assessed      Cervical / Trunk Assessment: Other exceptions  Communication   Communication: No difficulties  Cognition Arousal/Alertness: Awake/alert Behavior During Therapy: WFL for tasks assessed/performed Overall Cognitive Status: No family/caregiver present to determine baseline cognitive functioning Area of Impairment: Memory;Following commands;Awareness     Memory: Decreased short-term memory (Prior to surgery as well)     Awareness: Emergent   General Comments: unable to recall details of some falls "I just fall forwards, but mostly backwards" Unable to problem-solve how to pick an item up off the floor "I don't know what I would do.    General Comments      Exercises     Assessment/Plan    PT Assessment Patient needs continued PT services  PT Problem List Decreased activity tolerance;Decreased balance;Decreased mobility;Decreased cognition;Decreased knowledge of use of DME;Decreased safety awareness;Decreased knowledge of precautions;Pain          PT Treatment Interventions DME instruction;Gait training;Stair training;Functional mobility training;Therapeutic activities;Balance training;Neuromuscular re-education;Cognitive remediation;Patient/family education    PT Goals (Current goals can be found in the Care Plan section)  Acute Rehab PT Goals Patient Stated Goal: to stop falling PT Goal Formulation: With patient Time For Goal Achievement: 09/13/16 Potential to Achieve Goals: Good    Frequency Min 3X/week   Barriers to discharge Decreased caregiver support      Co-evaluation               End of Session Equipment Utilized During Treatment: Gait belt  Activity Tolerance: Patient limited by fatigue;Treatment limited secondary to medical complications (Comment) (increased headache) Patient left: in chair;with call bell/phone within reach;with chair alarm set           Time:  [phone] PT Time Calculation (min) (ACUTE ONLY): 30 min   Charges:   PT Evaluation $PT Eval High Complexity: 1 Procedure PT Treatments $Gait Training: 8-22 mins   PT G CodesJeanie Cooks Argelio Granier 09-17-16, 10:23 AM  Pager [phone]

## 2016-09-06 NOTE — Progress Notes (Signed)
Patient ID: Luis Henderson, male   DOB: 09/07/1956, 60 y.o.   MRN: [id number] Subjective: Patient is alert and pleasant.    The patient is alert and pleasant. He has no complaints.   Objective: Vital signs in last 24 hours: Temp:  [98 F (36.7 C)-99.1 F (37.3 C)] 98 F (36.7 C) (11/22 1041) Pulse Rate:  [70-79] 79 (11/22 1041) Resp:  [20] 20 (11/22 1041) BP: (135-155)/(59-76) 137/76 (11/22 1041) SpO2:  [98 %-100 %] 98 % (11/22 1041)  Intake/Output from previous day: 11/21 0701 - 11/22 0700 In: 800 [P.O.:700; IV Piggyback:100] Out: 1305 [Urine:1305] Intake/Output this shift: No intake/output data recorded.  Physical exam patient is alert and oriented. His dressings are clean and dry. He is moving all 4 extremities. By report his gait is quite unsteady.  Lab Results:  Recent Labs  09/04/16 0449 09/05/16 0140  WBC 7.8 8.8  HGB 15.8 15.5  HCT 47.3 46.7  PLT 226 246   BMET  Recent Labs  09/04/16 0449 09/05/16 0140  NA 138 137  K 3.6 3.6  CL 106 104  CO2 24 24  GLUCOSE 122* 110*  BUN 15 10  CREATININE 1.13 1.09  CALCIUM 8.9 8.8*    Studies/Results: Ct Head Wo Contrast  Result Date: 09/05/2016 CLINICAL DATA:  Obstructive hydrocephalus. Status post shunt placement. EXAM: CT HEAD WITHOUT CONTRAST TECHNIQUE: Contiguous axial images were obtained from the base of the skull through the vertex without intravenous contrast. COMPARISON:  Brain MRI 09/04/2016 and head CT 09/03/2016 FINDINGS: Brain: The patient has undergone right parietal approach shunt catheter placement with the tip in the posterior left lateral ventricle. The ventricles have decreased in size, most notably seen in the temporal horns and third ventricle. Fluid density lesion at the cerebral aqueduct is unchanged. There is persistent periventricular hypoattenuation which is compatible with transependymal edema. There is no acute hemorrhage or extra-axial collection. There is trace pneumocephalus within the  temporal horn of the right lateral ventricle. Vascular: No hyperdense vessel or unexpected calcification. Skull: Right parietal burr hole Sinuses/Orbits: The visualized portions of the paranasal sinuses and mastoid air cells are free of fluid. No advanced mucosal thickening. The visualized orbits are normal. Other: None IMPRESSION: 1. Right parietal approach shunt catheter placement with decreased size of the lateral and third ventricles. 2. No intracranial hemorrhage or other new, acute abnormality. 3. 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 Electronically Signed   By: Ulyses Jarred M.D.   On: 09/05/2016 06:23    Assessment/Plan: Postop day #2: PT is recommending rehabilitation. I will ask the rehabilitation team to see the patient. He is stable for transfer to rehabilitation from my point of view.  LOS: 3 days     Makhiya Coburn D 09/06/2016, 10:58 AM

## 2016-09-06 NOTE — Progress Notes (Signed)
Inpatient Rehabilitation  OT is recommending IP Rehab.  Will await PT eval to assist in determining if IP Rehab consult is recommended.  Please call if questions.  [place] Admissions Coordinator Cell [phone] Office [phone]

## 2016-09-06 NOTE — Progress Notes (Signed)
Occupational Therapy Treatment Patient Details Name: Luis Henderson MRN: [id number] DOB: 13-Apr-1956 Today's Date: 09/06/2016    History of present illness 60 y.o. male with history of hypertension, hyperlipidemia and previous history of alcohol abuse was brought to the ER after the patient was found to be having increasing confusion over the last 1 month. Patient also was found to have gait difficulties with broad-based gait over the last 6 months and incontinence of urine.CT head shows features concerning for obstructive hydrocephalus. Placement of right retroauricular ventriculoperitoneal shunt 11/20   OT comments  Pt seen this am for ADL session. Pt appears internally distracted at times. Difficulty with sequencing and attending to task. Took pt 15 min to wash head/neck and then cues to complete task in timely manor. Continue to recommend CIR to facilitate safe DC home.  Follow Up Recommendations  CIR;Supervision/Assistance - 24 hour    Equipment Recommendations  Tub/shower seat    Recommendations for Other Services Rehab consult    Precautions / Restrictions Precautions Precautions: Fall Precaution Comments: multiple falls PTA Restrictions Weight Bearing Restrictions: No       Mobility Bed Mobility Overal bed mobility: Needs Assistance Bed Mobility: Supine to Sit      Transfers Overall transfer level: Needs assistance Equipment used: None Transfers: Sit to/from Stand;Stand Pivot Transfers Sit to Stand: Min assist Stand pivot transfers: Min assist         Balance Overall balance assessment: Needs assistance         Standing balance support: No upper extremity supported Standing balance-Leahy Scale: Good Standing balance comment: shifted left             High level balance activites: Backward walking;Direction changes;Turns;Sudden stops;Head turns High Level Balance Comments: decr balance with turns, head turns, sudden stop (which was delayed--taking 2  steps to stop)   ADL Overall ADL's : Needs assistance/impaired         Upper Body Bathing: Set up;Supervision/ safety;Sitting Upper Body Bathing Details (indicate cue type and reason): vc to complete task. Took 15 min to wah his face/neck Lower Body Bathing: Minimal assistance;Sit to/from stand Lower Body Bathing Details (indicate cue type and reason): Pt trying to hike 1 leg up on chair to wash bottom although pt unsteady Upper Body Dressing : Set up;Sitting   Lower Body Dressing: Minimal assistance;Cueing for safety;Sit to/from stand               Functional mobility during ADLs: Minimal assistance;Cueing for safety;Cueing for sequencing;Rolling walker General ADL Comments: Pt had difficulty switching tasks. Took 15 min to wash face, then vc to progress and wash the rest of his body. Pt appears internally distracted      Vision                 Additional Comments: will further assess   Perception     Praxis      Cognition   Behavior During Therapy: Impulsive Overall Cognitive Status: Impaired/Different from baseline Area of Impairment: Attention;Memory;Safety/judgement;Awareness;Problem solving   Current Attention Level: Selective Memory: Decreased short-term memory    Safety/Judgement: Decreased awareness of safety;Decreased awareness of deficits Awareness: Emergent Problem Solving: Decreased initiation;Requires verbal cues;Difficulty sequencing General Comments: unable to recall details of some falls "I just fall forwards, but mostly backwards" Unable to problem-solve how to pick an item up off the floor "I don't know what I would do.    Extremity/Trunk Assessment  Upper Extremity Assessment Upper Extremity Assessment: Defer to OT evaluation   Lower  Extremity Assessment Lower Extremity Assessment: Overall WFL for tasks assessed   Cervical / Trunk Assessment Cervical / Trunk Assessment: Other exceptions Cervical / Trunk Exceptions: lean to left with  incr weight bearing on LLE in static stance and during gait    Exercises     Shoulder Instructions       General Comments      Pertinent Vitals/ Pain       Pain Assessment: 0-10 Pain Score: 6  Pain Location: head Pain Descriptors / Indicators: Aching Pain Intervention(s): Limited activity within patient's tolerance  Home Living Family/patient expects to be discharged to:: Private residence Living Arrangements: Alone (mom will be moved to "facility" before he returns home) Available Help at Discharge: Friend(s) Type of Home: House Home Access: Stairs to enter Technical brewer of Steps: 2 Entrance Stairs-Rails: None Home Layout: One level     Bathroom Shower/Tub: Occupational psychologist: Standard     Home Equipment: Environmental consultant - 2 wheels   Additional Comments: pt states he could posible stay with a friend if needed      Prior Functioning/Environment Level of Independence: Independent        Comments: Mom lived with him; he was her caretaker; drove   Frequency  Min 2X/week        Progress Toward Goals  OT Goals(current goals can now be found in the care plan section)  Progress towards OT goals: Progressing toward goals  Acute Rehab OT Goals Patient Stated Goal: to stop falling OT Goal Formulation: With patient Time For Goal Achievement: 09/19/16 Potential to Achieve Goals: Good ADL Goals Pt Will Perform Lower Body Bathing: with modified independence;sit to/from stand Pt Will Perform Lower Body Dressing: with modified independence;sit to/from stand Pt Will Transfer to Toilet: with modified independence;ambulating Pt Will Perform Toileting - Clothing Manipulation and hygiene: with modified independence;sit to/from stand Additional ADL Goal #1: pt will demonstrate anticipatory awareness during ADL task without vc  Plan Discharge plan remains appropriate    Co-evaluation                 End of Session Equipment Utilized During  Treatment: Gait belt   Activity Tolerance Patient tolerated treatment well   Patient Left in chair;with call bell/phone within reach;with chair alarm set   Nurse Communication Mobility status        Time: 1100-1129 OT Time Calculation (min): 29 min  Charges: OT General Charges $OT Visit: 1 Procedure OT Treatments $Self Care/Home Management : 23-37 mins  Dayana Dalporto,HILLARY 09/06/2016, 12:43 PM   [facility], OTR/L  [phone] 09/06/2016

## 2016-09-06 NOTE — Progress Notes (Signed)
PT recommendation is for CIR due to patient's persistent due to gait imbalance and increased fall risk post operatively. At this time, we are recommending IP Rehab consult.  Please order if you are agreeable.    [place] Admissions Coordinator Cell [phone] Office [phone]

## 2016-09-06 NOTE — NC FL2 (Signed)
[place] MEDICAID FL2 LEVEL OF CARE SCREENING TOOL     IDENTIFICATION  Patient Name: Luis Henderson Birthdate: Apr 20, 1956 Sex: male Admission Date (Current Location): 09/03/2016  [facility] and [place] Number:  Herbalist and Address:  [facility], [place] [address] [place], [place] [address]      Provider Number: [id number]  Attending Physician Name and Address:  Newman Pies, MD  Relative Name and Phone Number:       Current Level of Care: Hospital Recommended Level of Care: [place] Prior Approval Number:    Date Approved/Denied: 09/06/16 PASRR Number: [id number]  Discharge Plan: SNF    Current Diagnoses: Patient Active Problem List   Diagnosis Date Noted  . Falls   . Generalized anxiety disorder   . Chronic bilateral low back pain without sciatica   . ETOH abuse   . Tobacco abuse   . HLD (hyperlipidemia) 09/04/2016  . 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 Obstructive hydrocephalus 09/03/2016  . Abnormal gait 09/03/2016  . Acute encephalopathy 09/03/2016  . Low back pain 06/09/2016  . HTN (hypertension) 04/01/2015  . Anxiety 04/01/2015    Orientation RESPIRATION BLADDER Height & Weight     Self, Time, Place, Situation  Normal Continent Weight: 230 lb 2.6 oz (104.4 kg) Height:  5\' 11"  (180.3 cm)  BEHAVIORAL SYMPTOMS/MOOD NEUROLOGICAL BOWEL NUTRITION STATUS      Continent Diet (Regular; thin liquids)  AMBULATORY STATUS COMMUNICATION OF NEEDS Skin   Limited Assist Verbally Surgical wounds (Closed incision; right Abdomen (gauze dressing0, right head (gauze dressing))                       Personal Care Assistance Level of Assistance  Bathing, Feeding, Dressing Bathing Assistance: Limited assistance Feeding assistance: Independent Dressing Assistance: Limited assistance     Functional Limitations Info  Sight, Hearing, Speech Sight Info: Adequate Hearing Info: Adequate Speech Info:  Adequate    SPECIAL CARE FACTORS FREQUENCY  PT (By licensed PT), OT (By licensed OT)     PT Frequency: 3x week OT Frequency: 3x week            Contractures Contractures Info: Not present    Additional Factors Info  Code Status, Allergies Code Status Info: Full Allergies Info: Codeine, Lisinopril           Current Medications (09/06/2016):  This is the current hospital active medication list Current Facility-Administered Medications  Medication Dose Route Frequency Provider Last Rate Last Dose  . acetaminophen (TYLENOL) tablet 650 mg  650 mg Oral Q4H PRN Newman Pies, MD   650 mg at 09/05/16 0920   Or  . acetaminophen (TYLENOL) suppository 650 mg  650 mg Rectal Q4H PRN Newman Pies, MD      . amLODipine (NORVASC) tablet 10 mg  10 mg Oral Daily Rise Patience, MD   10 mg at 09/06/16 1011  . atorvastatin (LIPITOR) tablet 20 mg  20 mg Oral q1800 Rise Patience, MD   20 mg at 09/05/16 1722  . busPIRone (BUSPAR) tablet 7.5 mg  7.5 mg Oral BID Rise Patience, MD   7.5 mg at 09/04/16 1055  . gabapentin (NEURONTIN) capsule 300 mg  300 mg Oral BID Rise Patience, MD   300 mg at 09/06/16 1011  . HYDROcodone-acetaminophen (NORCO/VICODIN) 5-325 MG per tablet 1-2 tablet  1-2 tablet Oral Q4H PRN Newman Pies, MD   2 tablet at 09/06/16 1011  . irbesartan (  AVAPRO) tablet 150 mg  150 mg Oral Daily Rise Patience, MD   150 mg at 09/06/16 1011  . labetalol (NORMODYNE,TRANDATE) injection 10-40 mg  10-40 mg Intravenous Q10 min PRN Newman Pies, MD   20 mg at 09/05/16 0024  . lactated ringers 1,000 mL with potassium chloride 20 mEq infusion   Intravenous Continuous Newman Pies, MD 75 mL/hr at 09/05/16 0027    . lactated ringers infusion   Intravenous Continuous Catalina Gravel, MD   Stopped at 09/05/16 [phone]  . morphine 2 MG/ML injection 1-2 mg  1-2 mg Intravenous Q2H PRN Newman Pies, MD   2 mg at 09/05/16 0025  . ondansetron (ZOFRAN) tablet 4 mg  4  mg Oral Q4H PRN Newman Pies, MD       Or  . ondansetron [facility]) injection 4 mg  4 mg Intravenous Q4H PRN Newman Pies, MD   4 mg at 09/04/16 2346  . pantoprazole (PROTONIX) EC tablet 40 mg  40 mg Oral QHS Newman Pies, MD      . pneumococcal 23 valent vaccine (PNU-IMMUNE) injection 0.5 mL  0.5 mL Intramuscular Tomorrow-1000 Newman Pies, MD      . promethazine (PHENERGAN) tablet 12.5-25 mg  12.5-25 mg Oral Q4H PRN Newman Pies, MD      . Derrill Memo ON 09/07/2016] thiamine (VITAMIN B-1) tablet 100 mg  100 mg Oral Daily Newman Pies, MD         Discharge Medications: Please see discharge summary for a list of discharge medications.  Relevant Imaging Results:  Relevant Lab Results:   Additional Information SSN: [id number]  Alla German, LCSW

## 2016-09-06 NOTE — Consult Note (Signed)
Physical Medicine and Rehabilitation Consult  Reason for Consult: Gait disorder and confusion Referring Physician: Dr. Arnoldo Morale   HPI: Luis Henderson is a 60 y.o. male with history of  HTN, anxiety disorder, chronic back pain, alcohol abuse who was admitted 09/03/16 with one month history of confusion and reports of 6 week history of gait disorder with falls as well as incontinence. He was found to have obstructive hydrocephalus on CT and was admitted for management. He was evaluated by Dr. Arnoldo Morale and underwent placement of VPS on 09/04/16.  Therapy evaluations done and CIR recommended due to cognitive deficits, poor safety, dizziness and gait disorder.     Review of Systems  Constitutional: Negative for diaphoresis.  HENT: Negative for hearing loss and tinnitus.   Eyes: Negative for blurred vision (vision improved) and double vision.  Respiratory: Negative for cough and hemoptysis.   Cardiovascular: Negative for chest pain and palpitations.  Gastrointestinal: Positive for constipation. Negative for heartburn and nausea.  Genitourinary: Negative for frequency and urgency.       Incontinence much improved now  Musculoskeletal: Positive for back pain and myalgias.  Skin: Negative for itching and rash.  Neurological: Positive for headaches (since surgery). Negative for dizziness, tingling, sensory change and weakness.  Psychiatric/Behavioral: Positive for memory loss.  All other systems reviewed and are negative.    Past Medical History:  Diagnosis Date  . Anxiety   . Depression   . Diabetes mellitus   . Hyperlipidemia   . Hypertension     Past Surgical History:  Procedure Laterality Date  . CYSTOSCOPY WITH RETROGRADE PYELOGRAM, URETEROSCOPY AND STENT PLACEMENT Right 01/15/2015   Procedure: CYSTOSCOPY, RIGHT URETEROSCOPY/ RETROGRADE PYELOGRAM,HOLMIUM LASER/ LITHOTRIPSY, STENT PLACEMENT, URETERAL BALLOON DIALATION;  Surgeon: Kathie Rhodes, MD;  Location: [facility];  Service:  Urology;  Laterality: Right;  . SKIN GRAFT     left hand  . VENTRICULOPERITONEAL SHUNT Right 09/04/2016   Procedure: SHUNT INSERTION VENTRICULAR-PERITONEAL;  Surgeon: Newman Pies, MD;  Location: [facility];  Service: Neurosurgery;  Laterality: Right;  . WISDOM TOOTH EXTRACTION      Family History  Problem Relation Age of Onset  . Hyperlipidemia Father     Social History:  Was living with mother who has moved to assisted living.  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 He reports that he has been smoking Cigarettes--1/2 PPD.  He has a 14.00 pack-year smoking history. He has never used smokeless tobacco. He reports that he drinks about  4 oz distilled liquor daily.  He reports that he does not use drugs.    Allergies  Allergen Reactions  . Codeine Itching  . Lisinopril Cough    Medications Prior to Admission  Medication Sig Dispense Refill  . amLODipine (NORVASC) 10 MG tablet Take 1 tablet (10 mg total) by mouth daily. 30 tablet 5  . atorvastatin (LIPITOR) 20 MG tablet Take 1 tablet (20 mg total) by mouth daily. 90 tablet 2  . busPIRone (BUSPAR) 7.5 MG tablet Take 1 tablet (7.5 mg total) by mouth 2 (two) times daily. 60 tablet 5  . cyclobenzaprine (FLEXERIL) 10 MG tablet Take 1 tablet (10 mg total) by mouth at bedtime. 30 tablet 1  . gabapentin (NEURONTIN) 300 MG capsule Take 1 capsule (300 mg total) by mouth 2 (two) times daily. 60 capsule 5  . meloxicam (MOBIC) 7.5 MG tablet Take 1 tablet (7.5 mg total) by mouth daily. 30 tablet 3  . traMADol (ULTRAM) 50 MG tablet  Take 1 tablet (50 mg total) by mouth every 8 (eight) hours as needed. (Patient taking differently: Take 50 mg by mouth every 8 (eight) hours as needed for moderate pain. ) 60 tablet 1  . valsartan (DIOVAN) 160 MG tablet Take 1 tablet (160 mg total) by mouth daily. 30 tablet 5  . aspirin EC 81 MG tablet Take 81 mg by mouth daily.      Home: Home Living Family/patient expects to be discharged to:: Private  residence Living Arrangements: Alone (mom will be moved to "facility" before he returns home) Available Help at Discharge: Friend(s) Type of Home: House Home Access: Stairs to enter [employer] of Steps: 2 Entrance Stairs-Rails: None Home Layout: One level Bathroom Shower/Tub: Multimedia programmer: [place]: Environmental consultant - 2 wheels Additional Comments: pt states he could posible stay with a friend if needed  Functional History: Prior Function Level of Independence: Independent Comments: Mom lived with him; he was her caretaker; drove Functional Status:  Mobility: Bed Mobility Overal bed mobility: Needs Assistance Bed Mobility: Supine to Sit Supine to sit: Supervision General bed mobility comments: with heavy use of bedrail (when asked to try not to use it, he continued to use it) Transfers Overall transfer level: Needs assistance Equipment used: None Transfers: Sit to/from Stand Sit to Stand: Min assist Stand pivot transfers: Min assist General transfer comment: on initial stance reports dizziness with slight imbalance towards his left (reports he had dizziness with changes in position PTA; improved post-op) Ambulation/Gait Ambulation/Gait assistance: Min assist Ambulation Distance (Feet): 180 Feet Assistive device: None (gait belt) Gait Pattern/deviations: Step-through pattern, Decreased stride length, Decreased weight shift to right, Drifts right/left, Wide base of support, Staggering left General Gait Details: Patient significantly leaning to his left during gait (including drift to his left). He could not fully correct to midline with cues and only briefly maintained this correction. Patient staggers to left with min assist to recover when turning his head while walking (pt doing this spontaneously, looking into rooms, with decr awareness of how it impacts his balance and causes drift/stagger). Gait velocity: decr however able to vary up/down on  command. Left lean worsens with decr velocity Gait velocity interpretation: Below normal speed for age/gender    ADL: ADL Overall ADL's : Needs assistance/impaired Eating/Feeding: Independent Grooming: Set up Upper Body Bathing: Set up, Supervision/ safety, Sitting Lower Body Bathing: Minimal assistance, Sit to/from stand Upper Body Dressing : Set up, Sitting Lower Body Dressing: Minimal assistance, Sit to/from stand Toilet Transfer: Minimal assistance, Ambulation Toileting- Clothing Manipulation and Hygiene: Maximal assistance Toileting - Clothing Manipulation Details (indicate cue type and reason): incontinent of urine Functional mobility during ADLs: Minimal assistance, Cueing for safety, Cueing for sequencing, Rolling walker (pt with L lateral lean at times. Pt picking up RW to push op)  Cognition: Cognition Overall Cognitive Status: No family/caregiver present to determine baseline cognitive functioning Orientation Level: Oriented X4 Cognition Arousal/Alertness: Awake/alert Behavior During Therapy: WFL for tasks assessed/performed Overall Cognitive Status: No family/caregiver present to determine baseline cognitive functioning Area of Impairment: Memory, Following commands, Awareness Memory: Decreased short-term memory (Prior to surgery as well) Awareness: Emergent General Comments: unable to recall details of some falls "I just fall forwards, but mostly backwards" Unable to problem-solve how to pick an item up off the floor "I don't know what I would do.   Blood pressure 137/76, pulse 79, temperature 98 F (36.7 C), temperature source Oral, resp. rate 20, height 5\' 11"  (1.803 m), weight 104.4 kg (  230 lb 2.6 oz), SpO2 98 %. Physical Exam  Nursing note and vitals reviewed. Constitutional: He is oriented to person, place, and time. He appears well-developed and well-nourished.  Pt sit/stand, ambulating in the room independently  HENT:  Mouth/Throat: Oropharynx is clear and  moist.  Crani incision right scalp with dry dressing.   Eyes: Conjunctivae are normal. Pupils are equal, round, and reactive to light.  Neck: Normal range of motion. Neck supple.  Cardiovascular: Normal rate and regular rhythm.   Respiratory: Effort normal and breath sounds normal. No stridor. No respiratory distress. He has no wheezes.  GI: Soft. Bowel sounds are normal. He exhibits no distension. There is no tenderness.  Musculoskeletal: He exhibits no edema or tenderness.  Neurological: He is alert and oriented to person, place, and time.  Motor: 5/5 throughout Sensation intact to light touch  Skin: Skin is warm and dry.  Psychiatric: He has a normal mood and affect. His behavior is normal. Thought content normal.    No results found for this or any previous visit (from the past 24 hour(s)). Ct Head Wo Contrast  Result Date: 09/05/2016 CLINICAL DATA:  Obstructive hydrocephalus. Status post shunt placement. EXAM: CT HEAD WITHOUT CONTRAST TECHNIQUE: Contiguous axial images were obtained from the base of the skull through the vertex without intravenous contrast. COMPARISON:  Brain MRI 09/04/2016 and head CT 09/03/2016 FINDINGS: Brain: The patient has undergone right parietal approach shunt catheter placement with the tip in the posterior left lateral ventricle. The ventricles have decreased in size, most notably seen in the temporal horns and third ventricle. Fluid density lesion at the cerebral aqueduct is unchanged. There is persistent periventricular hypoattenuation which is compatible with transependymal edema. There is no acute hemorrhage or extra-axial collection. There is trace pneumocephalus within the temporal horn of the right lateral ventricle. Vascular: No hyperdense vessel or unexpected calcification. Skull: Right parietal burr hole Sinuses/Orbits: The visualized portions of the paranasal sinuses and mastoid air cells are free of fluid. No advanced mucosal thickening. The visualized  orbits are normal. Other: None IMPRESSION: 1. Right parietal approach shunt catheter placement with decreased size of the lateral and third ventricles. 2. No intracranial hemorrhage or other new, acute abnormality. 3. 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 Electronically Signed   By: Ulyses Jarred M.D.   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  Records reviewed and summated above.  1. Does the need for close, 24 hr/day medical supervision in concert with the patient's rehab needs make it unreasonable for this patient to be served in a less intensive setting? No  2. Co-Morbidities requiring supervision/potential complications: HTN (monitor and provide prns in accordance with increased physical exertion and pain), anxiety disorder (ensure anxiety and resulting apprehension do not limit functional progress; consider prn medications if warranted), chronic back pain (Biofeedback training with therapies to help reduce reliance on opiate pain medications, monitor pain control during therapies, and sedation at rest and titrate to maximum efficacy to ensure participation and gains in therapies), alcohol abuse (counsel), tobacco abuse (counsel) 3. Due to safety, skin/wound care, disease management and patient education, does the patient require 24 hr/day rehab nursing? Yes 4. Does the patient require coordinated care of a physician, rehab nurse, PT (1-2 hrs/day, 5 days/week) and OT (1-2 hrs/day, 5. days/week) to address physical and functional deficits in the context of the above medical diagnosis(es)? No Addressing deficits  in the following areas: balance, endurance, locomotion and toileting 5. Can the patient actively participate in an intensive therapy program of at least 3 hrs of therapy per day at least 5 days per week? Yes 6. The potential for patient  to make measurable gains while on inpatient rehab is NA 7. Anticipated functional outcomes upon discharge from inpatient rehab are n/a  with PT, n/a with OT, n/a with SLP. 8. Estimated rehab length of stay to reach the above functional goals is: NA 9. Does the patient have adequate social supports and living environment to accommodate these discharge functional goals? N/A 10. Anticipated D/C setting: SNF 11. Anticipated post D/C treatments: SNF 12. Overall Rehab/Functional Prognosis: good and fair  RECOMMENDATIONS: This patient's condition is appropriate for continued rehabilitative care in the following setting: If pt unsafe to home with [facility], recommend SNF.  Recommend follow up with PM&R for NCS/EMG. Patient has agreed to participate in recommended program. Potentially Note that insurance prior authorization may be required for reimbursement for recommended care.  Comment: Rehab Admissions Coordinator to follow up.  Delice Lesch, MD, Mellody Drown 09/06/2016

## 2016-09-07 NOTE — Progress Notes (Signed)
Patient ID: Luis Henderson, male   DOB: 10/23/1955, 60 y.o.   MRN: [id number] Subjective: Patient reports no real headache, some dizziness when he first sits up, feels like he is walking a little better, occasionally feels like he has a "third hand." Some soreness along the shunt tract  Objective: Vital signs in last 24 hours: Temp:  [97.8 F (36.6 C)-99.2 F (37.3 C)] 97.8 F (36.6 C) (11/23 0513) Pulse Rate:  [65-79] 79 (11/23 0513) Resp:  [20] 20 (11/23 0513) BP: (137-162)/(72-89) 149/89 (11/23 0513) SpO2:  [96 %-100 %] 96 % (11/23 0513)  Intake/Output from previous day: 11/22 0701 - 11/23 0700 In: 2580 [P.O.:480; I.V.:2100] Out: -  Intake/Output this shift: Total I/O In: 2580 [P.O.:480; I.V.:2100] Out: -   Neurologic: Grossly normal  Lab Results: Lab Results  Component Value Date   WBC 8.8 09/05/2016   HGB 15.5 09/05/2016   HCT 46.7 09/05/2016   MCV 90.2 09/05/2016   PLT 246 09/05/2016   Lab Results  Component Value Date   INR 1.03 09/04/2016   BMET Lab Results  Component Value Date   NA 137 09/05/2016   K 3.6 09/05/2016   CL 104 09/05/2016   CO2 24 09/05/2016   GLUCOSE 110 (H) 09/05/2016   BUN 10 09/05/2016   CREATININE 1.09 09/05/2016   CALCIUM 8.8 (L) 09/05/2016    Studies/Results: Ct Head Wo Contrast  Result Date: 09/05/2016 CLINICAL DATA:  Obstructive hydrocephalus. Status post shunt placement. EXAM: CT HEAD WITHOUT CONTRAST TECHNIQUE: Contiguous axial images were obtained from the base of the skull through the vertex without intravenous contrast. COMPARISON:  Brain MRI 09/04/2016 and head CT 09/03/2016 FINDINGS: Brain: The patient has undergone right parietal approach shunt catheter placement with the tip in the posterior left lateral ventricle. The ventricles have decreased in size, most notably seen in the temporal horns and third ventricle. Fluid density lesion at the cerebral aqueduct is unchanged. There is persistent periventricular  hypoattenuation which is compatible with transependymal edema. There is no acute hemorrhage or extra-axial collection. There is trace pneumocephalus within the temporal horn of the right lateral ventricle. Vascular: No hyperdense vessel or unexpected calcification. Skull: Right parietal burr hole Sinuses/Orbits: The visualized portions of the paranasal sinuses and mastoid air cells are free of fluid. No advanced mucosal thickening. The visualized orbits are normal. Other: None IMPRESSION: 1. Right parietal approach shunt catheter placement with decreased size of the lateral and third ventricles. 2. No intracranial hemorrhage or other new, acute abnormality. 3. 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 Electronically Signed   By: Ulyses Jarred M.D.   On: 09/05/2016 06:23    Assessment/Plan: Overall doing fairly well and progressing nicely, still needs help with ambulation   LOS: 4 days    JONES,DAVID S 09/07/2016, 5:15 AM

## 2016-09-07 NOTE — Progress Notes (Signed)
Pt speaks oddly. "I sometimes feel like I have a 3rd hand, it has lotion or H2O on it.  I feel like I'm floating on a heavy foam."

## 2016-09-07 NOTE — Progress Notes (Addendum)
Pt having difficulty falling and staying asleep, he is also having auditory and tactile hallucinations, hearing sounds in room that are not there and feels as if he has a third arm/hand.  He is not terably disturbed by these occurences as they are not constant but does say they are distracting.

## 2016-09-08 NOTE — Discharge Instructions (Signed)
No lifting no bending no twisting no driving a riding a car unless he is calm back and forth to see Dr. Arnoldo Morale.

## 2016-09-08 NOTE — Progress Notes (Signed)
Patient ID: Luis Henderson, male   DOB: 04/02/1956, 60 y.o.   MRN: [id number] Patient doing well  No significant headache incision clean dry and intact neurologically nonfocal ambulating well  Discharge home

## 2016-09-08 NOTE — Discharge Summary (Signed)
  Physician Discharge Summary  Patient ID: Luis Henderson MRN: [id number] DOB/AGE: 21-Oct-1955 60 y.o.  Admit date: 09/03/2016 Discharge date: 09/08/2016  Admission Diagnoses:Posterior fossa cystic mass hydrocephalus  Discharge Diagnoses: Same Principal Problem:   Obstructive hydrocephalus Active Problems:   HTN (hypertension)   Abnormal gait   Acute encephalopathy   HLD (hyperlipidemia)   Acquired obstructive hydrocephalus   Falls   Generalized anxiety disorder   Chronic bilateral low back pain without sciatica   ETOH abuse   Tobacco abuse   Discharged Condition: good  Hospital Course: Patient admitted hospital underwent ventricular peritoneal shunt and postoperatively patient did very well recovered in the floor on the floor was angling and voiding spontaneously tolerating regular diet stable for discharge home.  Consults: Significant Diagnostic Studies: Treatments: Ventriculoperitoneal shunt Discharge Exam: Blood pressure (!) 153/77, pulse 61, temperature 98.3 F (36.8 C), temperature source Oral, resp. rate 18, height 5\' 11"  (1.803 m), weight 104.4 kg (230 lb 2.6 oz), SpO2 99 %. Strength out of 5 wound clean dry and intact  Disposition: Home  Discharge Instructions    Ambulatory referral to Physical Medicine Rehab    Complete by:  As directed    NCS/EMG of [facility]       Medication List    TAKE these medications   amLODipine 10 MG tablet Commonly known as:  NORVASC Take 1 tablet (10 mg total) by mouth daily.   aspirin EC 81 MG tablet Take 81 mg by mouth daily.   atorvastatin 20 MG tablet Commonly known as:  LIPITOR Take 1 tablet (20 mg total) by mouth daily.   busPIRone 7.5 MG tablet Commonly known as:  BUSPAR Take 1 tablet (7.5 mg total) by mouth 2 (two) times daily.   cyclobenzaprine 10 MG tablet Commonly known as:  FLEXERIL Take 1 tablet (10 mg total) by mouth at bedtime.   gabapentin 300 MG capsule Commonly known as:  NEURONTIN Take 1 capsule  (300 mg total) by mouth 2 (two) times daily.   meloxicam 7.5 MG tablet Commonly known as:  MOBIC Take 1 tablet (7.5 mg total) by mouth daily.   traMADol 50 MG tablet Commonly known as:  ULTRAM Take 1 tablet (50 mg total) by mouth every 8 (eight) hours as needed. What changed:  reasons to take this   valsartan 160 MG tablet Commonly known as:  DIOVAN Take 1 tablet (160 mg total) by mouth daily.      Follow-up Information    Ophelia Charter, MD Follow up in 2 day(s).   Specialty:  Neurosurgery Contact information: [address] [place] [address] [phone]        Ophelia Charter, MD .   Specialty:  Neurosurgery Contact information: [address] [place] [address] [phone]           Signed: Elaina Hoops 09/08/2016, 9:42 AM

## 2016-09-08 NOTE — Progress Notes (Signed)
Noted patient has a recommendation to be sent to the SNF, spoke with MD about it, but he said he feels okay to send patient home as patient feels dizziness when he first sit up in bed.

## 2016-09-08 NOTE — Care Management Note (Addendum)
Case Management Note  Patient Details  Name: Luis Henderson MRN: [id number] Date of Birth: [birth year]  Subjective/Objective:                    Action/Plan: Pt discharging home with self care. Pt does not want to go to SNF for rehab. Pt states he has support at home. No new medications at discharge and pt already active with [facility]. Tub bench recommended but pt states he already has one.  No further needs per CM.    Expected Discharge Date:   (Pending)               Expected Discharge Plan:  Home/Self Care  In-House Referral:     Discharge planning Services  CM Consult  Post Acute Care Choice:    Choice offered to:     DME Arranged:    DME Agency:     HH Arranged:    [place] Agency:     Status of Service:  Completed, signed off  If discussed at [employer] of Stay Meetings, dates discussed:    Additional Comments:  Pollie Friar, RN 09/08/2016, 11:06 AM

## 2016-09-08 NOTE — Progress Notes (Signed)
Patient is being d/c home. D/c instructions given and patient verbalized understanding. 

## 2016-09-27 ENCOUNTER — Encounter: Payer: Self-pay | Admitting: Family Medicine

## 2016-09-27 ENCOUNTER — Ambulatory Visit: Payer: Self-pay | Attending: Family Medicine | Admitting: Family Medicine

## 2016-09-27 ENCOUNTER — Encounter: Payer: Self-pay | Admitting: Licensed Clinical Social Worker

## 2016-09-27 VITALS — BP 154/75 | HR 97 | Temp 98.9°F | Resp 16 | Ht 71.0 in | Wt 240.0 lb

## 2016-09-27 DIAGNOSIS — E785 Hyperlipidemia, unspecified: Secondary | ICD-10-CM | POA: Insufficient documentation

## 2016-09-27 DIAGNOSIS — E7800 Pure hypercholesterolemia, unspecified: Secondary | ICD-10-CM | POA: Insufficient documentation

## 2016-09-27 DIAGNOSIS — Z7982 Long term (current) use of aspirin: Secondary | ICD-10-CM | POA: Insufficient documentation

## 2016-09-27 DIAGNOSIS — M5441 Lumbago with sciatica, right side: Secondary | ICD-10-CM | POA: Insufficient documentation

## 2016-09-27 DIAGNOSIS — F419 Anxiety disorder, unspecified: Secondary | ICD-10-CM | POA: Insufficient documentation

## 2016-09-27 DIAGNOSIS — M5442 Lumbago with sciatica, left side: Secondary | ICD-10-CM | POA: Insufficient documentation

## 2016-09-27 DIAGNOSIS — R7303 Prediabetes: Secondary | ICD-10-CM | POA: Insufficient documentation

## 2016-09-27 DIAGNOSIS — G8929 Other chronic pain: Secondary | ICD-10-CM | POA: Insufficient documentation

## 2016-09-27 DIAGNOSIS — I10 Essential (primary) hypertension: Secondary | ICD-10-CM | POA: Insufficient documentation

## 2016-09-27 DIAGNOSIS — F418 Other specified anxiety disorders: Secondary | ICD-10-CM

## 2016-09-27 DIAGNOSIS — Z982 Presence of cerebrospinal fluid drainage device: Secondary | ICD-10-CM | POA: Insufficient documentation

## 2016-09-27 DIAGNOSIS — F329 Major depressive disorder, single episode, unspecified: Secondary | ICD-10-CM | POA: Insufficient documentation

## 2016-09-27 MED ORDER — FLUOXETINE HCL 20 MG PO TABS: 20.0000 mg | ORAL_TABLET | Freq: Every day | ORAL | 3 refills | Status: DC

## 2016-09-27 MED ORDER — ATORVASTATIN CALCIUM 20 MG PO TABS: 20.0000 mg | ORAL_TABLET | Freq: Every day | ORAL | 3 refills | Status: DC

## 2016-09-27 MED ORDER — VALSARTAN 320 MG PO TABS: 320.0000 mg | ORAL_TABLET | Freq: Every day | ORAL | 3 refills | Status: DC

## 2016-09-27 MED FILL — FLUoxetine HCL 20 MG CAPS: 20 | 30 days supply | Qty: 30 | Fill #0

## 2016-09-27 MED FILL — ?VALSARTAN 320 MG TABLET: 320 MG | 30 days supply | Qty: 30 | Fill #0

## 2016-09-27 MED FILL — ATORVASTATIN 20 MG TABLET: 20 | 30 days supply | Qty: 30 | Fill #0

## 2016-09-27 NOTE — BH Specialist Note (Signed)
Session Start time: 2:55 pm   End Time: 3:15 pm Total Time:  20 minutes Type of Service: [place] Interpreter: No.   Interpreter Name & Language: N/A  # [facility] Visits July 2017-June 2018: 2nd   SUBJECTIVE: Luis Henderson is a 60 y.o. male  Pt. was referred by Dr. Jarold Song for:  depression. Pt. 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 Currently not receiving services.   OBJECTIVE: Mood: Depressed & Affect: Depressed Risk of harm to self or others: Pt denied SI/HI Assessments administered: PHQ-9; GAD-7  LIFE CONTEXT:  Family & Social: Pt resides with elderly mother. Pt has a sister who resides nearby, provides limited support School/ Work: Pt is unemployed and receives financial support from elderly mother Self-Care: Pt has difficulty sleeping and snacks to cope with stress.  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 He is still primary caregiver to mother diagnosed with dementia. What is important to pt/family (values): Family, Health, Financial Independence   GOALS ADDRESSED:  Decrease symptoms of depression  INTERVENTIONS: Solution Focused, Strength-based and Supportive   ASSESSMENT:  Pt currently experiencing depression triggered by chronic pain, providing care to elderly parent, and recent surgery to remove cyst from brain. Pt disclosed that his symptoms of depression have increased due to limited knowledge of how the cyst may have negatively impacted his decision making over the years, in addition, to his physical health. Pt reports difficulty sleeping, racing thoughts, and decreased mood.  Pt may benefit from psychoeducation, psychotherapy, and  medication management. LCSWA and pt discussed effectiveness of pt utilizing healthy coping skills and created a plan to address barriers. Pt reported that he is open to medication management and allowing his sister to assist in the care of their mother as additional coping strategies to decrease symptoms of depression. LCSWA strongly encouraged pt to re-initiate psychotherapy to assist in processing feelings regarding recent surgery and the stress of providing care to mother. Pt agreed and was provided behavioral health resources.       PLAN: 1. F/U with behavioral health clinician: Pt was encouraged tocontact LCSWA if symptoms worsen or fail to improveto schedule behavioral appointments at [facility]. 2. Behavioral Health meds: Buspar and Prozac 3. Behavioral recommendations: LCSWA recommends that pt apply healthy coping skills discussed and initiate behavioral health resources. Pt is encouraged to schedule follow up appointment with LCSWA 4. Referral: Brief Counseling/Psychotherapy, [employer], Problem-solving teaching/coping strategies, Psychoeducation and Supportive Counseling 5. From scale of 1-10, how likely are you to follow plan: 7/10   Rebekah Chesterfield, MSW, [place] Worker 09/27/16 4:37 pm  Warmhandoff:   Warm Hand Off Completed.

## 2016-09-27 NOTE — Progress Notes (Signed)
Depression

## 2016-09-28 NOTE — Progress Notes (Signed)
Subjective:  Patient ID: Luis Henderson, male    DOB: September 30, 1956  Age: 60 y.o. MRN: [id number]  CC: Depression   HPI Luis Henderson is a 60 year old male with a history of hypertension, anxiety, chronic low back pain here for follow-up visit.   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 Post op course was uneventful.  Since discharge he had had a post op visit with his Neurosurgeon and his next appointment is in 3 months. He denies headaches, blurry vision.  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 Already takes Buspar for anxiety but he has lost pleasure in doing things, is sad a lot. Denies suicidal ideations or intents. To have low back pain. States which does not respond to anoxic Hamm, tramadol and Flexeril and he would like those discontinued from his list; he is refusing Tylenol 3 and would rather just take aspirin or ibuprofen.  Past Medical History:  Diagnosis Date  . Anxiety   . Depression   . History of prediabetes   . Hyperlipidemia   . Hypertension     Past Surgical History:  Procedure Laterality Date  . CYSTOSCOPY WITH RETROGRADE PYELOGRAM, URETEROSCOPY AND STENT PLACEMENT Right 01/15/2015   Procedure: CYSTOSCOPY, RIGHT URETEROSCOPY/ RETROGRADE PYELOGRAM,HOLMIUM LASER/ LITHOTRIPSY, STENT PLACEMENT, URETERAL BALLOON DIALATION;  Surgeon: Kathie Rhodes, MD;  Location: [facility];  Service: Urology;  Laterality: Right;  . SKIN GRAFT     left hand  . VENTRICULOPERITONEAL SHUNT Right 09/04/2016   Procedure: SHUNT INSERTION VENTRICULAR-PERITONEAL;  Surgeon: Newman Pies, MD;  Location: [facility];  Service: Neurosurgery;  Laterality: Right;  . WISDOM TOOTH EXTRACTION      Allergies  Allergen Reactions  . Codeine Itching  . Lisinopril Cough      Outpatient Medications Prior to Visit  Medication Sig Dispense Refill  . amLODipine (NORVASC) 10 MG tablet Take 1 tablet (10 mg total) by mouth daily. 30 tablet 5  . busPIRone (BUSPAR) 7.5 MG tablet Take 1 tablet (7.5 mg total) by mouth 2 (two) times daily. 60 tablet 5  . gabapentin (NEURONTIN) 300 MG capsule Take 1 capsule (300 mg total) by mouth 2 (two) times daily. 60 capsule 5  . traMADol (ULTRAM) 50 MG tablet Take 1 tablet (50 mg total) by mouth every 8 (eight) hours as needed. (Patient taking differently: Take 50 mg by mouth every 8 (eight) hours as needed for moderate pain. ) 60 tablet 1  . atorvastatin (LIPITOR) 20 MG tablet Take 1 tablet (20 mg total) by mouth daily. 90 tablet 2  . valsartan (DIOVAN) 160 MG tablet Take 1 tablet (160 mg total) by mouth daily. 30 tablet 5  . aspirin EC 81 MG tablet Take 81 mg by mouth daily.    . meloxicam (MOBIC) 7.5 MG tablet Take 1 tablet (7.5 mg total) by mouth daily. (Patient not taking: Reported on 09/27/2016) 30 tablet 3  . cyclobenzaprine (FLEXERIL) 10 MG tablet Take 1 tablet (10 mg total) by mouth at bedtime. (Patient not taking: Reported on 09/27/2016) 30 tablet 1   No facility-administered medications prior to visit.     ROS Review of Systems  Constitutional: Negative for activity change and appetite change.  HENT: Negative for sinus pressure and sore throat.   Eyes: Negative for visual disturbance.  Respiratory: Negative for  cough, chest tightness and shortness of breath.   Cardiovascular: Negative for chest pain and leg swelling.  Gastrointestinal: Negative for abdominal distention, abdominal pain, constipation and diarrhea.  Endocrine: Negative.   Genitourinary: Negative for dysuria.  Musculoskeletal: Positive for back pain. Negative for joint swelling and myalgias.  Skin: Negative for rash.  Allergic/Immunologic: Negative.   Neurological: Negative for weakness, light-headedness and numbness.  Psychiatric/Behavioral: Positive  for dysphoric mood. Negative for suicidal ideas.    Objective:  BP (!) 154/75   Pulse 97   Temp 98.9 F (37.2 C) (Oral)   Resp 16   Ht 5\' 11"  (1.803 m)   Wt 240 lb (108.9 kg)   SpO2 99%   BMI 33.47 kg/m   BP/Weight 09/27/2016 09/08/2016 [id number]  Systolic BP 123456 0000000 -  Diastolic BP 75 77 -  Wt. (Lbs) 240 - 230.16  BMI 33.47 - 32.1      Physical Exam Constitutional: He is oriented to person, place, and time. He appears well-developed and well-nourished.  Cardiovascular: Normal rate, normal heart sounds and intact distal pulses.   No murmur heard. Pulmonary/Chest: Effort normal and breath sounds normal. He has no wheezes. He has no rales. He exhibits no tenderness.  Abdominal: Soft. Bowel sounds are normal. He exhibits no distension and no mass. There is no tenderness.  Musculoskeletal: No tenderness ellicited on palpation and negative straight leg raise bilaterally. Patient observed to maintain a posture suggestive of pain on getting up from a sitting position and on walking.  Neurological: He is alert and oriented to person, place, and time. Normal muscle strength in lower extremities, normal reflexes, normal sensation.  Skin: Right temporal healed surgical scar from VP shunt; scar on abdomen as well  Assessment & Plan:   1. Essential hypertension Uncontrolled Increased dose of valsartan Low-sodium diet and lifestyle modifications - valsartan (DIOVAN) 320 MG tablet; Take 1 tablet (320 mg total) by mouth daily.  Dispense: 30 tablet; Refill: 3   2. Anxiety Continue Buspar   3. Chronic midline low back pain with bilateral sciatica Discontinue tramadol and Flexeril which are ineffective Patient will continue OTC analgesics  4. Prediabetes Last A1c was 6.0  5. Pure hypercholesterolemia Continue low-cholesterol diet - atorvastatin (LIPITOR) 20 MG tablet; Take 1 tablet (20 mg total) by mouth daily.  Dispense: 30 tablet; Refill: 3  6. Anxiety and depression Placed  on Prozac Continue this for  7. S/P VP shunt Stable Keep appointment with neurosurgery   Meds ordered this encounter  Medications  . FLUoxetine (PROZAC) 20 MG tablet    Sig: Take 1 tablet (20 mg total) by mouth daily.    Dispense:  30 tablet    Refill:  3  . valsartan (DIOVAN) 320 MG tablet    Sig: Take 1 tablet (320 mg total) by mouth daily.    Dispense:  30 tablet    Refill:  3    Discontinue previous dose  . atorvastatin (LIPITOR) 20 MG tablet    Sig: Take 1 tablet (20 mg total) by mouth daily.    Dispense:  30 tablet    Refill:  3    Follow-up: No Follow-up on file.   Arnoldo Morale MD

## 2016-10-11 ENCOUNTER — Ambulatory Visit: Payer: Self-pay | Attending: Family Medicine

## 2016-10-18 ENCOUNTER — Ambulatory Visit: Payer: Self-pay | Attending: Family Medicine

## 2016-10-30 MED FILL — ATORVASTATIN 20 MG TABLET: 20 | 30 days supply | Qty: 30 | Fill #1

## 2016-10-30 MED FILL — FLUoxetine HCL 20 MG CAPS: 20 | 30 days supply | Qty: 30 | Fill #1

## 2016-10-30 MED FILL — AMLODIPINE BESYLATE 10 MG T: 10 | 30 days supply | Qty: 30 | Fill #1

## 2016-10-30 MED FILL — VALSARTAN 320 MG TABLET: 320 | 30 days supply | Qty: 30 | Fill #1

## 2016-11-29 MED FILL — ATORVASTATIN 20 MG TABLET: 20 | 30 days supply | Qty: 30 | Fill #2

## 2016-11-29 MED FILL — FLUoxetine HCL 20 MG CAPS: 20 | 30 days supply | Qty: 30 | Fill #2

## 2016-11-29 MED FILL — VALSARTAN 320 MG TABLET: 320 | 30 days supply | Qty: 30 | Fill #2

## 2016-11-29 MED FILL — AMLODIPINE BESYLATE 10 MG T: 10 | 30 days supply | Qty: 30 | Fill #2

## 2017-02-14 MED FILL — AMLODIPINE BESYLATE 10 MG T: 10 | 30 days supply | Qty: 30 | Fill #3 | Status: TO

## 2017-02-14 MED FILL — VALSARTAN 320 MG TABLET: 320 | 30 days supply | Qty: 30 | Fill #3

## 2017-02-14 MED FILL — ATORVASTATIN 20 MG TABLET: 20 | 30 days supply | Qty: 30 | Fill #3

## 2017-02-14 MED FILL — FLUoxetine HCL 20 MG CAPS: 20 | 30 days supply | Qty: 30 | Fill #3

## 2017-05-07 ENCOUNTER — Ambulatory Visit (HOSPITAL_COMMUNITY)
Admission: EM | Admit: 2017-05-07 | Discharge: 2017-05-07 | Disposition: A | Discharge status: Home / Self Care | Payer: Self-pay | Attending: Internal Medicine | Admitting: Internal Medicine

## 2017-05-07 ENCOUNTER — Other Ambulatory Visit: Payer: Self-pay | Admitting: Family Medicine

## 2017-05-07 ENCOUNTER — Encounter (HOSPITAL_COMMUNITY): Payer: Self-pay | Admitting: Emergency Medicine

## 2017-05-07 ENCOUNTER — Telehealth: Payer: Self-pay | Admitting: *Deleted

## 2017-05-07 DIAGNOSIS — R5383 Other fatigue: Secondary | ICD-10-CM

## 2017-05-07 DIAGNOSIS — R5381 Other malaise: Secondary | ICD-10-CM

## 2017-05-07 DIAGNOSIS — R634 Abnormal weight loss: Secondary | ICD-10-CM

## 2017-05-07 DIAGNOSIS — R112 Nausea with vomiting, unspecified: Secondary | ICD-10-CM

## 2017-05-07 DIAGNOSIS — R718 Other abnormality of red blood cells: Secondary | ICD-10-CM

## 2017-05-07 LAB — POCT I-STAT, CHEM 8
BUN: 12 mg/dL (ref 6–20)
Calcium, Ion: 1.15 mmol/L (ref 1.15–1.40)
Chloride: 103 mmol/L (ref 101–111)
Creatinine, Ser: 0.9 mg/dL (ref 0.61–1.24)
Glucose, Bld: 137 mg/dL — ABNORMAL HIGH (ref 65–99)
HEMATOCRIT: 62 % — AB (ref 39.0–52.0)
HEMOGLOBIN: 21.1 g/dL — AB (ref 13.0–17.0)
POTASSIUM: 4.8 mmol/L (ref 3.5–5.1)
SODIUM: 140 mmol/L (ref 135–145)
TCO2: 26 mmol/L (ref 0–100)

## 2017-05-07 LAB — POCT URINALYSIS DIP (DEVICE)
Bilirubin Urine: NEGATIVE
GLUCOSE, UA: NEGATIVE mg/dL
Hgb urine dipstick: NEGATIVE
LEUKOCYTES UA: NEGATIVE
NITRITE: NEGATIVE
PROTEIN: 30 mg/dL — AB
Specific Gravity, Urine: 1.015 (ref 1.005–1.030)
UROBILINOGEN UA: 0.2 mg/dL (ref 0.0–1.0)
pH: 8.5 — ABNORMAL HIGH (ref 5.0–8.0)

## 2017-05-07 NOTE — ED Triage Notes (Signed)
Lost 70 pounds since march 20 with diet.    Now having difficulty eating.  The last 2 days has had no energy.  Nausea and vomiting today.

## 2017-05-07 NOTE — Telephone Encounter (Signed)
Patient verified DOB MA spoke with patient in regards to respectfully handling staff. Patient was assured that the office of [facility] wants to respect and care for him the best way we can.  Patient was advised to refrain from using profanity and "tossing" items at the front desk. Patient expressed his understanding and states he is cycling a lot of different concerns in his mind. Patient was provided an appointment for Wednesday at 2:45pm. No further questions at this time.

## 2017-05-07 NOTE — Discharge Instructions (Signed)
Drive to [facility] and wellness please give him a note on the prescription pad from the urgent care. If they are unable to see you within the next day or 2 then go directly to the emergency department. If you get worse before you are able to make her appointment go to the emergency department promptly.

## 2017-05-07 NOTE — ED Provider Notes (Signed)
CSN: [id number]     Arrival date & time 05/07/17  1105 History   First MD Initiated Contact with Patient 05/07/17 1257     Chief Complaint  Patient presents with  . Emesis   (Consider location/radiation/quality/duration/timing/severity/associated sxs/prior Treatment) 61 year old male resents to the urgent care complaining of lethargy, fatigue, lack of energy for the past 2-3 days. He states that he has been on the starvation diet since March 20. He has eaten very little but states he has been keeping liquids, drinking a lot of water and to sodas daily. He states he does not feel hungry and he has vomited once in the past 2 mornings. He tried either the rice glass about all he could eat. He states these lost 70 pounds. Denies chest pain, shortness of breath, upper respiratory symptoms, abdominal pain, bleeding, fever or chills.      Past Medical History:  Diagnosis Date  . Anxiety   . Depression   . History of prediabetes   . Hyperlipidemia   . Hypertension    Past Surgical History:  Procedure Laterality Date  . CYSTOSCOPY WITH RETROGRADE PYELOGRAM, URETEROSCOPY AND STENT PLACEMENT Right 01/15/2015   Procedure: CYSTOSCOPY, RIGHT URETEROSCOPY/ RETROGRADE PYELOGRAM,HOLMIUM LASER/ LITHOTRIPSY, STENT PLACEMENT, URETERAL BALLOON DIALATION;  Surgeon: Kathie Rhodes, MD;  Location: [facility];  Service: Urology;  Laterality: Right;  . SKIN GRAFT     left hand  . VENTRICULOPERITONEAL SHUNT Right 09/04/2016   Procedure: SHUNT INSERTION VENTRICULAR-PERITONEAL;  Surgeon: Newman Pies, MD;  Location: [facility];  Service: Neurosurgery;  Laterality: Right;  . WISDOM TOOTH EXTRACTION     Family History  Problem Relation Age of Onset  . Hyperlipidemia Father    Social History  Substance Use Topics  . Smoking status: Current Every Day Smoker    Packs/day: 0.50    Years: 28.00    Types: Cigarettes    Last attempt to quit: 10/17/1999  . Smokeless tobacco: Never Used  . Alcohol use 3.0 - 3.6 oz/week   5 - 6 Cans of beer per week     Comment: daily alcohol/drinks beer, wine, and liquor    Review of Systems  Constitutional: Positive for activity change, appetite change and fatigue. Negative for fever.  HENT: Negative.   Respiratory: Negative.   Cardiovascular: Negative.   Gastrointestinal: Positive for vomiting.  Musculoskeletal: Negative.   Skin: Negative.   Neurological: Negative for dizziness, syncope and headaches.  Psychiatric/Behavioral:       Initially the patient was quite agitated and combative when first approached by nurse practitioner Rush Landmark, NP. He did not want to answer any of his questions and wanted to see another RN named specifically. He was not responsive to the initial visit and assumed and offensive posture shortly after he came into the room. He was asked to leave the room. This was only approximately 1 minute after it entered. After that it was decided that D Cadden Elizondo, NP would see him. After some of the staff had spoken to him he apparently calmed down. He was behaved and cooperative when I was examining him.  All other systems reviewed and are negative.   Allergies  Codeine and Lisinopril  Home Medications   Prior to Admission medications   Medication Sig Start Date End Date Taking? Authorizing Provider  amLODipine (NORVASC) 10 MG tablet Take 1 tablet (10 mg total) by mouth daily. 08/21/16  Yes Arnoldo Morale, MD  atorvastatin (LIPITOR) 20 MG tablet Take 1 tablet (20 mg total) by mouth daily. 09/27/16  Yes Arnoldo Morale, MD  FLUoxetine (PROZAC) 20 MG tablet Take 1 tablet (20 mg total) by mouth daily. 09/27/16  Yes Arnoldo Morale, MD  aspirin EC 81 MG tablet Take 81 mg by mouth daily.    [provider]  busPIRone (BUSPAR) 7.5 MG tablet Take 1 tablet (7.5 mg total) by mouth 2 (two) times daily. 08/21/16   Arnoldo Morale, MD  traMADol (ULTRAM) 50 MG tablet Take 1 tablet (50 mg total) by mouth every 8 (eight) hours as needed. Patient taking differently: Take 50 mg  by mouth every 8 (eight) hours as needed for moderate pain.  08/21/16   Arnoldo Morale, MD  valsartan (DIOVAN) 320 MG tablet Take 1 tablet (320 mg total) by mouth daily. 09/27/16   Arnoldo Morale, MD   Meds Ordered and Administered this Visit  Medications - No data to display  BP 133/81 (BP Location: Right Arm)   Pulse (!) 56   Temp 98.5 F (36.9 C) (Oral)   Resp (!) 22   SpO2 97%  No data found.   Physical Exam  Constitutional: He is oriented to person, place, and time. He appears well-developed.  The patient states he has lost 70 pounds in the past 3-4 months or read does not look necessarily cachectic or frail.  HENT:  Head: Normocephalic and atraumatic.  Mouth/Throat: Oropharynx is clear and moist. No oropharyngeal exudate.  Eyes: Pupils are equal, round, and reactive to light. EOM are normal.  Neck: Normal range of motion. Neck supple.  Cardiovascular: Normal rate, regular rhythm, normal heart sounds and intact distal pulses.   Pulmonary/Chest: Effort normal and breath sounds normal. No respiratory distress. He has no wheezes.  Abdominal: Soft. He exhibits no distension and no mass. There is no tenderness. There is no rebound and no guarding.  Musculoskeletal: He exhibits no edema.  Lymphadenopathy:    He has no cervical adenopathy.  Neurological: He is alert and oriented to person, place, and time. No cranial nerve deficit. Coordination normal.  Skin: Skin is warm and dry.  Psychiatric: He has a normal mood and affect.  Nursing note and vitals reviewed.   Urgent Care Course     Procedures (including critical care time)  Labs Review Labs Reviewed  POCT URINALYSIS DIP (DEVICE) - Abnormal; Notable for the following:       Result Value   Ketones, ur TRACE (*)    pH 8.5 (*)    Protein, ur 30 (*)    All other components within normal limits  POCT I-STAT, CHEM 8 - Abnormal; Notable for the following:    Glucose, Bld 137 (*)    Hemoglobin 21.1 (*)    HCT 62.0 (*)    All  other components within normal limits    Imaging Review No results found.   Visual Acuity Review  Right Eye Distance:   Left Eye Distance:   Bilateral Distance:    Right Eye Near:   Left Eye Near:    Bilateral Near:         MDM   1. Elevated hematocrit   2. Loss of weight   3. Malaise and fatigue   4. Non-intractable vomiting with nausea, unspecified vomiting type    Drive to [facility] and wellness please give him a note on the prescription pad from the urgent care. If they are unable to see you within the next day or 2 then go directly to the emergency department. If you get worse before you are able to  make her appointment go to the emergency department promptly. Have attempted to call [facility] as well as [facility]. After 35 minutes no response from either agency.    Janne Napoleon, NP 05/07/17 1502

## 2017-05-09 ENCOUNTER — Ambulatory Visit: Payer: Self-pay | Attending: Family Medicine | Admitting: Family Medicine

## 2017-05-09 ENCOUNTER — Encounter: Payer: Self-pay | Admitting: Family Medicine

## 2017-05-09 VITALS — BP 147/80 | HR 94 | Temp 98.4°F | Ht 71.0 in | Wt 191.6 lb

## 2017-05-09 DIAGNOSIS — R5383 Other fatigue: Secondary | ICD-10-CM

## 2017-05-09 DIAGNOSIS — K009 Disorder of tooth development, unspecified: Secondary | ICD-10-CM

## 2017-05-09 DIAGNOSIS — F329 Major depressive disorder, single episode, unspecified: Secondary | ICD-10-CM | POA: Insufficient documentation

## 2017-05-09 DIAGNOSIS — K089 Disorder of teeth and supporting structures, unspecified: Secondary | ICD-10-CM | POA: Insufficient documentation

## 2017-05-09 DIAGNOSIS — M545 Low back pain: Secondary | ICD-10-CM | POA: Insufficient documentation

## 2017-05-09 DIAGNOSIS — R7303 Prediabetes: Secondary | ICD-10-CM | POA: Insufficient documentation

## 2017-05-09 DIAGNOSIS — Z720 Tobacco use: Secondary | ICD-10-CM

## 2017-05-09 DIAGNOSIS — Z982 Presence of cerebrospinal fluid drainage device: Secondary | ICD-10-CM | POA: Insufficient documentation

## 2017-05-09 DIAGNOSIS — Z7982 Long term (current) use of aspirin: Secondary | ICD-10-CM | POA: Insufficient documentation

## 2017-05-09 DIAGNOSIS — Z87891 Personal history of nicotine dependence: Secondary | ICD-10-CM | POA: Insufficient documentation

## 2017-05-09 DIAGNOSIS — I10 Essential (primary) hypertension: Secondary | ICD-10-CM

## 2017-05-09 DIAGNOSIS — D751 Secondary polycythemia: Secondary | ICD-10-CM

## 2017-05-09 DIAGNOSIS — G8929 Other chronic pain: Secondary | ICD-10-CM | POA: Insufficient documentation

## 2017-05-09 DIAGNOSIS — E7800 Pure hypercholesterolemia, unspecified: Secondary | ICD-10-CM

## 2017-05-09 DIAGNOSIS — F419 Anxiety disorder, unspecified: Secondary | ICD-10-CM

## 2017-05-09 MED ORDER — ATORVASTATIN CALCIUM 20 MG PO TABS: 20.0000 mg | ORAL_TABLET | Freq: Every day | ORAL | 3 refills | Status: DC

## 2017-05-09 MED ORDER — VALSARTAN 320 MG PO TABS: 320.0000 mg | ORAL_TABLET | Freq: Every day | ORAL | 3 refills | Status: DC

## 2017-05-09 MED ORDER — AMLODIPINE BESYLATE 10 MG PO TABS: 10.0000 mg | ORAL_TABLET | Freq: Every day | ORAL | 5 refills | Status: DC

## 2017-05-09 MED ORDER — FLUOXETINE HCL 20 MG PO TABS: 20.0000 mg | ORAL_TABLET | Freq: Every day | ORAL | 3 refills | Status: DC

## 2017-05-09 NOTE — Patient Instructions (Signed)
Polycythemia Vera Polycythemia vera (PV), or myeloproliferative disease, is a form of blood cancer in which the bone marrow makes too many (overproduces) red blood cells. The bone marrow may also make too many clotting cells (platelets) and white blood cells. Bone marrow is the spongy center of bones where blood cells are produced. Sometimes, there may be an overproduction of blood cells in the liver and spleen, causing those organs to become enlarged. Additionally, people who have PV are at a higher risk for stroke or heart attack because their blood may clot more easily. PV is a long-term disease. What are the causes? Almost all people who have PV have an abnormal gene (genetic mutation) that causes changes in the way that the bone marrow makes blood cells. This gene, which is called JAK2, is not passed along from parent to child (is not hereditary). It is not known what triggers the genetic mutation that causes the body to produce too many red blood cells. What increases the risk? This condition is more likely to develop in:  Males.  People who are 10 years of age or older.  What are the signs or symptoms? You may not have any symptoms in the early stage of PV. When symptoms develop, they may include:  Shortness of breath.  Dizziness.  Hot and flushed skin.  Itchy skin.  Sweats, especially night sweats.  Headache.  Tiredness.  Ringing in the ears.  Blurred vision or blind spots.  Bone pain.  Weight loss.  Fever.  Blood-tinged vomit or bowel movements.  How is this diagnosed? This condition may be diagnosed during a routine physical exam if you have a blood test called a complete blood count (CBC). Your health care provider also may suspect PV if you have symptoms. During the physical exam, your provider may find that you have an enlarged liver or spleen. You may also have tests to confirm the diagnosis. These may include:  A procedure to remove a sample of bone marrow  for testing (bone marrow biopsy).  Blood tests to check for: ? The JAK2 gene. ? Low levels of a hormone that helps to regulate blood production (erythropoietin).  How is this treated? There is no cure for PV, but treatment can help to control the disease. There are several types of treatment. No single treatment works for everyone. You will need to work with a blood cancer specialist (hematologist) to find the treatment that is best for you. Options include:  Periodically having some blood removed with a needle (drawn) to lower the number of red blood cells (phlebotomy).  Medicine. Your health care provider may recommend: ? Low-dose aspirin to lower your risk for blood clots. ? A medicine to reduce red blood cell production (hydroxyurea). ? A medicine to lower the number of red blood cells (interferon). ? A medicine that slows down the effects of JAK2 (ruxolitinib).  Follow these instructions at home:  Take over-the-counter and prescription medicines only as told by your health care provider.  Return to your normal activities as told by your health care provider. Ask your health care provider what activities are safe for you.  Do not use tobacco products, including cigarettes, chewing tobacco, or e-cigarettes. If you need help quitting, ask your health care provider.  Keep all follow-up visits as told by your health care provider. This is important. Contact a health care provider if:  You have side effects from your medicines.  Your symptoms change or get worse at home.  You have  blood in your stool or you vomit blood. Get help right away if:  You have sudden and severe pain in your abdomen.  You have chest pain or difficulty breathing.  You have signs of stroke, such as: ? Sudden numbness. ? Weakness of your face or arm. ? Confusion. ? Difficulty speaking or understanding speech. These symptoms may represent a serious problem that is an emergency. Do not wait to see if  the symptoms will go away. Get medical help right away. Call your local emergency services (911 in the [place] Do not drive yourself to the hospital. This information is not intended to replace advice given to you by your health care provider. Make sure you discuss any questions you have with your health care provider. Document Released: 06/27/2001 Document Revised: 03/09/2016 Document Reviewed: 04/14/2015 Elsevier Interactive Patient Education  [employer].

## 2017-05-09 NOTE — Progress Notes (Signed)
Subjective:  Patient ID: Luis Henderson, male    DOB: Oct 01, 1956  Age: 61 y.o. MRN: [id number]  CC: Fatigue  HPI Luis Henderson  is a 61 year old male with a history of hypertension, anxiety, chronic low back pain who presents today after being seen at urgent care 2 days ago and found to have polycythemia with hematocrit of 62.0 (up from 46.7, eight months ago. Complete blood count was not done. He informs me he was recommended that he see oncology.  The patient informs me he has had fatigue for the last 1 week and has been vomiting for the last 3 days. He has been on a starvation diet with very little intake in an attempt to lose weight with a goal of 155 pounds. Review of his chart indicates he has lost 49 pounds in the last 7 months (given that the patient tells me he has lost 70 pounds). Denies abdominal pain, diarrhea or constipation.  His blood pressure is elevated and he endorses taking only his amlodipine but has not been taking valsartan which appears on his med list. He does have a history of smoking   Would also like a dental referral as the crown of his tooth has fallen off.  Past Medical History:  Diagnosis Date  . Anxiety   . Depression   . History of prediabetes   . Hyperlipidemia   . Hypertension     Past Surgical History:  Procedure Laterality Date  . CYSTOSCOPY WITH RETROGRADE PYELOGRAM, URETEROSCOPY AND STENT PLACEMENT Right 01/15/2015   Procedure: CYSTOSCOPY, RIGHT URETEROSCOPY/ RETROGRADE PYELOGRAM,HOLMIUM LASER/ LITHOTRIPSY, STENT PLACEMENT, URETERAL BALLOON DIALATION;  Surgeon: Kathie Rhodes, MD;  Location: [facility];  Service: Urology;  Laterality: Right;  . SKIN GRAFT     left hand  . VENTRICULOPERITONEAL SHUNT Right 09/04/2016   Procedure: SHUNT INSERTION VENTRICULAR-PERITONEAL;  Surgeon: Newman Pies, MD;  Location: [facility];  Service: Neurosurgery;  Laterality: Right;  . WISDOM TOOTH EXTRACTION      Allergies  Allergen Reactions  . Codeine  Itching  . Lisinopril Cough     Outpatient Medications Prior to Visit  Medication Sig Dispense Refill  . aspirin EC 81 MG tablet Take 81 mg by mouth daily.    Marland Kitchen amLODipine (NORVASC) 10 MG tablet Take 1 tablet (10 mg total) by mouth daily. 30 tablet 5  . atorvastatin (LIPITOR) 20 MG tablet Take 1 tablet (20 mg total) by mouth daily. 30 tablet 3  . FLUoxetine (PROZAC) 20 MG tablet Take 1 tablet (20 mg total) by mouth daily. 30 tablet 3  . valsartan (DIOVAN) 320 MG tablet Take 1 tablet (320 mg total) by mouth daily. 30 tablet 3  . traMADol (ULTRAM) 50 MG tablet Take 1 tablet (50 mg total) by mouth every 8 (eight) hours as needed. (Patient not taking: Reported on 05/09/2017) 60 tablet 1  . busPIRone (BUSPAR) 7.5 MG tablet Take 1 tablet (7.5 mg total) by mouth 2 (two) times daily. (Patient not taking: Reported on 05/09/2017) 60 tablet 5   No facility-administered medications prior to visit.     ROS Review of Systems  Constitutional: Positive for fatigue and unexpected weight change. Negative for activity change and appetite change.  HENT: Negative for sinus pressure and sore throat.   Eyes: Negative for visual disturbance.  Respiratory: Negative for cough, chest tightness and shortness of breath.   Cardiovascular: Negative for chest pain and leg swelling.  Gastrointestinal: Positive for vomiting. Negative for abdominal distention, abdominal pain, constipation and diarrhea.  Endocrine: Negative.   Genitourinary: Negative for dysuria.  Musculoskeletal: Negative for joint swelling and myalgias.  Skin: Negative for rash.  Allergic/Immunologic: Negative.   Neurological: Negative for weakness, light-headedness and numbness.  Psychiatric/Behavioral: Negative for dysphoric mood and suicidal ideas.    Objective:  BP (!) 147/80   Pulse 94   Temp 98.4 F (36.9 C) (Oral)   Ht 5\' 11"  (1.803 m)   Wt 191 lb 9.6 oz (86.9 kg)   SpO2 98%   BMI 26.72 kg/m   BP/Weight 05/09/2017 05/07/2017  78/93/8101  Systolic BP 751 025 852  Diastolic BP 80 81 75  Wt. (Lbs) 191.6 - 240  BMI 26.72 - 33.47      Physical Exam  Constitutional: He is oriented to person, place, and time. He appears well-developed and well-nourished.  Cardiovascular: Normal rate, normal heart sounds and intact distal pulses.   No murmur heard. Pulmonary/Chest: Effort normal and breath sounds normal. He has no wheezes. He has no rales. He exhibits no tenderness.  Abdominal: Soft. Bowel sounds are normal. He exhibits no distension and no mass. There is no tenderness.  Musculoskeletal: Normal range of motion.  Neurological: He is alert and oriented to person, place, and time.  Skin: Skin is warm and dry.  Psychiatric:  Irritable     CMP Latest Ref Rng & Units 05/07/2017 09/05/2016 09/04/2016  Glucose 65 - 99 mg/dL 137(H) 110(H) 122(H)  BUN 6 - 20 mg/dL 12 10 15   Creatinine 0.61 - 1.24 mg/dL 0.90 1.09 1.13  Sodium 135 - 145 mmol/L 140 137 138  Potassium 3.5 - 5.1 mmol/L 4.8 3.6 3.6  Chloride 101 - 111 mmol/L 103 104 106  CO2 22 - 32 mmol/L - 24 24  Calcium 8.9 - 10.3 mg/dL - 8.8(L) 8.9  Total Protein 6.5 - 8.1 g/dL - - 5.7(L)  Total Bilirubin 0.3 - 1.2 mg/dL - - 1.0  Alkaline Phos 38 - 126 U/L - - 71  AST 15 - 41 U/L - - 26  ALT 17 - 63 U/L - - 31    CBC Latest Ref Rng & Units 05/07/2017 09/05/2016 09/04/2016  WBC 4.0 - 10.5 K/uL - 8.8 7.8  Hemoglobin 13.0 - 17.0 g/dL 21.1(HH) 15.5 15.8  Hematocrit 39.0 - 52.0 % 62.0(H) 46.7 47.3  Platelets 150 - 400 K/uL - 246 226      Assessment & Plan:   1. Essential hyperteUncontrolled Low-sodium diet Emphasized compliance He expresses the fact that he has no money to pick up his medications Encouraged to speak with the pharmacy staff will work with him - valsartan (DIOVAN) 320 MG tablet; Take 1 tablet (320 mg total) by mouth daily.  Dispense: 30 tablet; Refill: 3 - amLODipine (NORVASC) 10 MG tablet; Take 1 tablet (10 mg total) by mouth daily.  Dispense:  30 tablet; Refill: 5  2. Anxiety  Currently on Prozac   3. Polycythemia  CBC and refer to Hematology if Polycythemic for phlebotomy - CBC with Differential/Platelet  4. Tobacco abuse Advised on smoking cessation as this could be contributory  5. Other fatigue - T4, free - TSH - T3, Free - Vitamin D, 25-hydroxy  6. Pure hypercholesterolemia - atorvastatin (LIPITOR) 20 MG tablet; Take 1 tablet (20 mg total) by mouth daily.  Dispense: 30 tablet; Refill: 3  7. Dental anomaly - Ambulatory referral to Dentistry   Meds ordered this encounter  Medications  . valsartan (DIOVAN) 320 MG tablet    Sig: Take 1 tablet (320 mg total)  by mouth daily.    Dispense:  30 tablet    Refill:  3    Discontinue previous dose  . FLUoxetine (PROZAC) 20 MG tablet    Sig: Take 1 tablet (20 mg total) by mouth daily.    Dispense:  30 tablet    Refill:  3  . atorvastatin (LIPITOR) 20 MG tablet    Sig: Take 1 tablet (20 mg total) by mouth daily.    Dispense:  30 tablet    Refill:  3  . amLODipine (NORVASC) 10 MG tablet    Sig: Take 1 tablet (10 mg total) by mouth daily.    Dispense:  30 tablet    Refill:  5    Follow-up: Return in about 3 weeks (around 05/30/2017) for Follow-up on polycythemia and fatigue.   Arnoldo Morale MD

## 2017-05-10 LAB — T3, FREE: T3, Free: 3.4 pg/mL (ref 2.0–4.4)

## 2017-05-10 LAB — CBC WITH DIFFERENTIAL/PLATELET
Basophils Absolute: 0.1 10*3/uL (ref 0.0–0.2)
Basos: 1 %
EOS (ABSOLUTE): 0.3 10*3/uL (ref 0.0–0.4)
EOS: 4 %
HEMATOCRIT: 54.9 % — AB (ref 37.5–51.0)
Hemoglobin: 18.8 g/dL — ABNORMAL HIGH (ref 13.0–17.7)
IMMATURE GRANULOCYTES: 1 %
Immature Grans (Abs): 0 10*3/uL (ref 0.0–0.1)
LYMPHS ABS: 2.5 10*3/uL (ref 0.7–3.1)
LYMPHS: 28 %
MCH: 29.7 pg (ref 26.6–33.0)
MCHC: 34.2 g/dL (ref 31.5–35.7)
MCV: 87 fL (ref 79–97)
MONOCYTES: 14 %
Monocytes Absolute: 1.2 10*3/uL — ABNORMAL HIGH (ref 0.1–0.9)
NEUTROS PCT: 52 %
Neutrophils Absolute: 4.6 10*3/uL (ref 1.4–7.0)
Platelets: 281 10*3/uL (ref 150–379)
RBC: 6.33 x10E6/uL — ABNORMAL HIGH (ref 4.14–5.80)
RDW: 15.3 % (ref 12.3–15.4)
WBC: 8.6 10*3/uL (ref 3.4–10.8)

## 2017-05-10 LAB — VITAMIN D 25 HYDROXY (VIT D DEFICIENCY, FRACTURES): VIT D 25 HYDROXY: 50.1 ng/mL (ref 30.0–100.0)

## 2017-05-10 LAB — TSH: TSH: 1.51 u[IU]/mL (ref 0.450–4.500)

## 2017-05-10 LAB — T4, FREE: Free T4: 1.44 ng/dL (ref 0.82–1.77)

## 2017-05-31 ENCOUNTER — Emergency Department (HOSPITAL_COMMUNITY): Payer: Self-pay

## 2017-05-31 ENCOUNTER — Encounter (HOSPITAL_COMMUNITY): Payer: Self-pay

## 2017-05-31 ENCOUNTER — Other Ambulatory Visit: Payer: Self-pay

## 2017-05-31 ENCOUNTER — Inpatient Hospital Stay (HOSPITAL_COMMUNITY)
Admission: EM | Admit: 2017-05-31 | Discharge: 2017-06-02 | DRG: 392 | Disposition: A | Discharge status: Home / Self Care | Payer: Self-pay | Attending: Family Medicine | Admitting: Family Medicine

## 2017-05-31 DIAGNOSIS — F329 Major depressive disorder, single episode, unspecified: Secondary | ICD-10-CM | POA: Diagnosis present

## 2017-05-31 DIAGNOSIS — Z79899 Other long term (current) drug therapy: Secondary | ICD-10-CM

## 2017-05-31 DIAGNOSIS — R112 Nausea with vomiting, unspecified: Principal | ICD-10-CM | POA: Diagnosis present

## 2017-05-31 DIAGNOSIS — I160 Hypertensive urgency: Secondary | ICD-10-CM | POA: Diagnosis present

## 2017-05-31 DIAGNOSIS — F32A Depression, unspecified: Secondary | ICD-10-CM | POA: Diagnosis present

## 2017-05-31 DIAGNOSIS — I10 Essential (primary) hypertension: Secondary | ICD-10-CM | POA: Diagnosis present

## 2017-05-31 DIAGNOSIS — F419 Anxiety disorder, unspecified: Secondary | ICD-10-CM | POA: Diagnosis present

## 2017-05-31 DIAGNOSIS — Z7982 Long term (current) use of aspirin: Secondary | ICD-10-CM

## 2017-05-31 DIAGNOSIS — R1011 Right upper quadrant pain: Secondary | ICD-10-CM

## 2017-05-31 DIAGNOSIS — G911 Obstructive hydrocephalus: Secondary | ICD-10-CM | POA: Diagnosis present

## 2017-05-31 DIAGNOSIS — R7303 Prediabetes: Secondary | ICD-10-CM | POA: Diagnosis present

## 2017-05-31 DIAGNOSIS — D751 Secondary polycythemia: Secondary | ICD-10-CM | POA: Diagnosis present

## 2017-05-31 DIAGNOSIS — E861 Hypovolemia: Secondary | ICD-10-CM | POA: Diagnosis present

## 2017-05-31 DIAGNOSIS — Z982 Presence of cerebrospinal fluid drainage device: Secondary | ICD-10-CM

## 2017-05-31 DIAGNOSIS — F17210 Nicotine dependence, cigarettes, uncomplicated: Secondary | ICD-10-CM | POA: Diagnosis present

## 2017-05-31 LAB — COMPREHENSIVE METABOLIC PANEL
ALBUMIN: 4.3 g/dL (ref 3.5–5.0)
ALT: 17 U/L (ref 17–63)
AST: 27 U/L (ref 15–41)
Alkaline Phosphatase: 82 U/L (ref 38–126)
Anion gap: 14 (ref 5–15)
BUN: 8 mg/dL (ref 6–20)
CHLORIDE: 105 mmol/L (ref 101–111)
CO2: 20 mmol/L — AB (ref 22–32)
CREATININE: 0.96 mg/dL (ref 0.61–1.24)
Calcium: 9.4 mg/dL (ref 8.9–10.3)
GFR calc Af Amer: >60 mL/min (ref >60)
GLUCOSE: 148 mg/dL — AB (ref 65–99)
POTASSIUM: 4.5 mmol/L (ref 3.5–5.1)
Sodium: 139 mmol/L (ref 135–145)
Total Bilirubin: 1.5 mg/dL — ABNORMAL HIGH (ref 0.3–1.2)
Total Protein: 6.7 g/dL (ref 6.5–8.1)

## 2017-05-31 LAB — CBC WITH DIFFERENTIAL/PLATELET
BASOS ABS: 0.1 10*3/uL (ref 0.0–0.1)
BASOS PCT: 1 %
EOS PCT: 1 %
Eosinophils Absolute: 0.1 10*3/uL (ref 0.0–0.7)
HCT: 55.9 % — ABNORMAL HIGH (ref 39.0–52.0)
Hemoglobin: 19.5 g/dL — ABNORMAL HIGH (ref 13.0–17.0)
LYMPHS ABS: 1.2 10*3/uL (ref 0.7–4.0)
Lymphocytes Relative: 11 %
MCH: 30.2 pg (ref 26.0–34.0)
MCHC: 34.9 g/dL (ref 30.0–36.0)
MCV: 86.7 fL (ref 78.0–100.0)
MONO ABS: 0.6 10*3/uL (ref 0.1–1.0)
MONOS PCT: 5 %
NEUTROS PCT: 83 %
Neutro Abs: 8.9 10*3/uL — ABNORMAL HIGH (ref 1.7–7.7)
PLATELETS: 313 10*3/uL (ref 150–400)
RBC: 6.45 MIL/uL — AB (ref 4.22–5.81)
RDW: 15.2 % (ref 11.5–15.5)
WBC: 10.8 10*3/uL — ABNORMAL HIGH (ref 4.0–10.5)

## 2017-05-31 LAB — LIPASE, BLOOD: Lipase: 44 U/L (ref 11–51)

## 2017-05-31 LAB — URINALYSIS, ROUTINE W REFLEX MICROSCOPIC
BILIRUBIN URINE: NEGATIVE
Glucose, UA: 50 mg/dL — AB
Hgb urine dipstick: NEGATIVE
KETONES UR: NEGATIVE mg/dL
LEUKOCYTES UA: NEGATIVE
NITRITE: NEGATIVE
PH: 8 (ref 5.0–8.0)
PROTEIN: 30 mg/dL — AB
SQUAMOUS EPITHELIAL / LPF: NONE SEEN
Specific Gravity, Urine: 1.01 (ref 1.005–1.030)

## 2017-05-31 LAB — I-STAT CG4 LACTIC ACID, ED
LACTIC ACID, VENOUS: 1.9 mmol/L (ref 0.5–1.9)
Lactic Acid, Venous: 3.03 mmol/L (ref 0.5–1.9)

## 2017-05-31 MED ORDER — IRBESARTAN 300 MG PO TABS
300.0000 mg | ORAL_TABLET | Freq: Every day | ORAL | Status: DC
  Administered 2017-05-31 – 2017-06-02 (×3): 300 mg via ORAL
  Filled 2017-05-31 (×3): qty 1

## 2017-05-31 MED ORDER — ONDANSETRON HCL 4 MG/2ML IJ SOLN: 4.0000 mg | Freq: Four times a day (QID) | INTRAMUSCULAR | Status: DC | PRN

## 2017-05-31 MED ORDER — ASPIRIN EC 81 MG PO TBEC
81.0000 mg | DELAYED_RELEASE_TABLET | Freq: Every day | ORAL | Status: DC
  Administered 2017-06-01 – 2017-06-02 (×2): 81 mg via ORAL
  Filled 2017-05-31 (×2): qty 1

## 2017-05-31 MED ORDER — BISACODYL 5 MG PO TBEC: 5.0000 mg | DELAYED_RELEASE_TABLET | Freq: Every day | ORAL | Status: DC | PRN

## 2017-05-31 MED ORDER — ATORVASTATIN CALCIUM 20 MG PO TABS
20.0000 mg | ORAL_TABLET | Freq: Every day | ORAL | Status: DC
  Administered 2017-06-01: 20 mg via ORAL
  Filled 2017-05-31: qty 1

## 2017-05-31 MED ORDER — ORAL CARE MOUTH RINSE
15.0000 mL | Freq: Two times a day (BID) | OROMUCOSAL | Status: DC
  Administered 2017-06-01 – 2017-06-02 (×4): 15 mL via OROMUCOSAL

## 2017-05-31 MED ORDER — AMLODIPINE BESYLATE 10 MG PO TABS
10.0000 mg | ORAL_TABLET | Freq: Every day | ORAL | Status: DC
  Administered 2017-05-31 – 2017-06-02 (×3): 10 mg via ORAL
  Filled 2017-05-31: qty 2
  Filled 2017-05-31 (×2): qty 1

## 2017-05-31 MED ORDER — SENNOSIDES-DOCUSATE SODIUM 8.6-50 MG PO TABS: 1.0000 | ORAL_TABLET | Freq: Every evening | ORAL | Status: DC | PRN

## 2017-05-31 MED ORDER — ACETAMINOPHEN 325 MG PO TABS
650.0000 mg | ORAL_TABLET | Freq: Four times a day (QID) | ORAL | Status: DC | PRN
  Administered 2017-06-01 – 2017-06-02 (×2): 650 mg via ORAL
  Filled 2017-05-31 (×2): qty 2

## 2017-05-31 MED ORDER — HYDRALAZINE HCL 20 MG/ML IJ SOLN: 10.0000 mg | INTRAMUSCULAR | Status: DC | PRN

## 2017-05-31 MED ORDER — ONDANSETRON HCL 4 MG/2ML IJ SOLN
4.0000 mg | Freq: Once | INTRAMUSCULAR | Status: AC
  Administered 2017-05-31: 4 mg via INTRAVENOUS
  Filled 2017-05-31: qty 2

## 2017-05-31 MED ORDER — LORAZEPAM 2 MG/ML IJ SOLN
1.0000 mg | Freq: Once | INTRAMUSCULAR | Status: AC
  Administered 2017-05-31: 1 mg via INTRAVENOUS
  Filled 2017-05-31: qty 1

## 2017-05-31 MED ORDER — ONDANSETRON HCL 4 MG PO TABS: 4.0000 mg | ORAL_TABLET | Freq: Four times a day (QID) | ORAL | Status: DC | PRN

## 2017-05-31 MED ORDER — FLUOXETINE HCL 20 MG PO CAPS
20.0000 mg | ORAL_CAPSULE | Freq: Every day | ORAL | Status: DC
  Administered 2017-06-01 – 2017-06-02 (×2): 20 mg via ORAL
  Filled 2017-05-31 (×2): qty 1

## 2017-05-31 MED ORDER — ACETAMINOPHEN 650 MG RE SUPP: 650.0000 mg | Freq: Four times a day (QID) | RECTAL | Status: DC | PRN

## 2017-05-31 MED ORDER — SODIUM CHLORIDE 0.9 % IV BOLUS (SEPSIS)
1000.0000 mL | Freq: Once | INTRAVENOUS | Status: AC
  Administered 2017-05-31: 1000 mL via INTRAVENOUS

## 2017-05-31 MED ORDER — SODIUM CHLORIDE 0.9 % IV SOLN
INTRAVENOUS | Status: AC
  Administered 2017-06-01: via INTRAVENOUS

## 2017-05-31 MED ORDER — HYDROCODONE-ACETAMINOPHEN 5-325 MG PO TABS: 1.0000 | ORAL_TABLET | ORAL | Status: DC | PRN

## 2017-05-31 MED ORDER — ENOXAPARIN SODIUM 40 MG/0.4ML ~~LOC~~ SOLN
40.0000 mg | Freq: Every day | SUBCUTANEOUS | Status: DC
  Administered 2017-06-01 – 2017-06-02 (×2): 40 mg via SUBCUTANEOUS
  Filled 2017-05-31 (×2): qty 0.4

## 2017-05-31 NOTE — ED Provider Notes (Signed)
[facility] DEPT Provider Note   CSN: [id number] Arrival date & time: 05/31/17  1253     History   Chief Complaint Chief Complaint  Patient presents with  . Abdominal Pain  . Nausea    HPI MATSON WELCH is a 61 y.o. 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 He reports cramping, non-radiating RUQ abdominal pain that began when he awoke this morning with associated nausea and non-bilious, nonbloody emesis. He denies diarrhea, chest pain, hematemesis, melena, hematochezia, headache, weakness, numbness, syncope, or visual changes.   He was recently evaluated by [facility] on 7/25 for polycythemia with a hematocrit of 62.0 (up from 46.7, eight months prior), N/V x3 days, fatigue, and weight loss of 49 lbs over the last 7 months after being on a starvation diet with very little intake in an attempt to lose weight.   A review of his medical record indicates a right VP shunt that was placed in Nov. 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   The history is provided by the patient. No language interpreter was used.    Past Medical History:  Diagnosis Date  . Anxiety   . Depression   . History of prediabetes   . Hyperlipidemia   . Hypertension     Patient Active Problem List   Diagnosis Date Noted  . Hypertensive urgency 05/31/2017  . Nausea and vomiting 05/31/2017  . Polycythemia 05/31/2017  . Nausea & vomiting 05/31/2017  . 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 Falls   . Anxiety and depression   . Chronic bilateral low back pain without sciatica   . ETOH abuse   . Tobacco abuse   . HLD (hyperlipidemia) 09/04/2016  . 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 Abnormal gait 09/03/2016  . Acute encephalopathy 09/03/2016    . Low back pain 06/09/2016  . HTN (hypertension) 04/01/2015    Past Surgical History:  Procedure Laterality Date  . CYSTOSCOPY WITH RETROGRADE PYELOGRAM, URETEROSCOPY AND STENT PLACEMENT Right 01/15/2015   Procedure: CYSTOSCOPY, RIGHT URETEROSCOPY/ RETROGRADE PYELOGRAM,HOLMIUM LASER/ LITHOTRIPSY, STENT PLACEMENT, URETERAL BALLOON DIALATION;  Surgeon: Kathie Rhodes, MD;  Location: [facility];  Service: Urology;  Laterality: Right;  . SKIN GRAFT     left hand  . VENTRICULOPERITONEAL SHUNT Right 09/04/2016   Procedure: SHUNT INSERTION VENTRICULAR-PERITONEAL;  Surgeon: Newman Pies, MD;  Location: [facility];  Service: Neurosurgery;  Laterality: Right;  . WISDOM TOOTH EXTRACTION         Home Medications    Prior to Admission medications   Medication Sig Start Date End Date Taking? Authorizing Provider  amLODipine (NORVASC) 10 MG tablet Take 1 tablet (10 mg total) by mouth daily. 05/09/17  Yes Arnoldo Morale, MD  aspirin EC 81 MG tablet Take 81 mg by mouth daily.   Yes [provider]  atorvastatin (LIPITOR) 20 MG tablet Take 1 tablet (20 mg total) by mouth daily. 05/09/17  Yes Arnoldo Morale, MD  FLUoxetine (PROZAC) 20 MG tablet Take 1 tablet (20 mg total) by mouth daily. 05/09/17  Yes Arnoldo Morale, MD  valsartan (DIOVAN) 320 MG tablet Take 1 tablet (320 mg total) by mouth daily. 05/09/17  Yes Arnoldo Morale, MD  traMADol (ULTRAM) 50 MG tablet Take 1 tablet (50 mg total) by mouth every 8 (eight) hours as needed. Patient not taking:  Reported on 05/09/2017 08/21/16   Arnoldo Morale, MD    Family History Family History  Problem Relation Age of Onset  . Hyperlipidemia Father     Social History Social History  Substance Use Topics  . Smoking status: Current Every Day Smoker    Packs/day: 0.50    Years: 28.00    Types: Cigarettes    Last attempt to quit: 10/17/1999  . Smokeless tobacco: Never Used  . Alcohol use 3.0 - 3.6 oz/week    5 - 6 Cans of beer per week     Comment: daily  alcohol/drinks beer, wine, and liquor     Allergies   Codeine and Lisinopril   Review of Systems Review of Systems  Constitutional: Positive for chills and fever.  Respiratory: Negative for shortness of breath.   Cardiovascular: Negative for chest pain.  Gastrointestinal: Positive for abdominal pain, nausea and vomiting.  Neurological: Negative for syncope, weakness, numbness and headaches.   Physical Exam Updated Vital Signs BP (!) 141/78   Pulse 65   Temp 98.5 F (36.9 C) (Oral)   Resp (!) 21   SpO2 94%   Physical Exam  Constitutional: He appears well-developed.  Actively vomiting and retching  HENT:  Head: Normocephalic.  Eyes: Pupils are equal, round, and reactive to light. Conjunctivae are normal.  Neck: Normal range of motion. Neck supple.  Cardiovascular: Normal rate, regular rhythm, normal heart sounds and intact distal pulses.  Exam reveals no gallop and no friction rub.   No murmur heard. Pulmonary/Chest: Effort normal and breath sounds normal. No respiratory distress. He has no wheezes. He has no rales. He exhibits no tenderness.  Abdominal: Soft. Bowel sounds are normal. He exhibits no distension and no mass. There is tenderness. There is no rebound and no guarding. No hernia.  Moderate TPP over the RUQ. Negative Murphy sign. Mild TTP over the RLQ, LLQ, and LUQ. No CVA tenderness bilaterally.   Musculoskeletal: Normal range of motion. He exhibits no edema or deformity.  Neurological: He is alert.  Cranial nerves 2-12 intact. Finger-to-nose is normal. 5/5 motor strength of the bilateral upper and lower extremities. Moves all four extremities. Negative Romberg. Ambulatory without difficulty. NVI.    Skin: Skin is warm and dry.  Psychiatric: His behavior is normal.  Nursing note and vitals reviewed.   ED Treatments / Results  Labs (all labs ordered are listed, but only abnormal results are displayed) Labs Reviewed  CBC WITH DIFFERENTIAL/PLATELET - Abnormal;  Notable for the following:       Result Value   WBC 10.8 (*)    RBC 6.45 (*)    Hemoglobin 19.5 (*)    HCT 55.9 (*)    Neutro Abs 8.9 (*)    All other components within normal limits  COMPREHENSIVE METABOLIC PANEL - Abnormal; Notable for the following:    CO2 20 (*)    Glucose, Bld 148 (*)    Total Bilirubin 1.5 (*)    All other components within normal limits  URINALYSIS, ROUTINE W REFLEX MICROSCOPIC - Abnormal; Notable for the following:    Color, Urine STRAW (*)    Glucose, UA 50 (*)    Protein, ur 30 (*)    Bacteria, UA RARE (*)    All other components within normal limits  I-STAT CG4 LACTIC ACID, ED - Abnormal; Notable for the following:    Lactic Acid, Venous 3.03 (*)    All other components within normal limits  LIPASE, BLOOD  HIV ANTIBODY (ROUTINE  TESTING)  COMPREHENSIVE METABOLIC PANEL  CBC WITH DIFFERENTIAL/PLATELET  I-STAT CG4 LACTIC ACID, ED    EKG  EKG Interpretation None       Radiology Dg Skull 1-3 Views  Result Date: 05/31/2017 CLINICAL DATA:  61 y/o M; shunt series. Patient complains of nausea and dizziness. EXAM: SKULL - 1-3 VIEW; CHEST 1 VIEW; ABDOMEN - 1 VIEW; DG CERVICAL SPINE - 1 VIEW COMPARISON:  05/26/2017 CT head FINDINGS: Skull radiographs: Right parietal approach ventriculostomy catheter. The catheter tip crosses the midline. There is a reservoir in the right lateral convexity scalp. The catheter courses over the right lateral skull to the right anterior neck. No kink or discontinuity is identified along the visualized course of the catheter. Cervical spine radiograph: The catheter courses along the right anterior aspect of the neck. No kink or discontinuity is identified along the course of the catheter. Chest radiograph: The catheter courses along the right anterior chest wall. No kink or discontinuity is identified along the course of the catheter. Normal cardiac silhouette. Calcific aortic atherosclerosis. Clear lungs. Abdomen radiograph: The  catheter coils in the upper abdomen, visualized portions of the catheter do not demonstrate a kink or discontinuity. 8 mm stone within the right kidney hilum.  Normal bowel gas pattern. IMPRESSION: Right parietal approach ventriculostomy catheter. The catheter extends along the right lateral skull, right anterior neck, right anterior chest, and coils in the upper abdomen. No kink or discontinuity of the catheter is identified. Electronically Signed   By: Kristine Garbe M.D.   On: 05/31/2017 18:55   Dg Chest 1 View  Result Date: 05/31/2017 CLINICAL DATA:  61 y/o M; shunt series. Patient complains of nausea and dizziness. EXAM: SKULL - 1-3 VIEW; CHEST 1 VIEW; ABDOMEN - 1 VIEW; DG CERVICAL SPINE - 1 VIEW COMPARISON:  05/26/2017 CT head FINDINGS: Skull radiographs: Right parietal approach ventriculostomy catheter. The catheter tip crosses the midline. There is a reservoir in the right lateral convexity scalp. The catheter courses over the right lateral skull to the right anterior neck. No kink or discontinuity is identified along the visualized course of the catheter. Cervical spine radiograph: The catheter courses along the right anterior aspect of the neck. No kink or discontinuity is identified along the course of the catheter. Chest radiograph: The catheter courses along the right anterior chest wall. No kink or discontinuity is identified along the course of the catheter. Normal cardiac silhouette. Calcific aortic atherosclerosis. Clear lungs. Abdomen radiograph: The catheter coils in the upper abdomen, visualized portions of the catheter do not demonstrate a kink or discontinuity. 8 mm stone within the right kidney hilum.  Normal bowel gas pattern. IMPRESSION: Right parietal approach ventriculostomy catheter. The catheter extends along the right lateral skull, right anterior neck, right anterior chest, and coils in the upper abdomen. No kink or discontinuity of the catheter is identified.  Electronically Signed   By: Kristine Garbe M.D.   On: 05/31/2017 18:55   Dg Cervical Spine 1 View  Result Date: 05/31/2017 CLINICAL DATA:  61 y/o M; shunt series. Patient complains of nausea and dizziness. EXAM: SKULL - 1-3 VIEW; CHEST 1 VIEW; ABDOMEN - 1 VIEW; DG CERVICAL SPINE - 1 VIEW COMPARISON:  05/26/2017 CT head FINDINGS: Skull radiographs: Right parietal approach ventriculostomy catheter. The catheter tip crosses the midline. There is a reservoir in the right lateral convexity scalp. The catheter courses over the right lateral skull to the right anterior neck. No kink or discontinuity is identified along the visualized course of  the catheter. Cervical spine radiograph: The catheter courses along the right anterior aspect of the neck. No kink or discontinuity is identified along the course of the catheter. Chest radiograph: The catheter courses along the right anterior chest wall. No kink or discontinuity is identified along the course of the catheter. Normal cardiac silhouette. Calcific aortic atherosclerosis. Clear lungs. Abdomen radiograph: The catheter coils in the upper abdomen, visualized portions of the catheter do not demonstrate a kink or discontinuity. 8 mm stone within the right kidney hilum.  Normal bowel gas pattern. IMPRESSION: Right parietal approach ventriculostomy catheter. The catheter extends along the right lateral skull, right anterior neck, right anterior chest, and coils in the upper abdomen. No kink or discontinuity of the catheter is identified. Electronically Signed   By: Kristine Garbe M.D.   On: 05/31/2017 18:55   Dg Abdomen 1 View  Result Date: 05/31/2017 CLINICAL DATA:  61 y/o M; shunt series. Patient complains of nausea and dizziness. EXAM: SKULL - 1-3 VIEW; CHEST 1 VIEW; ABDOMEN - 1 VIEW; DG CERVICAL SPINE - 1 VIEW COMPARISON:  05/26/2017 CT head FINDINGS: Skull radiographs: Right parietal approach ventriculostomy catheter. The catheter tip crosses  the midline. There is a reservoir in the right lateral convexity scalp. The catheter courses over the right lateral skull to the right anterior neck. No kink or discontinuity is identified along the visualized course of the catheter. Cervical spine radiograph: The catheter courses along the right anterior aspect of the neck. No kink or discontinuity is identified along the course of the catheter. Chest radiograph: The catheter courses along the right anterior chest wall. No kink or discontinuity is identified along the course of the catheter. Normal cardiac silhouette. Calcific aortic atherosclerosis. Clear lungs. Abdomen radiograph: The catheter coils in the upper abdomen, visualized portions of the catheter do not demonstrate a kink or discontinuity. 8 mm stone within the right kidney hilum.  Normal bowel gas pattern. IMPRESSION: Right parietal approach ventriculostomy catheter. The catheter extends along the right lateral skull, right anterior neck, right anterior chest, and coils in the upper abdomen. No kink or discontinuity of the catheter is identified. Electronically Signed   By: Kristine Garbe M.D.   On: 05/31/2017 18:55   Ct Head Wo Contrast  Result Date: 05/31/2017 CLINICAL DATA:  Dizziness, nausea, shunt placed in November 2017, symptoms since, history hypertension EXAM: CT HEAD WITHOUT CONTRAST TECHNIQUE: Contiguous axial images were obtained from the base of the skull through the vertex without intravenous contrast. COMPARISON:  09/05/2016 FINDINGS: Brain: Intraventricular shunt with tip at the LEFT lateral ventricle posteriorly. CSF attenuation structure 2.3 x 2.1 x 1.9 cm at the aqueductal region remains dilated, previously measured 2.2 x 2.2 x 2.0 cm, with dorsal mass effect upon the pons RIGHT of midline. Ventricles otherwise normal morphology. No midline shift or additional mass effect. Otherwise normal appearance of brain parenchyma. No intracranial hemorrhage, mass lesion or  evidence acute infarction. No extra-axial fluid collections. Vascular: Atherosclerotic calcifications of the internal carotid arteries bilaterally at the skullbase. Skull: Intact Sinuses/Orbits: Clear Other: N/A IMPRESSION: CSF attenuation structure at the aqueductal region appears grossly stable since the previous exam likely representing a cyst which had previously caused ventricular obstruction. No evidence of hydrocephalus post intraventricular shunt placement. No new/acute intracranial abnormalities. Electronically Signed   By: Lavonia Dana M.D.   On: 05/31/2017 16:52   US Abdomen Complete  Result Date: 05/31/2017 CLINICAL DATA:  61 y/o M; right upper quadrant abdominal pain for 1 day. History of  kidney stones. EXAM: ABDOMEN ULTRASOUND COMPLETE COMPARISON:  01/13/2015 CT abdomen and pelvis. 01/11/2015 abdomen ultrasound. FINDINGS: Gallbladder: 9.2 mm gallbladder wall thickening. Echogenic material within the lumen of the gallbladder a fixed of the wall may represent tumefactive sludge or a polyp measuring up to 1.5 cm. No pericholecystic fluid. Negative sonographic Murphy sign. Common bile duct: Diameter: 4.1 mm Liver: Mild heterogeneous liver echotexture with overall increased echogenicity compatible with steatosis. No focal lesion identified. IVC: No abnormality visualized. Pancreas: Visualized portion unremarkable. Spleen: Size and appearance within normal limits. Right Kidney: Length: 12.4 cm. Shadowing echogenic stone measuring 1.0 cm in the upper pole of the kidney. No hydronephrosis. Left Kidney: Length: 13.6 cm. Large simple appearing cysts measuring up to 7.9 cm is stable given differences in technique. No hydronephrosis. Abdominal aorta: No aneurysm visualized. Other findings: None. IMPRESSION: 1. Gallbladder wall thickening. No gallstone. No biliary ductal dilatation. Findings are nonspecific and may represent cholecystitis, reactive changes due to local inflammation (pancreas, liver, kidney),  heart failure, or hypoproteinemia. 2. New echogenic material within the gallbladder fixed to the wall may represent tumefactive sludge or a polyp measuring up to 1.5 cm. 3. Hepatic steatosis. 4. Right kidney stone. 5. No hydronephrosis. 6. Stable left kidney simple appearing cyst measuring up to 7.9 cm. Electronically Signed   By: Kristine Garbe M.D.   On: 05/31/2017 20:12    Procedures Procedures (including critical care time)  Medications Ordered in ED Medications  amLODipine (NORVASC) tablet 10 mg (10 mg Oral Given 05/31/17 1456)  irbesartan (AVAPRO) tablet 300 mg (300 mg Oral Given 05/31/17 1524)  atorvastatin (LIPITOR) tablet 20 mg (not administered)  FLUoxetine (PROZAC) tablet 20 mg (not administered)  aspirin EC tablet 81 mg (not administered)  enoxaparin (LOVENOX) injection 40 mg (not administered)  0.9 %  sodium chloride infusion (not administered)  acetaminophen (TYLENOL) tablet 650 mg (not administered)    Or  acetaminophen (TYLENOL) suppository 650 mg (not administered)  HYDROcodone-acetaminophen (NORCO/VICODIN) 5-325 MG per tablet 1-2 tablet (not administered)  senna-docusate (Senokot-S) tablet 1 tablet (not administered)  bisacodyl (DULCOLAX) EC tablet 5 mg (not administered)  ondansetron (ZOFRAN) tablet 4 mg (not administered)    Or  ondansetron (ZOFRAN) injection 4 mg (not administered)  hydrALAZINE (APRESOLINE) injection 10 mg (not administered)  ondansetron (ZOFRAN) injection 4 mg (4 mg Intravenous Given 05/31/17 1405)  sodium chloride 0.9 % bolus 1,000 mL (1,000 mLs Intravenous New Bag/Given 05/31/17 1727)  LORazepam (ATIVAN) injection 1 mg (1 mg Intravenous Given 05/31/17 1726)     Initial Impression / Assessment and Plan / ED Course  I have reviewed the triage vital signs and the nursing notes.  Pertinent labs & imaging results that were available during my care of the patient were reviewed by me and considered in my medical decision making (see chart for  details).     61 year old male with a history of a VP shunt secondary, HTN, nephrolithiasis, and biliary colic to obstructive hydrocephalus presenting with nausea, vomiting, abdominal pain, fever, and chills. The patient was seen and evaluated with Dr. Rex Kras, attending physician. Afebrile. Tachycardic. BP 201/107 on arrival. The patient has not taken his AM meds due to vomiting. Initial lactate 3.03, improving to 1.90 with IVF. Leukocytosis of 10.8. Total bili 1.5. HCT 55.9; RBC 6.45. The patient was recently evaluated for polycythemia in July. VP shunt series demonstrates the shunt is in place with no acute changes. No changes noted on CT head.  Abdominal ultrasound with gallbladder wall thickening; no gallstones or biliary  ductal dilatation; however findings may be reactive changes to local inflammation. New echogenic material within the gallbladder fixed to the wall, which may represent a sludge or a polyp measuring up to 1.5 cm. Hepatic steatosis, a left kidney cyst measuring up to 7.9 cm. KUB with right 8 mm stone in the right kidney hilum. No hydronephrosis. Consulted general surgery and spoke to Dr. Ninfa Linden regarding the patient's history, labs, and ultrasound findings who recommended admission for continued workup including a HIDA scan to determine the etiology of the inflammatory changes noted on [place]. The patient appears reasonably stabilized for admission considering the current resources, flow, and capabilities available in the ED at this time, and I doubt any other [facility] requiring further screening and/or treatment in the ED prior to admission.  Final Clinical Impressions(s) / ED Diagnoses   Final diagnoses:  VP (ventriculoperitoneal) shunt status  RUQ pain  Non-intractable vomiting with nausea, unspecified vomiting type    New Prescriptions New Prescriptions   No medications on file     Joanne Gavel, PA-C 06/02/17 0239    Little, Wenda Overland, MD 06/03/17 2012

## 2017-05-31 NOTE — H&P (Signed)
History and Physical    Luis Henderson FBP:[id number] DOB: [birth year] DOA: 05/31/2017  PCP: Arnoldo Morale, MD   Patient coming from: Home  Chief Complaint: Nausea and vomiting  HPI: Luis Henderson is a 61 y.o. male with medical history significant for obstructive hydrocephalus status post VP shunt placement in November 2017, hypertension, depression, and anxiety, now presenting to emergency department for evaluation of nausea and vomiting. Patient reports that he has been suffering from chronic dizziness and occasional nausea with vomiting since immediately after the shunt was placed in November. He typically feels better after vomiting, and did experience improvement yesterday after vomiting, but today he has had persistent nausea and vomiting without relief. He denies any abdominal pain. He denies diarrhea. Reports some possible chills, but no documented fevers.  ED Course: Upon arrival to the ED, patient is found to be afebrile, saturating well on room air, hypertensive to 200/105, and vitals otherwise stable. EKG features a normal sinus rhythm. Chemistry panels notable for a total bilirubin of 1.5. CBC features a slight leukocytosis to 10,800 and a stable polycythemia with hemoglobin of 19.5. Lactic acid is elevated to 3.03 and urinalysis is unremarkable. Lactic acid normalized to 1.90 after 1 L of normal saline. Noncontrast head CT was negative for new or acute abnormality. Radiographs of the skull through abdomen revealed appropriate positioning and appearance to his VP shunt. Right upper quadrant ultrasound demonstrates nonspecific gallbladder wall thickening without gallbladder stone or ductal dilation identified. Patient was treated with 1 L of normal saline, 10 mg Norvasc, 300 mg irbesartan, and 4 mg IV Zofran in the ED. General surgery was consulted by the ED physician and advised a medical admission with HIDA scan, indicated that we'll plan to evaluate the patient.  Review of  Systems:  All other systems reviewed and apart from HPI, are negative.  Past Medical History:  Diagnosis Date  . Anxiety   . Depression   . History of prediabetes   . Hyperlipidemia   . Hypertension     Past Surgical History:  Procedure Laterality Date  . CYSTOSCOPY WITH RETROGRADE PYELOGRAM, URETEROSCOPY AND STENT PLACEMENT Right 01/15/2015   Procedure: CYSTOSCOPY, RIGHT URETEROSCOPY/ RETROGRADE PYELOGRAM,HOLMIUM LASER/ LITHOTRIPSY, STENT PLACEMENT, URETERAL BALLOON DIALATION;  Surgeon: Kathie Rhodes, MD;  Location: [facility];  Service: Urology;  Laterality: Right;  . SKIN GRAFT     left hand  . VENTRICULOPERITONEAL SHUNT Right 09/04/2016   Procedure: SHUNT INSERTION VENTRICULAR-PERITONEAL;  Surgeon: Newman Pies, MD;  Location: [facility];  Service: Neurosurgery;  Laterality: Right;  . WISDOM TOOTH EXTRACTION       reports that he has been smoking Cigarettes.  He has a 14.00 pack-year smoking history. He has never used smokeless tobacco. He reports that he drinks about 3.0 - 3.6 oz of alcohol per week . He reports that he does not use drugs.  Allergies  Allergen Reactions  . Codeine Itching  . Lisinopril Cough    Family History  Problem Relation Age of Onset  . Hyperlipidemia Father      Prior to Admission medications   Medication Sig Start Date End Date Taking? Authorizing Provider  amLODipine (NORVASC) 10 MG tablet Take 1 tablet (10 mg total) by mouth daily. 05/09/17  Yes Arnoldo Morale, MD  aspirin EC 81 MG tablet Take 81 mg by mouth daily.   Yes [provider]  atorvastatin (LIPITOR) 20 MG tablet Take 1 tablet (20 mg total) by mouth daily. 05/09/17  Yes Arnoldo Morale, MD  FLUoxetine (PROZAC)  20 MG tablet Take 1 tablet (20 mg total) by mouth daily. 05/09/17  Yes Arnoldo Morale, MD  valsartan (DIOVAN) 320 MG tablet Take 1 tablet (320 mg total) by mouth daily. 05/09/17  Yes Arnoldo Morale, MD  traMADol (ULTRAM) 50 MG tablet Take 1 tablet (50 mg total) by mouth every 8  (eight) hours as needed. Patient not taking: Reported on 05/09/2017 08/21/16   Arnoldo Morale, MD    Physical Exam: Vitals:   05/31/17 1930 05/31/17 1945 05/31/17 2000 05/31/17 2015  BP: 135/66 (!) 150/76 (!) 141/75 (!) 141/78  Pulse: 90 85 61 65  Resp: 19 (!) 21 (!) 21 (!) 21  Temp:      TempSrc:      SpO2: 94% 97% 95% 94%      Constitutional: NAD, calm, appears uncomfortable.  Eyes: PERTLA, lids and conjunctivae normal ENMT: Mucous membranes are moist. Posterior pharynx clear of any exudate or lesions.   Neck: normal, supple, no masses, no thyromegaly Respiratory: clear to auscultation bilaterally, occasional expiratory wheeze, no crackles. Normal respiratory effort.   Cardiovascular: S1 & S2 heard, regular rate and rhythm. No extremity edema. No significant JVD. Abdomen: No distension, no tenderness, no masses palpated. Bowel sounds active.  Musculoskeletal: no clubbing / cyanosis. No joint deformity upper and lower extremities.   Skin: no significant rashes, lesions, ulcers. Warm, dry, well-perfused. Neurologic: CN 2-12 grossly intact. Sensation intact, DTR normal. Strength 5/5 in all 4 limbs.  Psychiatric: Alert and oriented x 3. Calm, cooperative.     Labs on Admission: I have personally reviewed following labs and imaging studies  CBC:  Recent Labs Lab 05/31/17 1416  WBC 10.8*  NEUTROABS 8.9*  HGB 19.5*  HCT 55.9*  MCV 86.7  PLT 703   Basic Metabolic Panel:  Recent Labs Lab 05/31/17 1416  NA 139  K 4.5  CL 105  CO2 20*  GLUCOSE 148*  BUN 8  CREATININE 0.96  CALCIUM 9.4   GFR: CrCl cannot be calculated (Unknown ideal weight.). Liver Function Tests:  Recent Labs Lab 05/31/17 1416  AST 27  ALT 17  ALKPHOS 82  BILITOT 1.5*  PROT 6.7  ALBUMIN 4.3    Recent Labs Lab 05/31/17 1416  LIPASE 44   No results for input(s): AMMONIA in the last 168 hours. Coagulation Profile: No results for input(s): INR, PROTIME in the last 168 hours. Cardiac  Enzymes: No results for input(s): CKTOTAL, CKMB, CKMBINDEX, TROPONINI in the last 168 hours. BNP (last 3 results) No results for input(s): PROBNP in the last 8760 hours. HbA1C: No results for input(s): HGBA1C in the last 72 hours. CBG: No results for input(s): GLUCAP in the last 168 hours. Lipid Profile: No results for input(s): CHOL, HDL, LDLCALC, TRIG, CHOLHDL, LDLDIRECT in the last 72 hours. Thyroid Function Tests: No results for input(s): TSH, T4TOTAL, FREET4, T3FREE, THYROIDAB in the last 72 hours. Anemia Panel: No results for input(s): VITAMINB12, FOLATE, FERRITIN, TIBC, IRON, RETICCTPCT in the last 72 hours. Urine analysis:    Component Value Date/Time   COLORURINE STRAW (A) 05/31/2017 1644   APPEARANCEUR CLEAR 05/31/2017 1644   LABSPEC 1.010 05/31/2017 1644   PHURINE 8.0 05/31/2017 1644   GLUCOSEU 50 (A) 05/31/2017 1644   HGBUR NEGATIVE 05/31/2017 1644   BILIRUBINUR NEGATIVE 05/31/2017 [place] 05/31/2017 1644   PROTEINUR 30 (A) 05/31/2017 1644   UROBILINOGEN 0.2 05/07/2017 1311   NITRITE NEGATIVE 05/31/2017 1644   LEUKOCYTESUR NEGATIVE 05/31/2017 1644   Sepsis Labs: @LABRCNTIP (procalcitonin:4,lacticidven:4) )No  results found for this or any previous visit (from the past 240 hour(s)).   Radiological Exams on Admission: Dg Skull 1-3 Views  Result Date: 05/31/2017 CLINICAL DATA:  61 y/o M; shunt series. Patient complains of nausea and dizziness. EXAM: SKULL - 1-3 VIEW; CHEST 1 VIEW; ABDOMEN - 1 VIEW; DG CERVICAL SPINE - 1 VIEW COMPARISON:  05/26/2017 CT head FINDINGS: Skull radiographs: Right parietal approach ventriculostomy catheter. The catheter tip crosses the midline. There is a reservoir in the right lateral convexity scalp. The catheter courses over the right lateral skull to the right anterior neck. No kink or discontinuity is identified along the visualized course of the catheter. Cervical spine radiograph: The catheter courses along the right  anterior aspect of the neck. No kink or discontinuity is identified along the course of the catheter. Chest radiograph: The catheter courses along the right anterior chest wall. No kink or discontinuity is identified along the course of the catheter. Normal cardiac silhouette. Calcific aortic atherosclerosis. Clear lungs. Abdomen radiograph: The catheter coils in the upper abdomen, visualized portions of the catheter do not demonstrate a kink or discontinuity. 8 mm stone within the right kidney hilum.  Normal bowel gas pattern. IMPRESSION: Right parietal approach ventriculostomy catheter. The catheter extends along the right lateral skull, right anterior neck, right anterior chest, and coils in the upper abdomen. No kink or discontinuity of the catheter is identified. Electronically Signed   By: Kristine Garbe M.D.   On: 05/31/2017 18:55   Dg Chest 1 View  Result Date: 05/31/2017 CLINICAL DATA:  61 y/o M; shunt series. Patient complains of nausea and dizziness. EXAM: SKULL - 1-3 VIEW; CHEST 1 VIEW; ABDOMEN - 1 VIEW; DG CERVICAL SPINE - 1 VIEW COMPARISON:  05/26/2017 CT head FINDINGS: Skull radiographs: Right parietal approach ventriculostomy catheter. The catheter tip crosses the midline. There is a reservoir in the right lateral convexity scalp. The catheter courses over the right lateral skull to the right anterior neck. No kink or discontinuity is identified along the visualized course of the catheter. Cervical spine radiograph: The catheter courses along the right anterior aspect of the neck. No kink or discontinuity is identified along the course of the catheter. Chest radiograph: The catheter courses along the right anterior chest wall. No kink or discontinuity is identified along the course of the catheter. Normal cardiac silhouette. Calcific aortic atherosclerosis. Clear lungs. Abdomen radiograph: The catheter coils in the upper abdomen, visualized portions of the catheter do not demonstrate a  kink or discontinuity. 8 mm stone within the right kidney hilum.  Normal bowel gas pattern. IMPRESSION: Right parietal approach ventriculostomy catheter. The catheter extends along the right lateral skull, right anterior neck, right anterior chest, and coils in the upper abdomen. No kink or discontinuity of the catheter is identified. Electronically Signed   By: Kristine Garbe M.D.   On: 05/31/2017 18:55   Dg Cervical Spine 1 View  Result Date: 05/31/2017 CLINICAL DATA:  61 y/o M; shunt series. Patient complains of nausea and dizziness. EXAM: SKULL - 1-3 VIEW; CHEST 1 VIEW; ABDOMEN - 1 VIEW; DG CERVICAL SPINE - 1 VIEW COMPARISON:  05/26/2017 CT head FINDINGS: Skull radiographs: Right parietal approach ventriculostomy catheter. The catheter tip crosses the midline. There is a reservoir in the right lateral convexity scalp. The catheter courses over the right lateral skull to the right anterior neck. No kink or discontinuity is identified along the visualized course of the catheter. Cervical spine radiograph: The catheter courses along the right  anterior aspect of the neck. No kink or discontinuity is identified along the course of the catheter. Chest radiograph: The catheter courses along the right anterior chest wall. No kink or discontinuity is identified along the course of the catheter. Normal cardiac silhouette. Calcific aortic atherosclerosis. Clear lungs. Abdomen radiograph: The catheter coils in the upper abdomen, visualized portions of the catheter do not demonstrate a kink or discontinuity. 8 mm stone within the right kidney hilum.  Normal bowel gas pattern. IMPRESSION: Right parietal approach ventriculostomy catheter. The catheter extends along the right lateral skull, right anterior neck, right anterior chest, and coils in the upper abdomen. No kink or discontinuity of the catheter is identified. Electronically Signed   By: Kristine Garbe M.D.   On: 05/31/2017 18:55   Dg Abdomen  1 View  Result Date: 05/31/2017 CLINICAL DATA:  61 y/o M; shunt series. Patient complains of nausea and dizziness. EXAM: SKULL - 1-3 VIEW; CHEST 1 VIEW; ABDOMEN - 1 VIEW; DG CERVICAL SPINE - 1 VIEW COMPARISON:  05/26/2017 CT head FINDINGS: Skull radiographs: Right parietal approach ventriculostomy catheter. The catheter tip crosses the midline. There is a reservoir in the right lateral convexity scalp. The catheter courses over the right lateral skull to the right anterior neck. No kink or discontinuity is identified along the visualized course of the catheter. Cervical spine radiograph: The catheter courses along the right anterior aspect of the neck. No kink or discontinuity is identified along the course of the catheter. Chest radiograph: The catheter courses along the right anterior chest wall. No kink or discontinuity is identified along the course of the catheter. Normal cardiac silhouette. Calcific aortic atherosclerosis. Clear lungs. Abdomen radiograph: The catheter coils in the upper abdomen, visualized portions of the catheter do not demonstrate a kink or discontinuity. 8 mm stone within the right kidney hilum.  Normal bowel gas pattern. IMPRESSION: Right parietal approach ventriculostomy catheter. The catheter extends along the right lateral skull, right anterior neck, right anterior chest, and coils in the upper abdomen. No kink or discontinuity of the catheter is identified. Electronically Signed   By: Kristine Garbe M.D.   On: 05/31/2017 18:55   Ct Head Wo Contrast  Result Date: 05/31/2017 CLINICAL DATA:  Dizziness, nausea, shunt placed in November 2017, symptoms since, history hypertension EXAM: CT HEAD WITHOUT CONTRAST TECHNIQUE: Contiguous axial images were obtained from the base of the skull through the vertex without intravenous contrast. COMPARISON:  09/05/2016 FINDINGS: Brain: Intraventricular shunt with tip at the LEFT lateral ventricle posteriorly. CSF attenuation structure 2.3  x 2.1 x 1.9 cm at the aqueductal region remains dilated, previously measured 2.2 x 2.2 x 2.0 cm, with dorsal mass effect upon the pons RIGHT of midline. Ventricles otherwise normal morphology. No midline shift or additional mass effect. Otherwise normal appearance of brain parenchyma. No intracranial hemorrhage, mass lesion or evidence acute infarction. No extra-axial fluid collections. Vascular: Atherosclerotic calcifications of the internal carotid arteries bilaterally at the skullbase. Skull: Intact Sinuses/Orbits: Clear Other: N/A IMPRESSION: CSF attenuation structure at the aqueductal region appears grossly stable since the previous exam likely representing a cyst which had previously caused ventricular obstruction. No evidence of hydrocephalus post intraventricular shunt placement. No new/acute intracranial abnormalities. Electronically Signed   By: Lavonia Dana M.D.   On: 05/31/2017 16:52   US Abdomen Complete  Result Date: 05/31/2017 CLINICAL DATA:  61 y/o M; right upper quadrant abdominal pain for 1 day. History of kidney stones. EXAM: ABDOMEN ULTRASOUND COMPLETE COMPARISON:  01/13/2015 CT abdomen  and pelvis. 01/11/2015 abdomen ultrasound. FINDINGS: Gallbladder: 9.2 mm gallbladder wall thickening. Echogenic material within the lumen of the gallbladder a fixed of the wall may represent tumefactive sludge or a polyp measuring up to 1.5 cm. No pericholecystic fluid. Negative sonographic Murphy sign. Common bile duct: Diameter: 4.1 mm Liver: Mild heterogeneous liver echotexture with overall increased echogenicity compatible with steatosis. No focal lesion identified. IVC: No abnormality visualized. Pancreas: Visualized portion unremarkable. Spleen: Size and appearance within normal limits. Right Kidney: Length: 12.4 cm. Shadowing echogenic stone measuring 1.0 cm in the upper pole of the kidney. No hydronephrosis. Left Kidney: Length: 13.6 cm. Large simple appearing cysts measuring up to 7.9 cm is stable  given differences in technique. No hydronephrosis. Abdominal aorta: No aneurysm visualized. Other findings: None. IMPRESSION: 1. Gallbladder wall thickening. No gallstone. No biliary ductal dilatation. Findings are nonspecific and may represent cholecystitis, reactive changes due to local inflammation (pancreas, liver, kidney), heart failure, or hypoproteinemia. 2. New echogenic material within the gallbladder fixed to the wall may represent tumefactive sludge or a polyp measuring up to 1.5 cm. 3. Hepatic steatosis. 4. Right kidney stone. 5. No hydronephrosis. 6. Stable left kidney simple appearing cyst measuring up to 7.9 cm. Electronically Signed   By: Kristine Garbe M.D.   On: 05/31/2017 20:12    EKG: Independently reviewed. Normal sinus rhythm.   Assessment/Plan  1. Nausea & vomiting  - Pt presents with nausea and vomiting, reports frequent occurrence since VP shunt placed in Nov '17, but nausea not relieved with vomiting today as it usually is, prompting his presentation - No abd pain and no diarrhea  - ED workup included RUQ [place], which demonstrates GB wall-thickening without stone or ductal dilatation  - Gen surgery consulting and much appreciated, recommending HIDA scan  - Plan to continue supportive care with IVF hydration and prn antiemetics, follow-up HIDA scan    2. Polycythemia  - Hgb is 19.5 on admission, similar to recent priors  - Currently being worked up by PCP - There is likely an element of hypovolemia that is currently contributing, will continue hydration and daily ASA, repeat CBC in am    3. Hydrocephalus s/p VP shunt  - Pt developed obstructive hydrocephalus last year and was treated with VP shunt in November 2017  - Head CT is stable on admission and no focal deficits identified  - Shunt appears appropriate on radiographs    4. Hypertension with hypertensive urgency  - BP elevated to 200/100 range initially in ED  - Continue Norvasc and irbesartan - Use  hydralazine IVP's prn    5. Depression, anxiety  - Continue Prozac     DVT prophylaxis: sq Lovenox  Code Status: Full  Family Communication: Discussed with patient Disposition Plan: Observe on med-surg Consults called: General surgery Admission status: Observation    Vianne Bulls, MD [facility] Pager [phone]  If 7PM-7AM, please contact night-coverage [email or web address] Password [facility]  05/31/2017, 9:29 PM

## 2017-05-31 NOTE — ED Notes (Signed)
Patient transported to CT 

## 2017-05-31 NOTE — ED Notes (Signed)
EDP aware of patient blood pressure.  

## 2017-05-31 NOTE — ED Notes (Signed)
EDP aware of patient bp  

## 2017-05-31 NOTE — ED Triage Notes (Addendum)
Pt arrives from home with c/o nausea and dizziness. Pt has had this problem since his VP shunt was placed last November. Given 8 mg zofran ODT. Pt state abdominal pain also. C/o nausea frequently with episode yesterday that resolved after voimiting. Pt seen at [facility] 7/25 and DX with Polycythemia

## 2017-06-01 ENCOUNTER — Encounter (HOSPITAL_COMMUNITY): Payer: Self-pay | Admitting: Family Medicine

## 2017-06-01 ENCOUNTER — Observation Stay (HOSPITAL_COMMUNITY): Payer: Self-pay

## 2017-06-01 DIAGNOSIS — D751 Secondary polycythemia: Secondary | ICD-10-CM

## 2017-06-01 DIAGNOSIS — I10 Essential (primary) hypertension: Secondary | ICD-10-CM

## 2017-06-01 DIAGNOSIS — F329 Major depressive disorder, single episode, unspecified: Secondary | ICD-10-CM

## 2017-06-01 DIAGNOSIS — F419 Anxiety disorder, unspecified: Secondary | ICD-10-CM

## 2017-06-01 DIAGNOSIS — R112 Nausea with vomiting, unspecified: Principal | ICD-10-CM

## 2017-06-01 DIAGNOSIS — G911 Obstructive hydrocephalus: Secondary | ICD-10-CM

## 2017-06-01 LAB — COMPREHENSIVE METABOLIC PANEL
ALT: 15 U/L — AB (ref 17–63)
AST: 16 U/L (ref 15–41)
Albumin: 3.5 g/dL (ref 3.5–5.0)
Alkaline Phosphatase: 73 U/L (ref 38–126)
Anion gap: 8 (ref 5–15)
BILIRUBIN TOTAL: 1 mg/dL (ref 0.3–1.2)
BUN: 9 mg/dL (ref 6–20)
CHLORIDE: 107 mmol/L (ref 101–111)
CO2: 24 mmol/L (ref 22–32)
Calcium: 8.9 mg/dL (ref 8.9–10.3)
Creatinine, Ser: 0.91 mg/dL (ref 0.61–1.24)
GLUCOSE: 101 mg/dL — AB (ref 65–99)
Potassium: 3.6 mmol/L (ref 3.5–5.1)
Sodium: 139 mmol/L (ref 135–145)
Total Protein: 5.7 g/dL — ABNORMAL LOW (ref 6.5–8.1)

## 2017-06-01 LAB — CBC WITH DIFFERENTIAL/PLATELET
BASOS ABS: 0 10*3/uL (ref 0.0–0.1)
Basophils Relative: 0 %
Eosinophils Absolute: 0.2 10*3/uL (ref 0.0–0.7)
Eosinophils Relative: 1 %
HEMATOCRIT: 53.7 % — AB (ref 39.0–52.0)
Hemoglobin: 18 g/dL — ABNORMAL HIGH (ref 13.0–17.0)
LYMPHS PCT: 21 %
Lymphs Abs: 2.8 10*3/uL (ref 0.7–4.0)
MCH: 29.1 pg (ref 26.0–34.0)
MCHC: 33.5 g/dL (ref 30.0–36.0)
MCV: 86.8 fL (ref 78.0–100.0)
MONO ABS: 1.4 10*3/uL — AB (ref 0.1–1.0)
MONOS PCT: 11 %
NEUTROS ABS: 8.8 10*3/uL — AB (ref 1.7–7.7)
Neutrophils Relative %: 67 %
Platelets: 311 10*3/uL (ref 150–400)
RBC: 6.19 MIL/uL — ABNORMAL HIGH (ref 4.22–5.81)
RDW: 14.9 % (ref 11.5–15.5)
WBC: 13.2 10*3/uL — ABNORMAL HIGH (ref 4.0–10.5)

## 2017-06-01 LAB — HIV ANTIBODY (ROUTINE TESTING W REFLEX): HIV Screen 4th Generation wRfx: NONREACTIVE

## 2017-06-01 MED ORDER — BOOST / RESOURCE BREEZE PO LIQD
1.0000 | Freq: Two times a day (BID) | ORAL | Status: DC
  Administered 2017-06-02: 1 via ORAL

## 2017-06-01 MED ORDER — TECHNETIUM TC 99M MEBROFENIN IV KIT
5.2400 | PACK | Freq: Once | INTRAVENOUS | Status: AC | PRN
  Administered 2017-06-01: 5.24 via INTRAVENOUS

## 2017-06-01 NOTE — Progress Notes (Signed)
PROGRESS NOTE    Luis Henderson  FHL:[id number]  DOB: [birth year]  DOA: 05/31/2017 PCP: Arnoldo Morale, MD   Brief Admission Hx: Luis Henderson is a 61 y.o. male with medical history significant for obstructive hydrocephalus status post VP shunt placement in November 2017, hypertension, depression, and anxiety, now presenting  for evaluation of nausea and vomiting with concern for gallbladder disease.   MDM/Assessment & Plan:   1. Nausea & vomiting  - Pt presents with nausea and vomiting, reports frequent occurrence since VP shunt placed in Nov '17, but nausea not relieved with vomiting today as it usually is, prompting his presentation - No abd pain and no diarrhea  - ED workup included RUQ [place], which demonstrates GB wall-thickening without stone or ductal dilatation  - Gen surgery consulting and much appreciated, HIDA scan results pending at this time.  - Plan to continue supportive care with IVF hydration and prn antiemetics - Pt was started on clear liquid diet, will see if he tolerates this today.    2. Polycythemia  - Hgb is 19.5 on admission, similar to recent priors  - Currently being worked up by PCP - There is likely an element of hypovolemia that is currently contributing, will continue hydration and daily ASA, repeat CBC in am    3. Hydrocephalus s/p VP shunt  - Pt developed obstructive hydrocephalus last year and was treated with VP shunt in November 2017  - Head CT is stable on admission and no focal deficits identified  - Shunt appears appropriate on radiographs    4. Hypertension with hypertensive urgency  - BP elevated to 200/100 range initially in ED  - Continue Norvasc and irbesartan - Use hydralazine IVP's prn    5. Depression, anxiety  - Continue Prozac    6. Tobacco Abuse - Pt asking to leave hospital to go outside and smoke, but declines nicotine patch, would not allow him to leave the floor.  Tobacco cessation counseling offered but says not  ready to quit at this time really enjoys smoking.    DVT prophylaxis: sq Lovenox  Code Status: Full  Family Communication: Discussed with patient Disposition Plan: Observe on med-surg Consults called: General surgery Admission status: Observation   Subjective: Pt asking to smoke cigarettes.   Objective: Vitals:   06/01/17 0020 06/01/17 0023 06/01/17 0636 06/01/17 1435  BP:  (!) 159/78 (!) 143/74 129/72  Pulse:  79 60 (!) 59  Resp:  20 16 16   Temp:  99.5 F (37.5 C) 97.9 F (36.6 C) 98 F (36.7 C)  TempSrc:  Oral Oral Oral  SpO2:  98% 96% 100%  Weight: 89.4 kg (197 lb 3.2 oz)     Height: 5\' 11"  (1.803 m)       Intake/Output Summary (Last 24 hours) at 06/01/17 1632 Last data filed at 06/01/17 1620  Gross per 24 hour  Intake              616 ml  Output              500 ml  Net              116 ml   Filed Weights   06/01/17 0020  Weight: 89.4 kg (197 lb 3.2 oz)     REVIEW OF SYSTEMS  As per history otherwise all reviewed and reported negative  Exam:  General exam: awake, alert, nAD, cooperative.  Respiratory system: No increased work of breathing. Cardiovascular system: S1 & S2 heard,  RRR. No JVD, murmurs, gallops, clicks or pedal edema. Gastrointestinal system: Abdomen is nondistended, soft and nontender. Normal bowel sounds heard. Central nervous system: Alert and oriented. No focal neurological deficits. Extremities: no cyanosis or clubbing.   Data Reviewed: Basic Metabolic Panel:  Recent Labs Lab 05/31/17 1416 06/01/17 0543  NA 139 139  K 4.5 3.6  CL 105 107  CO2 20* 24  GLUCOSE 148* 101*  BUN 8 9  CREATININE 0.96 0.91  CALCIUM 9.4 8.9   Liver Function Tests:  Recent Labs Lab 05/31/17 1416 06/01/17 0543  AST 27 16  ALT 17 15*  ALKPHOS 82 73  BILITOT 1.5* 1.0  PROT 6.7 5.7*  ALBUMIN 4.3 3.5    Recent Labs Lab 05/31/17 1416  LIPASE 44   No results for input(s): AMMONIA in the last 168 hours. CBC:  Recent Labs Lab  05/31/17 1416 06/01/17 0543  WBC 10.8* 13.2*  NEUTROABS 8.9* 8.8*  HGB 19.5* 18.0*  HCT 55.9* 53.7*  MCV 86.7 86.8  PLT 313 311   Cardiac Enzymes: No results for input(s): CKTOTAL, CKMB, CKMBINDEX, TROPONINI in the last 168 hours. CBG (last 3)  No results for input(s): GLUCAP in the last 72 hours. No results found for this or any previous visit (from the past 240 hour(s)).   Studies: Dg Skull 1-3 Views  Result Date: 05/31/2017 CLINICAL DATA:  61 y/o M; shunt series. Patient complains of nausea and dizziness. EXAM: SKULL - 1-3 VIEW; CHEST 1 VIEW; ABDOMEN - 1 VIEW; DG CERVICAL SPINE - 1 VIEW COMPARISON:  05/26/2017 CT head FINDINGS: Skull radiographs: Right parietal approach ventriculostomy catheter. The catheter tip crosses the midline. There is a reservoir in the right lateral convexity scalp. The catheter courses over the right lateral skull to the right anterior neck. No kink or discontinuity is identified along the visualized course of the catheter. Cervical spine radiograph: The catheter courses along the right anterior aspect of the neck. No kink or discontinuity is identified along the course of the catheter. Chest radiograph: The catheter courses along the right anterior chest wall. No kink or discontinuity is identified along the course of the catheter. Normal cardiac silhouette. Calcific aortic atherosclerosis. Clear lungs. Abdomen radiograph: The catheter coils in the upper abdomen, visualized portions of the catheter do not demonstrate a kink or discontinuity. 8 mm stone within the right kidney hilum.  Normal bowel gas pattern. IMPRESSION: Right parietal approach ventriculostomy catheter. The catheter extends along the right lateral skull, right anterior neck, right anterior chest, and coils in the upper abdomen. No kink or discontinuity of the catheter is identified. Electronically Signed   By: Kristine Garbe M.D.   On: 05/31/2017 18:55   Dg Chest 1 View  Result Date:  05/31/2017 CLINICAL DATA:  61 y/o M; shunt series. Patient complains of nausea and dizziness. EXAM: SKULL - 1-3 VIEW; CHEST 1 VIEW; ABDOMEN - 1 VIEW; DG CERVICAL SPINE - 1 VIEW COMPARISON:  05/26/2017 CT head FINDINGS: Skull radiographs: Right parietal approach ventriculostomy catheter. The catheter tip crosses the midline. There is a reservoir in the right lateral convexity scalp. The catheter courses over the right lateral skull to the right anterior neck. No kink or discontinuity is identified along the visualized course of the catheter. Cervical spine radiograph: The catheter courses along the right anterior aspect of the neck. No kink or discontinuity is identified along the course of the catheter. Chest radiograph: The catheter courses along the right anterior chest wall. No kink or discontinuity is  identified along the course of the catheter. Normal cardiac silhouette. Calcific aortic atherosclerosis. Clear lungs. Abdomen radiograph: The catheter coils in the upper abdomen, visualized portions of the catheter do not demonstrate a kink or discontinuity. 8 mm stone within the right kidney hilum.  Normal bowel gas pattern. IMPRESSION: Right parietal approach ventriculostomy catheter. The catheter extends along the right lateral skull, right anterior neck, right anterior chest, and coils in the upper abdomen. No kink or discontinuity of the catheter is identified. Electronically Signed   By: Kristine Garbe M.D.   On: 05/31/2017 18:55   Dg Cervical Spine 1 View  Result Date: 05/31/2017 CLINICAL DATA:  61 y/o M; shunt series. Patient complains of nausea and dizziness. EXAM: SKULL - 1-3 VIEW; CHEST 1 VIEW; ABDOMEN - 1 VIEW; DG CERVICAL SPINE - 1 VIEW COMPARISON:  05/26/2017 CT head FINDINGS: Skull radiographs: Right parietal approach ventriculostomy catheter. The catheter tip crosses the midline. There is a reservoir in the right lateral convexity scalp. The catheter courses over the right lateral  skull to the right anterior neck. No kink or discontinuity is identified along the visualized course of the catheter. Cervical spine radiograph: The catheter courses along the right anterior aspect of the neck. No kink or discontinuity is identified along the course of the catheter. Chest radiograph: The catheter courses along the right anterior chest wall. No kink or discontinuity is identified along the course of the catheter. Normal cardiac silhouette. Calcific aortic atherosclerosis. Clear lungs. Abdomen radiograph: The catheter coils in the upper abdomen, visualized portions of the catheter do not demonstrate a kink or discontinuity. 8 mm stone within the right kidney hilum.  Normal bowel gas pattern. IMPRESSION: Right parietal approach ventriculostomy catheter. The catheter extends along the right lateral skull, right anterior neck, right anterior chest, and coils in the upper abdomen. No kink or discontinuity of the catheter is identified. Electronically Signed   By: Kristine Garbe M.D.   On: 05/31/2017 18:55   Dg Abdomen 1 View  Result Date: 05/31/2017 CLINICAL DATA:  61 y/o M; shunt series. Patient complains of nausea and dizziness. EXAM: SKULL - 1-3 VIEW; CHEST 1 VIEW; ABDOMEN - 1 VIEW; DG CERVICAL SPINE - 1 VIEW COMPARISON:  05/26/2017 CT head FINDINGS: Skull radiographs: Right parietal approach ventriculostomy catheter. The catheter tip crosses the midline. There is a reservoir in the right lateral convexity scalp. The catheter courses over the right lateral skull to the right anterior neck. No kink or discontinuity is identified along the visualized course of the catheter. Cervical spine radiograph: The catheter courses along the right anterior aspect of the neck. No kink or discontinuity is identified along the course of the catheter. Chest radiograph: The catheter courses along the right anterior chest wall. No kink or discontinuity is identified along the course of the catheter. Normal  cardiac silhouette. Calcific aortic atherosclerosis. Clear lungs. Abdomen radiograph: The catheter coils in the upper abdomen, visualized portions of the catheter do not demonstrate a kink or discontinuity. 8 mm stone within the right kidney hilum.  Normal bowel gas pattern. IMPRESSION: Right parietal approach ventriculostomy catheter. The catheter extends along the right lateral skull, right anterior neck, right anterior chest, and coils in the upper abdomen. No kink or discontinuity of the catheter is identified. Electronically Signed   By: Kristine Garbe M.D.   On: 05/31/2017 18:55   Ct Head Wo Contrast  Result Date: 05/31/2017 CLINICAL DATA:  Dizziness, nausea, shunt placed in November 2017, symptoms since, history hypertension EXAM:  CT HEAD WITHOUT CONTRAST TECHNIQUE: Contiguous axial images were obtained from the base of the skull through the vertex without intravenous contrast. COMPARISON:  09/05/2016 FINDINGS: Brain: Intraventricular shunt with tip at the LEFT lateral ventricle posteriorly. CSF attenuation structure 2.3 x 2.1 x 1.9 cm at the aqueductal region remains dilated, previously measured 2.2 x 2.2 x 2.0 cm, with dorsal mass effect upon the pons RIGHT of midline. Ventricles otherwise normal morphology. No midline shift or additional mass effect. Otherwise normal appearance of brain parenchyma. No intracranial hemorrhage, mass lesion or evidence acute infarction. No extra-axial fluid collections. Vascular: Atherosclerotic calcifications of the internal carotid arteries bilaterally at the skullbase. Skull: Intact Sinuses/Orbits: Clear Other: N/A IMPRESSION: CSF attenuation structure at the aqueductal region appears grossly stable since the previous exam likely representing a cyst which had previously caused ventricular obstruction. No evidence of hydrocephalus post intraventricular shunt placement. No new/acute intracranial abnormalities. Electronically Signed   By: Lavonia Dana M.D.   On:  05/31/2017 16:52   US Abdomen Complete  Result Date: 05/31/2017 CLINICAL DATA:  61 y/o M; right upper quadrant abdominal pain for 1 day. History of kidney stones. EXAM: ABDOMEN ULTRASOUND COMPLETE COMPARISON:  01/13/2015 CT abdomen and pelvis. 01/11/2015 abdomen ultrasound. FINDINGS: Gallbladder: 9.2 mm gallbladder wall thickening. Echogenic material within the lumen of the gallbladder a fixed of the wall may represent tumefactive sludge or a polyp measuring up to 1.5 cm. No pericholecystic fluid. Negative sonographic Murphy sign. Common bile duct: Diameter: 4.1 mm Liver: Mild heterogeneous liver echotexture with overall increased echogenicity compatible with steatosis. No focal lesion identified. IVC: No abnormality visualized. Pancreas: Visualized portion unremarkable. Spleen: Size and appearance within normal limits. Right Kidney: Length: 12.4 cm. Shadowing echogenic stone measuring 1.0 cm in the upper pole of the kidney. No hydronephrosis. Left Kidney: Length: 13.6 cm. Large simple appearing cysts measuring up to 7.9 cm is stable given differences in technique. No hydronephrosis. Abdominal aorta: No aneurysm visualized. Other findings: None. IMPRESSION: 1. Gallbladder wall thickening. No gallstone. No biliary ductal dilatation. Findings are nonspecific and may represent cholecystitis, reactive changes due to local inflammation (pancreas, liver, kidney), heart failure, or hypoproteinemia. 2. New echogenic material within the gallbladder fixed to the wall may represent tumefactive sludge or a polyp measuring up to 1.5 cm. 3. Hepatic steatosis. 4. Right kidney stone. 5. No hydronephrosis. 6. Stable left kidney simple appearing cyst measuring up to 7.9 cm. Electronically Signed   By: Kristine Garbe M.D.   On: 05/31/2017 20:12   Nm Hepato W/eject Fract  Result Date: 06/01/2017 CLINICAL DATA:  Right upper quadrant pain with gallbladder wall thickening. EXAM: NUCLEAR MEDICINE HEPATOBILIARY IMAGING  WITH GALLBLADDER EF TECHNIQUE: Sequential images of the abdomen were obtained out to 60 minutes following intravenous administration of radiopharmaceutical. After oral ingestion of 8 ounces of Half-and-Half cream, gallbladder ejection fraction was determined. RADIOPHARMACEUTICALS:  5.24 mCi Tc-96m  Choletec IV COMPARISON:  None. FINDINGS: Prompt uptake and biliary excretion of activity by the liver is seen. Gallbladder activity is visualized, consistent with patency of cystic duct. Biliary activity passes into small bowel, consistent with patent common bile duct. Calculated gallbladder ejection fraction is 23%. (Normal gallbladder ejection fraction with half-and-half is greater than 33%.) IMPRESSION: 1. Normal hepatobiliary patency study with decreased gallbladder ejection fraction. 2. Patent cystic duct. Electronically Signed   By: Misty Stanley M.D.   On: 06/01/2017 13:00     Scheduled Meds: . amLODipine  10 mg Oral Daily  . aspirin EC  81 mg Oral  Daily  . atorvastatin  20 mg Oral q1800  . enoxaparin (LOVENOX) injection  40 mg Subcutaneous Daily  . [START ON 06/02/2017] feeding supplement  1 Container Oral BID BM  . FLUoxetine  20 mg Oral Daily  . irbesartan  300 mg Oral Daily  . mouth rinse  15 mL Mouth Rinse BID   Continuous Infusions:  Principal Problem:   Nausea and vomiting Active Problems:   HTN (hypertension)   Acquired obstructive hydrocephalus   Anxiety and depression   S/P VP shunt   Hypertensive urgency   Polycythemia   Nausea & vomiting   Time spent:   Irwin Brakeman, MD, FAAFP [facility] Pager [phone]  If 7PM-7AM, please contact night-coverage [email or web address] Password [email or web address] 06/01/2017, 4:32 PM    LOS: 0 days

## 2017-06-01 NOTE — Consult Note (Signed)
Reason for Consult:right upper quadrant abdominal pain, rule out cholecystitis Referring Physician: Dr. Christia Reading Opyd  Luis Henderson is an 61 y.o. male.  HPI: this is a 24 year old gentleman with a medical history significant for obstructive hydrocephalus status post VP shunt placement who presented yesterday with the sudden onset of right upper quadrant pain, nausea, and vomiting. He reports that this occurred about one hour after drinking milk. He has had similar attacks over the past 20 years per his report. This is been the worst .He was also found to be hypertensive.  He has had ultrasounds in the past which were negative for gallstones. Ultrasound yesterday, however, did show all bladder wall thickening and possible gallbladder sludge. Currently, his pain is almost resolved.  Past Medical History:  Diagnosis Date  . Anxiety   . Depression   . History of prediabetes   . Hyperlipidemia   . Hypertension     Past Surgical History:  Procedure Laterality Date  . CYSTOSCOPY WITH RETROGRADE PYELOGRAM, URETEROSCOPY AND STENT PLACEMENT Right 01/15/2015   Procedure: CYSTOSCOPY, RIGHT URETEROSCOPY/ RETROGRADE PYELOGRAM,HOLMIUM LASER/ LITHOTRIPSY, STENT PLACEMENT, URETERAL BALLOON DIALATION;  Surgeon: Kathie Rhodes, MD;  Location: [facility];  Service: Urology;  Laterality: Right;  . SKIN GRAFT     left hand  . VENTRICULOPERITONEAL SHUNT Right 09/04/2016   Procedure: SHUNT INSERTION VENTRICULAR-PERITONEAL;  Surgeon: Newman Pies, MD;  Location: [facility];  Service: Neurosurgery;  Laterality: Right;  . WISDOM TOOTH EXTRACTION      Family History  Problem Relation Age of Onset  . Hyperlipidemia Father     Social History:  reports that he has been smoking Cigarettes.  He has a 14.00 pack-year smoking history. He has never used smokeless tobacco. He reports that he drinks about 3.0 - 3.6 oz of alcohol per week . He reports that he does not use drugs.  Allergies:  Allergies  Allergen Reactions  .  Codeine Itching  . Lisinopril Cough    Medications: I have reviewed the patient's current medications.  Results for orders placed or performed during the hospital encounter of 05/31/17 (from the past 48 hour(s))  CBC with Differential     Status: Abnormal   Collection Time: 05/31/17  2:16 PM  Result Value Ref Range   WBC 10.8 (H) 4.0 - 10.5 K/uL   RBC 6.45 (H) 4.22 - 5.81 MIL/uL   Hemoglobin 19.5 (H) 13.0 - 17.0 g/dL   HCT 55.9 (H) 39.0 - 52.0 %   MCV 86.7 78.0 - 100.0 fL   MCH 30.2 26.0 - 34.0 pg   MCHC 34.9 30.0 - 36.0 g/dL   RDW 15.2 11.5 - 15.5 %   Platelets 313 150 - 400 K/uL   Neutrophils Relative % 83 %   Neutro Abs 8.9 (H) 1.7 - 7.7 K/uL   Lymphocytes Relative 11 %   Lymphs Abs 1.2 0.7 - 4.0 K/uL   Monocytes Relative 5 %   Monocytes Absolute 0.6 0.1 - 1.0 K/uL   Eosinophils Relative 1 %   Eosinophils Absolute 0.1 0.0 - 0.7 K/uL   Basophils Relative 1 %   Basophils Absolute 0.1 0.0 - 0.1 K/uL  Comprehensive metabolic panel     Status: Abnormal   Collection Time: 05/31/17  2:16 PM  Result Value Ref Range   Sodium 139 135 - 145 mmol/L   Potassium 4.5 3.5 - 5.1 mmol/L   Chloride 105 101 - 111 mmol/L   CO2 20 (L) 22 - 32 mmol/L   Glucose, Bld 148 (  H) 65 - 99 mg/dL   BUN 8 6 - 20 mg/dL   Creatinine, Ser 0.96 0.61 - 1.24 mg/dL   Calcium 9.4 8.9 - 10.3 mg/dL   Total Protein 6.7 6.5 - 8.1 g/dL   Albumin 4.3 3.5 - 5.0 g/dL   AST 27 15 - 41 U/L   ALT 17 17 - 63 U/L   Alkaline Phosphatase 82 38 - 126 U/L   Total Bilirubin 1.5 (H) 0.3 - 1.2 mg/dL   GFR calc non Af Amer >60 >60 mL/min   GFR calc Af Amer >60 >60 mL/min    Comment: (NOTE) The eGFR has been calculated using the CKD EPI equation. This calculation has not been validated in all clinical situations. eGFR's persistently <60 mL/min signify possible Chronic Kidney Disease.    Anion gap 14 5 - 15  Lipase, blood     Status: None   Collection Time: 05/31/17  2:16 PM  Result Value Ref Range   Lipase 44 11 - 51  U/L  I-Stat CG4 Lactic Acid, ED     Status: Abnormal   Collection Time: 05/31/17  2:25 PM  Result Value Ref Range   Lactic Acid, Venous 3.03 (HH) 0.5 - 1.9 mmol/L   Comment NOTIFIED PHYSICIAN   Urinalysis, Routine w reflex microscopic     Status: Abnormal   Collection Time: 05/31/17  4:44 PM  Result Value Ref Range   Color, Urine STRAW (A) YELLOW   APPearance CLEAR CLEAR   Specific Gravity, Urine 1.010 1.005 - 1.030   pH 8.0 5.0 - 8.0   Glucose, UA 50 (A) NEGATIVE mg/dL   Hgb urine dipstick NEGATIVE NEGATIVE   Bilirubin Urine NEGATIVE NEGATIVE   Ketones, ur NEGATIVE NEGATIVE mg/dL   Protein, ur 30 (A) NEGATIVE mg/dL   Nitrite NEGATIVE NEGATIVE   Leukocytes, UA NEGATIVE NEGATIVE   RBC / HPF 0-5 0 - 5 RBC/hpf   WBC, UA 0-5 0 - 5 WBC/hpf   Bacteria, UA RARE (A) NONE SEEN   Squamous Epithelial / LPF NONE SEEN NONE SEEN   Mucous PRESENT   I-Stat CG4 Lactic Acid, ED     Status: None   Collection Time: 05/31/17  6:36 PM  Result Value Ref Range   Lactic Acid, Venous 1.90 0.5 - 1.9 mmol/L    Dg Skull 1-3 Views  Result Date: 05/31/2017 CLINICAL DATA:  61 y/o M; shunt series. Patient complains of nausea and dizziness. EXAM: SKULL - 1-3 VIEW; CHEST 1 VIEW; ABDOMEN - 1 VIEW; DG CERVICAL SPINE - 1 VIEW COMPARISON:  05/26/2017 CT head FINDINGS: Skull radiographs: Right parietal approach ventriculostomy catheter. The catheter tip crosses the midline. There is a reservoir in the right lateral convexity scalp. The catheter courses over the right lateral skull to the right anterior neck. No kink or discontinuity is identified along the visualized course of the catheter. Cervical spine radiograph: The catheter courses along the right anterior aspect of the neck. No kink or discontinuity is identified along the course of the catheter. Chest radiograph: The catheter courses along the right anterior chest wall. No kink or discontinuity is identified along the course of the catheter. Normal cardiac  silhouette. Calcific aortic atherosclerosis. Clear lungs. Abdomen radiograph: The catheter coils in the upper abdomen, visualized portions of the catheter do not demonstrate a kink or discontinuity. 8 mm stone within the right kidney hilum.  Normal bowel gas pattern. IMPRESSION: Right parietal approach ventriculostomy catheter. The catheter extends along the right lateral skull, right anterior  neck, right anterior chest, and coils in the upper abdomen. No kink or discontinuity of the catheter is identified. Electronically Signed   By: Kristine Garbe M.D.   On: 05/31/2017 18:55   Dg Chest 1 View  Result Date: 05/31/2017 CLINICAL DATA:  61 y/o M; shunt series. Patient complains of nausea and dizziness. EXAM: SKULL - 1-3 VIEW; CHEST 1 VIEW; ABDOMEN - 1 VIEW; DG CERVICAL SPINE - 1 VIEW COMPARISON:  05/26/2017 CT head FINDINGS: Skull radiographs: Right parietal approach ventriculostomy catheter. The catheter tip crosses the midline. There is a reservoir in the right lateral convexity scalp. The catheter courses over the right lateral skull to the right anterior neck. No kink or discontinuity is identified along the visualized course of the catheter. Cervical spine radiograph: The catheter courses along the right anterior aspect of the neck. No kink or discontinuity is identified along the course of the catheter. Chest radiograph: The catheter courses along the right anterior chest wall. No kink or discontinuity is identified along the course of the catheter. Normal cardiac silhouette. Calcific aortic atherosclerosis. Clear lungs. Abdomen radiograph: The catheter coils in the upper abdomen, visualized portions of the catheter do not demonstrate a kink or discontinuity. 8 mm stone within the right kidney hilum.  Normal bowel gas pattern. IMPRESSION: Right parietal approach ventriculostomy catheter. The catheter extends along the right lateral skull, right anterior neck, right anterior chest, and coils in the  upper abdomen. No kink or discontinuity of the catheter is identified. Electronically Signed   By: Kristine Garbe M.D.   On: 05/31/2017 18:55   Dg Cervical Spine 1 View  Result Date: 05/31/2017 CLINICAL DATA:  61 y/o M; shunt series. Patient complains of nausea and dizziness. EXAM: SKULL - 1-3 VIEW; CHEST 1 VIEW; ABDOMEN - 1 VIEW; DG CERVICAL SPINE - 1 VIEW COMPARISON:  05/26/2017 CT head FINDINGS: Skull radiographs: Right parietal approach ventriculostomy catheter. The catheter tip crosses the midline. There is a reservoir in the right lateral convexity scalp. The catheter courses over the right lateral skull to the right anterior neck. No kink or discontinuity is identified along the visualized course of the catheter. Cervical spine radiograph: The catheter courses along the right anterior aspect of the neck. No kink or discontinuity is identified along the course of the catheter. Chest radiograph: The catheter courses along the right anterior chest wall. No kink or discontinuity is identified along the course of the catheter. Normal cardiac silhouette. Calcific aortic atherosclerosis. Clear lungs. Abdomen radiograph: The catheter coils in the upper abdomen, visualized portions of the catheter do not demonstrate a kink or discontinuity. 8 mm stone within the right kidney hilum.  Normal bowel gas pattern. IMPRESSION: Right parietal approach ventriculostomy catheter. The catheter extends along the right lateral skull, right anterior neck, right anterior chest, and coils in the upper abdomen. No kink or discontinuity of the catheter is identified. Electronically Signed   By: Kristine Garbe M.D.   On: 05/31/2017 18:55   Dg Abdomen 1 View  Result Date: 05/31/2017 CLINICAL DATA:  61 y/o M; shunt series. Patient complains of nausea and dizziness. EXAM: SKULL - 1-3 VIEW; CHEST 1 VIEW; ABDOMEN - 1 VIEW; DG CERVICAL SPINE - 1 VIEW COMPARISON:  05/26/2017 CT head FINDINGS: Skull radiographs: Right  parietal approach ventriculostomy catheter. The catheter tip crosses the midline. There is a reservoir in the right lateral convexity scalp. The catheter courses over the right lateral skull to the right anterior neck. No kink or discontinuity is identified  along the visualized course of the catheter. Cervical spine radiograph: The catheter courses along the right anterior aspect of the neck. No kink or discontinuity is identified along the course of the catheter. Chest radiograph: The catheter courses along the right anterior chest wall. No kink or discontinuity is identified along the course of the catheter. Normal cardiac silhouette. Calcific aortic atherosclerosis. Clear lungs. Abdomen radiograph: The catheter coils in the upper abdomen, visualized portions of the catheter do not demonstrate a kink or discontinuity. 8 mm stone within the right kidney hilum.  Normal bowel gas pattern. IMPRESSION: Right parietal approach ventriculostomy catheter. The catheter extends along the right lateral skull, right anterior neck, right anterior chest, and coils in the upper abdomen. No kink or discontinuity of the catheter is identified. Electronically Signed   By: Kristine Garbe M.D.   On: 05/31/2017 18:55   Ct Head Wo Contrast  Result Date: 05/31/2017 CLINICAL DATA:  Dizziness, nausea, shunt placed in November 2017, symptoms since, history hypertension EXAM: CT HEAD WITHOUT CONTRAST TECHNIQUE: Contiguous axial images were obtained from the base of the skull through the vertex without intravenous contrast. COMPARISON:  09/05/2016 FINDINGS: Brain: Intraventricular shunt with tip at the LEFT lateral ventricle posteriorly. CSF attenuation structure 2.3 x 2.1 x 1.9 cm at the aqueductal region remains dilated, previously measured 2.2 x 2.2 x 2.0 cm, with dorsal mass effect upon the pons RIGHT of midline. Ventricles otherwise normal morphology. No midline shift or additional mass effect. Otherwise normal appearance  of brain parenchyma. No intracranial hemorrhage, mass lesion or evidence acute infarction. No extra-axial fluid collections. Vascular: Atherosclerotic calcifications of the internal carotid arteries bilaterally at the skullbase. Skull: Intact Sinuses/Orbits: Clear Other: N/A IMPRESSION: CSF attenuation structure at the aqueductal region appears grossly stable since the previous exam likely representing a cyst which had previously caused ventricular obstruction. No evidence of hydrocephalus post intraventricular shunt placement. No new/acute intracranial abnormalities. Electronically Signed   By: Lavonia Dana M.D.   On: 05/31/2017 16:52   US Abdomen Complete  Result Date: 05/31/2017 CLINICAL DATA:  61 y/o M; right upper quadrant abdominal pain for 1 day. History of kidney stones. EXAM: ABDOMEN ULTRASOUND COMPLETE COMPARISON:  01/13/2015 CT abdomen and pelvis. 01/11/2015 abdomen ultrasound. FINDINGS: Gallbladder: 9.2 mm gallbladder wall thickening. Echogenic material within the lumen of the gallbladder a fixed of the wall may represent tumefactive sludge or a polyp measuring up to 1.5 cm. No pericholecystic fluid. Negative sonographic Murphy sign. Common bile duct: Diameter: 4.1 mm Liver: Mild heterogeneous liver echotexture with overall increased echogenicity compatible with steatosis. No focal lesion identified. IVC: No abnormality visualized. Pancreas: Visualized portion unremarkable. Spleen: Size and appearance within normal limits. Right Kidney: Length: 12.4 cm. Shadowing echogenic stone measuring 1.0 cm in the upper pole of the kidney. No hydronephrosis. Left Kidney: Length: 13.6 cm. Large simple appearing cysts measuring up to 7.9 cm is stable given differences in technique. No hydronephrosis. Abdominal aorta: No aneurysm visualized. Other findings: None. IMPRESSION: 1. Gallbladder wall thickening. No gallstone. No biliary ductal dilatation. Findings are nonspecific and may represent cholecystitis, reactive  changes due to local inflammation (pancreas, liver, kidney), heart failure, or hypoproteinemia. 2. New echogenic material within the gallbladder fixed to the wall may represent tumefactive sludge or a polyp measuring up to 1.5 cm. 3. Hepatic steatosis. 4. Right kidney stone. 5. No hydronephrosis. 6. Stable left kidney simple appearing cyst measuring up to 7.9 cm. Electronically Signed   By: Kristine Garbe M.D.   On: 05/31/2017 20:12  Review of Systems  Constitutional: Negative for chills and fever.  Gastrointestinal: Positive for abdominal pain, nausea and vomiting. Negative for blood in stool and constipation.  All other systems reviewed and are negative.  Blood pressure (!) 159/78, pulse 79, temperature 99.5 F (37.5 C), temperature source Oral, resp. rate 20, height '5\' 11"'  (1.803 m), weight 89.4 kg (197 lb 3.2 oz), SpO2 98 %. Physical Exam  Constitutional: He is oriented to person, place, and time. He appears well-developed and well-nourished. No distress.  HENT:  Head: Normocephalic and atraumatic.  Right Ear: External ear normal.  Left Ear: External ear normal.  Nose: Nose normal.  Mouth/Throat: No oropharyngeal exudate.  Eyes: Pupils are equal, round, and reactive to light. No scleral icterus.  Neck: Normal range of motion. No tracheal deviation present.  Cardiovascular: Normal rate, regular rhythm, normal heart sounds and intact distal pulses.   No murmur heard. Respiratory: Effort normal and breath sounds normal. No respiratory distress. He has no wheezes.  GI: Soft. There is tenderness. There is no guarding.  His abdomen is soft. There is minimal tenderness in the right upper quadrant  Musculoskeletal: Normal range of motion. He exhibits no edema.  Lymphadenopathy:    He has no cervical adenopathy.  Neurological: He is alert and oriented to person, place, and time.  Skin: Skin is warm. No rash noted. He is not diaphoretic. No erythema.  Psychiatric: His behavior is  normal. Judgment normal.    Assessment/Plan: Right upper quadrant abdominal pain with gallbladder wall thickening and sludge  He does not appear to have cholecystitis based on physical examination and ultrasound. I believe he needs a HIDA scan with ejection fraction to help confirm whether there is cholecystitis present or biliary dyskinesia as the source of his discomfort before considering cholecystectomy.  His polycythemia is being worked up as well as his hypertension. I believe he can start clear liquids after the scan is complete. We will follow him with you.  Luis Henderson A 06/01/2017, 5:39 AM

## 2017-06-01 NOTE — Progress Notes (Signed)
Initial Nutrition Assessment  DOCUMENTATION CODES:   Not applicable  INTERVENTION:  Once diet advances, provide Boost Breeze po BID, each supplement provides 250 kcal and 9 grams of protein.  NUTRITION DIAGNOSIS:   Increased nutrient needs related to acute illness as evidenced by estimated needs.  GOAL:   Patient will meet greater than or equal to 90% of their needs  MONITOR:   Supplement acceptance, Diet advancement, Labs, Weight trends, Skin, I & O's  REASON FOR ASSESSMENT:   Malnutrition Screening Tool    ASSESSMENT:    61 y.o. male with medical history significant for obstructive hydrocephalus status post VP shunt placement in November 2017, hypertension, depression, and anxiety, now presenting to emergency department for evaluation of nausea and vomiting.  Pt is currently NPO. Pt was unavailable at procedure during attempted time of visit. Per MD note, pt with right upper quadrant abdominal pain with gallbladder wall thickening and sludge. Plans for HIDA scan. MD reports pt can be advanced to clear liquids post procedure. RD to order Boost Breeze to aid in caloric and protein needs. Per weight records, pt with a 18% weight loss in 8 months. RD to continue to monitor. Unable to complete Nutrition-Focused physical exam at this time.   Labs and medications reviewed.   Diet Order:  Diet NPO time specified Except for: Ice Chips, Sips with Meds  Skin:  Reviewed, no issues  Last BM:  8/16  Height:   Ht Readings from Last 1 Encounters:  06/01/17 5\' 11"  (1.803 m)    Weight:   Wt Readings from Last 1 Encounters:  06/01/17 197 lb 3.2 oz (89.4 kg)    Ideal Body Weight:  78 kg  BMI:  Body mass index is 27.5 kg/m.  Estimated Nutritional Needs:   Kcal:  2100-2300  Protein:  105-115 grams  Fluid:  2.1 - 2.3 L/day  EDUCATION NEEDS:   No education needs identified at this time  Corrin Parker, MS, RD, LDN Pager # [phone] After hours/ weekend pager #  [phone]

## 2017-06-02 DIAGNOSIS — Z982 Presence of cerebrospinal fluid drainage device: Secondary | ICD-10-CM

## 2017-06-02 LAB — COMPREHENSIVE METABOLIC PANEL
ALBUMIN: 4.1 g/dL (ref 3.5–5.0)
ALK PHOS: 73 U/L (ref 38–126)
ALT: 17 U/L (ref 17–63)
AST: 20 U/L (ref 15–41)
Anion gap: 9 (ref 5–15)
BUN: 14 mg/dL (ref 6–20)
CHLORIDE: 106 mmol/L (ref 101–111)
CO2: 23 mmol/L (ref 22–32)
CREATININE: 1.04 mg/dL (ref 0.61–1.24)
Calcium: 9.3 mg/dL (ref 8.9–10.3)
GFR calc Af Amer: >60 mL/min (ref >60)
GFR calc non Af Amer: >60 mL/min (ref >60)
GLUCOSE: 138 mg/dL — AB (ref 65–99)
Potassium: 4.1 mmol/L (ref 3.5–5.1)
SODIUM: 138 mmol/L (ref 135–145)
Total Bilirubin: 1.1 mg/dL (ref 0.3–1.2)
Total Protein: 6.6 g/dL (ref 6.5–8.1)

## 2017-06-02 LAB — CBC WITH DIFFERENTIAL/PLATELET
BASOS ABS: 0.1 10*3/uL (ref 0.0–0.1)
Basophils Relative: 1 %
EOS ABS: 0.3 10*3/uL (ref 0.0–0.7)
EOS PCT: 2 %
HCT: 55.1 % — ABNORMAL HIGH (ref 39.0–52.0)
HEMOGLOBIN: 18.6 g/dL — AB (ref 13.0–17.0)
Lymphocytes Relative: 22 %
Lymphs Abs: 2.6 10*3/uL (ref 0.7–4.0)
MCH: 29.5 pg (ref 26.0–34.0)
MCHC: 33.8 g/dL (ref 30.0–36.0)
MCV: 87.5 fL (ref 78.0–100.0)
Monocytes Absolute: 1.2 10*3/uL — ABNORMAL HIGH (ref 0.1–1.0)
Monocytes Relative: 10 %
NEUTROS PCT: 65 %
Neutro Abs: 7.5 10*3/uL (ref 1.7–7.7)
PLATELETS: 297 10*3/uL (ref 150–400)
RBC: 6.3 MIL/uL — AB (ref 4.22–5.81)
RDW: 14.8 % (ref 11.5–15.5)
WBC: 11.6 10*3/uL — AB (ref 4.0–10.5)

## 2017-06-02 LAB — LIPASE, BLOOD: LIPASE: 179 U/L — AB (ref 11–51)

## 2017-06-02 MED ORDER — NICOTINE 21 MG/24HR TD PT24
21.0000 mg | MEDICATED_PATCH | Freq: Every day | TRANSDERMAL | Status: DC
  Administered 2017-06-02: 21 mg via TRANSDERMAL
  Filled 2017-06-02: qty 1

## 2017-06-02 NOTE — Progress Notes (Signed)
Pt left the floor without telling anyone, nurse tech went to room to do vital signs and pt was not availabe, I was a bout to call security and saw pt coming on to the floor, I asked pt where he went said he needed to step out for few minutes, he refused to tell me what he went out there to do, I have advised pt that he cannot leave the floor without doctors order to do so, he should ask the doctor for the order to leave the floor will continue to monitor

## 2017-06-02 NOTE — Progress Notes (Signed)
Patient discharge teaching given, including activity, diet, follow-up appoints, and medications. Patient verbalized understanding of all discharge instructions. IV access was d/c'd. Vitals are stable. Skin is intact except as charted in most recent assessments. Pt to be escorted out by NT, to be driven home by family. 

## 2017-06-02 NOTE — Progress Notes (Signed)
[facility] Progress Note     Subjective: CC:  Does reports intermittent RUQ pain, worse after fatty meals. Improved compared to day of admission. Reports diarrhea. Left the unit last night and told me he wants privileges to leave the floor to smoke cigarettes and cannabis. States cannabis is the only thing that helps his mental state.  Objective: Vital signs in last 24 hours: Temp:  [98 F (36.7 C)-98.9 F (37.2 C)] 98.6 F (37 C) (08/18 0504) Pulse Rate:  [59-74] 63 (08/18 0504) Resp:  [16-17] 17 (08/18 0504) BP: (129-140)/(72-78) 140/78 (08/18 0504) SpO2:  [97 %-100 %] 97 % (08/18 0504) Weight:  [88.4 kg (194 lb 12.8 oz)] 88.4 kg (194 lb 12.8 oz) (08/18 0504) Last BM Date: 06/01/17  Intake/Output from previous day: 08/17 0701 - 08/18 0700 In: 0  Out: 200 [Urine:200] Intake/Output this shift: No intake/output data recorded.  PE: Gen:  Alert, NAD, pleasant Card:  Regular rate and rhythm Pulm:  Normal effort Abd: Soft, non-tender, non-distended, bowel sounds present in all 4 quadrants, No guarding or peritonitis. Skin: warm and dry, no rashes  Psych: A&Ox3   Lab Results:   Recent Labs  05/31/17 1416 06/01/17 0543  WBC 10.8* 13.2*  HGB 19.5* 18.0*  HCT 55.9* 53.7*  PLT 313 311   BMET  Recent Labs  05/31/17 1416 06/01/17 0543  NA 139 139  K 4.5 3.6  CL 105 107  CO2 20* 24  GLUCOSE 148* 101*  BUN 8 9  CREATININE 0.96 0.91  CALCIUM 9.4 8.9   PT/INR No results for input(s): LABPROT, INR in the last 72 hours. CMP     Component Value Date/Time   NA 139 06/01/2017 0543   K 3.6 06/01/2017 0543   CL 107 06/01/2017 0543   CO2 24 06/01/2017 0543   GLUCOSE 101 (H) 06/01/2017 0543   BUN 9 06/01/2017 0543   CREATININE 0.91 06/01/2017 0543   CREATININE 1.17 04/14/2016 1002   CALCIUM 8.9 06/01/2017 0543   PROT 5.7 (L) 06/01/2017 0543   ALBUMIN 3.5 06/01/2017 0543   AST 16 06/01/2017 0543   ALT 15 (L) 06/01/2017 0543   ALKPHOS 73 06/01/2017  0543   BILITOT 1.0 06/01/2017 0543   GFRNONAA >60 06/01/2017 0543   GFRAA >60 06/01/2017 0543   Lipase     Component Value Date/Time   LIPASE 179 (H) 06/02/2017 0405       Studies/Results: Dg Skull 1-3 Views  Result Date: 05/31/2017 CLINICAL DATA:  61 y/o M; shunt series. Patient complains of nausea and dizziness. EXAM: SKULL - 1-3 VIEW; CHEST 1 VIEW; ABDOMEN - 1 VIEW; DG CERVICAL SPINE - 1 VIEW COMPARISON:  05/26/2017 CT head FINDINGS: Skull radiographs: Right parietal approach ventriculostomy catheter. The catheter tip crosses the midline. There is a reservoir in the right lateral convexity scalp. The catheter courses over the right lateral skull to the right anterior neck. No kink or discontinuity is identified along the visualized course of the catheter. Cervical spine radiograph: The catheter courses along the right anterior aspect of the neck. No kink or discontinuity is identified along the course of the catheter. Chest radiograph: The catheter courses along the right anterior chest wall. No kink or discontinuity is identified along the course of the catheter. Normal cardiac silhouette. Calcific aortic atherosclerosis. Clear lungs. Abdomen radiograph: The catheter coils in the upper abdomen, visualized portions of the catheter do not demonstrate a kink or discontinuity. 8 mm stone within the right kidney hilum.  Normal bowel gas pattern. IMPRESSION: Right parietal approach ventriculostomy catheter. The catheter extends along the right lateral skull, right anterior neck, right anterior chest, and coils in the upper abdomen. No kink or discontinuity of the catheter is identified. Electronically Signed   By: Kristine Garbe M.D.   On: 05/31/2017 18:55   Dg Chest 1 View  Result Date: 05/31/2017 CLINICAL DATA:  61 y/o M; shunt series. Patient complains of nausea and dizziness. EXAM: SKULL - 1-3 VIEW; CHEST 1 VIEW; ABDOMEN - 1 VIEW; DG CERVICAL SPINE - 1 VIEW COMPARISON:  05/26/2017 CT  head FINDINGS: Skull radiographs: Right parietal approach ventriculostomy catheter. The catheter tip crosses the midline. There is a reservoir in the right lateral convexity scalp. The catheter courses over the right lateral skull to the right anterior neck. No kink or discontinuity is identified along the visualized course of the catheter. Cervical spine radiograph: The catheter courses along the right anterior aspect of the neck. No kink or discontinuity is identified along the course of the catheter. Chest radiograph: The catheter courses along the right anterior chest wall. No kink or discontinuity is identified along the course of the catheter. Normal cardiac silhouette. Calcific aortic atherosclerosis. Clear lungs. Abdomen radiograph: The catheter coils in the upper abdomen, visualized portions of the catheter do not demonstrate a kink or discontinuity. 8 mm stone within the right kidney hilum.  Normal bowel gas pattern. IMPRESSION: Right parietal approach ventriculostomy catheter. The catheter extends along the right lateral skull, right anterior neck, right anterior chest, and coils in the upper abdomen. No kink or discontinuity of the catheter is identified. Electronically Signed   By: Kristine Garbe M.D.   On: 05/31/2017 18:55   Dg Cervical Spine 1 View  Result Date: 05/31/2017 CLINICAL DATA:  61 y/o M; shunt series. Patient complains of nausea and dizziness. EXAM: SKULL - 1-3 VIEW; CHEST 1 VIEW; ABDOMEN - 1 VIEW; DG CERVICAL SPINE - 1 VIEW COMPARISON:  05/26/2017 CT head FINDINGS: Skull radiographs: Right parietal approach ventriculostomy catheter. The catheter tip crosses the midline. There is a reservoir in the right lateral convexity scalp. The catheter courses over the right lateral skull to the right anterior neck. No kink or discontinuity is identified along the visualized course of the catheter. Cervical spine radiograph: The catheter courses along the right anterior aspect of the  neck. No kink or discontinuity is identified along the course of the catheter. Chest radiograph: The catheter courses along the right anterior chest wall. No kink or discontinuity is identified along the course of the catheter. Normal cardiac silhouette. Calcific aortic atherosclerosis. Clear lungs. Abdomen radiograph: The catheter coils in the upper abdomen, visualized portions of the catheter do not demonstrate a kink or discontinuity. 8 mm stone within the right kidney hilum.  Normal bowel gas pattern. IMPRESSION: Right parietal approach ventriculostomy catheter. The catheter extends along the right lateral skull, right anterior neck, right anterior chest, and coils in the upper abdomen. No kink or discontinuity of the catheter is identified. Electronically Signed   By: Kristine Garbe M.D.   On: 05/31/2017 18:55   Dg Abdomen 1 View  Result Date: 05/31/2017 CLINICAL DATA:  61 y/o M; shunt series. Patient complains of nausea and dizziness. EXAM: SKULL - 1-3 VIEW; CHEST 1 VIEW; ABDOMEN - 1 VIEW; DG CERVICAL SPINE - 1 VIEW COMPARISON:  05/26/2017 CT head FINDINGS: Skull radiographs: Right parietal approach ventriculostomy catheter. The catheter tip crosses the midline. There is a reservoir in the right lateral convexity  scalp. The catheter courses over the right lateral skull to the right anterior neck. No kink or discontinuity is identified along the visualized course of the catheter. Cervical spine radiograph: The catheter courses along the right anterior aspect of the neck. No kink or discontinuity is identified along the course of the catheter. Chest radiograph: The catheter courses along the right anterior chest wall. No kink or discontinuity is identified along the course of the catheter. Normal cardiac silhouette. Calcific aortic atherosclerosis. Clear lungs. Abdomen radiograph: The catheter coils in the upper abdomen, visualized portions of the catheter do not demonstrate a kink or  discontinuity. 8 mm stone within the right kidney hilum.  Normal bowel gas pattern. IMPRESSION: Right parietal approach ventriculostomy catheter. The catheter extends along the right lateral skull, right anterior neck, right anterior chest, and coils in the upper abdomen. No kink or discontinuity of the catheter is identified. Electronically Signed   By: Kristine Garbe M.D.   On: 05/31/2017 18:55   Ct Head Wo Contrast  Result Date: 05/31/2017 CLINICAL DATA:  Dizziness, nausea, shunt placed in November 2017, symptoms since, history hypertension EXAM: CT HEAD WITHOUT CONTRAST TECHNIQUE: Contiguous axial images were obtained from the base of the skull through the vertex without intravenous contrast. COMPARISON:  09/05/2016 FINDINGS: Brain: Intraventricular shunt with tip at the LEFT lateral ventricle posteriorly. CSF attenuation structure 2.3 x 2.1 x 1.9 cm at the aqueductal region remains dilated, previously measured 2.2 x 2.2 x 2.0 cm, with dorsal mass effect upon the pons RIGHT of midline. Ventricles otherwise normal morphology. No midline shift or additional mass effect. Otherwise normal appearance of brain parenchyma. No intracranial hemorrhage, mass lesion or evidence acute infarction. No extra-axial fluid collections. Vascular: Atherosclerotic calcifications of the internal carotid arteries bilaterally at the skullbase. Skull: Intact Sinuses/Orbits: Clear Other: N/A IMPRESSION: CSF attenuation structure at the aqueductal region appears grossly stable since the previous exam likely representing a cyst which had previously caused ventricular obstruction. No evidence of hydrocephalus post intraventricular shunt placement. No new/acute intracranial abnormalities. Electronically Signed   By: Lavonia Dana M.D.   On: 05/31/2017 16:52   US Abdomen Complete  Result Date: 05/31/2017 CLINICAL DATA:  61 y/o M; right upper quadrant abdominal pain for 1 day. History of kidney stones. EXAM: ABDOMEN ULTRASOUND  COMPLETE COMPARISON:  01/13/2015 CT abdomen and pelvis. 01/11/2015 abdomen ultrasound. FINDINGS: Gallbladder: 9.2 mm gallbladder wall thickening. Echogenic material within the lumen of the gallbladder a fixed of the wall may represent tumefactive sludge or a polyp measuring up to 1.5 cm. No pericholecystic fluid. Negative sonographic Murphy sign. Common bile duct: Diameter: 4.1 mm Liver: Mild heterogeneous liver echotexture with overall increased echogenicity compatible with steatosis. No focal lesion identified. IVC: No abnormality visualized. Pancreas: Visualized portion unremarkable. Spleen: Size and appearance within normal limits. Right Kidney: Length: 12.4 cm. Shadowing echogenic stone measuring 1.0 cm in the upper pole of the kidney. No hydronephrosis. Left Kidney: Length: 13.6 cm. Large simple appearing cysts measuring up to 7.9 cm is stable given differences in technique. No hydronephrosis. Abdominal aorta: No aneurysm visualized. Other findings: None. IMPRESSION: 1. Gallbladder wall thickening. No gallstone. No biliary ductal dilatation. Findings are nonspecific and may represent cholecystitis, reactive changes due to local inflammation (pancreas, liver, kidney), heart failure, or hypoproteinemia. 2. New echogenic material within the gallbladder fixed to the wall may represent tumefactive sludge or a polyp measuring up to 1.5 cm. 3. Hepatic steatosis. 4. Right kidney stone. 5. No hydronephrosis. 6. Stable left kidney simple appearing  cyst measuring up to 7.9 cm. Electronically Signed   By: Kristine Garbe M.D.   On: 05/31/2017 20:12   Nm Hepato W/eject Fract  Result Date: 06/01/2017 CLINICAL DATA:  Right upper quadrant pain with gallbladder wall thickening. EXAM: NUCLEAR MEDICINE HEPATOBILIARY IMAGING WITH GALLBLADDER EF TECHNIQUE: Sequential images of the abdomen were obtained out to 60 minutes following intravenous administration of radiopharmaceutical. After oral ingestion of 8 ounces of  Half-and-Half cream, gallbladder ejection fraction was determined. RADIOPHARMACEUTICALS:  5.24 mCi Tc-79m  Choletec IV COMPARISON:  None. FINDINGS: Prompt uptake and biliary excretion of activity by the liver is seen. Gallbladder activity is visualized, consistent with patency of cystic duct. Biliary activity passes into small bowel, consistent with patent common bile duct. Calculated gallbladder ejection fraction is 23%. (Normal gallbladder ejection fraction with half-and-half is greater than 33%.) IMPRESSION: 1. Normal hepatobiliary patency study with decreased gallbladder ejection fraction. 2. Patent cystic duct. Electronically Signed   By: Misty Stanley M.D.   On: 06/01/2017 13:00    Anti-infectives: Anti-infectives    None     Assessment/Plan RUQ pain Cholestasis  - pain consistent with worsening biliary colic - HIDA shows patent cystic duct but decreased EF of 23%.  - No acute surgical needs but I do think patient would benefit from gallbladder removal once medical co-morbidities are more stable. recommend outpatient follow up with Dr. Ninfa Linden for possible scheduling of elective cholecystectomy.      LOS: 1 day    [facility] 06/02/2017, 8:48 AM Pager: [phone] Consults: [phone] Mon-Fri 7:00 am-4:30 pm Sat-Sun 7:00 am-11:30 am

## 2017-06-02 NOTE — Discharge Instructions (Signed)
Follow with Primary MD  Amao, Enobong, MD  and other consultant's as instructed your Hospitalist MD ° °Please get a complete blood count and chemistry panel checked by your Primary MD at your next visit, and again as instructed by your Primary MD. ° °Get Medicines reviewed and adjusted: °Please take all your medications with you for your next visit with your Primary MD ° °Laboratory/radiological data: °Please request your Primary MD to go over all hospital tests and procedure/radiological results at the follow up, please ask your Primary MD to get all Hospital records sent to his/her office. ° °In some cases, they will be blood work, cultures and biopsy results pending at the time of your discharge. Please request that your primary care M.D. follows up on these results. ° °Also Note the following: °If you experience worsening of your admission symptoms, develop shortness of breath, life threatening emergency, suicidal or homicidal thoughts you must seek medical attention immediately by calling 911 or calling your MD immediately  if symptoms less severe. ° °You must read complete instructions/literature along with all the possible adverse reactions/side effects for all the Medicines you take and that have been prescribed to you. Take any new Medicines after you have completely understood and accpet all the possible adverse reactions/side effects.  ° °Do not drive when taking Pain medications or sleeping medications (Benzodaizepines) ° °Do not take more than prescribed Pain, Sleep and Anxiety Medications. It is not advisable to combine anxiety,sleep and pain medications without talking with your primary care practitioner ° °Special Instructions: If you have smoked or chewed Tobacco  in the last 2 yrs please stop smoking, stop any regular Alcohol  and or any Recreational drug use. ° °Wear Seat belts while driving. ° °Please note: °You were cared for by a hospitalist during your hospital stay. Once you are discharged,  your primary care physician will handle any further medical issues. Please note that NO REFILLS for any discharge medications will be authorized once you are discharged, as it is imperative that you return to your primary care physician (or establish a relationship with a primary care physician if you do not have one) for your post hospital discharge needs so that they can reassess your need for medications and monitor your lab values. ° ° ° ° °

## 2017-06-02 NOTE — Discharge Summary (Signed)
Physician Discharge Summary  Luis Henderson PYK:[id number] DOB: 08/17/1956 DOA: 05/31/2017  PCP: Arnoldo Morale, MD Surgeon: Dr. Ninfa Linden  Admit date: 05/31/2017 Discharge date: 06/02/2017  Admitted From: Home  Disposition: Home   Recommendations for Outpatient Follow-up:  1. Follow up with PCP in 1 weeks 2. Follow up with surgeon Dr. Ninfa Linden in 1 week to discuss cholecystectomy (elective) 3. Please obtain BMP/CBC in one week  Discharge Condition: STABLE   CODE STATUS: FULL    Brief Hospitalization Summary: Please see all hospital notes, images, labs for full details of the hospitalization.  HPI: Luis Henderson is a 61 y.o. male with medical history significant for obstructive hydrocephalus status post VP shunt placement in November 2017, hypertension, depression, and anxiety, now presenting to emergency department for evaluation of nausea and vomiting. Patient reports that he has been suffering from chronic dizziness and occasional nausea with vomiting since immediately after the shunt was placed in November. He typically feels better after vomiting, and did experience improvement yesterday after vomiting, but today he has had persistent nausea and vomiting without relief. He denies any abdominal pain. He denies diarrhea. Reports some possible chills, but no documented fevers.  ED Course: Upon arrival to the ED, patient is found to be afebrile, saturating well on room air, hypertensive to 200/105, and vitals otherwise stable. EKG features a normal sinus rhythm. Chemistry panels notable for a total bilirubin of 1.5. CBC features a slight leukocytosis to 10,800 and a stable polycythemia with hemoglobin of 19.5. Lactic acid is elevated to 3.03 and urinalysis is unremarkable. Lactic acid normalized to 1.90 after 1 L of normal saline. Noncontrast head CT was negative for new or acute abnormality. Radiographs of the skull through abdomen revealed appropriate positioning and appearance to his  VP shunt. Right upper quadrant ultrasound demonstrates nonspecific gallbladder wall thickening without gallbladder stone or ductal dilation identified. Patient was treated with 1 L of normal saline, 10 mg Norvasc, 300 mg irbesartan, and 4 mg IV Zofran in the ED. General surgery was consulted by the ED physician and advised a medical admission with HIDA scan.  MDM/Assessment & Plan:   1. Nausea &vomiting  - Pt presents with nausea and vomiting, reports frequent occurrence since VP shunt placed in Nov '17, but nausea not relieved with vomiting today as it usually is, prompting his presentation - No abd pain and no diarrhea  - ED workup included RUQ [place], which demonstrates GB wall-thickening without stone or ductal dilatation  - Gen surgery consulting and much appreciated, HIDA scan shows patent cystic duct but decreased EF of 23% - Treated with supportive care with IVF hydration and prn antiemetics - Pt will be discharged on low fat diet (gallbladder diet) From surgical team: RUQ pain Cholestasis  - pain consistent with worsening biliary colic - HIDA shows patent cystic duct but decreased EF of 23%.  - No acute surgical needs but I do think patient would benefit from gallbladder removal once medical co-morbidities are more stable. recommend outpatient follow up with Dr. Ninfa Linden for possible scheduling of elective cholecystectomy.   2. Polycythemia  - Hgb is 19.5 on admission, similar to recent priors  - Currently being worked up by PCP, discharge home on aspirin   3. Hydrocephalus s/p VP shunt  - Pt developed obstructive hydrocephalus last year and was treated with VP shunt in November 2017  - Head CT is stable on admission and no focal deficits identified  - Shunt appears appropriate on radiographs   4. Hypertension  with hypertensive urgency  - BP elevated to 200/100 range initially in ED  - Continue Norvasc and irbesartan - His BP was well controlled in hospital when taking the  medications  5. Depression, anxiety  - Continue Prozac   6. Tobacco Abuse - Pt asking to leave hospital to go outside and smoke, and he did leave the floor without permission as noted by rN.  Tobacco cessation counseling offered but says not ready to quit at this time really enjoys smoking. He is given information regarding tobacco cessation at discharge.   DVT prophylaxis:sq Lovenox  Code Status:Full  Family Communication:Discussed with patient Disposition Plan:Observe on med-surg Consults called:General surgery  Discharge Diagnoses:  Principal Problem:   Nausea and vomiting Active Problems:   HTN (hypertension)   Acquired obstructive hydrocephalus   Anxiety and depression   S/P VP shunt   Hypertensive urgency   Polycythemia   Nausea & vomiting  Discharge Instructions: Discharge Instructions    Call MD for:  difficulty breathing, headache or visual disturbances    Complete by:  As directed    Call MD for:  extreme fatigue    Complete by:  As directed    Call MD for:  persistant dizziness or light-headedness    Complete by:  As directed    Call MD for:  persistant nausea and vomiting    Complete by:  As directed    Call MD for:  severe uncontrolled pain    Complete by:  As directed    Call MD for:  temperature >100.4    Complete by:  As directed    Diet - low sodium heart healthy    Complete by:  As directed    Increase activity slowly    Complete by:  As directed      Allergies as of 06/02/2017      Reactions   Codeine Itching   Lisinopril Cough      Medication List    STOP taking these medications   traMADol 50 MG tablet Commonly known as:  ULTRAM     TAKE these medications   amLODipine 10 MG tablet Commonly known as:  NORVASC Take 1 tablet (10 mg total) by mouth daily.   aspirin EC 81 MG tablet Take 81 mg by mouth daily.   atorvastatin 20 MG tablet Commonly known as:  LIPITOR Take 1 tablet (20 mg total) by mouth daily.   FLUoxetine 20  MG tablet Commonly known as:  PROZAC Take 1 tablet (20 mg total) by mouth daily.   valsartan 320 MG tablet Commonly known as:  DIOVAN Take 1 tablet (320 mg total) by mouth daily.      Follow-up Information    Coralie Keens, MD. Schedule an appointment as soon as possible for a visit in 1 week(s).   Specialty:  General Surgery Why:  for surgical consultation regarding gallbladder surgery. Contact information: [place] [address] [phone]        Arnoldo Morale, MD. Schedule an appointment as soon as possible for a visit in 1 week(s).   Specialty:  Family Medicine Why:  Hospital Follow Up  Contact information: [address] [place] [address] [phone]          Allergies  Allergen Reactions  . Codeine Itching  . Lisinopril Cough   Current Discharge Medication List    CONTINUE these medications which have NOT CHANGED   Details  amLODipine (NORVASC) 10 MG tablet Take 1 tablet (  10 mg total) by mouth daily. Qty: 30 tablet, Refills: 5   Associated Diagnoses: Essential hypertension    aspirin EC 81 MG tablet Take 81 mg by mouth daily.    atorvastatin (LIPITOR) 20 MG tablet Take 1 tablet (20 mg total) by mouth daily. Qty: 30 tablet, Refills: 3   Associated Diagnoses: Pure hypercholesterolemia    FLUoxetine (PROZAC) 20 MG tablet Take 1 tablet (20 mg total) by mouth daily. Qty: 30 tablet, Refills: 3    valsartan (DIOVAN) 320 MG tablet Take 1 tablet (320 mg total) by mouth daily. Qty: 30 tablet, Refills: 3   Associated Diagnoses: Essential hypertension      STOP taking these medications     traMADol (ULTRAM) 50 MG tablet        Procedures/Studies: Dg Skull 1-3 Views  Result Date: 05/31/2017 CLINICAL DATA:  61 y/o M; shunt series. Patient complains of nausea and dizziness. EXAM: SKULL - 1-3 VIEW; CHEST 1 VIEW; ABDOMEN - 1 VIEW; DG CERVICAL SPINE - 1 VIEW COMPARISON:  05/26/2017 CT head FINDINGS: Skull radiographs:  Right parietal approach ventriculostomy catheter. The catheter tip crosses the midline. There is a reservoir in the right lateral convexity scalp. The catheter courses over the right lateral skull to the right anterior neck. No kink or discontinuity is identified along the visualized course of the catheter. Cervical spine radiograph: The catheter courses along the right anterior aspect of the neck. No kink or discontinuity is identified along the course of the catheter. Chest radiograph: The catheter courses along the right anterior chest wall. No kink or discontinuity is identified along the course of the catheter. Normal cardiac silhouette. Calcific aortic atherosclerosis. Clear lungs. Abdomen radiograph: The catheter coils in the upper abdomen, visualized portions of the catheter do not demonstrate a kink or discontinuity. 8 mm stone within the right kidney hilum.  Normal bowel gas pattern. IMPRESSION: Right parietal approach ventriculostomy catheter. The catheter extends along the right lateral skull, right anterior neck, right anterior chest, and coils in the upper abdomen. No kink or discontinuity of the catheter is identified. Electronically Signed   By: Kristine Garbe M.D.   On: 05/31/2017 18:55   Dg Chest 1 View  Result Date: 05/31/2017 CLINICAL DATA:  61 y/o M; shunt series. Patient complains of nausea and dizziness. EXAM: SKULL - 1-3 VIEW; CHEST 1 VIEW; ABDOMEN - 1 VIEW; DG CERVICAL SPINE - 1 VIEW COMPARISON:  05/26/2017 CT head FINDINGS: Skull radiographs: Right parietal approach ventriculostomy catheter. The catheter tip crosses the midline. There is a reservoir in the right lateral convexity scalp. The catheter courses over the right lateral skull to the right anterior neck. No kink or discontinuity is identified along the visualized course of the catheter. Cervical spine radiograph: The catheter courses along the right anterior aspect of the neck. No kink or discontinuity is identified  along the course of the catheter. Chest radiograph: The catheter courses along the right anterior chest wall. No kink or discontinuity is identified along the course of the catheter. Normal cardiac silhouette. Calcific aortic atherosclerosis. Clear lungs. Abdomen radiograph: The catheter coils in the upper abdomen, visualized portions of the catheter do not demonstrate a kink or discontinuity. 8 mm stone within the right kidney hilum.  Normal bowel gas pattern. IMPRESSION: Right parietal approach ventriculostomy catheter. The catheter extends along the right lateral skull, right anterior neck, right anterior chest, and coils in the upper abdomen. No kink or discontinuity of the catheter is identified. Electronically Signed   By:  Kristine Garbe M.D.   On: 05/31/2017 18:55   Dg Cervical Spine 1 View  Result Date: 05/31/2017 CLINICAL DATA:  61 y/o M; shunt series. Patient complains of nausea and dizziness. EXAM: SKULL - 1-3 VIEW; CHEST 1 VIEW; ABDOMEN - 1 VIEW; DG CERVICAL SPINE - 1 VIEW COMPARISON:  05/26/2017 CT head FINDINGS: Skull radiographs: Right parietal approach ventriculostomy catheter. The catheter tip crosses the midline. There is a reservoir in the right lateral convexity scalp. The catheter courses over the right lateral skull to the right anterior neck. No kink or discontinuity is identified along the visualized course of the catheter. Cervical spine radiograph: The catheter courses along the right anterior aspect of the neck. No kink or discontinuity is identified along the course of the catheter. Chest radiograph: The catheter courses along the right anterior chest wall. No kink or discontinuity is identified along the course of the catheter. Normal cardiac silhouette. Calcific aortic atherosclerosis. Clear lungs. Abdomen radiograph: The catheter coils in the upper abdomen, visualized portions of the catheter do not demonstrate a kink or discontinuity. 8 mm stone within the right kidney  hilum.  Normal bowel gas pattern. IMPRESSION: Right parietal approach ventriculostomy catheter. The catheter extends along the right lateral skull, right anterior neck, right anterior chest, and coils in the upper abdomen. No kink or discontinuity of the catheter is identified. Electronically Signed   By: Kristine Garbe M.D.   On: 05/31/2017 18:55   Dg Abdomen 1 View  Result Date: 05/31/2017 CLINICAL DATA:  61 y/o M; shunt series. Patient complains of nausea and dizziness. EXAM: SKULL - 1-3 VIEW; CHEST 1 VIEW; ABDOMEN - 1 VIEW; DG CERVICAL SPINE - 1 VIEW COMPARISON:  05/26/2017 CT head FINDINGS: Skull radiographs: Right parietal approach ventriculostomy catheter. The catheter tip crosses the midline. There is a reservoir in the right lateral convexity scalp. The catheter courses over the right lateral skull to the right anterior neck. No kink or discontinuity is identified along the visualized course of the catheter. Cervical spine radiograph: The catheter courses along the right anterior aspect of the neck. No kink or discontinuity is identified along the course of the catheter. Chest radiograph: The catheter courses along the right anterior chest wall. No kink or discontinuity is identified along the course of the catheter. Normal cardiac silhouette. Calcific aortic atherosclerosis. Clear lungs. Abdomen radiograph: The catheter coils in the upper abdomen, visualized portions of the catheter do not demonstrate a kink or discontinuity. 8 mm stone within the right kidney hilum.  Normal bowel gas pattern. IMPRESSION: Right parietal approach ventriculostomy catheter. The catheter extends along the right lateral skull, right anterior neck, right anterior chest, and coils in the upper abdomen. No kink or discontinuity of the catheter is identified. Electronically Signed   By: Kristine Garbe M.D.   On: 05/31/2017 18:55   Ct Head Wo Contrast  Result Date: 05/31/2017 CLINICAL DATA:  Dizziness,  nausea, shunt placed in November 2017, symptoms since, history hypertension EXAM: CT HEAD WITHOUT CONTRAST TECHNIQUE: Contiguous axial images were obtained from the base of the skull through the vertex without intravenous contrast. COMPARISON:  09/05/2016 FINDINGS: Brain: Intraventricular shunt with tip at the LEFT lateral ventricle posteriorly. CSF attenuation structure 2.3 x 2.1 x 1.9 cm at the aqueductal region remains dilated, previously measured 2.2 x 2.2 x 2.0 cm, with dorsal mass effect upon the pons RIGHT of midline. Ventricles otherwise normal morphology. No midline shift or additional mass effect. Otherwise normal appearance of brain parenchyma. No intracranial  hemorrhage, mass lesion or evidence acute infarction. No extra-axial fluid collections. Vascular: Atherosclerotic calcifications of the internal carotid arteries bilaterally at the skullbase. Skull: Intact Sinuses/Orbits: Clear Other: N/A IMPRESSION: CSF attenuation structure at the aqueductal region appears grossly stable since the previous exam likely representing a cyst which had previously caused ventricular obstruction. No evidence of hydrocephalus post intraventricular shunt placement. No new/acute intracranial abnormalities. Electronically Signed   By: Lavonia [place] M.D.   On: 05/31/2017 16:52   US Abdomen Complete  Result Date: 05/31/2017 CLINICAL DATA:  61 y/o M; right upper quadrant abdominal pain for 1 day. History of kidney stones. EXAM: ABDOMEN ULTRASOUND COMPLETE COMPARISON:  01/13/2015 CT abdomen and pelvis. 01/11/2015 abdomen ultrasound. FINDINGS: Gallbladder: 9.2 mm gallbladder wall thickening. Echogenic material within the lumen of the gallbladder a fixed of the wall may represent tumefactive sludge or a polyp measuring up to 1.5 cm. No pericholecystic fluid. Negative sonographic Murphy sign. Common bile duct: Diameter: 4.1 mm Liver: Mild heterogeneous liver echotexture with overall increased echogenicity compatible with  steatosis. No focal lesion identified. IVC: No abnormality visualized. Pancreas: Visualized portion unremarkable. Spleen: Size and appearance within normal limits. Right Kidney: Length: 12.4 cm. Shadowing echogenic stone measuring 1.0 cm in the upper pole of the kidney. No hydronephrosis. Left Kidney: Length: 13.6 cm. Large simple appearing cysts measuring up to 7.9 cm is stable given differences in technique. No hydronephrosis. Abdominal aorta: No aneurysm visualized. Other findings: None. IMPRESSION: 1. Gallbladder wall thickening. No gallstone. No biliary ductal dilatation. Findings are nonspecific and may represent cholecystitis, reactive changes due to local inflammation (pancreas, liver, kidney), heart failure, or hypoproteinemia. 2. New echogenic material within the gallbladder fixed to the wall may represent tumefactive sludge or a polyp measuring up to 1.5 cm. 3. Hepatic steatosis. 4. Right kidney stone. 5. No hydronephrosis. 6. Stable left kidney simple appearing cyst measuring up to 7.9 cm. Electronically Signed   By: Kristine Garbe M.D.   On: 05/31/2017 20:12   Nm Hepato W/eject Fract  Result Date: 06/01/2017 CLINICAL DATA:  Right upper quadrant pain with gallbladder wall thickening. EXAM: NUCLEAR MEDICINE HEPATOBILIARY IMAGING WITH GALLBLADDER EF TECHNIQUE: Sequential images of the abdomen were obtained out to 60 minutes following intravenous administration of radiopharmaceutical. After oral ingestion of 8 ounces of Half-and-Half cream, gallbladder ejection fraction was determined. RADIOPHARMACEUTICALS:  5.24 mCi Tc-37m  Choletec IV COMPARISON:  None. FINDINGS: Prompt uptake and biliary excretion of activity by the liver is seen. Gallbladder activity is visualized, consistent with patency of cystic duct. Biliary activity passes into small bowel, consistent with patent common bile duct. Calculated gallbladder ejection fraction is 23%. (Normal gallbladder ejection fraction with half-and-half  is greater than 33%.) IMPRESSION: 1. Normal hepatobiliary patency study with decreased gallbladder ejection fraction. 2. Patent cystic duct. Electronically Signed   By: Misty Stanley M.D.   On: 06/01/2017 13:00      Subjective: Pt was seen and reports that abd pain is a lot better and he is eating.  He left floor to go outside and smoke without permission.   Discharge Exam: Vitals:   06/02/17 1045 06/02/17 1357  BP: 135/80 139/77  Pulse:  77  Resp:  18  Temp:  98.6 F (37 C)  SpO2:  100%   Vitals:   06/02/17 0504 06/02/17 0504 06/02/17 1045 06/02/17 1357  BP:  140/78 135/80 139/77  Pulse:  63  77  Resp:  17  18  Temp:  98.6 F (37 C)  98.6 F (37 C)  TempSrc:  Oral  Oral  SpO2:  97%  100%  Weight: 88.4 kg (194 lb 12.8 oz)     Height:       General: Pt is alert, awake, not in acute distress Cardiovascular: RRR, S1/S2 +, no rubs, no gallops Respiratory: CTA bilaterally, no wheezing, no rhonchi Abdominal: Soft, NT, ND, bowel sounds + Extremities: no edema, no cyanosis   The results of significant diagnostics from this hospitalization (including imaging, microbiology, ancillary and laboratory) are listed below for reference.     Microbiology: No results found for this or any previous visit (from the past 240 hour(s)).   Labs: BNP (last 3 results) No results for input(s): BNP in the last 8760 hours. Basic Metabolic Panel:  Recent Labs Lab 05/31/17 1416 06/01/17 0543 06/02/17 1017  NA 139 139 138  K 4.5 3.6 4.1  CL 105 107 106  CO2 20* 24 23  GLUCOSE 148* 101* 138*  BUN 8 9 14   CREATININE 0.96 0.91 1.04  CALCIUM 9.4 8.9 9.3   Liver Function Tests:  Recent Labs Lab 05/31/17 1416 06/01/17 0543 06/02/17 1017  AST 27 16 20   ALT 17 15* 17  ALKPHOS 82 73 73  BILITOT 1.5* 1.0 1.1  PROT 6.7 5.7* 6.6  ALBUMIN 4.3 3.5 4.1    Recent Labs Lab 05/31/17 1416 06/02/17 0405  LIPASE 44 179*   No results for input(s): AMMONIA in the last 168  hours. CBC:  Recent Labs Lab 05/31/17 1416 06/01/17 0543 06/02/17 1017  WBC 10.8* 13.2* 11.6*  NEUTROABS 8.9* 8.8* 7.5  HGB 19.5* 18.0* 18.6*  HCT 55.9* 53.7* 55.1*  MCV 86.7 86.8 87.5  PLT 313 311 297   Cardiac Enzymes: No results for input(s): CKTOTAL, CKMB, CKMBINDEX, TROPONINI in the last 168 hours. BNP: Invalid input(s): POCBNP CBG: No results for input(s): GLUCAP in the last 168 hours. D-Dimer No results for input(s): DDIMER in the last 72 hours. Hgb A1c No results for input(s): HGBA1C in the last 72 hours. Lipid Profile No results for input(s): CHOL, HDL, LDLCALC, TRIG, CHOLHDL, LDLDIRECT in the last 72 hours. Thyroid function studies No results for input(s): TSH, T4TOTAL, T3FREE, THYROIDAB in the last 72 hours.  Invalid input(s): FREET3 Anemia work up No results for input(s): VITAMINB12, FOLATE, FERRITIN, TIBC, IRON, RETICCTPCT in the last 72 hours. Urinalysis    Component Value Date/Time   COLORURINE STRAW (A) 05/31/2017 1644   APPEARANCEUR CLEAR 05/31/2017 1644   LABSPEC 1.010 05/31/2017 1644   PHURINE 8.0 05/31/2017 1644   GLUCOSEU 50 (A) 05/31/2017 1644   HGBUR NEGATIVE 05/31/2017 [place] 05/31/2017 [place] 05/31/2017 1644   PROTEINUR 30 (A) 05/31/2017 1644   UROBILINOGEN 0.2 05/07/2017 1311   NITRITE NEGATIVE 05/31/2017 1644   LEUKOCYTESUR NEGATIVE 05/31/2017 1644   Sepsis Labs Invalid input(s): PROCALCITONIN,  WBC,  LACTICIDVEN Microbiology No results found for this or any previous visit (from the past 240 hour(s)).  Time coordinating discharge: 33 minutes  SIGNED:  Irwin Brakeman, MD  [facility] 06/02/2017, 2:24 PM Pager [phone]  If 7PM-7AM, please contact night-coverage [email or web address] Password [email or web address]

## 2017-08-09 ENCOUNTER — Encounter (HOSPITAL_COMMUNITY): Payer: Self-pay | Admitting: Emergency Medicine

## 2017-08-09 ENCOUNTER — Ambulatory Visit (HOSPITAL_COMMUNITY)
Admission: EM | Admit: 2017-08-09 | Discharge: 2017-08-09 | Disposition: A | Discharge status: Home / Self Care | Payer: Self-pay | Attending: Internal Medicine | Admitting: Internal Medicine

## 2017-08-09 DIAGNOSIS — Z7982 Long term (current) use of aspirin: Secondary | ICD-10-CM | POA: Insufficient documentation

## 2017-08-09 DIAGNOSIS — R531 Weakness: Secondary | ICD-10-CM

## 2017-08-09 DIAGNOSIS — Z79899 Other long term (current) drug therapy: Secondary | ICD-10-CM | POA: Insufficient documentation

## 2017-08-09 DIAGNOSIS — R42 Dizziness and giddiness: Secondary | ICD-10-CM

## 2017-08-09 DIAGNOSIS — Z862 Personal history of diseases of the blood and blood-forming organs and certain disorders involving the immune mechanism: Secondary | ICD-10-CM

## 2017-08-09 DIAGNOSIS — D45 Polycythemia vera: Secondary | ICD-10-CM | POA: Insufficient documentation

## 2017-08-09 DIAGNOSIS — F17210 Nicotine dependence, cigarettes, uncomplicated: Secondary | ICD-10-CM | POA: Insufficient documentation

## 2017-08-09 LAB — CBC WITH DIFFERENTIAL/PLATELET
BASOS ABS: 0.1 10*3/uL (ref 0.0–0.1)
Basophils Relative: 1 %
EOS PCT: 3 %
Eosinophils Absolute: 0.3 10*3/uL (ref 0.0–0.7)
HCT: 51.7 % (ref 39.0–52.0)
HEMOGLOBIN: 17.4 g/dL — AB (ref 13.0–17.0)
LYMPHS ABS: 2.7 10*3/uL (ref 0.7–4.0)
LYMPHS PCT: 28 %
MCH: 30.6 pg (ref 26.0–34.0)
MCHC: 33.7 g/dL (ref 30.0–36.0)
MCV: 91 fL (ref 78.0–100.0)
MONO ABS: 1.2 10*3/uL — AB (ref 0.1–1.0)
MONOS PCT: 12 %
Neutro Abs: 5.4 10*3/uL (ref 1.7–7.7)
Neutrophils Relative %: 56 %
PLATELETS: 298 10*3/uL (ref 150–400)
RBC: 5.68 MIL/uL (ref 4.22–5.81)
RDW: 14.9 % (ref 11.5–15.5)
WBC: 9.6 10*3/uL (ref 4.0–10.5)

## 2017-08-09 NOTE — ED Provider Notes (Signed)
[place]    CSN: [id number] Arrival date & time: 08/09/17  1424     History   Chief Complaint Chief Complaint  Patient presents with  . Fatigue    HPI Luis Henderson is a 61 y.o. male.   Subjective:   Luis Henderson is a 61 y.o. 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 He denies any chest pain, melena or rectal bleeding. He was initially diagnosed with polycythemia at this facility in July 2018. He subsequently followed up with his PCP and was supposed to follow-up with heme/onc but never did.  The following portions of the patient's history were reviewed and updated as appropriate: allergies, current medications, past family history, past medical history, past social history, past surgical history and problem list.        Past Medical History:  Diagnosis Date  . Anxiety   . Depression   . History of prediabetes   . Hyperlipidemia   . Hypertension     Patient Active Problem List   Diagnosis Date Noted  . Hypertensive urgency 05/31/2017  . Nausea and vomiting 05/31/2017  . Polycythemia 05/31/2017  . Nausea & vomiting 05/31/2017  . 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 Falls   . Anxiety and depression   . Chronic bilateral low back pain without sciatica   . ETOH abuse   . Tobacco abuse   . HLD (hyperlipidemia) 09/04/2016  . 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 Abnormal gait 09/03/2016  . Acute encephalopathy 09/03/2016  . Low back pain 06/09/2016  . HTN (hypertension) 04/01/2015    Past Surgical History:  Procedure Laterality Date  . CYSTOSCOPY WITH RETROGRADE PYELOGRAM, URETEROSCOPY AND STENT PLACEMENT Right 01/15/2015   Procedure: CYSTOSCOPY, RIGHT URETEROSCOPY/ RETROGRADE PYELOGRAM,HOLMIUM LASER/ LITHOTRIPSY, STENT PLACEMENT, URETERAL BALLOON DIALATION;  Surgeon: Kathie Rhodes, MD;  Location:  [facility];  Service: Urology;  Laterality: Right;  . SKIN GRAFT     left hand  . VENTRICULOPERITONEAL SHUNT Right 09/04/2016   Procedure: SHUNT INSERTION VENTRICULAR-PERITONEAL;  Surgeon: Newman Pies, MD;  Location: [facility];  Service: Neurosurgery;  Laterality: Right;  . WISDOM TOOTH EXTRACTION         Home Medications    Prior to Admission medications   Medication Sig Start Date End Date Taking? Authorizing Provider  amLODipine (NORVASC) 10 MG tablet Take 1 tablet (10 mg total) by mouth daily. 05/09/17   Arnoldo Morale, MD  aspirin EC 81 MG tablet Take 81 mg by mouth daily.    [provider]  atorvastatin (LIPITOR) 20 MG tablet Take 1 tablet (20 mg total) by mouth daily. 05/09/17   Arnoldo Morale, MD  FLUoxetine (PROZAC) 20 MG tablet Take 1 tablet (20 mg total) by mouth daily. 05/09/17   Arnoldo Morale, MD  valsartan (DIOVAN) 320 MG tablet Take 1 tablet (320 mg total) by mouth daily. 05/09/17   Arnoldo Morale, MD    Family History Family History  Problem Relation Age of Onset  . Hyperlipidemia Father     Social History Social History  Substance Use Topics  . Smoking status: Current Every Day Smoker    Packs/day: 0.50    Years: 28.00    Types: Cigarettes    Last attempt to quit: 10/17/1999  . Smokeless tobacco: Never Used  . Alcohol use 3.0 - 3.6 oz/week    5 - 6 Cans of beer per week  Comment: daily alcohol/drinks beer, wine, and liquor     Allergies   Codeine and Lisinopril   Review of Systems Review of Systems  Constitutional: Positive for fatigue.  Respiratory: Positive for shortness of breath.   Cardiovascular: Negative for chest pain.  Gastrointestinal: Negative for blood in stool.  Neurological: Positive for light-headedness.     Physical Exam Triage Vital Signs ED Triage Vitals [08/09/17 1513]  Enc Vitals Group     BP (!) 152/85     Pulse Rate 70     Resp 18     Temp (!) 97.5 F (36.4 C)     Temp Source Oral     SpO2 99 %     Weight       Height      Head Circumference      Peak Flow      Pain Score      Pain Loc      Pain Edu?      Excl. in [place]?    No data found.   Updated Vital Signs BP (!) 152/85 (BP Location: Left Arm) Comment: took blood pressure medicine this morning  Pulse 70   Temp (!) 97.5 F (36.4 C) (Oral)   Resp 18   SpO2 99%   Visual Acuity Right Eye Distance:   Left Eye Distance:   Bilateral Distance:    Right Eye Near:   Left Eye Near:    Bilateral Near:     Physical Exam  Constitutional: He is oriented to person, place, and time. He appears well-developed and well-nourished.  Cardiovascular: Normal rate and regular rhythm.   Pulmonary/Chest: Effort normal and breath sounds normal.  Musculoskeletal: Normal range of motion.  Neurological: He is alert and oriented to person, place, and time.  Skin: Skin is warm and dry.     UC Treatments / Results  Labs (all labs ordered are listed, but only abnormal results are displayed) Labs Reviewed  CBC WITH DIFFERENTIAL/PLATELET    EKG  EKG Interpretation None       Radiology No results found.  Procedures Procedures (including critical care time)  Medications Ordered in UC Medications - No data to display   Initial Impression / Assessment and Plan / UC Course  I have reviewed the triage vital signs and the nursing notes.  Pertinent labs & imaging results that were available during my care of the patient were reviewed by me and considered in my medical decision making (see chart for details).     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 He endorses dizziness/lightheadedness, fatigue and mild dyspnea with exertion. He has seen his PCP back in July for the same but has failed to follow-up with heme/onc. We will check a CBC with differential here in the clinic and notify patient of the results. A heme/onc referral was given and patient was  strongly encouraged to call and make an appointment for follow-up and further workup.   Discussed diagnosis and treatment with patient. All questions have been answered and all concerns have been addressed. The patient verbalized understanding and had no further questions   Final Clinical Impressions(s) / UC Diagnoses   Final diagnoses:  History of polycythemia vera  Weakness    New Prescriptions New Prescriptions   No medications on file     Controlled Substance Prescriptions [place] Controlled Substance Registry consulted? Not Applicable   Enrique Sack, [place] 08/09/17  1541  

## 2017-08-09 NOTE — ED Triage Notes (Signed)
Fatigue for 4 months and requesting hgb be checked today

## 2017-09-28 ENCOUNTER — Encounter (HOSPITAL_COMMUNITY): Payer: Self-pay | Admitting: *Deleted

## 2017-09-28 ENCOUNTER — Emergency Department (HOSPITAL_COMMUNITY)
Admission: EM | Admit: 2017-09-28 | Discharge: 2017-09-29 | Disposition: A | Discharge status: Home / Self Care | Payer: Self-pay | Attending: Emergency Medicine | Admitting: Emergency Medicine

## 2017-09-28 ENCOUNTER — Other Ambulatory Visit: Payer: Self-pay

## 2017-09-28 DIAGNOSIS — Y929 Unspecified place or not applicable: Secondary | ICD-10-CM | POA: Insufficient documentation

## 2017-09-28 DIAGNOSIS — Z7982 Long term (current) use of aspirin: Secondary | ICD-10-CM | POA: Insufficient documentation

## 2017-09-28 DIAGNOSIS — Y998 Other external cause status: Secondary | ICD-10-CM | POA: Insufficient documentation

## 2017-09-28 DIAGNOSIS — I10 Essential (primary) hypertension: Secondary | ICD-10-CM | POA: Insufficient documentation

## 2017-09-28 DIAGNOSIS — Z79899 Other long term (current) drug therapy: Secondary | ICD-10-CM | POA: Insufficient documentation

## 2017-09-28 DIAGNOSIS — D45 Polycythemia vera: Secondary | ICD-10-CM | POA: Insufficient documentation

## 2017-09-28 DIAGNOSIS — F419 Anxiety disorder, unspecified: Secondary | ICD-10-CM | POA: Insufficient documentation

## 2017-09-28 DIAGNOSIS — Y9389 Activity, other specified: Secondary | ICD-10-CM | POA: Insufficient documentation

## 2017-09-28 DIAGNOSIS — R0602 Shortness of breath: Secondary | ICD-10-CM | POA: Insufficient documentation

## 2017-09-28 DIAGNOSIS — F17210 Nicotine dependence, cigarettes, uncomplicated: Secondary | ICD-10-CM | POA: Insufficient documentation

## 2017-09-28 DIAGNOSIS — W57XXXA Bitten or stung by nonvenomous insect and other nonvenomous arthropods, initial encounter: Secondary | ICD-10-CM | POA: Insufficient documentation

## 2017-09-28 DIAGNOSIS — F329 Major depressive disorder, single episode, unspecified: Secondary | ICD-10-CM | POA: Insufficient documentation

## 2017-09-28 DIAGNOSIS — S30861A Insect bite (nonvenomous) of abdominal wall, initial encounter: Secondary | ICD-10-CM | POA: Insufficient documentation

## 2017-09-28 LAB — URINALYSIS, ROUTINE W REFLEX MICROSCOPIC
BILIRUBIN URINE: NEGATIVE
Glucose, UA: NEGATIVE mg/dL
Hgb urine dipstick: NEGATIVE
KETONES UR: 5 mg/dL — AB
Nitrite: NEGATIVE
PH: 5 (ref 5.0–8.0)
PROTEIN: 100 mg/dL — AB
SQUAMOUS EPITHELIAL / LPF: NONE SEEN
Specific Gravity, Urine: 1.028 (ref 1.005–1.030)

## 2017-09-28 LAB — COMPREHENSIVE METABOLIC PANEL
ALK PHOS: 77 U/L (ref 38–126)
ALT: 20 U/L (ref 17–63)
AST: 25 U/L (ref 15–41)
Albumin: 4.3 g/dL (ref 3.5–5.0)
Anion gap: 15 (ref 5–15)
BUN: 20 mg/dL (ref 6–20)
CHLORIDE: 99 mmol/L — AB (ref 101–111)
CO2: 19 mmol/L — AB (ref 22–32)
Calcium: 9.8 mg/dL (ref 8.9–10.3)
Creatinine, Ser: 1.17 mg/dL (ref 0.61–1.24)
GLUCOSE: 131 mg/dL — AB (ref 65–99)
Potassium: 3.4 mmol/L — ABNORMAL LOW (ref 3.5–5.1)
Sodium: 133 mmol/L — ABNORMAL LOW (ref 135–145)
Total Bilirubin: 1 mg/dL (ref 0.3–1.2)
Total Protein: 6.9 g/dL (ref 6.5–8.1)

## 2017-09-28 LAB — CBC
HEMATOCRIT: 56.1 % — AB (ref 39.0–52.0)
HEMOGLOBIN: 19.7 g/dL — AB (ref 13.0–17.0)
MCH: 31.1 pg (ref 26.0–34.0)
MCHC: 35.1 g/dL (ref 30.0–36.0)
MCV: 88.6 fL (ref 78.0–100.0)
Platelets: 362 10*3/uL (ref 150–400)
RBC: 6.33 MIL/uL — ABNORMAL HIGH (ref 4.22–5.81)
RDW: 14 % (ref 11.5–15.5)
WBC: 15.4 10*3/uL — ABNORMAL HIGH (ref 4.0–10.5)

## 2017-09-28 LAB — I-STAT TROPONIN, ED: Troponin i, poc: 0 ng/mL (ref 0.00–0.08)

## 2017-09-28 LAB — LIPASE, BLOOD: LIPASE: 45 U/L (ref 11–51)

## 2017-09-28 MED ORDER — ONDANSETRON HCL 4 MG/2ML IJ SOLN
4.0000 mg | Freq: Once | INTRAMUSCULAR | Status: DC
  Filled 2017-09-28: qty 2

## 2017-09-28 MED ORDER — ONDANSETRON 4 MG PO TBDP
ORAL_TABLET | ORAL | Status: AC
  Administered 2017-09-28: 4 mg
  Filled 2017-09-28: qty 1

## 2017-09-28 NOTE — ED Notes (Signed)
Family up to nurse first desk stating that patient is having a hard time breathing. Debra NS and I asked Josh EMT to pull patient back to be reevaluated in triage.

## 2017-09-28 NOTE — ED Triage Notes (Signed)
Pt c/o SOB onset 2-3 wks  Pt concerned these symptoms may be r/t polycythemia diagnosis this year, pt c/o fluctuating BP, pt denies CP, pt c/o vomiting onset x 3 days, pt reports x 3 episodes in the last 24 hrs, pt denies diarrhea, pt c/o constipation, pt A&O x4, pt reports tick removal x 3 wks with red spot on RLQ

## 2017-09-28 NOTE — ED Notes (Addendum)
Pt in triage room screaming at the top of his lungs "I'm going to get a pulmonary embolism if someone doesn't get in here." This RN entered room, pt continued to yell that he has been short of breath for hours. Pt explained his history, symptoms while yelling at RN in triage room. Once pt finished yelling, this RN introduced herself and explained to patient that a room is being prepared for him and we are waiting for it to be cleaned. Apologized for patient for wait times and his s/s, explained to patient that I understand that he is not feeling well and I would go and clean his room myself. Pt continuing to yell that he does not mean to be mean, that he likes Josh and this Probation officer "but nurses are not doing me any good."   RN departed room and cleaned pt's treatment room then returned.  Pt then complaining of R hand paralysis, stating "I just had a TIA, there's plenty of evidence to get me back to room." RN informed patient that she agreed which was why he was being escorted to room. Pt stated "after I"ve been here for 5 hours." RN escorted pt to room and hooked up to cardiac monitor, provided blanket and socks according to fall risk. Pt unsteady on his feet when ambulating, requires 1 assist. Pt then states "now how long is it gonna be for the doctor comes in." Explained to patient that MD would be in shortly as long at MD was not with a trauma or critically ill patient. Apologized multiple times to patient for his wait times and discomfort.  Notified Insurance risk surveyor of patient's behaviors, likely does not need further charge RN interventions at this time. Primary RN Candace and Dr Stark Jock notified of patient's behaviors.

## 2017-09-28 NOTE — ED Notes (Signed)
Pt on his knees in lobby. Upset that he has been in lobby for 4 hours with [facility]. No injury noted.

## 2017-09-29 ENCOUNTER — Emergency Department (HOSPITAL_COMMUNITY): Payer: Self-pay

## 2017-09-29 ENCOUNTER — Encounter (HOSPITAL_COMMUNITY): Payer: Self-pay | Admitting: *Deleted

## 2017-09-29 MED ORDER — SODIUM CHLORIDE 0.9 % IV BOLUS (SEPSIS)
1000.0000 mL | Freq: Once | INTRAVENOUS | Status: AC
  Administered 2017-09-29: 1000 mL via INTRAVENOUS

## 2017-09-29 MED ORDER — IOPAMIDOL (ISOVUE-370) INJECTION 76%
INTRAVENOUS | Status: AC
  Administered 2017-09-29: 100 mL
  Filled 2017-09-29: qty 100

## 2017-09-29 MED ORDER — DOXYCYCLINE HYCLATE 100 MG PO CAPS: 100.0000 mg | ORAL_CAPSULE | Freq: Two times a day (BID) | ORAL | 0 refills | Status: DC

## 2017-09-29 MED ORDER — ONDANSETRON HCL 4 MG/2ML IJ SOLN
INTRAMUSCULAR | Status: AC
  Administered 2017-09-29: 4 mg
  Filled 2017-09-29: qty 2

## 2017-09-29 NOTE — ED Provider Notes (Addendum)
[facility] EMERGENCY DEPARTMENT Provider Note   CSN: [id number] Arrival date & time: 09/28/17  [place]     History   Chief Complaint Chief Complaint  Patient presents with  . Shortness of Breath    HPI Luis Henderson is a 61 y.o. male.  Patient is a 61 year old male with past medical history of polycythemia vera, anxiety, hypertension, high cholesterol, VP shunt placement for hydrocephalus.  He presents today with multiple complaints.  He originally presented here complaining that he has been short of breath and that this is been worsening for the past several weeks.  He does report intermittent fevers or chills.  He denies any productive cough.  He also reports that he had a tick on his abdomen that he removed several weeks ago and believes that he has Lyme's disease.  He was triaged and waited in the waiting room for several hours, then developed chest pain, numbness to his right hand on several occasions that he believes was a TIA.   The history is provided by the patient.  Shortness of Breath  This is a new problem. The average episode lasts 2 weeks. The problem occurs continuously.The problem has been gradually worsening. Associated symptoms include a fever and cough. Pertinent negatives include no sputum production and no chest pain. He has tried nothing for the symptoms. Associated medical issues do not include asthma, COPD or CAD.    Past Medical History:  Diagnosis Date  . Anxiety   . Depression   . History of prediabetes   . Hyperlipidemia   . Hypertension     Patient Active Problem List   Diagnosis Date Noted  . Hypertensive urgency 05/31/2017  . Nausea and vomiting 05/31/2017  . Polycythemia 05/31/2017  . Nausea & vomiting 05/31/2017  . 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 Falls   . Anxiety and depression   . Chronic bilateral low back pain without sciatica   . ETOH abuse   . Tobacco abuse   . HLD (hyperlipidemia) 09/04/2016  . Acquired  obstructive hydrocephalus 09/04/2016  . Abnormal gait 09/03/2016  . Acute encephalopathy 09/03/2016  . Low back pain 06/09/2016  . HTN (hypertension) 04/01/2015    Past Surgical History:  Procedure Laterality Date  . CYSTOSCOPY WITH RETROGRADE PYELOGRAM, URETEROSCOPY AND STENT PLACEMENT Right 01/15/2015   Procedure: CYSTOSCOPY, RIGHT URETEROSCOPY/ RETROGRADE PYELOGRAM,HOLMIUM LASER/ LITHOTRIPSY, STENT PLACEMENT, URETERAL BALLOON DIALATION;  Surgeon: Kathie Rhodes, MD;  Location: [facility];  Service: Urology;  Laterality: Right;  . SKIN GRAFT     left hand  . VENTRICULOPERITONEAL SHUNT Right 09/04/2016   Procedure: SHUNT INSERTION VENTRICULAR-PERITONEAL;  Surgeon: Newman Pies, MD;  Location: [facility];  Service: Neurosurgery;  Laterality: Right;  . WISDOM TOOTH EXTRACTION         Home Medications    Prior to Admission medications   Medication Sig Start Date End Date Taking? Authorizing Provider  amLODipine (NORVASC) 10 MG tablet Take 1 tablet (10 mg total) by mouth daily. 05/09/17   Arnoldo Morale, MD  aspirin EC 81 MG tablet Take 81 mg by mouth daily.    [provider]  atorvastatin (LIPITOR) 20 MG tablet Take 1 tablet (20 mg total) by mouth daily. 05/09/17   Arnoldo Morale, MD  FLUoxetine (PROZAC) 20 MG tablet Take 1 tablet (20 mg total) by mouth daily. 05/09/17   Arnoldo Morale, MD  valsartan (DIOVAN) 320 MG tablet Take 1 tablet (320 mg total) by mouth daily. 05/09/17   Arnoldo Morale, MD  Family History Family History  Problem Relation Age of Onset  . Hyperlipidemia Father     Social History Social History   Tobacco Use  . Smoking status: Current Every Day Smoker    Packs/day: 1.00    Years: 28.00    Pack years: 28.00    Types: Cigarettes  . Smokeless tobacco: Never Used  Substance Use Topics  . Alcohol use: Yes    Alcohol/week: 3.0 - 3.6 oz    Types: 5 - 6 Cans of beer per week    Comment: daily alcohol/drinks beer, wine, and liquor  . Drug use: No      Allergies   Codeine and Lisinopril   Review of Systems Review of Systems  Constitutional: Positive for fever.  Respiratory: Positive for cough and shortness of breath. Negative for sputum production.   Cardiovascular: Negative for chest pain.  All other systems reviewed and are negative.    Physical Exam Updated Vital Signs BP (!) 146/97 (BP Location: Right Arm)   Pulse 92   Temp 98.3 F (36.8 C)   Resp 16   Ht 5\' 11"  (1.803 m)   Wt 77.1 kg (170 lb)   SpO2 100%   BMI 23.71 kg/m   Physical Exam  Constitutional: He is oriented to person, place, and time. He appears well-developed and well-nourished. No distress.  HENT:  Head: Normocephalic and atraumatic.  Mouth/Throat: Oropharynx is clear and moist.  Neck: Normal range of motion. Neck supple.  Cardiovascular: Normal rate and regular rhythm. Exam reveals no friction rub.  No murmur heard. Pulmonary/Chest: Effort normal and breath sounds normal. No respiratory distress. He has no wheezes. He has no rales. He exhibits no tenderness and no crepitus.  Abdominal: Soft. Bowel sounds are normal. He exhibits no distension. There is no tenderness.  Musculoskeletal: Normal range of motion. He exhibits no edema.       Right lower leg: Normal. He exhibits no tenderness and no edema.       Left lower leg: Normal. He exhibits no tenderness and no edema.  Neurological: He is alert and oriented to person, place, and time. No cranial nerve deficit. Coordination normal.  Skin: Skin is warm and dry. He is not diaphoretic.  Nursing note and vitals reviewed.    ED Treatments / Results  Labs (all labs ordered are listed, but only abnormal results are displayed) Labs Reviewed  COMPREHENSIVE METABOLIC PANEL - Abnormal; Notable for the following components:      Result Value   Sodium 133 (*)    Potassium 3.4 (*)    Chloride 99 (*)    CO2 19 (*)    Glucose, Bld 131 (*)    All other components within normal limits  CBC - Abnormal;  Notable for the following components:   WBC 15.4 (*)    RBC 6.33 (*)    Hemoglobin 19.7 (*)    HCT 56.1 (*)    All other components within normal limits  URINALYSIS, ROUTINE W REFLEX MICROSCOPIC - Abnormal; Notable for the following components:   Color, Urine AMBER (*)    APPearance HAZY (*)    Ketones, ur 5 (*)    Protein, ur 100 (*)    Leukocytes, UA TRACE (*)    Bacteria, UA RARE (*)    All other components within normal limits  LIPASE, BLOOD  I-STAT TROPONIN, ED    EKG  EKG Interpretation  Date/Time:  Friday September 28 2017 18:45:26 EST Ventricular Rate:  96 PR Interval:  156 QRS Duration: 102 QT Interval:  366 QTC Calculation: 462 R Axis:   85 Text Interpretation:  Normal sinus rhythm Right atrial enlargement Borderline ECG Confirmed by Veryl Speak [phone]) on 09/28/2017 11:34:37 PM       Radiology No results found.  Procedures Procedures (including critical care time)  Medications Ordered in ED Medications  ondansetron (ZOFRAN) injection 4 mg (not administered)  sodium chloride 0.9 % bolus 1,000 mL (not administered)  ondansetron (ZOFRAN-ODT) 4 MG disintegrating tablet (4 mg  Given 09/28/17 1923)     Initial Impression / Assessment and Plan / ED Course  I have reviewed the triage vital signs and the nursing notes.  Pertinent labs & imaging results that were available during my care of the patient were reviewed by me and considered in my medical decision making (see chart for details).  Patient is a 61 year old male with past medical history of polycythemia vera.  He presents today with complaints of difficulty breathing.    His workup today reveals no obvious abnormality.  He is concerned that he has a blood clot related to his elevated red cell count.  CT Angio of the chest reveals no evidence for this.  He also underwent a head CT as he was describing some numbness to his left hand and was concerned about a TIA.  This was unremarkable as well.  I had  initially planned to obtain an MRI, however they said that the study could not be performed due to the shunt he has in place.  After the patient received IV fluids and was observed for a period of time, his symptoms have resolved and he feels back to normal.  I see no indication for admission or other specific treatment at this time.  He does report a recent tick bite and is concerned about infection from this.  I have agreed to prescribe doxycycline for this.   Final Clinical Impressions(s) / ED Diagnoses   Final diagnoses:  None    ED Discharge Orders    None       Veryl Speak, MD 09/29/17 [id number]    Veryl Speak, MD 09/29/17 [phone]

## 2017-09-29 NOTE — ED Notes (Signed)
ED Provider at bedside. 

## 2017-09-29 NOTE — Discharge Instructions (Addendum)
Doxycycline as prescribed.  Drink plenty of fluids.  Follow-up with your primary doctor in the next 3-4 days, and return to the ER if symptoms significantly worsen or change.

## 2017-09-29 NOTE — ED Notes (Signed)
Patient transported to CT 

## 2017-10-01 ENCOUNTER — Telehealth: Payer: Self-pay | Admitting: Family Medicine

## 2017-10-01 LAB — B. BURGDORFI ANTIBODIES

## 2017-10-01 NOTE — Telephone Encounter (Signed)
Pt called to requesting speak with the financial dept. Please call him back

## 2017-10-03 ENCOUNTER — Ambulatory Visit: Payer: Self-pay | Attending: Family Medicine | Admitting: Family Medicine

## 2017-10-03 ENCOUNTER — Telehealth: Payer: Self-pay | Admitting: Family Medicine

## 2017-10-03 ENCOUNTER — Encounter: Payer: Self-pay | Admitting: Family Medicine

## 2017-10-03 VITALS — BP 145/73 | HR 79 | Temp 98.6°F | Ht 71.0 in | Wt 182.6 lb

## 2017-10-03 DIAGNOSIS — M545 Low back pain: Secondary | ICD-10-CM | POA: Insufficient documentation

## 2017-10-03 DIAGNOSIS — R0600 Dyspnea, unspecified: Secondary | ICD-10-CM

## 2017-10-03 DIAGNOSIS — G8929 Other chronic pain: Secondary | ICD-10-CM | POA: Insufficient documentation

## 2017-10-03 DIAGNOSIS — Z982 Presence of cerebrospinal fluid drainage device: Secondary | ICD-10-CM | POA: Insufficient documentation

## 2017-10-03 DIAGNOSIS — K4090 Unilateral inguinal hernia, without obstruction or gangrene, not specified as recurrent: Secondary | ICD-10-CM | POA: Insufficient documentation

## 2017-10-03 DIAGNOSIS — Z7982 Long term (current) use of aspirin: Secondary | ICD-10-CM | POA: Insufficient documentation

## 2017-10-03 DIAGNOSIS — Z79899 Other long term (current) drug therapy: Secondary | ICD-10-CM | POA: Insufficient documentation

## 2017-10-03 DIAGNOSIS — I10 Essential (primary) hypertension: Secondary | ICD-10-CM | POA: Insufficient documentation

## 2017-10-03 DIAGNOSIS — F329 Major depressive disorder, single episode, unspecified: Secondary | ICD-10-CM | POA: Insufficient documentation

## 2017-10-03 DIAGNOSIS — E7800 Pure hypercholesterolemia, unspecified: Secondary | ICD-10-CM | POA: Insufficient documentation

## 2017-10-03 DIAGNOSIS — F419 Anxiety disorder, unspecified: Secondary | ICD-10-CM | POA: Insufficient documentation

## 2017-10-03 DIAGNOSIS — R7303 Prediabetes: Secondary | ICD-10-CM | POA: Insufficient documentation

## 2017-10-03 DIAGNOSIS — R0602 Shortness of breath: Secondary | ICD-10-CM | POA: Insufficient documentation

## 2017-10-03 DIAGNOSIS — D751 Secondary polycythemia: Secondary | ICD-10-CM | POA: Insufficient documentation

## 2017-10-03 DIAGNOSIS — E785 Hyperlipidemia, unspecified: Secondary | ICD-10-CM | POA: Insufficient documentation

## 2017-10-03 DIAGNOSIS — Z720 Tobacco use: Secondary | ICD-10-CM

## 2017-10-03 MED ORDER — ESCITALOPRAM OXALATE 10 MG PO TABS: 10.0000 mg | ORAL_TABLET | Freq: Every day | ORAL | 3 refills | Status: DC

## 2017-10-03 MED ORDER — AMLODIPINE BESYLATE 10 MG PO TABS: 10.0000 mg | ORAL_TABLET | Freq: Every day | ORAL | 3 refills | Status: DC

## 2017-10-03 MED ORDER — ATORVASTATIN CALCIUM 20 MG PO TABS: 20.0000 mg | ORAL_TABLET | Freq: Every day | ORAL | 3 refills | Status: DC

## 2017-10-03 MED ORDER — ALBUTEROL SULFATE HFA 108 (90 BASE) MCG/ACT IN AERS: 2.0000 | INHALATION_SPRAY | Freq: Four times a day (QID) | RESPIRATORY_TRACT | 2 refills | Status: DC | PRN

## 2017-10-03 MED ORDER — VALSARTAN 320 MG PO TABS: 320.0000 mg | ORAL_TABLET | Freq: Every day | ORAL | 3 refills | Status: DC

## 2017-10-03 NOTE — Progress Notes (Signed)
Subjective:  Patient ID: Luis Henderson, male    DOB: 12-26-1955  Age: 61 y.o. MRN: [id number]  CC: Shortness of Breath   HPI Luis Henderson is a 61 year old male with a history of hypertension, tobacco abuse, anxiety, chronic low back pain hyperlipidemia who presents today complaining of shortness of breath.  He was seen at the ED 5 days ago with the above symptoms and CT angio of the chest was negative for PE, CT of the head was unremarkable.  He was treated with IV fluids and received a prescription for doxycycline given his complaint about a tick bite.  Today he informs me his shortness of breath is a bit better (tells me he walks 2.2 miles and felt fine) but he is concerned that this is from his polycythemia and would like to be referred to hematology. He also informs me that it could be related to his anxiety as his mom passed 6 weeks ago and he has been more anxious and also has no money and no job and this have been stressors for him. He is currently on Prozac which is an effective.  He complains of bilateral inguinal hernias which developed in the last few days when he was blowing his nose and he would like that checked out.  Past Medical History:  Diagnosis Date  . Anxiety   . Depression   . History of prediabetes   . Hyperlipidemia   . Hypertension     Past Surgical History:  Procedure Laterality Date  . CYSTOSCOPY WITH RETROGRADE PYELOGRAM, URETEROSCOPY AND STENT PLACEMENT Right 01/15/2015   Procedure: CYSTOSCOPY, RIGHT URETEROSCOPY/ RETROGRADE PYELOGRAM,HOLMIUM LASER/ LITHOTRIPSY, STENT PLACEMENT, URETERAL BALLOON DIALATION;  Surgeon: Kathie Rhodes, MD;  Location: [facility];  Service: Urology;  Laterality: Right;  . SKIN GRAFT     left hand  . VENTRICULOPERITONEAL SHUNT Right 09/04/2016   Procedure: SHUNT INSERTION VENTRICULAR-PERITONEAL;  Surgeon: Newman Pies, MD;  Location: [facility];  Service: Neurosurgery;  Laterality: Right;  . WISDOM TOOTH EXTRACTION       Allergies  Allergen Reactions  . Codeine Itching  . Lisinopril Cough     Outpatient Medications Prior to Visit  Medication Sig Dispense Refill  . aspirin EC 81 MG tablet Take 81 mg by mouth daily.    Marland Kitchen amLODipine (NORVASC) 10 MG tablet Take 1 tablet (10 mg total) by mouth daily. 30 tablet 5  . doxycycline (VIBRAMYCIN) 100 MG capsule Take 1 capsule (100 mg total) by mouth 2 (two) times daily. (Patient not taking: Reported on 10/03/2017) 20 capsule 0  . atorvastatin (LIPITOR) 20 MG tablet Take 1 tablet (20 mg total) by mouth daily. (Patient not taking: Reported on 10/03/2017) 30 tablet 3  . FLUoxetine (PROZAC) 20 MG tablet Take 1 tablet (20 mg total) by mouth daily. (Patient not taking: Reported on 10/03/2017) 30 tablet 3  . valsartan (DIOVAN) 320 MG tablet Take 1 tablet (320 mg total) by mouth daily. (Patient not taking: Reported on 10/03/2017) 30 tablet 3   No facility-administered medications prior to visit.     ROS Review of Systems  Constitutional: Negative for activity change and appetite change.  HENT: Negative for sinus pressure and sore throat.   Eyes: Negative for visual disturbance.  Respiratory: Positive for shortness of breath. Negative for cough and chest tightness.   Cardiovascular: Negative for chest pain and leg swelling.  Gastrointestinal: Negative for abdominal distention, abdominal pain, constipation and diarrhea.  Endocrine: Negative.   Genitourinary: Negative for dysuria.  Musculoskeletal:  Negative for joint swelling and myalgias.  Skin: Negative for rash.  Allergic/Immunologic: Negative.   Neurological: Negative for weakness, light-headedness and numbness.  Psychiatric/Behavioral: Positive for dysphoric mood. Negative for suicidal ideas.    Objective:  BP (!) 145/73   Pulse 79   Temp 98.6 F (37 C) (Oral)   Ht 5\' 11"  (1.803 m)   Wt 182 lb 9.6 oz (82.8 kg)   SpO2 96%   BMI 25.47 kg/m   BP/Weight 10/03/2017 09/29/2017 18/29/9371  Systolic BP  696 789 -  Diastolic BP 73 76 -  Wt. (Lbs) 182.6 - 170  BMI 25.47 - 23.71      Physical Exam  Constitutional: He is oriented to person, place, and time. He appears well-developed and well-nourished.  Cardiovascular: Normal rate, normal heart sounds and intact distal pulses.  No murmur heard. Pulmonary/Chest: Effort normal and breath sounds normal. He has no wheezes. He has no rales. He exhibits no tenderness.  Abdominal: Soft. Bowel sounds are normal. He exhibits no distension and no mass. There is no tenderness.  Genitourinary:  Genitourinary Comments: Left inguinal hernia which is reducible, not tender  Musculoskeletal: Normal range of motion.  Neurological: He is alert and oriented to person, place, and time.  Psychiatric:  Irritable    CMP Latest Ref Rng & Units 09/28/2017 06/02/2017 06/01/2017  Glucose 65 - 99 mg/dL 131(H) 138(H) 101(H)  BUN 6 - 20 mg/dL 20 14 9   Creatinine 0.61 - 1.24 mg/dL 1.17 1.04 0.91  Sodium 135 - 145 mmol/L 133(L) 138 139  Potassium 3.5 - 5.1 mmol/L 3.4(L) 4.1 3.6  Chloride 101 - 111 mmol/L 99(L) 106 107  CO2 22 - 32 mmol/L 19(L) 23 24  Calcium 8.9 - 10.3 mg/dL 9.8 9.3 8.9  Total Protein 6.5 - 8.1 g/dL 6.9 6.6 5.7(L)  Total Bilirubin 0.3 - 1.2 mg/dL 1.0 1.1 1.0  Alkaline Phos 38 - 126 U/L 77 73 73  AST 15 - 41 U/L 25 20 16   ALT 17 - 63 U/L 20 17 15(L)    Lipid Panel     Component Value Date/Time   CHOL 178 04/14/2016 1002   TRIG 140 04/14/2016 1002   HDL 47 04/14/2016 1002   CHOLHDL 3.8 04/14/2016 1002   VLDL 28 04/14/2016 1002   LDLCALC 103 04/14/2016 1002    Assessment & Plan:   1. Essential hypertension Controlled due to running out of medications which I have refilled Low-sodium, DASH diet - amLODipine (NORVASC) 10 MG tablet; Take 1 tablet (10 mg total) by mouth daily.  Dispense: 30 tablet; Refill: 3 - valsartan (DIOVAN) 320 MG tablet; Take 1 tablet (320 mg total) by mouth daily.  Dispense: 30 tablet; Refill: 3  2. Pure  hypercholesterolemia Controlled Low-cholesterol diet - atorvastatin (LIPITOR) 20 MG tablet; Take 1 tablet (20 mg total) by mouth daily.  Dispense: 30 tablet; Refill: 3  3. Anxiety and depression Uncontrolled on Prozac-exacerbated by recent passing of his mom, lack of finances and social situation Switch to Lexapro - escitalopram (LEXAPRO) 10 MG tablet; Take 1 tablet (10 mg total) by mouth daily.  Dispense: 30 tablet; Refill: 3  4. Polycythemia I have explained to him his polycythemia could be as a result of his smoking but he is insisting on a hematology referral if he states this is causing shortness of breath - Ambulatory referral to Hematology  5. Tobacco abuse Spent 3 minutes counseling on cessation and he is not ready to quit at this time  6. Unilateral  inguinal hernia without obstruction or gangrene, recurrence not specified No evidence of obstruction or strangulation He would like to hold off on surgery referral  7. Dyspnea, unspecified type Would need to exclude for underlying COPD by means of a pulmonary function test given his history of smoking however he declines. He attributes his dyspnea to his polycythemia versus anxiety I will place him on albuterol MDI as needed - albuterol (PROVENTIL HFA;VENTOLIN HFA) 108 (90 Base) MCG/ACT inhaler; Inhale 2 puffs into the lungs every 6 (six) hours as needed for wheezing or shortness of breath.  Dispense: 1 Inhaler; Refill: 2   Meds ordered this encounter  Medications  . amLODipine (NORVASC) 10 MG tablet    Sig: Take 1 tablet (10 mg total) by mouth daily.    Dispense:  30 tablet    Refill:  3  . valsartan (DIOVAN) 320 MG tablet    Sig: Take 1 tablet (320 mg total) by mouth daily.    Dispense:  30 tablet    Refill:  3    Discontinue previous dose  . atorvastatin (LIPITOR) 20 MG tablet    Sig: Take 1 tablet (20 mg total) by mouth daily.    Dispense:  30 tablet    Refill:  3  . escitalopram (LEXAPRO) 10 MG tablet    Sig: Take  1 tablet (10 mg total) by mouth daily.    Dispense:  30 tablet    Refill:  3    Discontinue Prozac  . albuterol (PROVENTIL HFA;VENTOLIN HFA) 108 (90 Base) MCG/ACT inhaler    Sig: Inhale 2 puffs into the lungs every 6 (six) hours as needed for wheezing or shortness of breath.    Dispense:  1 Inhaler    Refill:  2    Follow-up: Return in about 3 months (around 01/01/2018) for Follow-up on chronic medical conditions.   Arnoldo Morale MD

## 2017-10-03 NOTE — Patient Instructions (Signed)

## 2017-10-03 NOTE — Telephone Encounter (Signed)
Pt came in to his hospital follow up appointment, after finishing he waited for his financial appointment which he was informed was until the following week. He stormed out of the building without the application

## 2017-10-04 ENCOUNTER — Encounter: Payer: Self-pay | Admitting: Family Medicine

## 2017-10-10 ENCOUNTER — Ambulatory Visit: Payer: Self-pay | Attending: Family Medicine

## 2017-10-10 NOTE — Telephone Encounter (Signed)
I met with this patient today and he applied for financial assistance.

## 2017-10-13 ENCOUNTER — Telehealth: Payer: Self-pay | Admitting: Oncology

## 2017-10-13 NOTE — Telephone Encounter (Signed)
10/13/17 called patient and confirmed appointment for 11/16/17 with Dr. Alen Blew.  Patient is aware to arrive 30 minutes prior to register.

## 2017-11-16 ENCOUNTER — Inpatient Hospital Stay: Payer: Self-pay

## 2017-11-16 ENCOUNTER — Telehealth: Payer: Self-pay | Admitting: Family Medicine

## 2017-11-16 ENCOUNTER — Inpatient Hospital Stay: Payer: Self-pay | Attending: Oncology | Admitting: Oncology

## 2017-11-16 VITALS — BP 144/79 | HR 92 | Temp 98.4°F | Resp 18 | Ht 71.0 in | Wt 168.4 lb

## 2017-11-16 DIAGNOSIS — E785 Hyperlipidemia, unspecified: Secondary | ICD-10-CM | POA: Insufficient documentation

## 2017-11-16 DIAGNOSIS — F419 Anxiety disorder, unspecified: Secondary | ICD-10-CM | POA: Insufficient documentation

## 2017-11-16 DIAGNOSIS — I10 Essential (primary) hypertension: Secondary | ICD-10-CM | POA: Insufficient documentation

## 2017-11-16 DIAGNOSIS — R7303 Prediabetes: Secondary | ICD-10-CM | POA: Insufficient documentation

## 2017-11-16 DIAGNOSIS — E669 Obesity, unspecified: Secondary | ICD-10-CM | POA: Insufficient documentation

## 2017-11-16 DIAGNOSIS — Z982 Presence of cerebrospinal fluid drainage device: Secondary | ICD-10-CM | POA: Insufficient documentation

## 2017-11-16 DIAGNOSIS — F17210 Nicotine dependence, cigarettes, uncomplicated: Secondary | ICD-10-CM | POA: Insufficient documentation

## 2017-11-16 DIAGNOSIS — G919 Hydrocephalus, unspecified: Secondary | ICD-10-CM | POA: Insufficient documentation

## 2017-11-16 DIAGNOSIS — D751 Secondary polycythemia: Secondary | ICD-10-CM | POA: Insufficient documentation

## 2017-11-16 DIAGNOSIS — K4020 Bilateral inguinal hernia, without obstruction or gangrene, not specified as recurrent: Secondary | ICD-10-CM

## 2017-11-16 DIAGNOSIS — Z79899 Other long term (current) drug therapy: Secondary | ICD-10-CM | POA: Insufficient documentation

## 2017-11-16 DIAGNOSIS — Z7982 Long term (current) use of aspirin: Secondary | ICD-10-CM | POA: Insufficient documentation

## 2017-11-16 DIAGNOSIS — F329 Major depressive disorder, single episode, unspecified: Secondary | ICD-10-CM | POA: Insufficient documentation

## 2017-11-16 LAB — CBC WITH DIFFERENTIAL (CANCER CENTER ONLY)
BASOS ABS: 0.1 10*3/uL (ref 0.0–0.1)
BASOS PCT: 1 %
EOS ABS: 0.1 10*3/uL (ref 0.0–0.5)
Eosinophils Relative: 1 %
HCT: 53.6 % — ABNORMAL HIGH (ref 38.4–49.9)
HEMOGLOBIN: 18.5 g/dL — AB (ref 13.0–17.1)
Lymphocytes Relative: 24 %
Lymphs Abs: 2.7 10*3/uL (ref 0.9–3.3)
MCH: 31 pg (ref 27.2–33.4)
MCHC: 34.5 g/dL (ref 32.0–36.0)
MCV: 89.8 fL (ref 79.3–98.0)
MONO ABS: 1.1 10*3/uL — AB (ref 0.1–0.9)
MONOS PCT: 10 %
NEUTROS PCT: 64 %
Neutro Abs: 7.1 10*3/uL — ABNORMAL HIGH (ref 1.5–6.5)
Platelet Count: 309 10*3/uL (ref 140–400)
RBC: 5.97 MIL/uL — ABNORMAL HIGH (ref 4.20–5.82)
RDW: 14 % (ref 11.0–14.6)
WBC Count: 11.1 10*3/uL — ABNORMAL HIGH (ref 4.0–10.3)

## 2017-11-16 LAB — CMP (CANCER CENTER ONLY)
ALK PHOS: 86 U/L (ref 40–150)
ALT: 14 U/L (ref 0–55)
AST: 16 U/L (ref 5–34)
Albumin: 4.2 g/dL (ref 3.5–5.0)
Anion gap: 11 (ref 3–11)
BILIRUBIN TOTAL: 0.5 mg/dL (ref 0.2–1.2)
BUN: 13 mg/dL (ref 7–26)
CALCIUM: 9.9 mg/dL (ref 8.4–10.4)
CO2: 21 mmol/L — AB (ref 22–29)
CREATININE: 0.85 mg/dL (ref 0.70–1.30)
Chloride: 109 mmol/L (ref 98–109)
GFR, Estimated: >60 mL/min (ref >60)
Glucose, Bld: 105 mg/dL (ref 70–140)
Potassium: 3.9 mmol/L (ref 3.5–5.1)
SODIUM: 141 mmol/L (ref 136–145)
TOTAL PROTEIN: 7.2 g/dL (ref 6.4–8.3)

## 2017-11-16 MED ORDER — PROCHLORPERAZINE MALEATE 10 MG PO TABS: 10.0000 mg | ORAL_TABLET | Freq: Four times a day (QID) | ORAL | 1 refills | Status: DC | PRN

## 2017-11-16 NOTE — Telephone Encounter (Signed)
Pt called to request a referral to a surgeon due to his inguinal hernia  Please follow up

## 2017-11-16 NOTE — Telephone Encounter (Signed)
Will route to PCP 

## 2017-11-16 NOTE — Progress Notes (Signed)
Reason for Referral: Polycythemia.  HPI: 62 year old gentleman native of [place] where he lived the majority of his life.  He has a history of anxiety, depression and hypertension.  He also had a VP shunt placed in 2017 for hydrocephalus.  He was noted to have elevated hemoglobin and July 2018.  At that time hemoglobin was 18.8 with normal white cell count with a hematocrit of 54.  Blood count were normal.  His CBC on September 28, 2017 showed a hemoglobin of 19.7, white cell count of 15.4 and a platelet count of 362.  His hemoglobin has fluctuated between normal range at high levels since 2013.  In 2013 his hemoglobin was 17.2.  In 2016 his hemoglobin was 18.4 and in November 2017 his hemoglobin normalized temporarily while he is having his neurosurgery.  He reports no major complaints related to the symptoms but does report periodic nausea.  He denies any headaches or blurry vision.  He denies any thrombosis or bleeding episodes.  He denied any chest pain or angina.  He continues to smoke and denies any exposure to to toxic fumes or carbon monoxide.  He was never diagnosed with sleep apnea and had about 90 pound weight loss.  He does not report any headaches, blurry vision, syncope or seizures. Does not report any fevers, chills or sweats.  Does not report any cough, wheezing or hemoptysis.  Does not report any chest pain, palpitation, orthopnea or leg edema.  Does not report any abdominal pain. He denies any hematochezia or melena.  He does not report any constipation or diarrhea.  Does not report any skeletal complaints.    Does not report frequency, urgency or hematuria.  Does not report any skin rashes or lesions. Does not report any heat or cold intolerance.  Does not report any lymphadenopathy or petechiae. Remaining review of systems is negative.      Past Medical History:  Diagnosis Date  . Anxiety   . Depression   . History of prediabetes   . Hyperlipidemia   . Hypertension    :  Past Surgical History:  Procedure Laterality Date  . CYSTOSCOPY WITH RETROGRADE PYELOGRAM, URETEROSCOPY AND STENT PLACEMENT Right 01/15/2015   Procedure: CYSTOSCOPY, RIGHT URETEROSCOPY/ RETROGRADE PYELOGRAM,HOLMIUM LASER/ LITHOTRIPSY, STENT PLACEMENT, URETERAL BALLOON DIALATION;  Surgeon: Kathie Rhodes, MD;  Location: [facility];  Service: Urology;  Laterality: Right;  . SKIN GRAFT     left hand  . VENTRICULOPERITONEAL SHUNT Right 09/04/2016   Procedure: SHUNT INSERTION VENTRICULAR-PERITONEAL;  Surgeon: Newman Pies, MD;  Location: [facility];  Service: Neurosurgery;  Laterality: Right;  . WISDOM TOOTH EXTRACTION    :   Current Outpatient Medications:  .  albuterol (PROVENTIL HFA;VENTOLIN HFA) 108 (90 Base) MCG/ACT inhaler, Inhale 2 puffs into the lungs every 6 (six) hours as needed for wheezing or shortness of breath., Disp: 1 Inhaler, Rfl: 2 .  amLODipine (NORVASC) 10 MG tablet, Take 1 tablet (10 mg total) by mouth daily., Disp: 30 tablet, Rfl: 3 .  aspirin EC 81 MG tablet, Take 81 mg by mouth daily., Disp: , Rfl:  .  escitalopram (LEXAPRO) 10 MG tablet, Take 1 tablet (10 mg total) by mouth daily., Disp: 30 tablet, Rfl: 3 .  prochlorperazine (COMPAZINE) 10 MG tablet, Take 1 tablet (10 mg total) by mouth every 6 (six) hours as needed for nausea or vomiting., Disp: 30 tablet, Rfl: 1:  Allergies  Allergen Reactions  . Codeine Itching  . Lisinopril Cough  :  Family History  Problem  Relation Age of Onset  . Hyperlipidemia Father   :  Social History   Socioeconomic History  . Marital status: Divorced    Spouse name: Not on file  . Number of children: 2  . Years of education: Not on file  . Highest education level: Not on file  Social Needs  . Financial resource strain: Not on file  . Food insecurity - worry: Not on file  . Food insecurity - inability: Not on file  . Transportation needs - medical: Not on file  . Transportation needs - non-medical: Not on file  Occupational  History    Employer: [employer]    Comment: Radio broadcast assistant  Tobacco Use  . Smoking status: Current Every Day Smoker    Packs/day: 1.00    Years: 28.00    Pack years: 28.00    Types: Cigarettes  . Smokeless tobacco: Never Used  Substance and Sexual Activity  . Alcohol use: Yes    Alcohol/week: 3.0 - 3.6 oz    Types: 5 - 6 Cans of beer per week    Comment: daily alcohol/drinks beer, wine, and liquor  . Drug use: No  . Sexual activity: Not on file  Other Topics Concern  . Not on file  Social History Narrative  . Not on file  :  Pertinent items are noted in HPI.  Exam: Blood pressure (!) 144/79, pulse 92, temperature 98.4 F (36.9 C), temperature source Oral, resp. rate 18, height 5\' 11"  (1.803 m), weight 168 lb 6.4 oz (76.4 kg), SpO2 99 %.  ECOG 0 General appearance: alert and cooperative.  Appeared agitated at times. Eyes: Injected sclera.  No icterus noted.  Pupils are equal and round and reactive. Throat: No oral thrush or ulcers.  Mucous membranes are moist and pink. Resp: clear to auscultation bilaterally no wheezing all dullness to percussion. Cardio: regular rate and rhythm, S1, S2 normal, no murmur, click, rub or gallop GI: soft, non-tender; bowel sounds normal; no masses,  no organomegaly Musculoskeletal: No joint deformity or effusion. Skin: Skin color, texture, turgor normal. No rashes or lesions Lymph nodes: Cervical, supraclavicular, and axillary nodes normal. Neurologic: Grossly normal  CBC    Component Value Date/Time   WBC 15.4 (H) 09/28/2017 1914   RBC 6.33 (H) 09/28/2017 1914   HGB 19.7 (H) 09/28/2017 1914   HGB 18.8 (H) 05/09/2017 1554   HCT 56.1 (H) 09/28/2017 1914   HCT 54.9 (H) 05/09/2017 1554   PLT 362 09/28/2017 1914   PLT 281 05/09/2017 1554   MCV 88.6 09/28/2017 1914   MCV 87 05/09/2017 1554   MCH 31.1 09/28/2017 1914   MCHC 35.1 09/28/2017 1914   RDW 14.0 09/28/2017 1914   RDW 15.3 05/09/2017 1554   LYMPHSABS 2.7 08/09/2017 1528    LYMPHSABS 2.5 05/09/2017 1554   MONOABS 1.2 (H) 08/09/2017 1528   EOSABS 0.3 08/09/2017 1528   EOSABS 0.3 05/09/2017 1554   BASOSABS 0.1 08/09/2017 1528   BASOSABS 0.1 05/09/2017 1554     Assessment and Plan:   62 year old gentleman with the following issues:  1.  Polycythemia: His hemoglobin has fluctuated since 2013 ranging between 17.2 and currently at 19.7.  His white cell count has been normal but at times slightly elevated with a normal differential.  The differential diagnosis was discussed today with the patient in detail.  Secondary polycythemia would be a consideration given his smoking history and his obesity.  He has lost some weight ove time that is intentional.  He  denied any exposure to fumes and certainly he could have sleep apnea.  Polycythemia vera is also a consideration given the persistent elevation in his hemoglobin and mild elevation his white cell count.  The natural course of polycythemia vera was reviewed specifically.  Treatment options were also discussed including therapeutic phlebotomy and cytoreductive therapy utilizing hydroxyurea.  From a management standpoint, I have recommended the repeating his hemoglobin today and obtaining a JAK2 mutation to rule out polycythemia vera.  Depending on these findings, will determine whether he will need phlebotomy moving forward.  He is asymptomatic at this time and will continue to monitor his hemoglobin every 3 months.  2.  Thrombosis prophylaxis: He is currently on aspirin and I recommended continuing that indefinitely.  I will offer him cardiovascular protection against strokes and myocardial infarctions.  3.  Nausea: Unrelated to his polycythemia.  In a recommended to use Compazine as needed.  4.  Poor nutrition: He has lost 90 pounds intentionally although he is not sure about obtaining adequate nutrition.  Refer to dietitian will be done today.  5.  Follow-up: We will be in the next 3 months to follow his  progress.  45  minutes was spent with the patient face-to-face today.  More than 50% of time was dedicated to patient counseling, education and coordination of his multifaceted care.

## 2017-11-17 LAB — ERYTHROPOIETIN: ERYTHROPOIETIN: 3.5 m[IU]/mL (ref 2.6–18.5)

## 2017-11-19 ENCOUNTER — Telehealth: Payer: Self-pay | Admitting: Oncology

## 2017-11-19 ENCOUNTER — Telehealth: Payer: Self-pay

## 2017-11-19 NOTE — Telephone Encounter (Signed)
Referral has been placed. 

## 2017-11-19 NOTE — Telephone Encounter (Signed)
Called regarding 2/7

## 2017-11-19 NOTE — Addendum Note (Signed)
Addended by: Charlott Rakes on: 11/19/2017 08:53 AM   Modules accepted: Orders

## 2017-11-19 NOTE — Telephone Encounter (Signed)
Patient was called and informed of referral being placed. 

## 2017-11-20 ENCOUNTER — Telehealth: Payer: Self-pay | Admitting: General Practice

## 2017-11-20 ENCOUNTER — Encounter: Payer: Self-pay | Admitting: Nutrition

## 2017-11-20 NOTE — Telephone Encounter (Signed)
[facility] CSW Progress Note  CSW contacted patient, explained services available in [place] and offered option to schedule appt w CSW.  Patient wants to talk either before or after upcoming visit w nutritionist on 2/8.  CSW will connect w patient at that time and assess for needs.  Edwyna Shell, LCSW Clinical Social Worker Phone:  [phone]

## 2017-11-22 ENCOUNTER — Inpatient Hospital Stay: Payer: Self-pay | Admitting: Nutrition

## 2017-11-22 NOTE — Progress Notes (Signed)
Nutrition Assessment  Reason for Assessment: Referral for weight maintance  ASSESSMENT:  62 year old male with diagnosis of polycythemia, unsure of differential diagnosis. He is a pt of Dr. Alen Blew. According to chart, pt may need phlebotomy moving forward and continued monitor of his hemoglobin.  Pt presents with intentional weight loss of "90 lbs" over the past 2 years.  Pt reports he has great energy levels and is working to maintain this weight. However, pt is "petrified" of certain foods. Per report has been eating less solid foods. Pt consumes nuts and meat just smaller portions than normal. Pt consumes 2-3 chocolate Boost and 2-3 [employer] daily. Pt reports he has gotten a referral regarding inguinal hernia. Pt reports he plans to eat (specifically [facility]) after surgery and that his fear of eating will subside.   Pt reports no nutrition impact symptoms at this time however states for a time he was experiencing intermittent nausea every 2-3 weeks.  Pt is going through a lot of life changes including his mother passing away and planning a move. He states he has been bouncing off the walls and very productive. RD explained the importance of adequate nutrition to maintain weight when expending additional energy.   Nutrition Focused Physical Exam: deferred   Medications: Compazine  Labs: elevated WBC, RBC, hemoglobin and HCT  Anthropometrics:   Height: 5'11'' Weight: 168 lbs (down intentionally) BMI: 23.5 kg/m^2  Estimated Energy Needs  Kcals: 2100-2300 Protein: 95-105 grams Fluid: 2.3 L/d  NUTRITION DIAGNOSIS: Predicted suboptimal intake related to fear of eating (hernia) as evidenced by RD referral, diet recall, and pt report   INTERVENTION:  Provided samples and coupons  Encouraged adequate intake to promote weight maintainence Pt agreeable to attempting to eat more and states he pays attention to his hunger/satiety cues. Questions answered. Handouts  given. Contact information provided.   MONITORING, EVALUATION, GOAL: Pt will consume adequate calories and protein to achieve weight maintainence  NEXT VISIT: To be scheduled  [place] Ranger, MS, RDN, LDN 11/22/2017 5:44 PM

## 2017-11-23 ENCOUNTER — Telehealth: Payer: Self-pay | Admitting: *Deleted

## 2017-11-23 NOTE — Telephone Encounter (Signed)
Yes

## 2017-11-23 NOTE — Telephone Encounter (Signed)
Patient called and left a voice mail message stating,"can I donate blood? Return number is [phone]."

## 2017-11-23 NOTE — Telephone Encounter (Signed)
Per Dr. Alen Blew, I informed patient that it's OK to donate blood. He verbalized understanding.

## 2017-11-28 LAB — JAK2 (INCLUDING V617F AND EXON 12), MPL,& CALR-NEXT GEN SEQ

## 2018-01-03 ENCOUNTER — Ambulatory Visit: Payer: Self-pay | Admitting: Family Medicine

## 2018-02-04 ENCOUNTER — Other Ambulatory Visit: Payer: Self-pay

## 2018-02-08 ENCOUNTER — Encounter: Payer: Self-pay | Admitting: Oncology

## 2018-02-13 ENCOUNTER — Ambulatory Visit: Payer: Self-pay | Admitting: Oncology

## 2018-02-13 NOTE — Progress Notes (Deleted)
Review of Systems - {ros [id number]}

## 2019-02-04 ENCOUNTER — Encounter: Payer: Self-pay | Admitting: *Deleted

## 2019-02-04 NOTE — Congregational Nurse Program (Signed)
COVID 19 HOTEL SCREEN Patient needed no additional assistance.

## 2019-02-11 NOTE — Progress Notes (Signed)
[place] Screening performed. Temperature, PHQ-9, and need for medical care and medications assessed. Patient has visit with [facility] NP Audrea Muscat Pacey on 02/17/19. She is aware of his blood pressure.  Arnold Long RN MSN

## 2019-02-17 NOTE — Progress Notes (Signed)
Closing encounter per request. 

## 2019-02-18 NOTE — Progress Notes (Signed)
COVID Hotel Screening performed. Temperature, PHQ-9, and need for medical care and medications assessed. No additional needs assessed at this time.  Camani Sesay RN MSN 

## 2019-02-20 NOTE — Progress Notes (Signed)
COVID Hotel Screening performed. Temperature, PHQ-9, and need for medical care and medications assessed. No additional needs assessed at this time.  Teighan Aubert RN MSN 

## 2019-02-27 NOTE — Progress Notes (Signed)
COVID Hotel Screening performed. Temperature, PHQ-9, and need for medical care and medications assessed. No additional needs assessed at this time.  Jordynne Mccown RN MSN 

## 2019-03-05 NOTE — Progress Notes (Signed)
COVID Hotel Screening performed. Temperature, PHQ-9, and need for medical care and medications assessed. No additional needs assessed at this time.  Adalyna Godbee RN MSN 

## 2019-03-12 NOTE — Progress Notes (Signed)
COVID Hotel Screening performed. Temperature, PHQ-9, and need for medical care and medications assessed. No additional needs assessed at this time.  Katreena Schupp RN MSN 

## 2019-03-20 ENCOUNTER — Other Ambulatory Visit (HOSPITAL_COMMUNITY): Payer: Self-pay

## 2019-03-20 DIAGNOSIS — Z20822 Contact with and (suspected) exposure to covid-19: Secondary | ICD-10-CM

## 2019-03-21 ENCOUNTER — Other Ambulatory Visit: Payer: Self-pay

## 2019-03-21 DIAGNOSIS — Z20822 Contact with and (suspected) exposure to covid-19: Secondary | ICD-10-CM

## 2019-03-23 LAB — NOVEL CORONAVIRUS, NAA: SARS-CoV-2, NAA: NOT DETECTED

## 2019-03-26 NOTE — Progress Notes (Signed)
COVID Hotel Screening performed. COVID screening, temperature, PHQ-9, and need for medical care and medications assessed. No additional needs assessed at this time.  Bence Trapp RN MSN 

## 2019-04-02 NOTE — Progress Notes (Signed)
[place] Screening performed. COVID screening, temperature, PHQ-9, and need for medical care and medications assessed. Needs a refill in the next 5 days for BP medications. Also requesting an evaluation for gallbladder issues. Referral sent to [facility].   Arnold Long RN MSN

## 2019-04-16 DIAGNOSIS — K5792 Diverticulitis of intestine, part unspecified, without perforation or abscess without bleeding: Secondary | ICD-10-CM

## 2019-04-16 HISTORY — DX: Diverticulitis of intestine, part unspecified, without perforation or abscess without bleeding: K57.92

## 2019-04-17 NOTE — Progress Notes (Signed)
[place] Screening performed. Temperature, PHQ-9, and need for medical care and medications assessed. Referred for assistance with PCP.   Jobe Igo MSN, RN

## 2019-04-24 ENCOUNTER — Other Ambulatory Visit: Payer: Self-pay | Admitting: *Deleted

## 2019-04-24 DIAGNOSIS — Z20822 Contact with and (suspected) exposure to covid-19: Secondary | ICD-10-CM

## 2019-04-26 NOTE — Addendum Note (Signed)
Addended by: Brigitte Pulse on: 04/26/2019 10:30 AM   Modules accepted: Orders

## 2019-04-30 ENCOUNTER — Emergency Department (HOSPITAL_COMMUNITY): Payer: Medicare Other

## 2019-04-30 ENCOUNTER — Observation Stay (HOSPITAL_BASED_OUTPATIENT_CLINIC_OR_DEPARTMENT_OTHER)
Admission: EM | Admit: 2019-04-30 | Discharge: 2019-05-01 | Disposition: A | Discharge status: Home / Self Care | Payer: Medicare Other | Source: Home / Self Care | Attending: Emergency Medicine | Admitting: Emergency Medicine

## 2019-04-30 ENCOUNTER — Encounter (HOSPITAL_COMMUNITY): Payer: Self-pay

## 2019-04-30 ENCOUNTER — Other Ambulatory Visit: Payer: Self-pay

## 2019-04-30 DIAGNOSIS — K5792 Diverticulitis of intestine, part unspecified, without perforation or abscess without bleeding: Secondary | ICD-10-CM | POA: Diagnosis present

## 2019-04-30 DIAGNOSIS — D751 Secondary polycythemia: Secondary | ICD-10-CM | POA: Diagnosis present

## 2019-04-30 DIAGNOSIS — F1010 Alcohol abuse, uncomplicated: Secondary | ICD-10-CM | POA: Insufficient documentation

## 2019-04-30 DIAGNOSIS — Z79899 Other long term (current) drug therapy: Secondary | ICD-10-CM | POA: Insufficient documentation

## 2019-04-30 DIAGNOSIS — F329 Major depressive disorder, single episode, unspecified: Secondary | ICD-10-CM | POA: Insufficient documentation

## 2019-04-30 DIAGNOSIS — K4020 Bilateral inguinal hernia, without obstruction or gangrene, not specified as recurrent: Secondary | ICD-10-CM | POA: Insufficient documentation

## 2019-04-30 DIAGNOSIS — Z982 Presence of cerebrospinal fluid drainage device: Secondary | ICD-10-CM

## 2019-04-30 DIAGNOSIS — Z0184 Encounter for antibody response examination: Secondary | ICD-10-CM | POA: Insufficient documentation

## 2019-04-30 DIAGNOSIS — K5732 Diverticulitis of large intestine without perforation or abscess without bleeding: Secondary | ICD-10-CM | POA: Insufficient documentation

## 2019-04-30 DIAGNOSIS — F419 Anxiety disorder, unspecified: Secondary | ICD-10-CM | POA: Insufficient documentation

## 2019-04-30 DIAGNOSIS — I10 Essential (primary) hypertension: Secondary | ICD-10-CM

## 2019-04-30 DIAGNOSIS — K8010 Calculus of gallbladder with chronic cholecystitis without obstruction: Secondary | ICD-10-CM | POA: Diagnosis not present

## 2019-04-30 DIAGNOSIS — K811 Chronic cholecystitis: Secondary | ICD-10-CM | POA: Insufficient documentation

## 2019-04-30 DIAGNOSIS — Z8349 Family history of other endocrine, nutritional and metabolic diseases: Secondary | ICD-10-CM | POA: Insufficient documentation

## 2019-04-30 DIAGNOSIS — E785 Hyperlipidemia, unspecified: Secondary | ICD-10-CM | POA: Insufficient documentation

## 2019-04-30 DIAGNOSIS — Z7982 Long term (current) use of aspirin: Secondary | ICD-10-CM | POA: Insufficient documentation

## 2019-04-30 DIAGNOSIS — Z885 Allergy status to narcotic agent status: Secondary | ICD-10-CM | POA: Insufficient documentation

## 2019-04-30 DIAGNOSIS — G911 Obstructive hydrocephalus: Secondary | ICD-10-CM | POA: Insufficient documentation

## 2019-04-30 DIAGNOSIS — F17210 Nicotine dependence, cigarettes, uncomplicated: Secondary | ICD-10-CM | POA: Insufficient documentation

## 2019-04-30 DIAGNOSIS — R7303 Prediabetes: Secondary | ICD-10-CM | POA: Insufficient documentation

## 2019-04-30 DIAGNOSIS — K859 Acute pancreatitis without necrosis or infection, unspecified: Secondary | ICD-10-CM

## 2019-04-30 DIAGNOSIS — Z888 Allergy status to other drugs, medicaments and biological substances status: Secondary | ICD-10-CM | POA: Insufficient documentation

## 2019-04-30 DIAGNOSIS — R1011 Right upper quadrant pain: Secondary | ICD-10-CM | POA: Diagnosis not present

## 2019-04-30 DIAGNOSIS — Z1159 Encounter for screening for other viral diseases: Secondary | ICD-10-CM | POA: Insufficient documentation

## 2019-04-30 DIAGNOSIS — Z590 Homelessness: Secondary | ICD-10-CM | POA: Insufficient documentation

## 2019-04-30 LAB — COMPREHENSIVE METABOLIC PANEL
ALT: 17 U/L (ref 0–44)
AST: 19 U/L (ref 15–41)
Albumin: 4.2 g/dL (ref 3.5–5.0)
Alkaline Phosphatase: 82 U/L (ref 38–126)
Anion gap: 11 (ref 5–15)
BUN: 13 mg/dL (ref 8–23)
CO2: 21 mmol/L — ABNORMAL LOW (ref 22–32)
Calcium: 9.3 mg/dL (ref 8.9–10.3)
Chloride: 106 mmol/L (ref 98–111)
Creatinine, Ser: 1.21 mg/dL (ref 0.61–1.24)
GFR calc Af Amer: >60 mL/min (ref >60)
GFR calc non Af Amer: >60 mL/min (ref >60)
Glucose, Bld: 119 mg/dL — ABNORMAL HIGH (ref 70–99)
Potassium: 3.8 mmol/L (ref 3.5–5.1)
Sodium: 138 mmol/L (ref 135–145)
Total Bilirubin: 0.9 mg/dL (ref 0.3–1.2)
Total Protein: 6.7 g/dL (ref 6.5–8.1)

## 2019-04-30 LAB — URINALYSIS, ROUTINE W REFLEX MICROSCOPIC
Bacteria, UA: NONE SEEN
Bilirubin Urine: NEGATIVE
Glucose, UA: NEGATIVE mg/dL
Ketones, ur: NEGATIVE mg/dL
Leukocytes,Ua: NEGATIVE
Nitrite: NEGATIVE
Protein, ur: NEGATIVE mg/dL
Specific Gravity, Urine: 1.011 (ref 1.005–1.030)
pH: 5 (ref 5.0–8.0)

## 2019-04-30 LAB — CBC
HCT: 55 % — ABNORMAL HIGH (ref 39.0–52.0)
Hemoglobin: 18.2 g/dL — ABNORMAL HIGH (ref 13.0–17.0)
MCH: 29.4 pg (ref 26.0–34.0)
MCHC: 33.1 g/dL (ref 30.0–36.0)
MCV: 88.9 fL (ref 80.0–100.0)
Platelets: 427 10*3/uL — ABNORMAL HIGH (ref 150–400)
RBC: 6.19 MIL/uL — ABNORMAL HIGH (ref 4.22–5.81)
RDW: 14 % (ref 11.5–15.5)
WBC: 11.1 10*3/uL — ABNORMAL HIGH (ref 4.0–10.5)
nRBC: 0 % (ref 0.0–0.2)

## 2019-04-30 LAB — SARS CORONAVIRUS 2 BY RT PCR (HOSPITAL ORDER, PERFORMED IN ~~LOC~~ HOSPITAL LAB): SARS Coronavirus 2: NEGATIVE

## 2019-04-30 LAB — LIPASE, BLOOD: Lipase: 108 U/L — ABNORMAL HIGH (ref 11–51)

## 2019-04-30 MED ORDER — ONDANSETRON 4 MG PO TBDP
4.0000 mg | ORAL_TABLET | Freq: Once | ORAL | Status: AC
  Administered 2019-04-30: 4 mg via ORAL
  Filled 2019-04-30: qty 1

## 2019-04-30 MED ORDER — ONDANSETRON HCL 4 MG/2ML IJ SOLN
4.0000 mg | Freq: Once | INTRAMUSCULAR | Status: DC
  Filled 2019-04-30: qty 2

## 2019-04-30 MED ORDER — ACETAMINOPHEN 325 MG PO TABS: 650.0000 mg | ORAL_TABLET | Freq: Four times a day (QID) | ORAL | Status: DC | PRN

## 2019-04-30 MED ORDER — METRONIDAZOLE IN NACL 5-0.79 MG/ML-% IV SOLN
500.0000 mg | Freq: Once | INTRAVENOUS | Status: AC
  Administered 2019-04-30: 500 mg via INTRAVENOUS
  Filled 2019-04-30: qty 100

## 2019-04-30 MED ORDER — HYDRALAZINE HCL 20 MG/ML IJ SOLN
10.0000 mg | INTRAMUSCULAR | Status: DC | PRN
  Administered 2019-04-30: 10 mg via INTRAVENOUS
  Filled 2019-04-30: qty 1

## 2019-04-30 MED ORDER — IOHEXOL 300 MG/ML  SOLN
100.0000 mL | Freq: Once | INTRAMUSCULAR | Status: AC | PRN
  Administered 2019-04-30: 100 mL via INTRAVENOUS

## 2019-04-30 MED ORDER — DICYCLOMINE HCL 10 MG/ML IM SOLN
20.0000 mg | Freq: Once | INTRAMUSCULAR | Status: AC
  Administered 2019-04-30: 20 mg via INTRAMUSCULAR
  Filled 2019-04-30: qty 2

## 2019-04-30 MED ORDER — FENTANYL CITRATE (PF) 100 MCG/2ML IJ SOLN
100.0000 ug | Freq: Once | INTRAMUSCULAR | Status: AC
  Administered 2019-04-30: 100 ug via INTRAVENOUS

## 2019-04-30 MED ORDER — FENTANYL CITRATE (PF) 100 MCG/2ML IJ SOLN
100.0000 ug | Freq: Once | INTRAMUSCULAR | Status: AC
  Administered 2019-04-30: 100 ug via INTRAVENOUS
  Filled 2019-04-30: qty 2

## 2019-04-30 MED ORDER — ONDANSETRON HCL 4 MG PO TABS: 4.0000 mg | ORAL_TABLET | Freq: Four times a day (QID) | ORAL | Status: DC | PRN

## 2019-04-30 MED ORDER — SODIUM CHLORIDE 0.9 % IV SOLN
INTRAVENOUS | Status: AC
  Administered 2019-04-30 (×2): via INTRAVENOUS

## 2019-04-30 MED ORDER — ONDANSETRON HCL 4 MG/2ML IJ SOLN
4.0000 mg | Freq: Four times a day (QID) | INTRAMUSCULAR | Status: DC | PRN
  Administered 2019-04-30: 4 mg via INTRAVENOUS
  Filled 2019-04-30: qty 2

## 2019-04-30 MED ORDER — PROMETHAZINE HCL 25 MG/ML IJ SOLN
12.5000 mg | Freq: Four times a day (QID) | INTRAMUSCULAR | Status: DC | PRN
  Administered 2019-04-30: 12.5 mg via INTRAVENOUS
  Filled 2019-04-30: qty 1

## 2019-04-30 MED ORDER — ACETAMINOPHEN 650 MG RE SUPP: 650.0000 mg | Freq: Four times a day (QID) | RECTAL | Status: DC | PRN

## 2019-04-30 MED ORDER — MORPHINE SULFATE (PF) 2 MG/ML IV SOLN
1.0000 mg | INTRAVENOUS | Status: DC | PRN
  Administered 2019-04-30: 1 mg via INTRAVENOUS
  Filled 2019-04-30: qty 1

## 2019-04-30 MED ORDER — SODIUM CHLORIDE 0.9 % IV SOLN
2.0000 g | Freq: Once | INTRAVENOUS | Status: AC
  Administered 2019-04-30: 2 g via INTRAVENOUS
  Filled 2019-04-30: qty 20

## 2019-04-30 MED ORDER — FENTANYL CITRATE (PF) 100 MCG/2ML IJ SOLN
100.0000 ug | INTRAMUSCULAR | Status: DC | PRN
  Administered 2019-04-30 (×2): 100 ug via INTRAVENOUS
  Filled 2019-04-30 (×2): qty 2

## 2019-04-30 MED ORDER — SODIUM CHLORIDE 0.9 % IV SOLN
2.0000 g | INTRAVENOUS | Status: DC
  Administered 2019-05-01: 2 g via INTRAVENOUS
  Filled 2019-04-30: qty 20
  Filled 2019-04-30: qty 2

## 2019-04-30 MED ORDER — SODIUM CHLORIDE 0.9% FLUSH
3.0000 mL | Freq: Once | INTRAVENOUS | Status: AC
  Administered 2019-04-30: 3 mL via INTRAVENOUS

## 2019-04-30 MED ORDER — METRONIDAZOLE IN NACL 5-0.79 MG/ML-% IV SOLN
500.0000 mg | Freq: Three times a day (TID) | INTRAVENOUS | Status: DC
  Administered 2019-04-30 – 2019-05-01 (×4): 500 mg via INTRAVENOUS
  Filled 2019-04-30 (×5): qty 100

## 2019-04-30 MED ORDER — ONDANSETRON HCL 4 MG/2ML IJ SOLN
4.0000 mg | Freq: Once | INTRAMUSCULAR | Status: AC
  Administered 2019-04-30: 4 mg via INTRAVENOUS

## 2019-04-30 MED ORDER — FENTANYL CITRATE (PF) 100 MCG/2ML IJ SOLN
100.0000 ug | Freq: Once | INTRAMUSCULAR | Status: DC
  Filled 2019-04-30: qty 2

## 2019-04-30 NOTE — Consult Note (Signed)
Luis Henderson 12/07/1955  [id number].    Requesting MD: Dr. Gean Birchwood Chief Complaint/Reason for Consult: diverticulitis, chronic cholecystitis  HPI:  This is a 63 yo white male who states he is essentially homeless and just received disability secondary to hydrocephalus for which he had a VP shunt placed in 2017.  He has been having RUQ abdominal pain with N/V after eating greasy fatty foods for years and years.  In our system this has been evaluated several years ago with gallbladder wall thickening and a HIDA scan that reveals no evidence of cholecystitis or stone, but decrease in EF.  The patient has never followed up.  He was in [place] for a while and had it evaluated up there as well with the same findings he states.  He also states he has 2 inguinal hernias that cause him symptoms as well, but when clarified his pain in his abdomen rather than in his groin.  Which leads to this pain worsening over the last couple of days with minimal oral intake or BMs.  His pain is sharp and crampy at times.  He denies any fevers or chills.  He presented to the ED where he had a CT scan that revealed focal diverticulitis of his sigmoid colon with no evidence of perforation, abscess, or any other complicating features.  His gallbladder was also found to be stable from his last imaging in 2018.  His WBC is minimally elevated at 11K.  His LFTs are normal.  We have been asked to see him for further evaluation and recommendations.  ROS: ROS: Please see HPI, otherwise all other systems have been reviewed and are negative.  Family History  Problem Relation Age of Onset   Hyperlipidemia Father     Past Medical History:  Diagnosis Date   Anxiety    Depression    History of prediabetes    Hyperlipidemia    Hypertension     Past Surgical History:  Procedure Laterality Date   CYSTOSCOPY WITH RETROGRADE PYELOGRAM, URETEROSCOPY AND STENT PLACEMENT Right 01/15/2015   Procedure:  CYSTOSCOPY, RIGHT URETEROSCOPY/ RETROGRADE PYELOGRAM,HOLMIUM LASER/ LITHOTRIPSY, STENT PLACEMENT, URETERAL BALLOON DIALATION;  Surgeon: Kathie Rhodes, MD;  Location: [facility];  Service: Urology;  Laterality: Right;   SKIN GRAFT     left hand   VENTRICULOPERITONEAL SHUNT Right 09/04/2016   Procedure: SHUNT INSERTION VENTRICULAR-PERITONEAL;  Surgeon: Newman Pies, MD;  Location: [facility];  Service: Neurosurgery;  Laterality: Right;   WISDOM TOOTH EXTRACTION      Social History:  reports that he has been smoking cigarettes. He has a 28.00 pack-year smoking history. He has never used smokeless tobacco. He reports current alcohol use of about 5.0 - 6.0 standard drinks of alcohol per week. He reports current drug use. Drug: Marijuana.  Allergies:  Allergies  Allergen Reactions   Codeine Itching   Lisinopril Cough    Medications Prior to Admission  Medication Sig Dispense Refill   amLODipine (NORVASC) 10 MG tablet Take 1 tablet (10 mg total) by mouth daily. 30 tablet 3   aspirin EC 81 MG tablet Take 81 mg by mouth daily.     albuterol (PROVENTIL HFA;VENTOLIN HFA) 108 (90 Base) MCG/ACT inhaler Inhale 2 puffs into the lungs every 6 (six) hours as needed for wheezing or shortness of breath. (Patient not taking: Reported on 02/04/2019) 1 Inhaler 2   escitalopram (LEXAPRO) 10 MG tablet Take 1 tablet (10 mg total) by mouth daily. (Patient not taking: Reported on 02/04/2019)  30 tablet 3   prochlorperazine (COMPAZINE) 10 MG tablet Take 1 tablet (10 mg total) by mouth every 6 (six) hours as needed for nausea or vomiting. (Patient not taking: Reported on 02/04/2019) 30 tablet 1     Physical Exam: Blood pressure (!) 165/83, pulse 62, temperature 98.2 F (36.8 C), temperature source Oral, resp. rate 18, SpO2 98 %. General: pleasant, WD, WN white male who is laying in bed in NAD HEENT: head is normocephalic, atraumatic.  Sclera are noninjected.  PERRL.  Ears and nose without any masses or lesions.   Mouth is pink and moist Heart: regular, rate, and rhythm.  Normal s1,s2. No obvious murmurs, gallops, or rubs noted.  Palpable radial and pedal pulses bilaterally Lungs: CTAB, no wheezes, rhonchi, or rales noted.  Respiratory effort nonlabored Abd: soft, minimally tender in RUQ.  Mostly tender in LLQ and lower central abdomen with some mild guarding to deep palpation, ND, +BS, no masses or organomegaly.  He has small reducible umbilical and bilateral inguinal hernias. MS: all 4 extremities are symmetrical with no cyanosis, clubbing, or edema. Skin: warm and dry with no masses, lesions, or rashes Psych: A&Ox3 with an appropriate affect.   Results for orders placed or performed during the hospital encounter of 04/30/19 (from the past 48 hour(s))  Lipase, blood     Status: Abnormal   Collection Time: 04/30/19 12:44 AM  Result Value Ref Range   Lipase 108 (H) 11 - 51 U/L    Comment: Performed at [facility], [address] [place], [place] [address]  Comprehensive metabolic panel     Status: Abnormal   Collection Time: 04/30/19 12:44 AM  Result Value Ref Range   Sodium 138 135 - 145 mmol/L   Potassium 3.8 3.5 - 5.1 mmol/L   Chloride 106 98 - 111 mmol/L   CO2 21 (L) 22 - 32 mmol/L   Glucose, Bld 119 (H) 70 - 99 mg/dL   BUN 13 8 - 23 mg/dL   Creatinine, Ser 1.21 0.61 - 1.24 mg/dL   Calcium 9.3 8.9 - 10.3 mg/dL   Total Protein 6.7 6.5 - 8.1 g/dL   Albumin 4.2 3.5 - 5.0 g/dL   AST 19 15 - 41 U/L   ALT 17 0 - 44 U/L   Alkaline Phosphatase 82 38 - 126 U/L   Total Bilirubin 0.9 0.3 - 1.2 mg/dL   GFR calc non Af Amer >60 >60 mL/min   GFR calc Af Amer >60 >60 mL/min   Anion gap 11 5 - 15    Comment: Performed at [facility], [place] [address], [place], [place] [address]  CBC     Status: Abnormal   Collection Time: 04/30/19 12:44 AM  Result Value Ref Range   WBC 11.1 (H) 4.0 - 10.5 K/uL   RBC 6.19 (H) 4.22 - 5.81 MIL/uL   Hemoglobin 18.2 (H) 13.0 - 17.0 g/dL   HCT 55.0 (H)  39.0 - 52.0 %   MCV 88.9 80.0 - 100.0 fL   MCH 29.4 26.0 - 34.0 pg   MCHC 33.1 30.0 - 36.0 g/dL   RDW 14.0 11.5 - 15.5 %   Platelets 427 (H) 150 - 400 K/uL   nRBC 0.0 0.0 - 0.2 %    Comment: Performed at [facility], [place] [address], [place], [place] [address]  Urinalysis, Routine w reflex microscopic     Status: Abnormal   Collection Time: 04/30/19 12:44 AM  Result Value Ref  Range   Color, Urine YELLOW YELLOW   APPearance CLEAR CLEAR   Specific Gravity, Urine 1.011 1.005 - 1.030   pH 5.0 5.0 - 8.0   Glucose, UA NEGATIVE NEGATIVE mg/dL   Hgb urine dipstick SMALL (A) NEGATIVE   Bilirubin Urine NEGATIVE NEGATIVE   Ketones, ur NEGATIVE NEGATIVE mg/dL   Protein, ur NEGATIVE NEGATIVE mg/dL   Nitrite NEGATIVE NEGATIVE   Leukocytes,Ua NEGATIVE NEGATIVE   WBC, UA 0-5 0 - 5 WBC/hpf   Bacteria, UA NONE SEEN NONE SEEN   Mucus PRESENT     Comment: Performed at [facility] [address] [place], [place] [address]  SARS Coronavirus 2 (CEPHEID - Performed in [facility] hospital lab), Hosp Order     Status: None   Collection Time: 04/30/19  4:30 AM   Specimen: Nasopharyngeal Swab  Result Value Ref Range   SARS Coronavirus 2 NEGATIVE NEGATIVE    Comment: (NOTE) If result is NEGATIVE SARS-CoV-2 target nucleic acids are NOT DETECTED. The SARS-CoV-2 RNA is generally detectable in upper and lower  respiratory specimens during the acute phase of infection. The lowest  concentration of SARS-CoV-2 viral copies this assay can detect is 250  copies / mL. A negative result does not preclude SARS-CoV-2 infection  and should not be used as the sole basis for treatment or other  patient management decisions.  A negative result may occur with  improper specimen collection / handling, submission of specimen other  than nasopharyngeal swab, presence of viral mutation(s) within the  areas targeted by this assay, and inadequate number of viral copies  (<250 copies / mL). A negative result  must be combined with clinical  observations, patient history, and epidemiological information. If result is POSITIVE SARS-CoV-2 target nucleic acids are DETECTED. The SARS-CoV-2 RNA is generally detectable in upper and lower  respiratory specimens dur ing the acute phase of infection.  Positive  results are indicative of active infection with SARS-CoV-2.  Clinical  correlation with patient history and other diagnostic information is  necessary to determine patient infection status.  Positive results do  not rule out bacterial infection or co-infection with other viruses. If result is PRESUMPTIVE POSTIVE SARS-CoV-2 nucleic acids MAY BE PRESENT.   A presumptive positive result was obtained on the submitted specimen  and confirmed on repeat testing.  While 2019 novel coronavirus  (SARS-CoV-2) nucleic acids may be present in the submitted sample  additional confirmatory testing may be necessary for epidemiological  and / or clinical management purposes  to differentiate between  SARS-CoV-2 and other Sarbecovirus currently known to infect humans.  If clinically indicated additional testing with an alternate test  methodology [id number]) is advised. The SARS-CoV-2 RNA is generally  detectable in upper and lower respiratory sp ecimens during the acute  phase of infection. The expected result is Negative. Fact Sheet for Patients:  StrictlyIdeas.no Fact Sheet for Healthcare Providers: BankingDealers.co.za This test is not yet approved or cleared by the [place] FDA and has been authorized for detection and/or diagnosis of SARS-CoV-2 by FDA under an Emergency Use Authorization (EUA).  This EUA will remain in effect (meaning this test can be used) for the duration of the COVID-19 declaration under Section 564(b)(1) of the Act, 21 U.S.C. section 360bbb-3(b)(1), unless the authorization is terminated or revoked sooner. Performed at [facility], [place] [address], [place], [place] [address]    US Abdomen Complete  Result Date: 04/30/2019 CLINICAL DATA:  Abdominal pain EXAM: ABDOMEN ULTRASOUND  COMPLETE COMPARISON:  CT from earlier today FINDINGS: Gallbladder: Thick-walled gallbladder without appreciable color Doppler flow, somewhat irregular appearing. There is an 11 mm polyp. No focal tenderness or calcified stone. Common bile duct: Diameter: 6 mm Liver: No focal lesion identified. Within normal limits in parenchymal echogenicity. Portal vein is patent on color Doppler imaging with normal direction of blood flow towards the liver. IVC: No abnormality visualized. Pancreas: Not visualized by ultrasound but seen on prior CT. Spleen: Size and appearance within normal limits. Right Kidney: Length: 11.5 cm. Echogenicity within normal limits. No mass or hydronephrosis visualized. Left Kidney: Length: 13.3 cm. Asymmetric enlargement attributed to a 8 cm cyst with thin benign-appearing septation that is nonenhancing by CT and stable. No hydronephrosis. Abdominal aorta: Not visualized sonographically. There is atherosclerosis without aneurysm by CT Other findings: None. IMPRESSION: 1. Irregular thick walled gallbladder with similar appearance in 2018 favoring chronic cholecystitis over mass, but need surgical referral. There is an 11 mm polyp which at a minimum would need follow-up; enhanced abdominal MRI may be contributory. 2. No evidence acute cholecystitis. Electronically Signed   By: Monte Fantasia M.D.   On: 04/30/2019 04:23   Ct Abdomen Pelvis W Contrast  Result Date: 04/30/2019 CLINICAL DATA:  63 year old male with abdominal pain, constipation. EXAM: CT ABDOMEN AND PELVIS WITH CONTRAST TECHNIQUE: Multidetector CT imaging of the abdomen and pelvis was performed using the standard protocol following bolus administration of intravenous contrast. CONTRAST:  149mL OMNIPAQUE IOHEXOL 300 MG/ML  SOLN COMPARISON:  CT Abdomen and Pelvis 01/13/2015. CTA  chest 09/29/2017. FINDINGS: Lower chest: Negative aside from small hiatal hernia which was apparent in 2018. Hepatobiliary: Abnormal gallbladder is indistinct and hyperenhancing with regional small volume fluid or edema. See series 3, image 20. However, the gallbladder is nondistended. Gallbladder might of been similarly abnormal in 2018 (with wall thickening by ultrasound at that time), but appeared normal in 2016. There is no bile duct enlargement. Liver enhancement remains stable, normal. Pancreas: Negative. Spleen: Negative. Adrenals/Urinary Tract: Large 9 centimeter exophytic left renal cyst with simple fluid density. Right nephrolithiasis, 7 millimeters. Punctate left nephrolithiasis. Symmetric renal enhancement and contrast excretion with normal proximal ureters. Unremarkable urinary bladder. Stomach/Bowel: Negative rectum. Mild to moderate inflammatory stranding along the left lateral margin of the sigmoid colon on series 3, image 63 where a 15 millimeter sigmoid diverticula is noted. Mild if any associated regional sigmoid wall thickening. Diverticulosis continues proximally to the right colon with no other active inflammation. Normal retrocecal appendix. Negative terminal ileum. No dilated small bowel. There is wiring or catheter tubing which tracks into the ventral upper abdomen from the right lower chest and is stable since 2018. Negative intraabdominal stomach. Small duodenal diverticulum on series 3, image 25. No free air. No free fluid. Vascular/Lymphatic: Extensive Aortoiliac calcified atherosclerosis. Major arterial structures are patent. Portal venous system is patent. No lymphadenopathy, including at the porta hepatis. Reproductive: Chronic fat containing inguinal hernias are stable. Otherwise negative. Other: No pelvic free fluid. Musculoskeletal: Stable grade 1 anterolisthesis at L4-L5 with severe lower lumbar facet degeneration. No acute osseous abnormality identified. IMPRESSION: 1. Mild acute  inflammation associated with mid sigmoid colon diverticula compatible focal diverticulitis. No complicating features. Widespread underlying large bowel diverticulosis. 2. Abnormal gallbladder which appears highly thickened and irregular, but may not be significantly changed since 2018. Considering the nonspecific ultrasound appearance of the gallbladder at that time, a follow-up Abdomen MRI (liver protocol without and with contrast) might best evaluate further. 3. Small gastric hernia. Nephrolithiasis. Aortic  Atherosclerosis (ICD10-I70.0). Electronically Signed   By: Genevie Ann M.D.   On: 04/30/2019 03:09      Assessment/Plan Umbilical and bilateral inguinal hernias - outpatient follow up Hydrocephalus with VP shunt Polycythemia vera - ASA a day Tobacco abuse HTN  Chronic acalculous cholecystitis - in the acute setting of another infection without evidence of acute cholecystitis, we would defer cholecystectomy.  We would recommend given his symptoms as well as his diagnostics findings that he follow up with [place] as an outpatient to discuss elective resection once his diverticulitis resolves.  His CT scan findings have been stable for at least the last 2 years along with normal labs suggesting no acute change  Uncomplicated focal diverticulitis - agree with NPO status for bowel rest at this time along with abx therapy. No complicating features noted on his CT scan that require any surgical intervention.  Will defer further medical management to the medical service.  Thank you for this consultation.  We will sign off and recommend outpatient follow up for his gallbladder as well as his hernias once his diverticulitis resolves.   Henreitta Cea, [facility] 04/30/2019, 8:00 AM Pager: [phone]

## 2019-04-30 NOTE — Progress Notes (Signed)
Pt requesting something for sleep. States he doesn't take anything at home but wants "something stronger than melatonin". States "you might as well blow smoke up my ass if you're going to give me that." MD on call paged.

## 2019-04-30 NOTE — Progress Notes (Signed)
PROGRESS NOTE    Luis Henderson  ZYS:[id number] DOB: 01/06/1956 DOA: 04/30/2019 PCP: Marliss Coots, NP    Brief Narrative:  63 year old male admitted with abdominal pain and vomiting.  Found to have acute sigmoid diverticulitis.  Admitted for intravenous antibiotics, bowel rest and pain management.   Assessment & Plan:   Principal Problem:   Diverticulitis Active Problems:   HTN (hypertension)   S/P VP shunt   Polycythemia   Acute diverticulitis   1. Acute sigmoid diverticulitis.  Patient continues to have significant abdominal pain and vomiting.  Unable to keep venting down by mouth.  Continue intravenous antibiotics.  Continue bowel rest. 2. Chronic cholecystitis.  Seen by general surgery with plans for outpatient surgical evaluation for cholecystectomy. 3. Elevated blood pressure.  Acute pain episode likely contributing.  Continue to monitor for now and treat as needed. 4. History of obstructive hydrocephalus.  Status post VP shunt.   DVT prophylaxis:SCDs Code Status: full code Family Communication: none Disposition Plan: discharge home once symptoms have improved and tolerating diet   Consultants:   General surgery  Procedures:     Antimicrobials:   Ceftriaxone 7/15>  Flagyl 7/15>    Subjective: Continues to have abdominal pain and vomiting  Objective: Vitals:   04/30/19 0545 04/30/19 0618 04/30/19 1105 04/30/19 1141  BP: 129/73 (!) 165/83 (!) 190/106 (!) 181/91  Pulse: 65 62 79 82  Resp:  18    Temp:  98.2 F (36.8 C) 98.4 F (36.9 C)   TempSrc:  Oral Oral   SpO2: 95% 98% 92% 99%    Intake/Output Summary (Last 24 hours) at 04/30/2019 1804 Last data filed at 04/30/2019 1105 Gross per 24 hour  Intake 533.2 ml  Output 950 ml  Net -416.8 ml   There were no vitals filed for this visit.  Examination:  General exam: Appears calm and comfortable  Respiratory system: Clear to auscultation. Respiratory effort normal. Cardiovascular system:  S1 & S2 heard, RRR. No JVD, murmurs, rubs, gallops or clicks. No pedal edema. Gastrointestinal system: Abdomen is nondistended, soft and tender in RLQ. No organomegaly or masses felt. Normal bowel sounds heard. Central nervous system: Alert and oriented. No focal neurological deficits. Extremities: Symmetric 5 x 5 power. Skin: No rashes, lesions or ulcers Psychiatry: Judgement and insight appear normal. Mood & affect appropriate.     Data Reviewed: I have personally reviewed following labs and imaging studies  CBC: Recent Labs  Lab 04/30/19 0044  WBC 11.1*  HGB 18.2*  HCT 55.0*  MCV 88.9  PLT 932*   Basic Metabolic Panel: Recent Labs  Lab 04/30/19 0044  NA 138  K 3.8  CL 106  CO2 21*  GLUCOSE 119*  BUN 13  CREATININE 1.21  CALCIUM 9.3   GFR: CrCl cannot be calculated (Unknown ideal weight.). Liver Function Tests: Recent Labs  Lab 04/30/19 0044  AST 19  ALT 17  ALKPHOS 82  BILITOT 0.9  PROT 6.7  ALBUMIN 4.2   Recent Labs  Lab 04/30/19 0044  LIPASE 108*   No results for input(s): AMMONIA in the last 168 hours. Coagulation Profile: No results for input(s): INR, PROTIME in the last 168 hours. Cardiac Enzymes: No results for input(s): CKTOTAL, CKMB, CKMBINDEX, TROPONINI in the last 168 hours. BNP (last 3 results) No results for input(s): PROBNP in the last 8760 hours. HbA1C: No results for input(s): HGBA1C in the last 72 hours. CBG: No results for input(s): GLUCAP in the last 168 hours. Lipid Profile: No  results for input(s): CHOL, HDL, LDLCALC, TRIG, CHOLHDL, LDLDIRECT in the last 72 hours. Thyroid Function Tests: No results for input(s): TSH, T4TOTAL, FREET4, T3FREE, THYROIDAB in the last 72 hours. Anemia Panel: No results for input(s): VITAMINB12, FOLATE, FERRITIN, TIBC, IRON, RETICCTPCT in the last 72 hours. Sepsis Labs: No results for input(s): PROCALCITON, LATICACIDVEN in the last 168 hours.  Recent Results (from the past 240 hour(s))  SARS  Coronavirus 2 (CEPHEID - Performed in [place] hospital lab), Hosp Order     Status: None   Collection Time: 04/30/19  4:30 AM   Specimen: Nasopharyngeal Swab  Result Value Ref Range Status   SARS Coronavirus 2 NEGATIVE NEGATIVE Final    Comment: (NOTE) If result is NEGATIVE SARS-CoV-2 target nucleic acids are NOT DETECTED. The SARS-CoV-2 RNA is generally detectable in upper and lower  respiratory specimens during the acute phase of infection. The lowest  concentration of SARS-CoV-2 viral copies this assay can detect is 250  copies / mL. A negative result does not preclude SARS-CoV-2 infection  and should not be used as the sole basis for treatment or other  patient management decisions.  A negative result may occur with  improper specimen collection / handling, submission of specimen other  than nasopharyngeal swab, presence of viral mutation(s) within the  areas targeted by this assay, and inadequate number of viral copies  (<250 copies / mL). A negative result must be combined with clinical  observations, patient history, and epidemiological information. If result is POSITIVE SARS-CoV-2 target nucleic acids are DETECTED. The SARS-CoV-2 RNA is generally detectable in upper and lower  respiratory specimens dur ing the acute phase of infection.  Positive  results are indicative of active infection with SARS-CoV-2.  Clinical  correlation with patient history and other diagnostic information is  necessary to determine patient infection status.  Positive results do  not rule out bacterial infection or co-infection with other viruses. If result is PRESUMPTIVE POSTIVE SARS-CoV-2 nucleic acids MAY BE PRESENT.   A presumptive positive result was obtained on the submitted specimen  and confirmed on repeat testing.  While 2019 novel coronavirus  (SARS-CoV-2) nucleic acids may be present in the submitted sample  additional confirmatory testing may be necessary for epidemiological  and / or  clinical management purposes  to differentiate between  SARS-CoV-2 and other Sarbecovirus currently known to infect humans.  If clinically indicated additional testing with an alternate test  methodology [phone]) is advised. The SARS-CoV-2 RNA is generally  detectable in upper and lower respiratory sp ecimens during the acute  phase of infection. The expected result is Negative. Fact Sheet for Patients:  StrictlyIdeas.no Fact Sheet for Healthcare Providers: BankingDealers.co.za This test is not yet approved or cleared by the [place] FDA and has been authorized for detection and/or diagnosis of SARS-CoV-2 by FDA under an Emergency Use Authorization (EUA).  This EUA will remain in effect (meaning this test can be used) for the duration of the COVID-19 declaration under Section 564(b)(1) of the Act, 21 U.S.C. section 360bbb-3(b)(1), unless the authorization is terminated or revoked sooner. Performed at [facility], [place] [address], [place], [place] [address]          Radiology Studies: US Abdomen Complete  Result Date: 04/30/2019 CLINICAL DATA:  Abdominal pain EXAM: ABDOMEN ULTRASOUND COMPLETE COMPARISON:  CT from earlier today FINDINGS: Gallbladder: Thick-walled gallbladder without appreciable color Doppler flow, somewhat irregular appearing. There is an 11 mm polyp. No focal tenderness or calcified stone. Common bile  duct: Diameter: 6 mm Liver: No focal lesion identified. Within normal limits in parenchymal echogenicity. Portal vein is patent on color Doppler imaging with normal direction of blood flow towards the liver. IVC: No abnormality visualized. Pancreas: Not visualized by ultrasound but seen on prior CT. Spleen: Size and appearance within normal limits. Right Kidney: Length: 11.5 cm. Echogenicity within normal limits. No mass or hydronephrosis visualized. Left Kidney: Length: 13.3 cm. Asymmetric enlargement attributed to  a 8 cm cyst with thin benign-appearing septation that is nonenhancing by CT and stable. No hydronephrosis. Abdominal aorta: Not visualized sonographically. There is atherosclerosis without aneurysm by CT Other findings: None. IMPRESSION: 1. Irregular thick walled gallbladder with similar appearance in 2018 favoring chronic cholecystitis over mass, but need surgical referral. There is an 11 mm polyp which at a minimum would need follow-up; enhanced abdominal MRI may be contributory. 2. No evidence acute cholecystitis. Electronically Signed   By: Monte Fantasia M.D.   On: 04/30/2019 04:23   Ct Abdomen Pelvis W Contrast  Result Date: 04/30/2019 CLINICAL DATA:  63 year old male with abdominal pain, constipation. EXAM: CT ABDOMEN AND PELVIS WITH CONTRAST TECHNIQUE: Multidetector CT imaging of the abdomen and pelvis was performed using the standard protocol following bolus administration of intravenous contrast. CONTRAST:  111mL OMNIPAQUE IOHEXOL 300 MG/ML  SOLN COMPARISON:  CT Abdomen and Pelvis 01/13/2015. CTA chest 09/29/2017. FINDINGS: Lower chest: Negative aside from small hiatal hernia which was apparent in 2018. Hepatobiliary: Abnormal gallbladder is indistinct and hyperenhancing with regional small volume fluid or edema. See series 3, image 20. However, the gallbladder is nondistended. Gallbladder might of been similarly abnormal in 2018 (with wall thickening by ultrasound at that time), but appeared normal in 2016. There is no bile duct enlargement. Liver enhancement remains stable, normal. Pancreas: Negative. Spleen: Negative. Adrenals/Urinary Tract: Large 9 centimeter exophytic left renal cyst with simple fluid density. Right nephrolithiasis, 7 millimeters. Punctate left nephrolithiasis. Symmetric renal enhancement and contrast excretion with normal proximal ureters. Unremarkable urinary bladder. Stomach/Bowel: Negative rectum. Mild to moderate inflammatory stranding along the left lateral margin of the  sigmoid colon on series 3, image 63 where a 15 millimeter sigmoid diverticula is noted. Mild if any associated regional sigmoid wall thickening. Diverticulosis continues proximally to the right colon with no other active inflammation. Normal retrocecal appendix. Negative terminal ileum. No dilated small bowel. There is wiring or catheter tubing which tracks into the ventral upper abdomen from the right lower chest and is stable since 2018. Negative intraabdominal stomach. Small duodenal diverticulum on series 3, image 25. No free air. No free fluid. Vascular/Lymphatic: Extensive Aortoiliac calcified atherosclerosis. Major arterial structures are patent. Portal venous system is patent. No lymphadenopathy, including at the porta hepatis. Reproductive: Chronic fat containing inguinal hernias are stable. Otherwise negative. Other: No pelvic free fluid. Musculoskeletal: Stable grade 1 anterolisthesis at L4-L5 with severe lower lumbar facet degeneration. No acute osseous abnormality identified. IMPRESSION: 1. Mild acute inflammation associated with mid sigmoid colon diverticula compatible focal diverticulitis. No complicating features. Widespread underlying large bowel diverticulosis. 2. Abnormal gallbladder which appears highly thickened and irregular, but may not be significantly changed since 2018. Considering the nonspecific ultrasound appearance of the gallbladder at that time, a follow-up Abdomen MRI (liver protocol without and with contrast) might best evaluate further. 3. Small gastric hernia. Nephrolithiasis. Aortic Atherosclerosis (ICD10-I70.0). Electronically Signed   By: Genevie Ann M.D.   On: 04/30/2019 03:09        Scheduled Meds: Continuous Infusions: . sodium chloride 100 mL/hr at  04/30/19 0806  . [START ON 05/01/2019] cefTRIAXone (ROCEPHIN)  IV    . metronidazole 500 mg (04/30/19 1731)     LOS: 0 days    Time spent: 34mins    Kathie Dike, MD [facility]   If 7PM-7AM, please  contact night-coverage [email or web address]  04/30/2019, 6:04 PM

## 2019-04-30 NOTE — Plan of Care (Signed)

## 2019-04-30 NOTE — ED Notes (Signed)
ED TO INPATIENT HANDOFF REPORT  ED Nurse Name and Phone #: Lutricia Feil  S Name/Age/Gender Luis Henderson 63 y.o. male Room/Bed: 035C/035C  Code Status   Code Status: Prior  Home/SNF/Other Home Patient oriented to: self, place, time and situation Is this baseline? Yes   Triage Complete: Triage complete  Chief Complaint Constipation (For 3 Days), Vomitting, Nausea  Triage Note Pt states that he has not had a BM in 3 days, c/o of n/v, pt thinks that he has a bowel blockage.    Allergies Allergies  Allergen Reactions  . Codeine Itching  . Lisinopril Cough    Level of Care/Admitting Diagnosis ED Disposition    ED Disposition Condition [facility] Area: [place] [100100]  Level of Care: Telemetry Medical [104]  I expect the patient will be discharged within 24 hours: No (not a candidate for 5C-Observation unit)  Covid Evaluation: Asymptomatic Screening Protocol (No Symptoms)  Diagnosis: Diverticulitis [270350]  Admitting Physician: Rise Patience [phone]  Attending Physician: [name]  PT Class (Do Not Modify): Observation [104]  PT Acc Code (Do Not Modify): Observation [10022]       B Medical/Surgery History Past Medical History:  Diagnosis Date  . Anxiety   . Depression   . History of prediabetes   . Hyperlipidemia   . Hypertension    Past Surgical History:  Procedure Laterality Date  . CYSTOSCOPY WITH RETROGRADE PYELOGRAM, URETEROSCOPY AND STENT PLACEMENT Right 01/15/2015   Procedure: CYSTOSCOPY, RIGHT URETEROSCOPY/ RETROGRADE PYELOGRAM,HOLMIUM LASER/ LITHOTRIPSY, STENT PLACEMENT, URETERAL BALLOON DIALATION;  Surgeon: Kathie Rhodes, MD;  Location: [facility];  Service: Urology;  Laterality: Right;  . SKIN GRAFT     left hand  . VENTRICULOPERITONEAL SHUNT Right 09/04/2016   Procedure: SHUNT INSERTION VENTRICULAR-PERITONEAL;  Surgeon: Newman Pies, MD;  Location: [facility];  Service: Neurosurgery;   Laterality: Right;  . WISDOM TOOTH EXTRACTION       A IV Location/Drains/Wounds Patient Lines/Drains/Airways Status   Active Line/Drains/Airways    Name:   Placement date:   Placement time:   Site:   Days:   Peripheral IV 04/30/19 Right Antecubital   04/30/19    0217    Antecubital   less than 1          Intake/Output Last 24 hours  Intake/Output Summary (Last 24 hours) at 04/30/2019 0532 Last data filed at 04/30/2019 0436 Gross per 24 hour  Intake 201.51 ml  Output -  Net 201.51 ml    Labs/Imaging Results for orders placed or performed during the hospital encounter of 04/30/19 (from the past 48 hour(s))  Lipase, blood     Status: Abnormal   Collection Time: 04/30/19 12:44 AM  Result Value Ref Range   Lipase 108 (H) 11 - 51 U/L    Comment: Performed at [facility], [address] [place], [place] [address]  Comprehensive metabolic panel     Status: Abnormal   Collection Time: 04/30/19 12:44 AM  Result Value Ref Range   Sodium 138 135 - 145 mmol/L   Potassium 3.8 3.5 - 5.1 mmol/L   Chloride 106 98 - 111 mmol/L   CO2 21 (L) 22 - 32 mmol/L   Glucose, Bld 119 (H) 70 - 99 mg/dL   BUN 13 8 - 23 mg/dL   Creatinine, Ser 1.21 0.61 - 1.24 mg/dL   Calcium 9.3 8.9 - 10.3 mg/dL   Total Protein 6.7 6.5 - 8.1 g/dL  Albumin 4.2 3.5 - 5.0 g/dL   AST 19 15 - 41 U/L   ALT 17 0 - 44 U/L   Alkaline Phosphatase 82 38 - 126 U/L   Total Bilirubin 0.9 0.3 - 1.2 mg/dL   GFR calc non Af Amer >60 >60 mL/min   GFR calc Af Amer >60 >60 mL/min   Anion gap 11 5 - 15    Comment: Performed at [facility] [address], [place], [place] [address]  CBC     Status: Abnormal   Collection Time: 04/30/19 12:44 AM  Result Value Ref Range   WBC 11.1 (H) 4.0 - 10.5 K/uL   RBC 6.19 (H) 4.22 - 5.81 MIL/uL   Hemoglobin 18.2 (H) 13.0 - 17.0 g/dL   HCT 55.0 (H) 39.0 - 52.0 %   MCV 88.9 80.0 - 100.0 fL   MCH 29.4 26.0 - 34.0 pg   MCHC 33.1 30.0 - 36.0 g/dL   RDW 14.0 11.5 - 15.5 %    Platelets 427 (H) 150 - 400 K/uL   nRBC 0.0 0.0 - 0.2 %    Comment: Performed at [facility], [place] [address], [place], [place] [address]  Urinalysis, Routine w reflex microscopic     Status: Abnormal   Collection Time: 04/30/19 12:44 AM  Result Value Ref Range   Color, Urine YELLOW YELLOW   APPearance CLEAR CLEAR   Specific Gravity, Urine 1.011 1.005 - 1.030   pH 5.0 5.0 - 8.0   Glucose, UA NEGATIVE NEGATIVE mg/dL   Hgb urine dipstick SMALL (A) NEGATIVE   Bilirubin Urine NEGATIVE NEGATIVE   Ketones, ur NEGATIVE NEGATIVE mg/dL   Protein, ur NEGATIVE NEGATIVE mg/dL   Nitrite NEGATIVE NEGATIVE   Leukocytes,Ua NEGATIVE NEGATIVE   WBC, UA 0-5 0 - 5 WBC/hpf   Bacteria, UA NONE SEEN NONE SEEN   Mucus PRESENT     Comment: Performed at [facility] [address] [place], [place] [address]   US Abdomen Complete  Result Date: 04/30/2019 CLINICAL DATA:  Abdominal pain EXAM: ABDOMEN ULTRASOUND COMPLETE COMPARISON:  CT from earlier today FINDINGS: Gallbladder: Thick-walled gallbladder without appreciable color Doppler flow, somewhat irregular appearing. There is an 11 mm polyp. No focal tenderness or calcified stone. Common bile duct: Diameter: 6 mm Liver: No focal lesion identified. Within normal limits in parenchymal echogenicity. Portal vein is patent on color Doppler imaging with normal direction of blood flow towards the liver. IVC: No abnormality visualized. Pancreas: Not visualized by ultrasound but seen on prior CT. Spleen: Size and appearance within normal limits. Right Kidney: Length: 11.5 cm. Echogenicity within normal limits. No mass or hydronephrosis visualized. Left Kidney: Length: 13.3 cm. Asymmetric enlargement attributed to a 8 cm cyst with thin benign-appearing septation that is nonenhancing by CT and stable. No hydronephrosis. Abdominal aorta: Not visualized sonographically. There is atherosclerosis without aneurysm by CT Other findings: None. IMPRESSION: 1. Irregular  thick walled gallbladder with similar appearance in 2018 favoring chronic cholecystitis over mass, but need surgical referral. There is an 11 mm polyp which at a minimum would need follow-up; enhanced abdominal MRI may be contributory. 2. No evidence acute cholecystitis. Electronically Signed   By: Monte Fantasia M.D.   On: 04/30/2019 04:23   Ct Abdomen Pelvis W Contrast  Result Date: 04/30/2019 CLINICAL DATA:  63 year old male with abdominal pain, constipation. EXAM: CT ABDOMEN AND PELVIS WITH CONTRAST TECHNIQUE: Multidetector CT imaging of the abdomen and pelvis was performed using the standard  protocol following bolus administration of intravenous contrast. CONTRAST:  182mL OMNIPAQUE IOHEXOL 300 MG/ML  SOLN COMPARISON:  CT Abdomen and Pelvis 01/13/2015. CTA chest 09/29/2017. FINDINGS: Lower chest: Negative aside from small hiatal hernia which was apparent in 2018. Hepatobiliary: Abnormal gallbladder is indistinct and hyperenhancing with regional small volume fluid or edema. See series 3, image 20. However, the gallbladder is nondistended. Gallbladder might of been similarly abnormal in 2018 (with wall thickening by ultrasound at that time), but appeared normal in 2016. There is no bile duct enlargement. Liver enhancement remains stable, normal. Pancreas: Negative. Spleen: Negative. Adrenals/Urinary Tract: Large 9 centimeter exophytic left renal cyst with simple fluid density. Right nephrolithiasis, 7 millimeters. Punctate left nephrolithiasis. Symmetric renal enhancement and contrast excretion with normal proximal ureters. Unremarkable urinary bladder. Stomach/Bowel: Negative rectum. Mild to moderate inflammatory stranding along the left lateral margin of the sigmoid colon on series 3, image 63 where a 15 millimeter sigmoid diverticula is noted. Mild if any associated regional sigmoid wall thickening. Diverticulosis continues proximally to the right colon with no other active inflammation. Normal retrocecal  appendix. Negative terminal ileum. No dilated small bowel. There is wiring or catheter tubing which tracks into the ventral upper abdomen from the right lower chest and is stable since 2018. Negative intraabdominal stomach. Small duodenal diverticulum on series 3, image 25. No free air. No free fluid. Vascular/Lymphatic: Extensive Aortoiliac calcified atherosclerosis. Major arterial structures are patent. Portal venous system is patent. No lymphadenopathy, including at the porta hepatis. Reproductive: Chronic fat containing inguinal hernias are stable. Otherwise negative. Other: No pelvic free fluid. Musculoskeletal: Stable grade 1 anterolisthesis at L4-L5 with severe lower lumbar facet degeneration. No acute osseous abnormality identified. IMPRESSION: 1. Mild acute inflammation associated with mid sigmoid colon diverticula compatible focal diverticulitis. No complicating features. Widespread underlying large bowel diverticulosis. 2. Abnormal gallbladder which appears highly thickened and irregular, but may not be significantly changed since 2018. Considering the nonspecific ultrasound appearance of the gallbladder at that time, a follow-up Abdomen MRI (liver protocol without and with contrast) might best evaluate further. 3. Small gastric hernia. Nephrolithiasis. Aortic Atherosclerosis (ICD10-I70.0). Electronically Signed   By: Genevie Ann M.D.   On: 04/30/2019 03:09    Pending Labs Unresulted Labs (From admission, onward)    Start     Ordered   04/30/19 0316  SARS Coronavirus 2 (CEPHEID - Performed in [place] hospital lab), Hosp Order  (Asymptomatic Patients Labs)  Once,   STAT    Question:  Rule Out  Answer:  Yes   04/30/19 0316          Vitals/Pain Today's Vitals   04/30/19 0315 04/30/19 0430 04/30/19 0430 04/30/19 0445  BP: (!) 150/84 128/78  (!) 117/54  Pulse: 70 (!) 59  60  Resp:      Temp:      TempSrc:      SpO2: 95% 96%  96%  PainSc:   5      Isolation Precautions No active  isolations  Medications Medications  sodium chloride flush (NS) 0.9 % injection 3 mL (3 mLs Intravenous Given 04/30/19 0331)  ondansetron (ZOFRAN-ODT) disintegrating tablet 4 mg (4 mg Oral Given 04/30/19 0038)  fentaNYL (SUBLIMAZE) injection 100 mcg (100 mcg Intravenous Given 04/30/19 0241)  ondansetron (ZOFRAN) injection 4 mg (4 mg Intravenous Given 04/30/19 0257)  iohexol (OMNIPAQUE) 300 MG/ML solution 100 mL (100 mLs Intravenous Contrast Given 04/30/19 0247)  fentaNYL (SUBLIMAZE) injection 100 mcg (100 mcg Intravenous Given 04/30/19 0330)  cefTRIAXone (ROCEPHIN) 2  g in sodium chloride 0.9 % 100 mL IVPB (0 g Intravenous Stopped 04/30/19 0430)    And  metroNIDAZOLE (FLAGYL) IVPB 500 mg (0 mg Intravenous Stopped 04/30/19 0436)  dicyclomine (BENTYL) injection 20 mg (20 mg Intramuscular Given 04/30/19 8527)    Mobility walks Low fall risk   Focused Assessments    R Recommendations: See Admitting Provider Note  Report given to:   Additional Notes:

## 2019-04-30 NOTE — Discharge Instructions (Signed)
Gallbladder Eating Plan °If you have a gallbladder condition, you may have trouble digesting fats. Eating a low-fat diet can help reduce your symptoms, and may be helpful before and after having surgery to remove your gallbladder (cholecystectomy). Your health care provider may recommend that you work with a diet and nutrition specialist (dietitian) to help you reduce the amount of fat in your diet. °What are tips for following this plan? °General guidelines °· Limit your fat intake to less than 30% of your total daily calories. If you eat around 1,800 calories each day, this is less than 60 grams (g) of fat per day. °· Fat is an important part of a healthy diet. Eating a low-fat diet can make it hard to maintain a healthy body weight. Ask your dietitian how much fat, calories, and other nutrients you need each day. °· Eat small, frequent meals throughout the day instead of three large meals. °· Drink at least 8-10 cups of fluid a day. Drink enough fluid to keep your urine clear or pale yellow. °· Limit alcohol intake to no more than 1 drink a day for nonpregnant women and 2 drinks a day for men. One drink equals 12 oz of beer, 5 oz of wine, or 1½ oz of hard liquor. °Reading food labels °· Check Nutrition Facts on food labels for the amount of fat per serving. Choose foods with less than 3 grams of fat per serving. °Shopping °· Choose nonfat and low-fat healthy foods. Look for the words “nonfat,” “low fat,” or “fat free.” °· Avoid buying processed or prepackaged foods. °Cooking °· Cook using low-fat methods, such as baking, broiling, grilling, or boiling. °· Cook with small amounts of healthy fats, such as olive oil, grapeseed oil, canola oil, or sunflower oil. °What foods are recommended? ° °· All fresh, frozen, or canned fruits and vegetables. °· Whole grains. °· Low-fat or non-fat (skim) milk and yogurt. °· Lean meat, skinless poultry, fish, eggs, and beans. °· Low-fat protein supplement powders or  drinks. °· Spices and herbs. °What foods are not recommended? °· High-fat foods. These include baked goods, fast food, fatty cuts of meat, ice cream, french toast, sweet rolls, pizza, cheese bread, foods covered with butter, creamy sauces, or cheese. °· Fried foods. These include french fries, tempura, battered fish, breaded chicken, fried breads, and sweets. °· Foods with strong odors. °· Foods that cause bloating and gas. °Summary °· A low-fat diet can be helpful if you have a gallbladder condition, or before and after gallbladder surgery. °· Limit your fat intake to less than 30% of your total daily calories. This is about 60 g of fat if you eat 1,800 calories each day. °· Eat small, frequent meals throughout the day instead of three large meals. °This information is not intended to replace advice given to you by your health care provider. Make sure you discuss any questions you have with your health care provider. °Document Released: 10/07/2013 Document Revised: 01/23/2019 Document Reviewed: 11/09/2016 °Elsevier Patient Education © 2020 [employer] ° °

## 2019-04-30 NOTE — ED Triage Notes (Signed)
Pt states that he has not had a BM in 3 days, c/o of n/v, pt thinks that he has a bowel blockage.

## 2019-04-30 NOTE — ED Notes (Signed)
RN attempted Report x1 

## 2019-04-30 NOTE — Progress Notes (Signed)
Patient called out for help bent over bed with pain vomiting demanding something stronger for vomiting.

## 2019-04-30 NOTE — Progress Notes (Signed)
PRN given for pain vomiting and Hight Blood pressure

## 2019-04-30 NOTE — Progress Notes (Signed)
Paged MD on call for nausea and pain med orders.

## 2019-04-30 NOTE — H&P (Addendum)
History and Physical    Luis Henderson VZD:[id number] DOB: 22-Oct-1955 DOA: 04/30/2019  PCP: Marliss Coots, NP  Patient coming from: Home.  Chief Complaint: Abdominal pain nausea vomiting.  HPI: Luis Henderson is a 63 y.o. 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  Multiple episodes of vomiting which has been ongoing and progressively worsening over the last few weeks.  Patient states the pain is mostly in the both lower quadrants.  Unable to keep anything and has not had a bowel movement for last 3 days.  Denies fever chills chest pain headache visual symptoms or shortness of breath or productive cough.  ED Course: In the ER patient had a CT abdomen pelvis and sonogram.  CT scan was showing features concerning for acute sigmoid diverticulitis with features concerning for cholecystitis and sonogram of the abdomen was done which shows features concerning for cholecystitis and probably has a chronic cholecystitis.  Lipase was mildly elevated.  Patient's lab work show hemoglobin of 18.2 which is chronic WBC count 11.1.  Creatinine 1.2.  Bicarb 21.  LFTs were normal.  On exam patient has mild tenderness with no rebound tenderness.  Patient started on empiric antibiotics fluids and admitted for acute diverticulitis with chronic cholecystitis.  Also has inguinal hernia.  Review of Systems: As per HPI, rest all negative.   Past Medical History:  Diagnosis Date   Anxiety    Depression    History of prediabetes    Hyperlipidemia    Hypertension     Past Surgical History:  Procedure Laterality Date   CYSTOSCOPY WITH RETROGRADE PYELOGRAM, URETEROSCOPY AND STENT PLACEMENT Right 01/15/2015   Procedure: CYSTOSCOPY, RIGHT URETEROSCOPY/ RETROGRADE PYELOGRAM,HOLMIUM LASER/ LITHOTRIPSY, STENT PLACEMENT, URETERAL BALLOON DIALATION;  Surgeon: Kathie Rhodes, MD;  Location: [facility];  Service: Urology;  Laterality: Right;   SKIN GRAFT     left hand   VENTRICULOPERITONEAL SHUNT Right 09/04/2016   Procedure: SHUNT INSERTION VENTRICULAR-PERITONEAL;  Surgeon: Newman Pies, MD;  Location: [facility];  Service: Neurosurgery;  Laterality: Right;   WISDOM TOOTH EXTRACTION       reports that he has been smoking cigarettes. He has a 28.00 pack-year smoking history. He has never used smokeless tobacco. He reports current alcohol use of about 5.0 - 6.0 standard drinks of alcohol per week. He reports that he does not use drugs.  Allergies  Allergen Reactions   Codeine Itching   Lisinopril Cough    Family History  Problem Relation Age of Onset   Hyperlipidemia Father     Prior to Admission medications   Medication Sig Start Date End Date Taking? Authorizing Provider  amLODipine (NORVASC) 10 MG tablet Take 1 tablet (10 mg total) by mouth daily. 10/03/17  Yes Charlott Rakes, MD  aspirin EC 81 MG tablet Take 81 mg by mouth daily.   Yes [provider]  albuterol (PROVENTIL HFA;VENTOLIN HFA) 108 (90 Base) MCG/ACT inhaler Inhale 2 puffs into the lungs every 6 (six) hours as needed for wheezing or shortness of breath. Patient not taking: Reported on 02/04/2019 10/03/17   Charlott Rakes, MD  escitalopram (LEXAPRO) 10 MG tablet Take 1 tablet (10 mg total) by mouth daily. Patient not taking: Reported on 02/04/2019 10/03/17   Charlott Rakes, MD  prochlorperazine (COMPAZINE) 10 MG tablet Take 1 tablet (10 mg total) by mouth every 6 (six) hours as needed for nausea or vomiting. Patient not taking:  Reported on 02/04/2019 11/16/17   Wyatt Portela, MD    Physical Exam: Constitutional: Moderately built and nourished. Vitals:   04/30/19 0515 04/30/19 0530 04/30/19 0545 04/30/19 0618  BP: 135/82 (!) 139/91 129/73 (!) 165/83  Pulse: 63 66 65 62  Resp:    18  Temp:    98.2 F (36.8 C)  TempSrc:    Oral  SpO2: 95% 95% 95% 98%   Eyes: Anicteric no pallor. ENMT: No  discharge from the ears eyes nose and mouth. Neck: No mass or.  No neck rigidity. Respiratory: No rhonchi or crepitations. Cardiovascular: S1-S2 heard. Abdomen: Mild tenderness in the lower quadrants. Musculoskeletal: No edema. Skin: No rash. Neurologic: Alert awake oriented to time place and person.  Moves all extremities. Psychiatric: Appears normal.  Normal affect.   Labs on Admission: I have personally reviewed following labs and imaging studies  CBC: Recent Labs  Lab 04/30/19 0044  WBC 11.1*  HGB 18.2*  HCT 55.0*  MCV 88.9  PLT 144*   Basic Metabolic Panel: Recent Labs  Lab 04/30/19 0044  NA 138  K 3.8  CL 106  CO2 21*  GLUCOSE 119*  BUN 13  CREATININE 1.21  CALCIUM 9.3   GFR: CrCl cannot be calculated (Unknown ideal weight.). Liver Function Tests: Recent Labs  Lab 04/30/19 0044  AST 19  ALT 17  ALKPHOS 82  BILITOT 0.9  PROT 6.7  ALBUMIN 4.2   Recent Labs  Lab 04/30/19 0044  LIPASE 108*   No results for input(s): AMMONIA in the last 168 hours. Coagulation Profile: No results for input(s): INR, PROTIME in the last 168 hours. Cardiac Enzymes: No results for input(s): CKTOTAL, CKMB, CKMBINDEX, TROPONINI in the last 168 hours. BNP (last 3 results) No results for input(s): PROBNP in the last 8760 hours. HbA1C: No results for input(s): HGBA1C in the last 72 hours. CBG: No results for input(s): GLUCAP in the last 168 hours. Lipid Profile: No results for input(s): CHOL, HDL, LDLCALC, TRIG, CHOLHDL, LDLDIRECT in the last 72 hours. Thyroid Function Tests: No results for input(s): TSH, T4TOTAL, FREET4, T3FREE, THYROIDAB in the last 72 hours. Anemia Panel: No results for input(s): VITAMINB12, FOLATE, FERRITIN, TIBC, IRON, RETICCTPCT in the last 72 hours. Urine analysis:    Component Value Date/Time   COLORURINE YELLOW 04/30/2019 0044   APPEARANCEUR CLEAR 04/30/2019 0044   LABSPEC 1.011 04/30/2019 0044   PHURINE 5.0 04/30/2019 0044   GLUCOSEU  NEGATIVE 04/30/2019 0044   HGBUR SMALL (A) 04/30/2019 0044   BILIRUBINUR NEGATIVE 04/30/2019 0044   KETONESUR NEGATIVE 04/30/2019 0044   PROTEINUR NEGATIVE 04/30/2019 0044   UROBILINOGEN 0.2 05/07/2017 1311   NITRITE NEGATIVE 04/30/2019 0044   LEUKOCYTESUR NEGATIVE 04/30/2019 0044   Sepsis Labs: @LABRCNTIP (procalcitonin:4,lacticidven:4) ) Recent Results (from the past 240 hour(s))  SARS Coronavirus 2 (CEPHEID - Performed in [place] hospital lab), Hosp Order     Status: None   Collection Time: 04/30/19  4:30 AM   Specimen: Nasopharyngeal Swab  Result Value Ref Range Status   SARS Coronavirus 2 NEGATIVE NEGATIVE Final    Comment: (NOTE) If result is NEGATIVE SARS-CoV-2 target nucleic acids are NOT DETECTED. The SARS-CoV-2 RNA is generally detectable in upper and lower  respiratory specimens during the acute phase of infection. The lowest  concentration of SARS-CoV-2 viral copies this assay can detect is 250  copies / mL. A negative result does not preclude SARS-CoV-2 infection  and should not be used as the sole basis for treatment or  other  patient management decisions.  A negative result may occur with  improper specimen collection / handling, submission of specimen other  than nasopharyngeal swab, presence of viral mutation(s) within the  areas targeted by this assay, and inadequate number of viral copies  (<250 copies / mL). A negative result must be combined with clinical  observations, patient history, and epidemiological information. If result is POSITIVE SARS-CoV-2 target nucleic acids are DETECTED. The SARS-CoV-2 RNA is generally detectable in upper and lower  respiratory specimens dur ing the acute phase of infection.  Positive  results are indicative of active infection with SARS-CoV-2.  Clinical  correlation with patient history and other diagnostic information is  necessary to determine patient infection status.  Positive results do  not rule out bacterial  infection or co-infection with other viruses. If result is PRESUMPTIVE POSTIVE SARS-CoV-2 nucleic acids MAY BE PRESENT.   A presumptive positive result was obtained on the submitted specimen  and confirmed on repeat testing.  While 2019 novel coronavirus  (SARS-CoV-2) nucleic acids may be present in the submitted sample  additional confirmatory testing may be necessary for epidemiological  and / or clinical management purposes  to differentiate between  SARS-CoV-2 and other Sarbecovirus currently known to infect humans.  If clinically indicated additional testing with an alternate test  methodology [phone]) is advised. The SARS-CoV-2 RNA is generally  detectable in upper and lower respiratory sp ecimens during the acute  phase of infection. The expected result is Negative. Fact Sheet for Patients:  StrictlyIdeas.no Fact Sheet for Healthcare Providers: BankingDealers.co.za This test is not yet approved or cleared by the [place] FDA and has been authorized for detection and/or diagnosis of SARS-CoV-2 by FDA under an Emergency Use Authorization (EUA).  This EUA will remain in effect (meaning this test can be used) for the duration of the COVID-19 declaration under Section 564(b)(1) of the Act, 21 U.S.C. section 360bbb-3(b)(1), unless the authorization is terminated or revoked sooner. Performed at [facility], [place] [address], [place], [place] [address]      Radiological Exams on Admission: US Abdomen Complete  Result Date: 04/30/2019 CLINICAL DATA:  Abdominal pain EXAM: ABDOMEN ULTRASOUND COMPLETE COMPARISON:  CT from earlier today FINDINGS: Gallbladder: Thick-walled gallbladder without appreciable color Doppler flow, somewhat irregular appearing. There is an 11 mm polyp. No focal tenderness or calcified stone. Common bile duct: Diameter: 6 mm Liver: No focal lesion identified. Within normal limits in parenchymal echogenicity.  Portal vein is patent on color Doppler imaging with normal direction of blood flow towards the liver. IVC: No abnormality visualized. Pancreas: Not visualized by ultrasound but seen on prior CT. Spleen: Size and appearance within normal limits. Right Kidney: Length: 11.5 cm. Echogenicity within normal limits. No mass or hydronephrosis visualized. Left Kidney: Length: 13.3 cm. Asymmetric enlargement attributed to a 8 cm cyst with thin benign-appearing septation that is nonenhancing by CT and stable. No hydronephrosis. Abdominal aorta: Not visualized sonographically. There is atherosclerosis without aneurysm by CT Other findings: None. IMPRESSION: 1. Irregular thick walled gallbladder with similar appearance in 2018 favoring chronic cholecystitis over mass, but need surgical referral. There is an 11 mm polyp which at a minimum would need follow-up; enhanced abdominal MRI may be contributory. 2. No evidence acute cholecystitis. Electronically Signed   By: Monte Fantasia M.D.   On: 04/30/2019 04:23   Ct Abdomen Pelvis W Contrast  Result Date: 04/30/2019 CLINICAL DATA:  63 year old male with abdominal pain, constipation. EXAM: CT ABDOMEN AND PELVIS WITH  CONTRAST TECHNIQUE: Multidetector CT imaging of the abdomen and pelvis was performed using the standard protocol following bolus administration of intravenous contrast. CONTRAST:  180mL OMNIPAQUE IOHEXOL 300 MG/ML  SOLN COMPARISON:  CT Abdomen and Pelvis 01/13/2015. CTA chest 09/29/2017. FINDINGS: Lower chest: Negative aside from small hiatal hernia which was apparent in 2018. Hepatobiliary: Abnormal gallbladder is indistinct and hyperenhancing with regional small volume fluid or edema. See series 3, image 20. However, the gallbladder is nondistended. Gallbladder might of been similarly abnormal in 2018 (with wall thickening by ultrasound at that time), but appeared normal in 2016. There is no bile duct enlargement. Liver enhancement remains stable, normal. Pancreas:  Negative. Spleen: Negative. Adrenals/Urinary Tract: Large 9 centimeter exophytic left renal cyst with simple fluid density. Right nephrolithiasis, 7 millimeters. Punctate left nephrolithiasis. Symmetric renal enhancement and contrast excretion with normal proximal ureters. Unremarkable urinary bladder. Stomach/Bowel: Negative rectum. Mild to moderate inflammatory stranding along the left lateral margin of the sigmoid colon on series 3, image 63 where a 15 millimeter sigmoid diverticula is noted. Mild if any associated regional sigmoid wall thickening. Diverticulosis continues proximally to the right colon with no other active inflammation. Normal retrocecal appendix. Negative terminal ileum. No dilated small bowel. There is wiring or catheter tubing which tracks into the ventral upper abdomen from the right lower chest and is stable since 2018. Negative intraabdominal stomach. Small duodenal diverticulum on series 3, image 25. No free air. No free fluid. Vascular/Lymphatic: Extensive Aortoiliac calcified atherosclerosis. Major arterial structures are patent. Portal venous system is patent. No lymphadenopathy, including at the porta hepatis. Reproductive: Chronic fat containing inguinal hernias are stable. Otherwise negative. Other: No pelvic free fluid. Musculoskeletal: Stable grade 1 anterolisthesis at L4-L5 with severe lower lumbar facet degeneration. No acute osseous abnormality identified. IMPRESSION: 1. Mild acute inflammation associated with mid sigmoid colon diverticula compatible focal diverticulitis. No complicating features. Widespread underlying large bowel diverticulosis. 2. Abnormal gallbladder which appears highly thickened and irregular, but may not be significantly changed since 2018. Considering the nonspecific ultrasound appearance of the gallbladder at that time, a follow-up Abdomen MRI (liver protocol without and with contrast) might best evaluate further. 3. Small gastric hernia.  Nephrolithiasis. Aortic Atherosclerosis (ICD10-I70.0). Electronically Signed   By: Genevie Ann M.D.   On: 04/30/2019 03:09     Assessment/Plan Active Problems:   Diverticulitis    1. Acute sigmoid diverticulitis with nausea vomiting -we will keep patient IV antibiotics n.p.o. IV fluids pain relief medications.  Monitor vomiting improves may start p.o.'s. 2. Which is concerning for chronic cholecystitis -consult surgery will be seeing patient for further recommendations.  Presently on antibiotics IV fluids. 3. History of hypertension we will keep patient on PRN IV hydralazine since patient is n.p.o. 4. Elevated lipase -patient states he only drinks alcohol rarely.  Per chart he has had EtOH abuse previously.  Closely observe.  CT scan does not show any features concerning for pancreatitis. 5. Tobacco abuse advised about quitting. 6. 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  On aspirin. 7. 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  Denies any headache or visual symptoms. 8. Inguinal hernia - appears non obstructed.   DVT prophylaxis: SCDs in anticipation of procedure. Code Status: Full code. Family Communication: Discussed with patient. Disposition Plan: Home. Consults called: General surgery. Admission status: Observation   Rise Patience MD [facility] Pager [phone].  If 7PM-7AM, please contact night-coverage [email or web address] Password [email or web address]  04/30/2019, 7:02 AM

## 2019-04-30 NOTE — ED Provider Notes (Signed)
[facility] DEPARTMENT Provider Note   CSN: [id number] Arrival date & time: 04/30/19  0026     History   Chief Complaint Chief Complaint  Patient presents with   Abdominal Pain    HPI CHASTEN BLAZE is a 63 y.o. male.     The history is provided by the patient.  Abdominal Pain Pain location:  Generalized Pain quality: aching   Pain radiates to:  Does not radiate Pain severity:  Severe Onset quality:  Gradual Timing:  Constant Progression:  Unchanged Chronicity:  New Context: not medication withdrawal and not previous surgeries   Relieved by:  Nothing Worsened by:  Nothing Ineffective treatments:  None tried Associated symptoms: constipation, nausea and vomiting   Associated symptoms: no chest pain, no cough, no fever and no shortness of breath   Risk factors: no aspirin use and not elderly   Patient states he believe he has a bowel obstruction as he cannot stop vomiting and has not made stool for several days.    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   Abnormal gait 09/03/2016   Acute encephalopathy 09/03/2016   Low back pain 06/09/2016   HTN (hypertension) 04/01/2015    Past Surgical History:  Procedure Laterality Date   CYSTOSCOPY WITH RETROGRADE PYELOGRAM, URETEROSCOPY AND STENT PLACEMENT Right 01/15/2015   Procedure: CYSTOSCOPY, RIGHT URETEROSCOPY/ RETROGRADE  PYELOGRAM,HOLMIUM LASER/ LITHOTRIPSY, STENT PLACEMENT, URETERAL BALLOON DIALATION;  Surgeon: Kathie Rhodes, MD;  Location: [facility];  Service: Urology;  Laterality: Right;   SKIN GRAFT     left hand   VENTRICULOPERITONEAL SHUNT Right 09/04/2016   Procedure: SHUNT INSERTION VENTRICULAR-PERITONEAL;  Surgeon: Newman Pies, MD;  Location: [facility];  Service: Neurosurgery;  Laterality: Right;   WISDOM TOOTH EXTRACTION          Home Medications    Prior to Admission medications   Medication Sig Start Date End Date Taking? Authorizing Provider  amLODipine (NORVASC) 10 MG tablet Take 1 tablet (10 mg total) by mouth daily. 10/03/17  Yes Charlott Rakes, MD  aspirin EC 81 MG tablet Take 81 mg by mouth daily.   Yes [provider]  albuterol (PROVENTIL HFA;VENTOLIN HFA) 108 (90 Base) MCG/ACT inhaler Inhale 2 puffs into the lungs every 6 (six) hours as needed for wheezing or shortness of breath. Patient not taking: Reported on 02/04/2019 10/03/17   Charlott Rakes, MD  escitalopram (LEXAPRO) 10 MG tablet Take 1 tablet (10 mg total) by mouth daily. Patient not taking: Reported on 02/04/2019 10/03/17   Charlott Rakes, MD  prochlorperazine (COMPAZINE) 10 MG tablet Take 1 tablet (10 mg total) by mouth every 6 (six) hours as needed for nausea or vomiting. Patient not taking: Reported on 02/04/2019 11/16/17   Wyatt Portela, MD    Family History Family History  Problem Relation Age of Onset   Hyperlipidemia Father  Social History Social History   Tobacco Use   Smoking status: Current Every Day Smoker    Packs/day: 1.00    Years: 28.00    Pack years: 28.00    Types: Cigarettes   Smokeless tobacco: Never Used  Substance Use Topics   Alcohol use: Yes    Alcohol/week: 5.0 - 6.0 standard drinks    Types: 5 - 6 Cans of beer per week    Comment: daily alcohol/drinks beer, wine, and liquor   Drug use: No     Allergies   Codeine and Lisinopril   Review of Systems Review of  Systems  Constitutional: Negative for fever.  Respiratory: Negative for cough and shortness of breath.   Cardiovascular: Negative for chest pain.  Gastrointestinal: Positive for abdominal pain, constipation, nausea and vomiting.  All other systems reviewed and are negative.    Physical Exam Updated Vital Signs BP (!) 150/84    Pulse 70    Temp 98.8 F (37.1 C) (Oral)    Resp 20    SpO2 95%   Physical Exam Vitals signs and nursing note reviewed.  Constitutional:      General: He is not in acute distress.    Appearance: Normal appearance. He is obese.  HENT:     Head: Normocephalic and atraumatic.     Mouth/Throat:     Mouth: Mucous membranes are moist.  Eyes:     Extraocular Movements: Extraocular movements intact.     Pupils: Pupils are equal, round, and reactive to light.  Neck:     Musculoskeletal: Normal range of motion and neck supple.  Cardiovascular:     Rate and Rhythm: Normal rate and regular rhythm.     Pulses: Normal pulses.     Heart sounds: Normal heart sounds.  Pulmonary:     Effort: Pulmonary effort is normal.     Breath sounds: Normal breath sounds. No wheezing or rales.  Abdominal:     General: Abdomen is protuberant. Bowel sounds are increased.     Tenderness: There is abdominal tenderness.     Hernia: No hernia is present.  Musculoskeletal: Normal range of motion.  Skin:    General: Skin is warm and dry.     Capillary Refill: Capillary refill takes less than 2 seconds.  Neurological:     General: No focal deficit present.     Mental Status: He is alert and oriented to person, place, and time.  Psychiatric:        Mood and Affect: Mood normal.        Behavior: Behavior normal.      ED Treatments / Results  Labs (all labs ordered are listed, but only abnormal results are displayed) Results for orders placed or performed during the hospital encounter of 04/30/19  Lipase, blood  Result Value Ref Range   Lipase 108 (H) 11 - 51 U/L    Comprehensive metabolic panel  Result Value Ref Range   Sodium 138 135 - 145 mmol/L   Potassium 3.8 3.5 - 5.1 mmol/L   Chloride 106 98 - 111 mmol/L   CO2 21 (L) 22 - 32 mmol/L   Glucose, Bld 119 (H) 70 - 99 mg/dL   BUN 13 8 - 23 mg/dL   Creatinine, Ser 1.21 0.61 - 1.24 mg/dL   Calcium 9.3 8.9 - 10.3 mg/dL   Total Protein 6.7 6.5 - 8.1 g/dL   Albumin 4.2 3.5 - 5.0 g/dL   AST 19 15 - 41 U/L  ALT 17 0 - 44 U/L   Alkaline Phosphatase 82 38 - 126 U/L   Total Bilirubin 0.9 0.3 - 1.2 mg/dL   GFR calc non Af Amer >60 >60 mL/min   GFR calc Af Amer >60 >60 mL/min   Anion gap 11 5 - 15  CBC  Result Value Ref Range   WBC 11.1 (H) 4.0 - 10.5 K/uL   RBC 6.19 (H) 4.22 - 5.81 MIL/uL   Hemoglobin 18.2 (H) 13.0 - 17.0 g/dL   HCT 55.0 (H) 39.0 - 52.0 %   MCV 88.9 80.0 - 100.0 fL   MCH 29.4 26.0 - 34.0 pg   MCHC 33.1 30.0 - 36.0 g/dL   RDW 14.0 11.5 - 15.5 %   Platelets 427 (H) 150 - 400 K/uL   nRBC 0.0 0.0 - 0.2 %  Urinalysis, Routine w reflex microscopic  Result Value Ref Range   Color, Urine YELLOW YELLOW   APPearance CLEAR CLEAR   Specific Gravity, Urine 1.011 1.005 - 1.030   pH 5.0 5.0 - 8.0   Glucose, UA NEGATIVE NEGATIVE mg/dL   Hgb urine dipstick SMALL (A) NEGATIVE   Bilirubin Urine NEGATIVE NEGATIVE   Ketones, ur NEGATIVE NEGATIVE mg/dL   Protein, ur NEGATIVE NEGATIVE mg/dL   Nitrite NEGATIVE NEGATIVE   Leukocytes,Ua NEGATIVE NEGATIVE   WBC, UA 0-5 0 - 5 WBC/hpf   Bacteria, UA NONE SEEN NONE SEEN   Mucus PRESENT    Ct Abdomen Pelvis W Contrast  Result Date: 04/30/2019 CLINICAL DATA:  63 year old male with abdominal pain, constipation. EXAM: CT ABDOMEN AND PELVIS WITH CONTRAST TECHNIQUE: Multidetector CT imaging of the abdomen and pelvis was performed using the standard protocol following bolus administration of intravenous contrast. CONTRAST:  199mL OMNIPAQUE IOHEXOL 300 MG/ML  SOLN COMPARISON:  CT Abdomen and Pelvis 01/13/2015. CTA chest 09/29/2017. FINDINGS: Lower chest:  Negative aside from small hiatal hernia which was apparent in 2018. Hepatobiliary: Abnormal gallbladder is indistinct and hyperenhancing with regional small volume fluid or edema. See series 3, image 20. However, the gallbladder is nondistended. Gallbladder might of been similarly abnormal in 2018 (with wall thickening by ultrasound at that time), but appeared normal in 2016. There is no bile duct enlargement. Liver enhancement remains stable, normal. Pancreas: Negative. Spleen: Negative. Adrenals/Urinary Tract: Large 9 centimeter exophytic left renal cyst with simple fluid density. Right nephrolithiasis, 7 millimeters. Punctate left nephrolithiasis. Symmetric renal enhancement and contrast excretion with normal proximal ureters. Unremarkable urinary bladder. Stomach/Bowel: Negative rectum. Mild to moderate inflammatory stranding along the left lateral margin of the sigmoid colon on series 3, image 63 where a 15 millimeter sigmoid diverticula is noted. Mild if any associated regional sigmoid wall thickening. Diverticulosis continues proximally to the right colon with no other active inflammation. Normal retrocecal appendix. Negative terminal ileum. No dilated small bowel. There is wiring or catheter tubing which tracks into the ventral upper abdomen from the right lower chest and is stable since 2018. Negative intraabdominal stomach. Small duodenal diverticulum on series 3, image 25. No free air. No free fluid. Vascular/Lymphatic: Extensive Aortoiliac calcified atherosclerosis. Major arterial structures are patent. Portal venous system is patent. No lymphadenopathy, including at the porta hepatis. Reproductive: Chronic fat containing inguinal hernias are stable. Otherwise negative. Other: No pelvic free fluid. Musculoskeletal: Stable grade 1 anterolisthesis at L4-L5 with severe lower lumbar facet degeneration. No acute osseous abnormality identified. IMPRESSION: 1. Mild acute inflammation associated with mid sigmoid  colon diverticula compatible focal diverticulitis. No complicating features. Widespread underlying large bowel  diverticulosis. 2. Abnormal gallbladder which appears highly thickened and irregular, but may not be significantly changed since 2018. Considering the nonspecific ultrasound appearance of the gallbladder at that time, a follow-up Abdomen MRI (liver protocol without and with contrast) might best evaluate further. 3. Small gastric hernia. Nephrolithiasis. Aortic Atherosclerosis (ICD10-I70.0). Electronically Signed   By: Genevie Ann M.D.   On: 04/30/2019 03:09    Radiology Ct Abdomen Pelvis W Contrast  Result Date: 04/30/2019 CLINICAL DATA:  63 year old male with abdominal pain, constipation. EXAM: CT ABDOMEN AND PELVIS WITH CONTRAST TECHNIQUE: Multidetector CT imaging of the abdomen and pelvis was performed using the standard protocol following bolus administration of intravenous contrast. CONTRAST:  120mL OMNIPAQUE IOHEXOL 300 MG/ML  SOLN COMPARISON:  CT Abdomen and Pelvis 01/13/2015. CTA chest 09/29/2017. FINDINGS: Lower chest: Negative aside from small hiatal hernia which was apparent in 2018. Hepatobiliary: Abnormal gallbladder is indistinct and hyperenhancing with regional small volume fluid or edema. See series 3, image 20. However, the gallbladder is nondistended. Gallbladder might of been similarly abnormal in 2018 (with wall thickening by ultrasound at that time), but appeared normal in 2016. There is no bile duct enlargement. Liver enhancement remains stable, normal. Pancreas: Negative. Spleen: Negative. Adrenals/Urinary Tract: Large 9 centimeter exophytic left renal cyst with simple fluid density. Right nephrolithiasis, 7 millimeters. Punctate left nephrolithiasis. Symmetric renal enhancement and contrast excretion with normal proximal ureters. Unremarkable urinary bladder. Stomach/Bowel: Negative rectum. Mild to moderate inflammatory stranding along the left lateral margin of the sigmoid colon  on series 3, image 63 where a 15 millimeter sigmoid diverticula is noted. Mild if any associated regional sigmoid wall thickening. Diverticulosis continues proximally to the right colon with no other active inflammation. Normal retrocecal appendix. Negative terminal ileum. No dilated small bowel. There is wiring or catheter tubing which tracks into the ventral upper abdomen from the right lower chest and is stable since 2018. Negative intraabdominal stomach. Small duodenal diverticulum on series 3, image 25. No free air. No free fluid. Vascular/Lymphatic: Extensive Aortoiliac calcified atherosclerosis. Major arterial structures are patent. Portal venous system is patent. No lymphadenopathy, including at the porta hepatis. Reproductive: Chronic fat containing inguinal hernias are stable. Otherwise negative. Other: No pelvic free fluid. Musculoskeletal: Stable grade 1 anterolisthesis at L4-L5 with severe lower lumbar facet degeneration. No acute osseous abnormality identified. IMPRESSION: 1. Mild acute inflammation associated with mid sigmoid colon diverticula compatible focal diverticulitis. No complicating features. Widespread underlying large bowel diverticulosis. 2. Abnormal gallbladder which appears highly thickened and irregular, but may not be significantly changed since 2018. Considering the nonspecific ultrasound appearance of the gallbladder at that time, a follow-up Abdomen MRI (liver protocol without and with contrast) might best evaluate further. 3. Small gastric hernia. Nephrolithiasis. Aortic Atherosclerosis (ICD10-I70.0). Electronically Signed   By: Genevie Ann M.D.   On: 04/30/2019 03:09    Procedures Procedures (including critical care time)  Medications Ordered in ED Medications  dicyclomine (BENTYL) injection 20 mg (has no administration in time range)  sodium chloride flush (NS) 0.9 % injection 3 mL (3 mLs Intravenous Given 04/30/19 0331)  ondansetron (ZOFRAN-ODT) disintegrating tablet 4 mg  (4 mg Oral Given 04/30/19 0038)  fentaNYL (SUBLIMAZE) injection 100 mcg (100 mcg Intravenous Given 04/30/19 0241)  ondansetron (ZOFRAN) injection 4 mg (4 mg Intravenous Given 04/30/19 0257)  iohexol (OMNIPAQUE) 300 MG/ML solution 100 mL (100 mLs Intravenous Contrast Given 04/30/19 0247)  fentaNYL (SUBLIMAZE) injection 100 mcg (100 mcg Intravenous Given 04/30/19 0330)  cefTRIAXone (ROCEPHIN) 2 g in sodium chloride 0.9 %  100 mL IVPB (0 g Intravenous Stopped 04/30/19 0430)    And  metroNIDAZOLE (FLAGYL) IVPB 500 mg (0 mg Intravenous Stopped 04/30/19 0436)      Case d/w Dr. Gershon Crane, nothing to do for gall bladder at this time.  Follow up as an outpatient.   Final Clinical Impressions(s) / ED Diagnoses   Admit to medicine for pancreatitis, and diverticulitis.     Lido Maske, MD 04/30/19 [id number]

## 2019-05-01 DIAGNOSIS — K5792 Diverticulitis of intestine, part unspecified, without perforation or abscess without bleeding: Secondary | ICD-10-CM | POA: Diagnosis not present

## 2019-05-01 DIAGNOSIS — Z982 Presence of cerebrospinal fluid drainage device: Secondary | ICD-10-CM

## 2019-05-01 DIAGNOSIS — I10 Essential (primary) hypertension: Secondary | ICD-10-CM | POA: Diagnosis not present

## 2019-05-01 LAB — COMPREHENSIVE METABOLIC PANEL
ALT: 17 U/L (ref 0–44)
AST: 15 U/L (ref 15–41)
Albumin: 3.9 g/dL (ref 3.5–5.0)
Alkaline Phosphatase: 80 U/L (ref 38–126)
Anion gap: 14 (ref 5–15)
BUN: 14 mg/dL (ref 8–23)
CO2: 20 mmol/L — ABNORMAL LOW (ref 22–32)
Calcium: 8.9 mg/dL (ref 8.9–10.3)
Chloride: 107 mmol/L (ref 98–111)
Creatinine, Ser: 1.13 mg/dL (ref 0.61–1.24)
GFR calc Af Amer: >60 mL/min (ref >60)
GFR calc non Af Amer: >60 mL/min (ref >60)
Glucose, Bld: 100 mg/dL — ABNORMAL HIGH (ref 70–99)
Potassium: 3.3 mmol/L — ABNORMAL LOW (ref 3.5–5.1)
Sodium: 141 mmol/L (ref 135–145)
Total Bilirubin: 1.2 mg/dL (ref 0.3–1.2)
Total Protein: 6.3 g/dL — ABNORMAL LOW (ref 6.5–8.1)

## 2019-05-01 LAB — CBC WITH DIFFERENTIAL/PLATELET
Abs Immature Granulocytes: 0.07 10*3/uL (ref 0.00–0.07)
Basophils Absolute: 0.1 10*3/uL (ref 0.0–0.1)
Basophils Relative: 0 %
Eosinophils Absolute: 0.1 10*3/uL (ref 0.0–0.5)
Eosinophils Relative: 1 %
HCT: 55.8 % — ABNORMAL HIGH (ref 39.0–52.0)
Hemoglobin: 18.4 g/dL — ABNORMAL HIGH (ref 13.0–17.0)
Immature Granulocytes: 1 %
Lymphocytes Relative: 19 %
Lymphs Abs: 2.5 10*3/uL (ref 0.7–4.0)
MCH: 29.2 pg (ref 26.0–34.0)
MCHC: 33 g/dL (ref 30.0–36.0)
MCV: 88.6 fL (ref 80.0–100.0)
Monocytes Absolute: 1.3 10*3/uL — ABNORMAL HIGH (ref 0.1–1.0)
Monocytes Relative: 10 %
Neutro Abs: 9.1 10*3/uL — ABNORMAL HIGH (ref 1.7–7.7)
Neutrophils Relative %: 69 %
Platelets: 335 10*3/uL (ref 150–400)
RBC: 6.3 MIL/uL — ABNORMAL HIGH (ref 4.22–5.81)
RDW: 14.1 % (ref 11.5–15.5)
WBC: 13 10*3/uL — ABNORMAL HIGH (ref 4.0–10.5)
nRBC: 0 % (ref 0.0–0.2)

## 2019-05-01 LAB — HIV ANTIBODY (ROUTINE TESTING W REFLEX): HIV Screen 4th Generation wRfx: NONREACTIVE

## 2019-05-01 MED ORDER — METRONIDAZOLE 500 MG PO TABS: 500.0000 mg | ORAL_TABLET | Freq: Three times a day (TID) | ORAL | 0 refills | Status: AC

## 2019-05-01 MED ORDER — POTASSIUM CHLORIDE CRYS ER 20 MEQ PO TBCR
40.0000 meq | EXTENDED_RELEASE_TABLET | Freq: Once | ORAL | Status: AC
  Administered 2019-05-01: 40 meq via ORAL
  Filled 2019-05-01: qty 2

## 2019-05-01 MED ORDER — PROCHLORPERAZINE MALEATE 10 MG PO TABS: 10.0000 mg | ORAL_TABLET | Freq: Four times a day (QID) | ORAL | 1 refills | Status: DC | PRN

## 2019-05-01 MED ORDER — CIPROFLOXACIN HCL 500 MG PO TABS: 500.0000 mg | ORAL_TABLET | Freq: Two times a day (BID) | ORAL | 0 refills | Status: AC

## 2019-05-01 NOTE — Progress Notes (Signed)
Patient diet order changed to soft diet called for a tray if tolerated meal patient will be discharged today.

## 2019-05-01 NOTE — Progress Notes (Signed)
Patient stated that he did not had any abdominal pain, nausea or vomiting when eating his dinner. Patient ate 25% of his meal.

## 2019-05-01 NOTE — Discharge Summary (Signed)
Physician Discharge Summary  Luis Henderson:[id number] DOB: [birth year] DOA: 04/30/2019  PCP: Marliss Coots, NP  Admit date: 04/30/2019 Discharge date: 05/01/2019  Admitted From: Home Disposition: Home  Recommendations for Outpatient Follow-up:  1. Follow up with PCP in 1-2 weeks 2. Please obtain BMP/CBC in one week 3. Follow-up with general surgery in 2 weeks for further evaluation of chronic cholecystitis and an inguinal hernia  Discharge Condition: Stable CODE STATUS: Full code Diet recommendation: Heart healthy  Brief/Interim Summary: 63 year old male admitted to the hospital abdominal pain, nausea and vomiting.  Found to have acute uncomplicated sigmoid diverticulitis.  Patient was treated with intravenous antibiotics and quickly improved.  His diet was advanced from n.p.o. to solid food and he tolerated this well.  He has not required further pain medication in almost 24 hours.  On his imaging, he was noted to have possible chronic cholecystitis.  He is also noted to have bilateral inguinal hernias.  He was seen by general surgery who will plan on outpatient follow-up for both of these issues once his diverticulitis have resolved.  Patient is feeling significantly improved and wants to discharge home.  He will be discharged home on oral antibiotics.  Discharge Diagnoses:  Principal Problem:   Diverticulitis Active Problems:   HTN (hypertension)   S/P VP shunt   Polycythemia   Acute diverticulitis    Discharge Instructions  Discharge Instructions    Diet - low sodium heart healthy   Complete by: As directed    Increase activity slowly   Complete by: As directed      Allergies as of 05/01/2019      Reactions   Codeine Itching   Lisinopril Cough      Medication List    STOP taking these medications   albuterol 108 (90 Base) MCG/ACT inhaler Commonly known as: VENTOLIN HFA   escitalopram 10 MG tablet Commonly known as: Lexapro     TAKE these medications    amLODipine 10 MG tablet Commonly known as: NORVASC Take 1 tablet (10 mg total) by mouth daily. Notes to patient: 7/17 am   aspirin EC 81 MG tablet Take 81 mg by mouth daily. Notes to patient: 7/17 am   ciprofloxacin 500 MG tablet Commonly known as: Cipro Take 1 tablet (500 mg total) by mouth 2 (two) times daily for 10 days. Notes to patient: 7/16 pm   metroNIDAZOLE 500 MG tablet Commonly known as: Flagyl Take 1 tablet (500 mg total) by mouth 3 (three) times daily for 10 days. Notes to patient: 7/17 pm   prochlorperazine 10 MG tablet Commonly known as: COMPAZINE Take 1 tablet (10 mg total) by mouth every 6 (six) hours as needed for nausea or vomiting.      Follow-up Information    Sur[facility], [place]. Schedule an appointment as soon as possible for a visit in 4 week(s).   Specialty: General Surgery Why: Call to schedule an appointment to follow up for your gallbladder and your hernias once diverticulitis has resolved Contact information: [address] [place] [address] [phone]          Allergies  Allergen Reactions  . Codeine Itching  . Lisinopril Cough    Consultations:  General surgery   Procedures/Studies: US Abdomen Complete  Result Date: 04/30/2019 CLINICAL DATA:  Abdominal pain EXAM: ABDOMEN ULTRASOUND COMPLETE COMPARISON:  CT from earlier today FINDINGS: Gallbladder: Thick-walled gallbladder without appreciable color Doppler flow, somewhat irregular appearing. There is an 11 mm polyp. No  focal tenderness or calcified stone. Common bile duct: Diameter: 6 mm Liver: No focal lesion identified. Within normal limits in parenchymal echogenicity. Portal vein is patent on color Doppler imaging with normal direction of blood flow towards the liver. IVC: No abnormality visualized. Pancreas: Not visualized by ultrasound but seen on prior CT. Spleen: Size and appearance within normal limits. Right Kidney: Length: 11.5 cm. Echogenicity within  normal limits. No mass or hydronephrosis visualized. Left Kidney: Length: 13.3 cm. Asymmetric enlargement attributed to a 8 cm cyst with thin benign-appearing septation that is nonenhancing by CT and stable. No hydronephrosis. Abdominal aorta: Not visualized sonographically. There is atherosclerosis without aneurysm by CT Other findings: None. IMPRESSION: 1. Irregular thick walled gallbladder with similar appearance in 2018 favoring chronic cholecystitis over mass, but need surgical referral. There is an 11 mm polyp which at a minimum would need follow-up; enhanced abdominal MRI may be contributory. 2. No evidence acute cholecystitis. Electronically Signed   By: Monte Fantasia M.D.   On: 04/30/2019 04:23   Ct Abdomen Pelvis W Contrast  Result Date: 04/30/2019 CLINICAL DATA:  63 year old male with abdominal pain, constipation. EXAM: CT ABDOMEN AND PELVIS WITH CONTRAST TECHNIQUE: Multidetector CT imaging of the abdomen and pelvis was performed using the standard protocol following bolus administration of intravenous contrast. CONTRAST:  157mL OMNIPAQUE IOHEXOL 300 MG/ML  SOLN COMPARISON:  CT Abdomen and Pelvis 01/13/2015. CTA chest 09/29/2017. FINDINGS: Lower chest: Negative aside from small hiatal hernia which was apparent in 2018. Hepatobiliary: Abnormal gallbladder is indistinct and hyperenhancing with regional small volume fluid or edema. See series 3, image 20. However, the gallbladder is nondistended. Gallbladder might of been similarly abnormal in 2018 (with wall thickening by ultrasound at that time), but appeared normal in 2016. There is no bile duct enlargement. Liver enhancement remains stable, normal. Pancreas: Negative. Spleen: Negative. Adrenals/Urinary Tract: Large 9 centimeter exophytic left renal cyst with simple fluid density. Right nephrolithiasis, 7 millimeters. Punctate left nephrolithiasis. Symmetric renal enhancement and contrast excretion with normal proximal ureters. Unremarkable urinary  bladder. Stomach/Bowel: Negative rectum. Mild to moderate inflammatory stranding along the left lateral margin of the sigmoid colon on series 3, image 63 where a 15 millimeter sigmoid diverticula is noted. Mild if any associated regional sigmoid wall thickening. Diverticulosis continues proximally to the right colon with no other active inflammation. Normal retrocecal appendix. Negative terminal ileum. No dilated small bowel. There is wiring or catheter tubing which tracks into the ventral upper abdomen from the right lower chest and is stable since 2018. Negative intraabdominal stomach. Small duodenal diverticulum on series 3, image 25. No free air. No free fluid. Vascular/Lymphatic: Extensive Aortoiliac calcified atherosclerosis. Major arterial structures are patent. Portal venous system is patent. No lymphadenopathy, including at the porta hepatis. Reproductive: Chronic fat containing inguinal hernias are stable. Otherwise negative. Other: No pelvic free fluid. Musculoskeletal: Stable grade 1 anterolisthesis at L4-L5 with severe lower lumbar facet degeneration. No acute osseous abnormality identified. IMPRESSION: 1. Mild acute inflammation associated with mid sigmoid colon diverticula compatible focal diverticulitis. No complicating features. Widespread underlying large bowel diverticulosis. 2. Abnormal gallbladder which appears highly thickened and irregular, but may not be significantly changed since 2018. Considering the nonspecific ultrasound appearance of the gallbladder at that time, a follow-up Abdomen MRI (liver protocol without and with contrast) might best evaluate further. 3. Small gastric hernia. Nephrolithiasis. Aortic Atherosclerosis (ICD10-I70.0). Electronically Signed   By: Genevie Ann M.D.   On: 04/30/2019 03:09       Subjective: Feels better today.  No further nausea or vomiting.  Has not required pain medication since yesterday.  Discharge Exam: Vitals:   05/01/19 0437 05/01/19 0926  05/01/19 1205 05/01/19 1552  BP: (!) 155/78 (!) 170/83 (!) 150/73 (!) 146/77  Pulse: 73 63 71 64  Resp: 18 18 18 18   Temp: 98.8 F (37.1 C) 98.5 F (36.9 C) 98.7 F (37.1 C) 99.8 F (37.7 C)  TempSrc: Oral Oral Oral Oral  SpO2: 97% 98% 98% 97%  Weight: 89 kg       General: Pt is alert, awake, not in acute distress Cardiovascular: RRR, S1/S2 +, no rubs, no gallops Respiratory: CTA bilaterally, no wheezing, no rhonchi Abdominal: Soft, NT, ND, bowel sounds + Extremities: no edema, no cyanosis    The results of significant diagnostics from this hospitalization (including imaging, microbiology, ancillary and laboratory) are listed below for reference.     Microbiology: Recent Results (from the past 240 hour(s))  SARS Coronavirus 2 (CEPHEID - Performed in [facility] hospital lab), Hosp Order     Status: None   Collection Time: 04/30/19  4:30 AM   Specimen: Nasopharyngeal Swab  Result Value Ref Range Status   SARS Coronavirus 2 NEGATIVE NEGATIVE Final    Comment: (NOTE) If result is NEGATIVE SARS-CoV-2 target nucleic acids are NOT DETECTED. The SARS-CoV-2 RNA is generally detectable in upper and lower  respiratory specimens during the acute phase of infection. The lowest  concentration of SARS-CoV-2 viral copies this assay can detect is 250  copies / mL. A negative result does not preclude SARS-CoV-2 infection  and should not be used as the sole basis for treatment or other  patient management decisions.  A negative result may occur with  improper specimen collection / handling, submission of specimen other  than nasopharyngeal swab, presence of viral mutation(s) within the  areas targeted by this assay, and inadequate number of viral copies  (<250 copies / mL). A negative result must be combined with clinical  observations, patient history, and epidemiological information. If result is POSITIVE SARS-CoV-2 target nucleic acids are DETECTED. The SARS-CoV-2 RNA is generally  detectable in upper and lower  respiratory specimens dur ing the acute phase of infection.  Positive  results are indicative of active infection with SARS-CoV-2.  Clinical  correlation with patient history and other diagnostic information is  necessary to determine patient infection status.  Positive results do  not rule out bacterial infection or co-infection with other viruses. If result is PRESUMPTIVE POSTIVE SARS-CoV-2 nucleic acids MAY BE PRESENT.   A presumptive positive result was obtained on the submitted specimen  and confirmed on repeat testing.  While 2019 novel coronavirus  (SARS-CoV-2) nucleic acids may be present in the submitted sample  additional confirmatory testing may be necessary for epidemiological  and / or clinical management purposes  to differentiate between  SARS-CoV-2 and other Sarbecovirus currently known to infect humans.  If clinically indicated additional testing with an alternate test  methodology [id number]) is advised. The SARS-CoV-2 RNA is generally  detectable in upper and lower respiratory sp ecimens during the acute  phase of infection. The expected result is Negative. Fact Sheet for Patients:  StrictlyIdeas.no Fact Sheet for Healthcare Providers: BankingDealers.co.za This test is not yet approved or cleared by the [place] FDA and has been authorized for detection and/or diagnosis of SARS-CoV-2 by FDA under an Emergency Use Authorization (EUA).  This EUA will remain in effect (meaning this test can be used) for the duration of the COVID-19 declaration  under Section 564(b)(1) of the Act, 21 U.S.C. section 360bbb-3(b)(1), unless the authorization is terminated or revoked sooner. Performed at [facility], [place] [address] [place], [place] [address]      Labs: BNP (last 3 results) No results for input(s): BNP in the last 8760 hours. Basic Metabolic Panel: Recent Labs  Lab 04/30/19 0044  05/01/19 0442  NA 138 141  K 3.8 3.3*  CL 106 107  CO2 21* 20*  GLUCOSE 119* 100*  BUN 13 14  CREATININE 1.21 1.13  CALCIUM 9.3 8.9   Liver Function Tests: Recent Labs  Lab 04/30/19 0044 05/01/19 0442  AST 19 15  ALT 17 17  ALKPHOS 82 80  BILITOT 0.9 1.2  PROT 6.7 6.3*  ALBUMIN 4.2 3.9   Recent Labs  Lab 04/30/19 0044  LIPASE 108*   No results for input(s): AMMONIA in the last 168 hours. CBC: Recent Labs  Lab 04/30/19 0044 05/01/19 0442  WBC 11.1* 13.0*  NEUTROABS  --  9.1*  HGB 18.2* 18.4*  HCT 55.0* 55.8*  MCV 88.9 88.6  PLT 427* 335   Cardiac Enzymes: No results for input(s): CKTOTAL, CKMB, CKMBINDEX, TROPONINI in the last 168 hours. BNP: Invalid input(s): POCBNP CBG: No results for input(s): GLUCAP in the last 168 hours. D-Dimer No results for input(s): DDIMER in the last 72 hours. Hgb A1c No results for input(s): HGBA1C in the last 72 hours. Lipid Profile No results for input(s): CHOL, HDL, LDLCALC, TRIG, CHOLHDL, LDLDIRECT in the last 72 hours. Thyroid function studies No results for input(s): TSH, T4TOTAL, T3FREE, THYROIDAB in the last 72 hours.  Invalid input(s): FREET3 Anemia work up No results for input(s): VITAMINB12, FOLATE, FERRITIN, TIBC, IRON, RETICCTPCT in the last 72 hours. Urinalysis    Component Value Date/Time   COLORURINE YELLOW 04/30/2019 0044   APPEARANCEUR CLEAR 04/30/2019 0044   LABSPEC 1.011 04/30/2019 0044   PHURINE 5.0 04/30/2019 0044   GLUCOSEU NEGATIVE 04/30/2019 0044   HGBUR SMALL (A) 04/30/2019 0044   BILIRUBINUR NEGATIVE 04/30/2019 0044   KETONESUR NEGATIVE 04/30/2019 0044   PROTEINUR NEGATIVE 04/30/2019 0044   UROBILINOGEN 0.2 05/07/2017 1311   NITRITE NEGATIVE 04/30/2019 0044   LEUKOCYTESUR NEGATIVE 04/30/2019 0044   Sepsis Labs Invalid input(s): PROCALCITONIN,  WBC,  LACTICIDVEN Microbiology Recent Results (from the past 240 hour(s))  SARS Coronavirus 2 (CEPHEID - Performed in [facility] hospital lab),  Hosp Order     Status: None   Collection Time: 04/30/19  4:30 AM   Specimen: Nasopharyngeal Swab  Result Value Ref Range Status   SARS Coronavirus 2 NEGATIVE NEGATIVE Final    Comment: (NOTE) If result is NEGATIVE SARS-CoV-2 target nucleic acids are NOT DETECTED. The SARS-CoV-2 RNA is generally detectable in upper and lower  respiratory specimens during the acute phase of infection. The lowest  concentration of SARS-CoV-2 viral copies this assay can detect is 250  copies / mL. A negative result does not preclude SARS-CoV-2 infection  and should not be used as the sole basis for treatment or other  patient management decisions.  A negative result may occur with  improper specimen collection / handling, submission of specimen other  than nasopharyngeal swab, presence of viral mutation(s) within the  areas targeted by this assay, and inadequate number of viral copies  (<250 copies / mL). A negative result must be combined with clinical  observations, patient history, and epidemiological information. If result is POSITIVE SARS-CoV-2 target nucleic acids are DETECTED. The SARS-CoV-2 RNA is generally  detectable in upper and lower  respiratory specimens dur ing the acute phase of infection.  Positive  results are indicative of active infection with SARS-CoV-2.  Clinical  correlation with patient history and other diagnostic information is  necessary to determine patient infection status.  Positive results do  not rule out bacterial infection or co-infection with other viruses. If result is PRESUMPTIVE POSTIVE SARS-CoV-2 nucleic acids MAY BE PRESENT.   A presumptive positive result was obtained on the submitted specimen  and confirmed on repeat testing.  While 2019 novel coronavirus  (SARS-CoV-2) nucleic acids may be present in the submitted sample  additional confirmatory testing may be necessary for epidemiological  and / or clinical management purposes  to differentiate between   SARS-CoV-2 and other Sarbecovirus currently known to infect humans.  If clinically indicated additional testing with an alternate test  methodology [phone]) is advised. The SARS-CoV-2 RNA is generally  detectable in upper and lower respiratory sp ecimens during the acute  phase of infection. The expected result is Negative. Fact Sheet for Patients:  StrictlyIdeas.no Fact Sheet for Healthcare Providers: BankingDealers.co.za This test is not yet approved or cleared by the [place] FDA and has been authorized for detection and/or diagnosis of SARS-CoV-2 by FDA under an Emergency Use Authorization (EUA).  This EUA will remain in effect (meaning this test can be used) for the duration of the COVID-19 declaration under Section 564(b)(1) of the Act, 21 U.S.C. section 360bbb-3(b)(1), unless the authorization is terminated or revoked sooner. Performed at [facility], [place] [address], [place], [place] [address]      Time coordinating discharge: 18mins  SIGNED:   Kathie Dike, MD  [facility] 05/01/2019, 8:44 PM   If 7PM-7AM, please contact night-coverage [email or web address]

## 2019-05-01 NOTE — Progress Notes (Signed)
Discharge information and medication education given with teach back. Pt denies abdominal pain, nausea or vomiting.  Wallet was picked up from security and given to pt.  Pt questions and concerns were answered.  Peripheral iv removed, clean dry and intact, pressure and dressing applied.  Pt's belongings with pt: wallet,glasses, keys, clothes and shoes.   Pt stated that he was driving home. Pt was transported in wheelchair by nurse tech to entrance.

## 2019-05-03 ENCOUNTER — Encounter (HOSPITAL_COMMUNITY): Payer: Self-pay | Admitting: Emergency Medicine

## 2019-05-03 ENCOUNTER — Other Ambulatory Visit: Payer: Self-pay

## 2019-05-03 ENCOUNTER — Emergency Department (HOSPITAL_COMMUNITY): Payer: Medicare Other

## 2019-05-03 ENCOUNTER — Inpatient Hospital Stay (HOSPITAL_COMMUNITY)
Admission: EM | Admit: 2019-05-03 | Discharge: 2019-05-07 | DRG: 418 | Disposition: A | Discharge status: Home / Self Care | Payer: Medicare Other | Attending: Internal Medicine | Admitting: Internal Medicine

## 2019-05-03 DIAGNOSIS — Z982 Presence of cerebrospinal fluid drainage device: Secondary | ICD-10-CM

## 2019-05-03 DIAGNOSIS — R112 Nausea with vomiting, unspecified: Secondary | ICD-10-CM | POA: Diagnosis not present

## 2019-05-03 DIAGNOSIS — Z1159 Encounter for screening for other viral diseases: Secondary | ICD-10-CM

## 2019-05-03 DIAGNOSIS — Z885 Allergy status to narcotic agent status: Secondary | ICD-10-CM

## 2019-05-03 DIAGNOSIS — Z720 Tobacco use: Secondary | ICD-10-CM | POA: Diagnosis not present

## 2019-05-03 DIAGNOSIS — D751 Secondary polycythemia: Secondary | ICD-10-CM | POA: Diagnosis present

## 2019-05-03 DIAGNOSIS — K5732 Diverticulitis of large intestine without perforation or abscess without bleeding: Secondary | ICD-10-CM | POA: Diagnosis present

## 2019-05-03 DIAGNOSIS — K5792 Diverticulitis of intestine, part unspecified, without perforation or abscess without bleeding: Secondary | ICD-10-CM | POA: Diagnosis not present

## 2019-05-03 DIAGNOSIS — E785 Hyperlipidemia, unspecified: Secondary | ICD-10-CM | POA: Diagnosis present

## 2019-05-03 DIAGNOSIS — Z8349 Family history of other endocrine, nutritional and metabolic diseases: Secondary | ICD-10-CM

## 2019-05-03 DIAGNOSIS — Z7982 Long term (current) use of aspirin: Secondary | ICD-10-CM

## 2019-05-03 DIAGNOSIS — Z419 Encounter for procedure for purposes other than remedying health state, unspecified: Secondary | ICD-10-CM

## 2019-05-03 DIAGNOSIS — K8010 Calculus of gallbladder with chronic cholecystitis without obstruction: Principal | ICD-10-CM | POA: Diagnosis present

## 2019-05-03 DIAGNOSIS — Z888 Allergy status to other drugs, medicaments and biological substances status: Secondary | ICD-10-CM

## 2019-05-03 DIAGNOSIS — I10 Essential (primary) hypertension: Secondary | ICD-10-CM | POA: Diagnosis present

## 2019-05-03 DIAGNOSIS — K4020 Bilateral inguinal hernia, without obstruction or gangrene, not specified as recurrent: Secondary | ICD-10-CM | POA: Diagnosis present

## 2019-05-03 DIAGNOSIS — K660 Peritoneal adhesions (postprocedural) (postinfection): Secondary | ICD-10-CM | POA: Diagnosis present

## 2019-05-03 DIAGNOSIS — K429 Umbilical hernia without obstruction or gangrene: Secondary | ICD-10-CM | POA: Diagnosis present

## 2019-05-03 DIAGNOSIS — F17210 Nicotine dependence, cigarettes, uncomplicated: Secondary | ICD-10-CM | POA: Diagnosis present

## 2019-05-03 DIAGNOSIS — I160 Hypertensive urgency: Secondary | ICD-10-CM | POA: Diagnosis present

## 2019-05-03 LAB — BASIC METABOLIC PANEL
Anion gap: 13 (ref 5–15)
BUN: 13 mg/dL (ref 8–23)
CO2: 18 mmol/L — ABNORMAL LOW (ref 22–32)
Calcium: 9.3 mg/dL (ref 8.9–10.3)
Chloride: 107 mmol/L (ref 98–111)
Creatinine, Ser: 1.09 mg/dL (ref 0.61–1.24)
GFR calc Af Amer: >60 mL/min (ref >60)
GFR calc non Af Amer: >60 mL/min (ref >60)
Glucose, Bld: 142 mg/dL — ABNORMAL HIGH (ref 70–99)
Potassium: 3.6 mmol/L (ref 3.5–5.1)
Sodium: 138 mmol/L (ref 135–145)

## 2019-05-03 LAB — CBC WITH DIFFERENTIAL/PLATELET
Abs Immature Granulocytes: 0.07 10*3/uL (ref 0.00–0.07)
Basophils Absolute: 0.1 10*3/uL (ref 0.0–0.1)
Basophils Relative: 1 %
Eosinophils Absolute: 0.2 10*3/uL (ref 0.0–0.5)
Eosinophils Relative: 2 %
HCT: 58.3 % — ABNORMAL HIGH (ref 39.0–52.0)
Hemoglobin: 19.6 g/dL — ABNORMAL HIGH (ref 13.0–17.0)
Immature Granulocytes: 1 %
Lymphocytes Relative: 13 %
Lymphs Abs: 1.2 10*3/uL (ref 0.7–4.0)
MCH: 29.6 pg (ref 26.0–34.0)
MCHC: 33.6 g/dL (ref 30.0–36.0)
MCV: 88.1 fL (ref 80.0–100.0)
Monocytes Absolute: 0.9 10*3/uL (ref 0.1–1.0)
Monocytes Relative: 9 %
Neutro Abs: 7.2 10*3/uL (ref 1.7–7.7)
Neutrophils Relative %: 74 %
Platelets: 321 10*3/uL (ref 150–400)
RBC: 6.62 MIL/uL — ABNORMAL HIGH (ref 4.22–5.81)
RDW: 13.9 % (ref 11.5–15.5)
WBC: 9.7 10*3/uL (ref 4.0–10.5)
nRBC: 0 % (ref 0.0–0.2)

## 2019-05-03 LAB — SARS CORONAVIRUS 2 BY RT PCR (HOSPITAL ORDER, PERFORMED IN ~~LOC~~ HOSPITAL LAB): SARS Coronavirus 2: NEGATIVE

## 2019-05-03 MED ORDER — ENOXAPARIN SODIUM 40 MG/0.4ML ~~LOC~~ SOLN
40.0000 mg | SUBCUTANEOUS | Status: DC
  Administered 2019-05-03 – 2019-05-05 (×3): 40 mg via SUBCUTANEOUS
  Filled 2019-05-03 (×4): qty 0.4

## 2019-05-03 MED ORDER — ONDANSETRON HCL 4 MG/2ML IJ SOLN
4.0000 mg | Freq: Once | INTRAMUSCULAR | Status: AC
  Administered 2019-05-03: 4 mg via INTRAVENOUS
  Filled 2019-05-03: qty 2

## 2019-05-03 MED ORDER — ONDANSETRON HCL 4 MG/2ML IJ SOLN
4.0000 mg | Freq: Four times a day (QID) | INTRAMUSCULAR | Status: DC | PRN
  Administered 2019-05-04 – 2019-05-07 (×4): 4 mg via INTRAVENOUS
  Filled 2019-05-03 (×5): qty 2

## 2019-05-03 MED ORDER — FENTANYL CITRATE (PF) 100 MCG/2ML IJ SOLN
25.0000 ug | INTRAMUSCULAR | Status: DC | PRN
  Administered 2019-05-03 – 2019-05-05 (×6): 25 ug via INTRAVENOUS
  Filled 2019-05-03 (×7): qty 2

## 2019-05-03 MED ORDER — SODIUM CHLORIDE 0.9% FLUSH
3.0000 mL | Freq: Two times a day (BID) | INTRAVENOUS | Status: DC
  Administered 2019-05-03 – 2019-05-05 (×3): 3 mL via INTRAVENOUS

## 2019-05-03 MED ORDER — AMLODIPINE BESYLATE 10 MG PO TABS
10.0000 mg | ORAL_TABLET | Freq: Every day | ORAL | Status: DC
  Administered 2019-05-04 – 2019-05-07 (×4): 10 mg via ORAL
  Filled 2019-05-03: qty 1
  Filled 2019-05-03 (×3): qty 2

## 2019-05-03 MED ORDER — RISAQUAD PO CAPS
2.0000 | ORAL_CAPSULE | Freq: Every day | ORAL | Status: DC
  Administered 2019-05-03 – 2019-05-07 (×5): 2 via ORAL
  Filled 2019-05-03 (×5): qty 2

## 2019-05-03 MED ORDER — ONDANSETRON HCL 4 MG PO TABS: 4.0000 mg | ORAL_TABLET | Freq: Four times a day (QID) | ORAL | Status: DC | PRN

## 2019-05-03 MED ORDER — ACETAMINOPHEN 650 MG RE SUPP: 650.0000 mg | Freq: Four times a day (QID) | RECTAL | Status: DC | PRN

## 2019-05-03 MED ORDER — SODIUM CHLORIDE 0.9 % IV BOLUS
1000.0000 mL | Freq: Once | INTRAVENOUS | Status: AC
  Administered 2019-05-03: 12:00:00 1000 mL via INTRAVENOUS

## 2019-05-03 MED ORDER — HYDRALAZINE HCL 20 MG/ML IJ SOLN
10.0000 mg | INTRAMUSCULAR | Status: DC | PRN
  Administered 2019-05-07: 10 mg via INTRAVENOUS
  Filled 2019-05-03: qty 1

## 2019-05-03 MED ORDER — FENTANYL CITRATE (PF) 100 MCG/2ML IJ SOLN
50.0000 ug | Freq: Once | INTRAMUSCULAR | Status: AC
  Administered 2019-05-03: 14:00:00 50 ug via INTRAVENOUS
  Filled 2019-05-03: qty 2

## 2019-05-03 MED ORDER — METRONIDAZOLE IN NACL 5-0.79 MG/ML-% IV SOLN
500.0000 mg | Freq: Three times a day (TID) | INTRAVENOUS | Status: DC
  Administered 2019-05-03 – 2019-05-07 (×12): 500 mg via INTRAVENOUS
  Filled 2019-05-03 (×12): qty 100

## 2019-05-03 MED ORDER — CIPROFLOXACIN IN D5W 400 MG/200ML IV SOLN
400.0000 mg | Freq: Two times a day (BID) | INTRAVENOUS | Status: DC
  Administered 2019-05-03 – 2019-05-07 (×8): 400 mg via INTRAVENOUS
  Filled 2019-05-03 (×8): qty 200

## 2019-05-03 MED ORDER — ALBUTEROL SULFATE (2.5 MG/3ML) 0.083% IN NEBU: 2.5000 mg | INHALATION_SOLUTION | Freq: Four times a day (QID) | RESPIRATORY_TRACT | Status: DC | PRN

## 2019-05-03 MED ORDER — ACETAMINOPHEN 325 MG PO TABS: 650.0000 mg | ORAL_TABLET | Freq: Four times a day (QID) | ORAL | Status: DC | PRN

## 2019-05-03 MED ORDER — ASPIRIN EC 81 MG PO TBEC
81.0000 mg | DELAYED_RELEASE_TABLET | Freq: Every day | ORAL | Status: DC
  Administered 2019-05-03 – 2019-05-05 (×3): 81 mg via ORAL
  Filled 2019-05-03 (×3): qty 1

## 2019-05-03 NOTE — Plan of Care (Signed)
  Problem: Education: Goal: Knowledge of General Education information will improve Description: Including pain rating scale, medication(s)/side effects and non-pharmacologic comfort measures Outcome: Progressing   Problem: Clinical Measurements: Goal: Ability to maintain clinical measurements within normal limits will improve Outcome: Progressing   Problem: Clinical Measurements: Goal: Diagnostic test results will improve Outcome: Progressing   Problem: Pain Managment: Goal: General experience of comfort will improve Outcome: Progressing   

## 2019-05-03 NOTE — ED Provider Notes (Signed)
[facility] EMERGENCY DEPARTMENT Provider Note   CSN: [id number] Arrival date & time: 05/03/19  1123    History   Chief Complaint Chief Complaint  Patient presents with  . Emesis  . Hypertension    HPI Luis Henderson is a 63 y.o. male.     HPI Patient presents with abdominal pain nausea and vomiting.  Discharged from the hospital 2 days ago with a diverticulitis.  States has been on Cipro and Flagyl.  States he has been having difficulty keeping the medicines down.  States he was not able to afford the Compazine.  States he feels that his blood pressure is high.  Has a dull headache.  States he had some chills and possibly fever at home.  Still having bowel movements but recently has been less than he thought he was putting in. No chest pain Past Medical History:  Diagnosis Date  . Anxiety   . Depression   . History of prediabetes   . Hyperlipidemia   . Hypertension     Patient Active Problem List   Diagnosis Date Noted  . Diverticulitis 04/30/2019  . Acute diverticulitis 04/30/2019  . Inguinal hernia 10/03/2017  . Hypertensive urgency 05/31/2017  . Nausea and vomiting 05/31/2017  . Polycythemia 05/31/2017  . Nausea & vomiting 05/31/2017  . 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 Falls   . Anxiety and depression   . Chronic bilateral low back pain without sciatica   . ETOH abuse   . Tobacco abuse   . HLD (hyperlipidemia) 09/04/2016  . 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 Abnormal gait 09/03/2016  . Acute encephalopathy 09/03/2016  . Low back pain 06/09/2016  . HTN (hypertension) 04/01/2015    Past Surgical History:  Procedure Laterality Date  . CYSTOSCOPY WITH RETROGRADE PYELOGRAM, URETEROSCOPY AND STENT PLACEMENT Right 01/15/2015   Procedure: CYSTOSCOPY, RIGHT URETEROSCOPY/ RETROGRADE PYELOGRAM,HOLMIUM LASER/ LITHOTRIPSY, STENT PLACEMENT, URETERAL BALLOON DIALATION;  Surgeon: Kathie Rhodes, MD;  Location: [facility];  Service: Urology;   Laterality: Right;  . SKIN GRAFT     left hand  . VENTRICULOPERITONEAL SHUNT Right 09/04/2016   Procedure: SHUNT INSERTION VENTRICULAR-PERITONEAL;  Surgeon: Newman Pies, MD;  Location: [facility];  Service: Neurosurgery;  Laterality: Right;  . WISDOM TOOTH EXTRACTION          Home Medications    Prior to Admission medications   Medication Sig Start Date End Date Taking? Authorizing Provider  aspirin EC 81 MG tablet Take 81 mg by mouth daily.   Yes [provider]  ciprofloxacin (CIPRO) 500 MG tablet Take 1 tablet (500 mg total) by mouth 2 (two) times daily for 10 days. 05/01/19 05/11/19 Yes Kathie Dike, MD  metroNIDAZOLE (FLAGYL) 500 MG tablet Take 1 tablet (500 mg total) by mouth 3 (three) times daily for 10 days. 05/01/19 05/11/19 Yes Kathie Dike, MD  amLODipine (NORVASC) 10 MG tablet Take 1 tablet (10 mg total) by mouth daily. 10/03/17   Charlott Rakes, MD  prochlorperazine (COMPAZINE) 10 MG tablet Take 1 tablet (10 mg total) by mouth every 6 (six) hours as needed for nausea or vomiting. 05/01/19   Kathie Dike, MD    Family History Family History  Problem Relation Age of Onset  . Hyperlipidemia Father     Social History Social History   Tobacco Use  . Smoking status: Current Every Day Smoker    Packs/day: 1.00    Years: 28.00    Pack years: 28.00    Types: Cigarettes  .  Smokeless tobacco: Never Used  Substance Use Topics  . Alcohol use: Yes    Alcohol/week: 5.0 - 6.0 standard drinks    Types: 5 - 6 Cans of beer per week    Comment: daily alcohol/drinks beer, wine, and liquor  . Drug use: Yes    Types: Marijuana     Allergies   Codeine and Lisinopril   Review of Systems Review of Systems  Constitutional: Positive for appetite change and fever.  HENT: Negative for congestion.   Respiratory: Negative for shortness of breath.   Gastrointestinal: Positive for abdominal pain, nausea and vomiting.  Musculoskeletal: Negative for back pain.  Skin:  Negative for rash.  Neurological: Positive for headaches.     Physical Exam Updated Vital Signs BP (!) 183/86   Pulse 84   Temp 99 F (37.2 C) (Oral)   Resp 19   Ht 5\' 11"  (1.803 m)   Wt 90.7 kg   SpO2 100%   BMI 27.89 kg/m   Physical Exam Vitals signs and nursing note reviewed.  HENT:     Head: Normocephalic.  Eyes:     Pupils: Pupils are equal, round, and reactive to light.  Neck:     Musculoskeletal: Neck supple.  Cardiovascular:     Rate and Rhythm: Normal rate.  Pulmonary:     Effort: Pulmonary effort is normal.  Abdominal:     General: There is no distension.     Tenderness: There is abdominal tenderness.     Comments: Left lower quadrant tenderness without rebound or guarding.  No tenderness in inguinal areas.  No right upper quadrant tenderness.  Musculoskeletal:        General: No tenderness.  Skin:    General: Skin is warm.  Neurological:     Mental Status: He is alert.      ED Treatments / Results  Labs (all labs ordered are listed, but only abnormal results are displayed) Labs Reviewed  BASIC METABOLIC PANEL - Abnormal; Notable for the following components:      Result Value   CO2 18 (*)    Glucose, Bld 142 (*)    All other components within normal limits  CBC WITH DIFFERENTIAL/PLATELET - Abnormal; Notable for the following components:   RBC 6.62 (*)    Hemoglobin 19.6 (*)    HCT 58.3 (*)    All other components within normal limits  SARS CORONAVIRUS 2 (HOSPITAL ORDER, [facility])    EKG EKG Interpretation  Date/Time:  Saturday May 03 2019 12:07:57 EDT Ventricular Rate:  81 PR Interval:    QRS Duration: 106 QT Interval:  381 QTC Calculation: 443 R Axis:   91 Text Interpretation:  Sinus rhythm Right axis deviation Confirmed by Davonna Belling [phone]) on 05/03/2019 12:18:55 PM Also confirmed by Davonna Belling [phone]), editor Philomena Doheny [phone])  on 05/03/2019 2:19:52 PM   Radiology Dg Abdomen Acute  W/chest  Result Date: 05/03/2019 CLINICAL DATA:  Abdominal pain and vomiting since last night. Hypertension. Dizziness. EXAM: DG ABDOMEN ACUTE W/ 1V CHEST COMPARISON:  KUB 05/31/2017 and chest x-ray 05/31/2017 FINDINGS: Lungs are adequately inflated and otherwise clear. Right-sided ventriculostomy catheter unchanged. Cardiomediastinal silhouette and remainder of the thorax is unchanged. Abdominopelvic images demonstrate a nonobstructive bowel gas pattern. No free peritoneal air. Ventriculostomy catheter is coiled over the central abdomen without significant change. Rounded 10 cm mass over the left abdomen compatible with known large left renal cyst. No 9 mm right renal stone. Remainder the exam  is unchanged. IMPRESSION: No acute cardiopulmonary disease.  Nonobstructive bowel gas pattern. Right-sided nephrolithiasis.  Known large left renal cyst. Ventriculostomy catheter unchanged. Electronically Signed   By: Marin Olp M.D.   On: 05/03/2019 14:28    Procedures Procedures (including critical care time)  Medications Ordered in ED Medications  sodium chloride 0.9 % bolus 1,000 mL (1,000 mLs Intravenous New Bag/Given 05/03/19 1208)  ondansetron (ZOFRAN) injection 4 mg (4 mg Intravenous Given 05/03/19 1224)  fentaNYL (SUBLIMAZE) injection 50 mcg (50 mcg Intravenous Given 05/03/19 1332)     Initial Impression / Assessment and Plan / ED Course  I have reviewed the triage vital signs and the nursing notes.  Pertinent labs & imaging results that were available during my care of the patient were reviewed by me and considered in my medical decision making (see chart for details).        Patient with abdominal pain.  Nausea and vomiting.  Unable to keep down his medicines.  Recently treated for diverticulitis.  Since he is unable to tolerate oral medications will require admission to the hospital.  Labs reassuring.  Blood pressure improved somewhat after pain medicines.  Final Clinical Impressions(s)  / ED Diagnoses   Final diagnoses:  Diverticulitis    ED Discharge Orders    None       Davonna Belling, MD 05/03/19 1436

## 2019-05-03 NOTE — ED Notes (Signed)
ED TO INPATIENT HANDOFF REPORT  ED Nurse Name and Phone #: Kathlee Nations [phone]  S Name/Age/Gender Luis Henderson 63 y.o. male Room/Bed: 031C/031C  Code Status   Code Status: Prior  Home/SNF/Other [employer] Patient oriented to: self, place, time and situation Is this baseline? Yes   Triage Complete: Triage complete  Chief Complaint not able to hold anything down  Triage Note Pt arrives POV with c/o of vomiting last night X 4. Pt also states he has not taken his blood pressure meds in 4 days and is having a hypertension crisis.  Endorses dizziness and is actively  Vomiting.   B/P  214/105 P 88 Temp 99.0   Allergies Allergies  Allergen Reactions  . Codeine Itching  . Lisinopril Cough    Level of Care/Admitting Diagnosis ED Disposition    ED Disposition Condition [facility]: [facility] [[id number]]  Level of Care: Med-Surg [16]  I expect the patient will be discharged within 24 hours: No (not a candidate for 5C-Observation unit)  Covid Evaluation: Asymptomatic Screening Protocol (No Symptoms)  Diagnosis: Nausea and vomiting [885027]  Admitting Physician: Norval Morton [[id number]]  Attending Physician: Norval Morton [[id number]]  PT Class (Do Not Modify): Observation [104]  PT Acc Code (Do Not Modify): Observation [10022]       B Medical/Surgery History Past Medical History:  Diagnosis Date  . Anxiety   . Depression   . History of prediabetes   . Hyperlipidemia   . Hypertension    Past Surgical History:  Procedure Laterality Date  . CYSTOSCOPY WITH RETROGRADE PYELOGRAM, URETEROSCOPY AND STENT PLACEMENT Right 01/15/2015   Procedure: CYSTOSCOPY, RIGHT URETEROSCOPY/ RETROGRADE PYELOGRAM,HOLMIUM LASER/ LITHOTRIPSY, STENT PLACEMENT, URETERAL BALLOON DIALATION;  Surgeon: Kathie Rhodes, MD;  Location: [facility];  Service: Urology;  Laterality: Right;  . SKIN GRAFT     left hand  . VENTRICULOPERITONEAL SHUNT Right 09/04/2016   Procedure:  SHUNT INSERTION VENTRICULAR-PERITONEAL;  Surgeon: Newman Pies, MD;  Location: [facility];  Service: Neurosurgery;  Laterality: Right;  . WISDOM TOOTH EXTRACTION       A IV Location/Drains/Wounds Patient Lines/Drains/Airways Status   Active Line/Drains/Airways    Name:   Placement date:   Placement time:   Site:   Days:   Peripheral IV 05/03/19 Right Forearm   05/03/19    1150    Forearm   less than 1          Intake/Output Last 24 hours  Intake/Output Summary (Last 24 hours) at 05/03/2019 1552 Last data filed at 05/03/2019 1545 Gross per 24 hour  Intake 1000 ml  Output -  Net 1000 ml    Labs/Imaging Results for orders placed or performed during the hospital encounter of 05/03/19 (from the past 48 hour(s))  Basic metabolic panel     Status: Abnormal   Collection Time: 05/03/19 11:48 AM  Result Value Ref Range   Sodium 138 135 - 145 mmol/L   Potassium 3.6 3.5 - 5.1 mmol/L   Chloride 107 98 - 111 mmol/L   CO2 18 (L) 22 - 32 mmol/L   Glucose, Bld 142 (H) 70 - 99 mg/dL   BUN 13 8 - 23 mg/dL   Creatinine, Ser 1.09 0.61 - 1.24 mg/dL   Calcium 9.3 8.9 - 10.3 mg/dL   GFR calc non Af Amer >60 >60 mL/min   GFR calc Af Amer >60 >60 mL/min   Anion gap 13 5 - 15    Comment: Performed  at [facility], [place] [address], [place], [place] [address]  CBC with Differential     Status: Abnormal   Collection Time: 05/03/19 11:48 AM  Result Value Ref Range   WBC 9.7 4.0 - 10.5 K/uL   RBC 6.62 (H) 4.22 - 5.81 MIL/uL   Hemoglobin 19.6 (H) 13.0 - 17.0 g/dL   HCT 58.3 (H) 39.0 - 52.0 %   MCV 88.1 80.0 - 100.0 fL   MCH 29.6 26.0 - 34.0 pg   MCHC 33.6 30.0 - 36.0 g/dL   RDW 13.9 11.5 - 15.5 %   Platelets 321 150 - 400 K/uL   nRBC 0.0 0.0 - 0.2 %   Neutrophils Relative % 74 %   Neutro Abs 7.2 1.7 - 7.7 K/uL   Lymphocytes Relative 13 %   Lymphs Abs 1.2 0.7 - 4.0 K/uL   Monocytes Relative 9 %   Monocytes Absolute 0.9 0.1 - 1.0 K/uL   Eosinophils Relative 2 %   Eosinophils Absolute 0.2  0.0 - 0.5 K/uL   Basophils Relative 1 %   Basophils Absolute 0.1 0.0 - 0.1 K/uL   Immature Granulocytes 1 %   Abs Immature Granulocytes 0.07 0.00 - 0.07 K/uL    Comment: Performed at [facility] [address] [place], [place] [address]  SARS Coronavirus 2 (CEPHEID - Performed in [facility] hospital lab), Hosp Order     Status: None   Collection Time: 05/03/19 12:47 PM   Specimen: Nasopharyngeal Swab  Result Value Ref Range   SARS Coronavirus 2 NEGATIVE NEGATIVE    Comment: (NOTE) If result is NEGATIVE SARS-CoV-2 target nucleic acids are NOT DETECTED. The SARS-CoV-2 RNA is generally detectable in upper and lower  respiratory specimens during the acute phase of infection. The lowest  concentration of SARS-CoV-2 viral copies this assay can detect is 250  copies / mL. A negative result does not preclude SARS-CoV-2 infection  and should not be used as the sole basis for treatment or other  patient management decisions.  A negative result may occur with  improper specimen collection / handling, submission of specimen other  than nasopharyngeal swab, presence of viral mutation(s) within the  areas targeted by this assay, and inadequate number of viral copies  (<250 copies / mL). A negative result must be combined with clinical  observations, patient history, and epidemiological information. If result is POSITIVE SARS-CoV-2 target nucleic acids are DETECTED. The SARS-CoV-2 RNA is generally detectable in upper and lower  respiratory specimens dur ing the acute phase of infection.  Positive  results are indicative of active infection with SARS-CoV-2.  Clinical  correlation with patient history and other diagnostic information is  necessary to determine patient infection status.  Positive results do  not rule out bacterial infection or co-infection with other viruses. If result is PRESUMPTIVE POSTIVE SARS-CoV-2 nucleic acids MAY BE PRESENT.   A presumptive positive result was  obtained on the submitted specimen  and confirmed on repeat testing.  While 2019 novel coronavirus  (SARS-CoV-2) nucleic acids may be present in the submitted sample  additional confirmatory testing may be necessary for epidemiological  and / or clinical management purposes  to differentiate between  SARS-CoV-2 and other Sarbecovirus currently known to infect humans.  If clinically indicated additional testing with an alternate test  methodology [phone]) is advised. The SARS-CoV-2 RNA is generally  detectable in upper and lower respiratory sp ecimens during the acute  phase of infection. The expected result is  Negative. Fact Sheet for Patients:  StrictlyIdeas.no Fact Sheet for Healthcare Providers: BankingDealers.co.za This test is not yet approved or cleared by the [place] FDA and has been authorized for detection and/or diagnosis of SARS-CoV-2 by FDA under an Emergency Use Authorization (EUA).  This EUA will remain in effect (meaning this test can be used) for the duration of the COVID-19 declaration under Section 564(b)(1) of the Act, 21 U.S.C. section 360bbb-3(b)(1), unless the authorization is terminated or revoked sooner. Performed at [facility], [place] [address] [place], [place] [address]    Dg Abdomen Acute W/chest  Result Date: 05/03/2019 CLINICAL DATA:  Abdominal pain and vomiting since last night. Hypertension. Dizziness. EXAM: DG ABDOMEN ACUTE W/ 1V CHEST COMPARISON:  KUB 05/31/2017 and chest x-ray 05/31/2017 FINDINGS: Lungs are adequately inflated and otherwise clear. Right-sided ventriculostomy catheter unchanged. Cardiomediastinal silhouette and remainder of the thorax is unchanged. Abdominopelvic images demonstrate a nonobstructive bowel gas pattern. No free peritoneal air. Ventriculostomy catheter is coiled over the central abdomen without significant change. Rounded 10 cm mass over the left abdomen compatible  with known large left renal cyst. No 9 mm right renal stone. Remainder the exam is unchanged. IMPRESSION: No acute cardiopulmonary disease.  Nonobstructive bowel gas pattern. Right-sided nephrolithiasis.  Known large left renal cyst. Ventriculostomy catheter unchanged. Electronically Signed   By: Marin Olp M.D.   On: 05/03/2019 14:28    Pending Labs Unresulted Labs (From admission, onward)   None      Vitals/Pain Today's Vitals   05/03/19 1500 05/03/19 1515 05/03/19 1515 05/03/19 1530  BP: (!) 142/74 140/76  (!) 189/98  Pulse: 61 (!) 53  72  Resp: 19 18  (!) 21  Temp:      TempSrc:      SpO2: 95% 95%  98%  Weight:      Height:      PainSc:   4      Isolation Precautions No active isolations  Medications Medications  sodium chloride 0.9 % bolus 1,000 mL (0 mLs Intravenous Stopped 05/03/19 1545)  ondansetron (ZOFRAN) injection 4 mg (4 mg Intravenous Given 05/03/19 1224)  fentaNYL (SUBLIMAZE) injection 50 mcg (50 mcg Intravenous Given 05/03/19 1332)    Mobility walks Low fall risk   Focused Assessments observation   R Recommendations: See Admitting Provider Note  Report given to:   Additional Notes:

## 2019-05-03 NOTE — H&P (Signed)
History and Physical    Luis Henderson PXT:[id number] DOB: April 07, 1956 DOA: 05/03/2019  Referring MD/NP/PA: Davonna Belling, MD PCP: Ladell Pier, MD  Patient coming from: Home  Chief Complaint: Nausea and vomiting  I have personally briefly reviewed patient's old medical records in [place]   HPI: Luis Henderson is a 63 y.o. 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  Previous imaging studies revealed uncomplicated diverticulitis, possible chronic cholecystitis, and bilateral inguinal hernias.  Patient was to follow-up with general surgery in the outpatient setting for both issues once diverticulitis resolved.  After initially getting home he was fine the first night and had sleeps to.  However, overnight and into the next morning developed slightly right of midline abdominal pain.  Describes it as cramping and sharp in nature radiating upward.  He reports only being unable to keep down 1 dose of his antibiotics prior to having nausea and vomiting.  He was unable to afford the antiemetic medications given.  Since that time pain continued and he was unable to keep any significant amount of food or liquids down for which he came back to the hospital today.  Denies having any fever, dysuria, or diarrhea.  Complains of having to strain to try and have a bowel movement but is still able to pass some flatus.   ED Course: Upon admission into the emergency department patient was noted to be afebrile.  Pulse 53-97, respirations 14-28, blood pressure elevated up to 214/105, and O2 saturation maintained on room air.  Labs revealed WBC 9.7, hemoglobin 19.6, and all other labs relatively within normal limits.  Acute abdominal x-rays revealed no  acute cardiopulmonary disease, nonobstructive bowel pattern, and chronic right nephrolithiasis.  Patient was given fentany 50 mcg, Zofran 4 mg IV, and 1 L normal saline IV fluids in the emergency department without improvement in nausea and vomiting symptoms.  TRH called to admit.  Review of Systems  Constitutional: Positive for malaise/fatigue. Negative for chills.  HENT: Negative for congestion.   Eyes: Negative for photophobia and pain.  Respiratory: Negative for cough and shortness of breath.   Cardiovascular: Negative for chest pain and leg swelling.  Gastrointestinal: Positive for abdominal pain, nausea and vomiting. Negative for blood in stool and diarrhea.  Genitourinary: Negative for dysuria and hematuria.  Musculoskeletal: Negative for falls and myalgias.  Skin: Negative for itching and rash.  Neurological: Negative for focal weakness and seizures.  Psychiatric/Behavioral: Negative for depression. The patient is not nervous/anxious.     Past Medical History:  Diagnosis Date   Anxiety    Depression    History of prediabetes    Hyperlipidemia    Hypertension     Past Surgical History:  Procedure Laterality Date   CYSTOSCOPY WITH RETROGRADE PYELOGRAM, URETEROSCOPY AND STENT PLACEMENT Right 01/15/2015   Procedure: CYSTOSCOPY, RIGHT URETEROSCOPY/ RETROGRADE PYELOGRAM,HOLMIUM LASER/ LITHOTRIPSY, STENT PLACEMENT, URETERAL BALLOON DIALATION;  Surgeon: Kathie Rhodes, MD;  Location: [facility];  Service: Urology;  Laterality: Right;   SKIN GRAFT     left hand   VENTRICULOPERITONEAL SHUNT Right 09/04/2016   Procedure: SHUNT INSERTION VENTRICULAR-PERITONEAL;  Surgeon: Newman Pies, MD;  Location: [facility];  Service: Neurosurgery;  Laterality: Right;   WISDOM TOOTH EXTRACTION       reports that he has been smoking cigarettes. He has a  28.00 pack-year smoking history. He has never used smokeless tobacco. He reports current alcohol use of about 5.0 - 6.0 standard drinks of alcohol  per week. He reports current drug use. Drug: Marijuana.  Allergies  Allergen Reactions   Codeine Itching   Lisinopril Cough    Family History  Problem Relation Age of Onset   Hyperlipidemia Father     Prior to Admission medications   Medication Sig Start Date End Date Taking? Authorizing Provider  aspirin EC 81 MG tablet Take 81 mg by mouth daily.   Yes [provider]  ciprofloxacin (CIPRO) 500 MG tablet Take 1 tablet (500 mg total) by mouth 2 (two) times daily for 10 days. 05/01/19 05/11/19 Yes Kathie Dike, MD  metroNIDAZOLE (FLAGYL) 500 MG tablet Take 1 tablet (500 mg total) by mouth 3 (three) times daily for 10 days. 05/01/19 05/11/19 Yes Kathie Dike, MD  amLODipine (NORVASC) 10 MG tablet Take 1 tablet (10 mg total) by mouth daily. 10/03/17   Charlott Rakes, MD  prochlorperazine (COMPAZINE) 10 MG tablet Take 1 tablet (10 mg total) by mouth every 6 (six) hours as needed for nausea or vomiting. 05/01/19   Kathie Dike, MD    Physical Exam:  Constitutional: Middle-age male who appears to be in some mild discomfort Vitals:   05/03/19 1345 05/03/19 1445 05/03/19 1500 05/03/19 1515  BP: (!) 183/86 132/79 (!) 142/74 140/76  Pulse:  (!) 53 61 (!) 53  Resp: 19 19 19 18   Temp:      TempSrc:      SpO2:  94% 95% 95%  Weight:      Height:       Eyes: PERRL, lids and conjunctivae normal ENMT: Mucous membranes are dry.  Posterior pharynx clear of any exudate or lesions.Normal dentition.  Neck: normal, supple, no masses, no thyromegaly Respiratory: clear to auscultation bilaterally, no wheezing, no crackles. Normal respiratory effort. No accessory muscle use.  Cardiovascular: Regular rate and rhythm, no murmurs / rubs / gallops. No extremity edema. 2+ pedal pulses. No carotid bruits.  Abdomen: Mild epigastric tenderness, no masses palpated. No hepatosplenomegaly. Bowel sounds positive.  Musculoskeletal: no clubbing / cyanosis. No joint deformity upper and lower  extremities. Good ROM, no contractures. Normal muscle tone.  Skin: no rashes, lesions, ulcers. No induration Neurologic: CN 2-12 grossly intact. Sensation intact, DTR normal. Strength 5/5 in all 4.  Psychiatric: Normal judgment and insight. Alert and oriented x 3. Normal mood.     Labs on Admission: I have personally reviewed following labs and imaging studies  CBC: Recent Labs  Lab 04/30/19 0044 05/01/19 0442 05/03/19 1148  WBC 11.1* 13.0* 9.7  NEUTROABS  --  9.1* 7.2  HGB 18.2* 18.4* 19.6*  HCT 55.0* 55.8* 58.3*  MCV 88.9 88.6 88.1  PLT 427* 335 673   Basic Metabolic Panel: Recent Labs  Lab 04/30/19 0044 05/01/19 0442 05/03/19 1148  NA 138 141 138  K 3.8 3.3* 3.6  CL 106 107 107  CO2 21* 20* 18*  GLUCOSE 119* 100* 142*  BUN 13 14 13   CREATININE 1.21 1.13 1.09  CALCIUM 9.3 8.9 9.3   GFR: Estimated Creatinine Clearance: 78.9 mL/min (by C-G formula based on SCr of 1.09 mg/dL). Liver Function Tests: Recent Labs  Lab 04/30/19 0044 05/01/19 0442  AST 19 15  ALT 17 17  ALKPHOS 82 80  BILITOT 0.9 1.2  PROT 6.7 6.3*  ALBUMIN 4.2 3.9   Recent Labs  Lab 04/30/19 0044  LIPASE 108*  No results for input(s): AMMONIA in the last 168 hours. Coagulation Profile: No results for input(s): INR, PROTIME in the last 168 hours. Cardiac Enzymes: No results for input(s): CKTOTAL, CKMB, CKMBINDEX, TROPONINI in the last 168 hours. BNP (last 3 results) No results for input(s): PROBNP in the last 8760 hours. HbA1C: No results for input(s): HGBA1C in the last 72 hours. CBG: No results for input(s): GLUCAP in the last 168 hours. Lipid Profile: No results for input(s): CHOL, HDL, LDLCALC, TRIG, CHOLHDL, LDLDIRECT in the last 72 hours. Thyroid Function Tests: No results for input(s): TSH, T4TOTAL, FREET4, T3FREE, THYROIDAB in the last 72 hours. Anemia Panel: No results for input(s): VITAMINB12, FOLATE, FERRITIN, TIBC, IRON, RETICCTPCT in the last 72 hours. Urine analysis:     Component Value Date/Time   COLORURINE YELLOW 04/30/2019 0044   APPEARANCEUR CLEAR 04/30/2019 0044   LABSPEC 1.011 04/30/2019 0044   PHURINE 5.0 04/30/2019 0044   GLUCOSEU NEGATIVE 04/30/2019 0044   HGBUR SMALL (A) 04/30/2019 0044   BILIRUBINUR NEGATIVE 04/30/2019 0044   KETONESUR NEGATIVE 04/30/2019 0044   PROTEINUR NEGATIVE 04/30/2019 0044   UROBILINOGEN 0.2 05/07/2017 1311   NITRITE NEGATIVE 04/30/2019 0044   LEUKOCYTESUR NEGATIVE 04/30/2019 0044   Sepsis Labs: Recent Results (from the past 240 hour(s))  SARS Coronavirus 2 (CEPHEID - Performed in [place] hospital lab), Hosp Order     Status: None   Collection Time: 04/30/19  4:30 AM   Specimen: Nasopharyngeal Swab  Result Value Ref Range Status   SARS Coronavirus 2 NEGATIVE NEGATIVE Final    Comment: (NOTE) If result is NEGATIVE SARS-CoV-2 target nucleic acids are NOT DETECTED. The SARS-CoV-2 RNA is generally detectable in upper and lower  respiratory specimens during the acute phase of infection. The lowest  concentration of SARS-CoV-2 viral copies this assay can detect is 250  copies / mL. A negative result does not preclude SARS-CoV-2 infection  and should not be used as the sole basis for treatment or other  patient management decisions.  A negative result may occur with  improper specimen collection / handling, submission of specimen other  than nasopharyngeal swab, presence of viral mutation(s) within the  areas targeted by this assay, and inadequate number of viral copies  (<250 copies / mL). A negative result must be combined with clinical  observations, patient history, and epidemiological information. If result is POSITIVE SARS-CoV-2 target nucleic acids are DETECTED. The SARS-CoV-2 RNA is generally detectable in upper and lower  respiratory specimens dur ing the acute phase of infection.  Positive  results are indicative of active infection with SARS-CoV-2.  Clinical  correlation with patient history and  other diagnostic information is  necessary to determine patient infection status.  Positive results do  not rule out bacterial infection or co-infection with other viruses. If result is PRESUMPTIVE POSTIVE SARS-CoV-2 nucleic acids MAY BE PRESENT.   A presumptive positive result was obtained on the submitted specimen  and confirmed on repeat testing.  While 2019 novel coronavirus  (SARS-CoV-2) nucleic acids may be present in the submitted sample  additional confirmatory testing may be necessary for epidemiological  and / or clinical management purposes  to differentiate between  SARS-CoV-2 and other Sarbecovirus currently known to infect humans.  If clinically indicated additional testing with an alternate test  methodology [phone]) is advised. The SARS-CoV-2 RNA is generally  detectable in upper and lower respiratory sp ecimens during the acute  phase of infection. The expected result is Negative. Fact Sheet for Patients:  StrictlyIdeas.no Fact Sheet for Healthcare Providers: BankingDealers.co.za This test is not yet approved or cleared by the [place] FDA and has been authorized for detection and/or diagnosis of SARS-CoV-2 by FDA under an Emergency Use Authorization (EUA).  This EUA will remain in effect (meaning this test can be used) for the duration of the COVID-19 declaration under Section 564(b)(1) of the Act, 21 U.S.C. section 360bbb-3(b)(1), unless the authorization is terminated or revoked sooner. Performed at [facility], [place] [address] [place], [place] [address]   SARS Coronavirus 2 (CEPHEID - Performed in [place] hospital lab), Hosp Order     Status: None   Collection Time: 05/03/19 12:47 PM   Specimen: Nasopharyngeal Swab  Result Value Ref Range Status   SARS Coronavirus 2 NEGATIVE NEGATIVE Final    Comment: (NOTE) If result is NEGATIVE SARS-CoV-2 target nucleic acids are NOT DETECTED. The SARS-CoV-2  RNA is generally detectable in upper and lower  respiratory specimens during the acute phase of infection. The lowest  concentration of SARS-CoV-2 viral copies this assay can detect is 250  copies / mL. A negative result does not preclude SARS-CoV-2 infection  and should not be used as the sole basis for treatment or other  patient management decisions.  A negative result may occur with  improper specimen collection / handling, submission of specimen other  than nasopharyngeal swab, presence of viral mutation(s) within the  areas targeted by this assay, and inadequate number of viral copies  (<250 copies / mL). A negative result must be combined with clinical  observations, patient history, and epidemiological information. If result is POSITIVE SARS-CoV-2 target nucleic acids are DETECTED. The SARS-CoV-2 RNA is generally detectable in upper and lower  respiratory specimens dur ing the acute phase of infection.  Positive  results are indicative of active infection with SARS-CoV-2.  Clinical  correlation with patient history and other diagnostic information is  necessary to determine patient infection status.  Positive results do  not rule out bacterial infection or co-infection with other viruses. If result is PRESUMPTIVE POSTIVE SARS-CoV-2 nucleic acids MAY BE PRESENT.   A presumptive positive result was obtained on the submitted specimen  and confirmed on repeat testing.  While 2019 novel coronavirus  (SARS-CoV-2) nucleic acids may be present in the submitted sample  additional confirmatory testing may be necessary for epidemiological  and / or clinical management purposes  to differentiate between  SARS-CoV-2 and other Sarbecovirus currently known to infect humans.  If clinically indicated additional testing with an alternate test  methodology [phone]) is advised. The SARS-CoV-2 RNA is generally  detectable in upper and lower respiratory sp ecimens during the acute  phase of  infection. The expected result is Negative. Fact Sheet for Patients:  StrictlyIdeas.no Fact Sheet for Healthcare Providers: BankingDealers.co.za This test is not yet approved or cleared by the [place] FDA and has been authorized for detection and/or diagnosis of SARS-CoV-2 by FDA under an Emergency Use Authorization (EUA).  This EUA will remain in effect (meaning this test can be used) for the duration of the COVID-19 declaration under Section 564(b)(1) of the Act, 21 U.S.C. section 360bbb-3(b)(1), unless the authorization is terminated or revoked sooner. Performed at [facility], [place] [address] [place], [place] [address]      Radiological Exams on Admission: Dg Abdomen Acute W/chest  Result Date: 05/03/2019 CLINICAL DATA:  Abdominal pain and vomiting since last night. Hypertension. Dizziness. EXAM: DG ABDOMEN ACUTE W/ 1V CHEST COMPARISON:  KUB  05/31/2017 and chest x-ray 05/31/2017 FINDINGS: Lungs are adequately inflated and otherwise clear. Right-sided ventriculostomy catheter unchanged. Cardiomediastinal silhouette and remainder of the thorax is unchanged. Abdominopelvic images demonstrate a nonobstructive bowel gas pattern. No free peritoneal air. Ventriculostomy catheter is coiled over the central abdomen without significant change. Rounded 10 cm mass over the left abdomen compatible with known large left renal cyst. No 9 mm right renal stone. Remainder the exam is unchanged. IMPRESSION: No acute cardiopulmonary disease.  Nonobstructive bowel gas pattern. Right-sided nephrolithiasis.  Known large left renal cyst. Ventriculostomy catheter unchanged. Electronically Signed   By: Marin Olp M.D.   On: 05/03/2019 14:28    EKG: Independently reviewed.  Sinus rhythm 81 bpm  Assessment/Plan Sigmoid diverticulitis: Unresolved.  Patient reports continued pain after recently being diagnosed with diverticulitis on 7/15.  Abdominal  x-rays revealed a nonobstructive bowel gas pattern.  Previous leukocytosis has now resolved. -Admit to a MedSurg bed -Ciprofloxacin and metronidazole IV -Fentanyl IV as needed pain -Probiotics  Nausea and vomiting: Acute.  Patient reports inability to keep any of oral medications down after the first night.  Secondary to above. -Antiemetics as needed -Clear liquids and advance diet as toleratedas tolerated   Hypertensive urgency: Acute.  Patient initial blood pressure elevated up to 214/105 on admission.  He thought that he was told to stop the amlodipine after last discharge.  Likely multifactorial in the absence of patient taking his blood pressure medications and pain. -Continue amlodipine -Hydralazine IV as needed  Polycythemia: Chronic.  Hemoglobin 19.6 g/dL on admission -Continue outpatient management  Chronic cholecystitis, bilateral inguinal hernias: As seen on previous CT scan of the abdomen and pelvis from 7/15.  Seen by general surgery during last hospitalization and recommended outpatient follow-up once resolution of diverticulitis. -Follow-up with outpatient general surgery as previously advised  History of of obstructive hydrocephalus: Status post VP shunt.  Tobacco use: Patient reports just recently starting back smoking cigarettes after quitting for over 16 years.  He declined need of a nicotine patch. -Counseled on the need of cessation of tobacco use  DVT prophylaxis: Lovenox Code Status: Full Family Communication: No family present at bedside Disposition Plan: Possible discharge home in 1 to 2 days Consults called: None Admission status: Observation  Norval Morton MD [facility] Pager [phone]   If 7PM-7AM, please contact night-coverage [email or web address] Password [facility]  05/03/2019, 3:38 PM

## 2019-05-03 NOTE — ED Triage Notes (Signed)
Pt arrives POV with c/o of vomiting last night X 4. Pt also states he has not taken his blood pressure meds in 4 days and is having a hypertension crisis.  Endorses dizziness and is actively  Vomiting.   B/P  214/105 P 88 Temp 99.0

## 2019-05-04 ENCOUNTER — Other Ambulatory Visit: Payer: Self-pay

## 2019-05-04 DIAGNOSIS — I10 Essential (primary) hypertension: Secondary | ICD-10-CM | POA: Diagnosis present

## 2019-05-04 DIAGNOSIS — Z8349 Family history of other endocrine, nutritional and metabolic diseases: Secondary | ICD-10-CM | POA: Diagnosis not present

## 2019-05-04 DIAGNOSIS — K429 Umbilical hernia without obstruction or gangrene: Secondary | ICD-10-CM | POA: Diagnosis present

## 2019-05-04 DIAGNOSIS — K5732 Diverticulitis of large intestine without perforation or abscess without bleeding: Secondary | ICD-10-CM | POA: Diagnosis present

## 2019-05-04 DIAGNOSIS — K660 Peritoneal adhesions (postprocedural) (postinfection): Secondary | ICD-10-CM | POA: Diagnosis present

## 2019-05-04 DIAGNOSIS — D751 Secondary polycythemia: Secondary | ICD-10-CM | POA: Diagnosis present

## 2019-05-04 DIAGNOSIS — Z1159 Encounter for screening for other viral diseases: Secondary | ICD-10-CM | POA: Diagnosis not present

## 2019-05-04 DIAGNOSIS — K811 Chronic cholecystitis: Secondary | ICD-10-CM | POA: Diagnosis not present

## 2019-05-04 DIAGNOSIS — Z982 Presence of cerebrospinal fluid drainage device: Secondary | ICD-10-CM | POA: Diagnosis not present

## 2019-05-04 DIAGNOSIS — Z720 Tobacco use: Secondary | ICD-10-CM | POA: Diagnosis not present

## 2019-05-04 DIAGNOSIS — F17210 Nicotine dependence, cigarettes, uncomplicated: Secondary | ICD-10-CM | POA: Diagnosis present

## 2019-05-04 DIAGNOSIS — K8010 Calculus of gallbladder with chronic cholecystitis without obstruction: Secondary | ICD-10-CM | POA: Diagnosis present

## 2019-05-04 DIAGNOSIS — K5792 Diverticulitis of intestine, part unspecified, without perforation or abscess without bleeding: Secondary | ICD-10-CM | POA: Diagnosis not present

## 2019-05-04 DIAGNOSIS — R112 Nausea with vomiting, unspecified: Secondary | ICD-10-CM | POA: Diagnosis not present

## 2019-05-04 DIAGNOSIS — K4020 Bilateral inguinal hernia, without obstruction or gangrene, not specified as recurrent: Secondary | ICD-10-CM | POA: Diagnosis present

## 2019-05-04 DIAGNOSIS — R1011 Right upper quadrant pain: Secondary | ICD-10-CM | POA: Diagnosis present

## 2019-05-04 DIAGNOSIS — E785 Hyperlipidemia, unspecified: Secondary | ICD-10-CM | POA: Diagnosis present

## 2019-05-04 DIAGNOSIS — Z888 Allergy status to other drugs, medicaments and biological substances status: Secondary | ICD-10-CM | POA: Diagnosis not present

## 2019-05-04 DIAGNOSIS — Z7982 Long term (current) use of aspirin: Secondary | ICD-10-CM | POA: Diagnosis not present

## 2019-05-04 DIAGNOSIS — I160 Hypertensive urgency: Secondary | ICD-10-CM | POA: Diagnosis present

## 2019-05-04 DIAGNOSIS — Z885 Allergy status to narcotic agent status: Secondary | ICD-10-CM | POA: Diagnosis not present

## 2019-05-04 LAB — CBC
HCT: 50.9 % (ref 39.0–52.0)
Hemoglobin: 16.9 g/dL (ref 13.0–17.0)
MCH: 29.1 pg (ref 26.0–34.0)
MCHC: 33.2 g/dL (ref 30.0–36.0)
MCV: 87.8 fL (ref 80.0–100.0)
Platelets: 289 10*3/uL (ref 150–400)
RBC: 5.8 MIL/uL (ref 4.22–5.81)
RDW: 13.9 % (ref 11.5–15.5)
WBC: 8.5 10*3/uL (ref 4.0–10.5)
nRBC: 0 % (ref 0.0–0.2)

## 2019-05-04 NOTE — Progress Notes (Signed)
PROGRESS NOTE    Luis Henderson  YQI:[id number] DOB: 1956/03/24 DOA: 05/03/2019 PCP: Ladell Pier, MD    Brief Narrative:  Luis Henderson is a 63 y.o. 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  Previous imaging studies revealed uncomplicated diverticulitis, possible chronic cholecystitis, and bilateral inguinal hernias.  Patient was to follow-up with general surgery in the outpatient setting for both issues once diverticulitis resolved.  After initially getting home he was fine the first night, However, overnight and into the next morning developed slightly right of midline abdominal pain.  Describes it as cramping and sharp in nature radiating upward.  He reports only being able to keep down 1 dose of his antibiotics prior to having nausea and vomiting.  He was unable to afford the antiemetic medications given.  Since that time pain continued and he was unable to keep any significant amount of food or liquids down for which he came back to the hospital.  Denies having any fever, dysuria, or diarrhea.  Complains of having to strain to try and have a bowel movement but is still able to pass some flatus.  In the emergency room he was hemodynamically stable.  Afebrile.  Complaining of lower abdominal pain bilateral.  Was admitted to the hospital for treatment of diverticulitis with intolerance to oral antibiotics.   Assessment & Plan:   Principal Problem:   Diverticulitis Active Problems:   Tobacco abuse   Hypertensive urgency   Nausea and vomiting   Polycythemia   Nausea & vomiting  Acute uncomplicated sigmoid diverticulitis: With intolerance to outpatient therapies and oral antibiotics.  Patient was not able to tolerate antibiotics by  mouth.  Agree with continuing IV antibiotics for next 24 hours before converting to p.o.  Continue IV fluids.  He has done clear liquids, will try to advance to full liquid diet and monitor.  Adequate pain medications, nausea medications. He has a ventriculoperitoneal shunt that does not look complicated.  Chronic cholecystitis with polyp: His pain is now more localized to the right lower and lateral quadrant.  This symptomatology could be related to chronic cholecystitis which is symptomatic.  Was seen by surgery 3 days ago and recommended interval cholecystectomy after improvement of diverticulitis.  Does not have any evidence of acute cholecystitis.  Will continue to treat for #1.  He will keep up his surgical appointment.  If worsening pain, will reconsult surgery in the hospital.  Hypertensive urgency: Blood pressures stabilized after resuming home medications.  Smoker: Counseled to quit.  On nicotine patch.  Patient continues to have significant symptoms.  Patient is admitted to the hospital in need of IV antibiotics and IV fluids, will need to monitor for tolerance to oral diet.  He has failed outpatient treatment with oral antibiotics.  He needs hospitalization.  Without hospitalization patient has high risk of deterioration of symptoms.  DVT prophylaxis: Lovenox subcu Code Status: Full code Family Communication: None Disposition Plan: Inpatient.  Anticipate home tomorrow if improvement.   Consultants:   None  Procedures:   None  Antimicrobials:   Cipro and Flagyl, 05/03/2019---   Subjective: Patient seen and examined.  No overnight events.  Mild abdominal pain mostly on the right lower quadrant and right upper quadrant.  Denies any nausea or vomiting.  No bowel movement since admission.  Objective:  Vitals:   05/03/19 1722 05/03/19 2056 05/04/19 0520 05/04/19 0856  BP: (!) 162/92 (!) 165/95 (!) 159/79 (!) 153/82  Pulse: 69 (!) 57 (!) 59 (!) 55  Resp: 20 18 16 16   Temp:  98.4 F (36.9 C) 98.9 F (37.2 C) 98.4 F (36.9 C) 98.7 F (37.1 C)  TempSrc: Oral Oral Oral Oral  SpO2: 100% 98% 98% 94%  Weight: 90.4 kg  89.4 kg   Height: 5\' 11"  (1.803 m)       Intake/Output Summary (Last 24 hours) at 05/04/2019 1028 Last data filed at 05/04/2019 0831 Gross per 24 hour  Intake 2100 ml  Output 400 ml  Net 1700 ml   Filed Weights   05/03/19 1136 05/03/19 1722 05/04/19 0520  Weight: 90.7 kg 90.4 kg 89.4 kg    Examination:  General exam: Appears calm and comfortable, slightly anxious. Respiratory system: Clear to auscultation. Respiratory effort normal. Cardiovascular system: S1 & S2 heard, RRR. No JVD, murmurs, rubs, gallops or clicks. No pedal edema. Gastrointestinal system: Abdomen is nondistended, soft and mild tenderness along the right upper quadrant.  No organomegaly or masses felt. Normal bowel sounds heard.  No rigidity or guarding.  No tenderness along the left lower quadrant. Central nervous system: Alert and oriented. No focal neurological deficits. Extremities: Symmetric 5 x 5 power. Skin: No rashes, lesions or ulcers Psychiatry: Judgement and insight appear normal. Mood & affect appropriate.     Data Reviewed: I have personally reviewed following labs and imaging studies  CBC: Recent Labs  Lab 04/30/19 0044 05/01/19 0442 05/03/19 1148 05/04/19 0221  WBC 11.1* 13.0* 9.7 8.5  NEUTROABS  --  9.1* 7.2  --   HGB 18.2* 18.4* 19.6* 16.9  HCT 55.0* 55.8* 58.3* 50.9  MCV 88.9 88.6 88.1 87.8  PLT 427* 335 321 347   Basic Metabolic Panel: Recent Labs  Lab 04/30/19 0044 05/01/19 0442 05/03/19 1148  NA 138 141 138  K 3.8 3.3* 3.6  CL 106 107 107  CO2 21* 20* 18*  GLUCOSE 119* 100* 142*  BUN 13 14 13   CREATININE 1.21 1.13 1.09  CALCIUM 9.3 8.9 9.3   GFR: Estimated Creatinine Clearance: 72.9 mL/min (by C-G formula based on SCr of 1.09 mg/dL). Liver Function Tests: Recent Labs  Lab 04/30/19 0044 05/01/19 0442  AST 19 15  ALT 17  17  ALKPHOS 82 80  BILITOT 0.9 1.2  PROT 6.7 6.3*  ALBUMIN 4.2 3.9   Recent Labs  Lab 04/30/19 0044  LIPASE 108*   No results for input(s): AMMONIA in the last 168 hours. Coagulation Profile: No results for input(s): INR, PROTIME in the last 168 hours. Cardiac Enzymes: No results for input(s): CKTOTAL, CKMB, CKMBINDEX, TROPONINI in the last 168 hours. BNP (last 3 results) No results for input(s): PROBNP in the last 8760 hours. HbA1C: No results for input(s): HGBA1C in the last 72 hours. CBG: No results for input(s): GLUCAP in the last 168 hours. Lipid Profile: No results for input(s): CHOL, HDL, LDLCALC, TRIG, CHOLHDL, LDLDIRECT in the last 72 hours. Thyroid Function Tests: No results for input(s): TSH, T4TOTAL, FREET4, T3FREE, THYROIDAB in the last 72 hours. Anemia Panel: No results for input(s): VITAMINB12, FOLATE, FERRITIN, TIBC, IRON, RETICCTPCT in the last 72 hours. Sepsis Labs: No results for input(s): PROCALCITON, LATICACIDVEN in the last 168 hours.  Recent Results (from the past 240 hour(s))  SARS Coronavirus 2 (CEPHEID - Performed in [place] hospital lab), Hosp Order     Status: None  Collection Time: 04/30/19  4:30 AM   Specimen: Nasopharyngeal Swab  Result Value Ref Range Status   SARS Coronavirus 2 NEGATIVE NEGATIVE Final    Comment: (NOTE) If result is NEGATIVE SARS-CoV-2 target nucleic acids are NOT DETECTED. The SARS-CoV-2 RNA is generally detectable in upper and lower  respiratory specimens during the acute phase of infection. The lowest  concentration of SARS-CoV-2 viral copies this assay can detect is 250  copies / mL. A negative result does not preclude SARS-CoV-2 infection  and should not be used as the sole basis for treatment or other  patient management decisions.  A negative result may occur with  improper specimen collection / handling, submission of specimen other  than nasopharyngeal swab, presence of viral mutation(s) within the  areas  targeted by this assay, and inadequate number of viral copies  (<250 copies / mL). A negative result must be combined with clinical  observations, patient history, and epidemiological information. If result is POSITIVE SARS-CoV-2 target nucleic acids are DETECTED. The SARS-CoV-2 RNA is generally detectable in upper and lower  respiratory specimens dur ing the acute phase of infection.  Positive  results are indicative of active infection with SARS-CoV-2.  Clinical  correlation with patient history and other diagnostic information is  necessary to determine patient infection status.  Positive results do  not rule out bacterial infection or co-infection with other viruses. If result is PRESUMPTIVE POSTIVE SARS-CoV-2 nucleic acids MAY BE PRESENT.   A presumptive positive result was obtained on the submitted specimen  and confirmed on repeat testing.  While 2019 novel coronavirus  (SARS-CoV-2) nucleic acids may be present in the submitted sample  additional confirmatory testing may be necessary for epidemiological  and / or clinical management purposes  to differentiate between  SARS-CoV-2 and other Sarbecovirus currently known to infect humans.  If clinically indicated additional testing with an alternate test  methodology [phone]) is advised. The SARS-CoV-2 RNA is generally  detectable in upper and lower respiratory sp ecimens during the acute  phase of infection. The expected result is Negative. Fact Sheet for Patients:  StrictlyIdeas.no Fact Sheet for Healthcare Providers: BankingDealers.co.za This test is not yet approved or cleared by the [place] FDA and has been authorized for detection and/or diagnosis of SARS-CoV-2 by FDA under an Emergency Use Authorization (EUA).  This EUA will remain in effect (meaning this test can be used) for the duration of the COVID-19 declaration under Section 564(b)(1) of the Act, 21 U.S.C. section  360bbb-3(b)(1), unless the authorization is terminated or revoked sooner. Performed at [facility], [place] [address] [place], [place] [address]   SARS Coronavirus 2 (CEPHEID - Performed in [place] hospital lab), Hosp Order     Status: None   Collection Time: 05/03/19 12:47 PM   Specimen: Nasopharyngeal Swab  Result Value Ref Range Status   SARS Coronavirus 2 NEGATIVE NEGATIVE Final    Comment: (NOTE) If result is NEGATIVE SARS-CoV-2 target nucleic acids are NOT DETECTED. The SARS-CoV-2 RNA is generally detectable in upper and lower  respiratory specimens during the acute phase of infection. The lowest  concentration of SARS-CoV-2 viral copies this assay can detect is 250  copies / mL. A negative result does not preclude SARS-CoV-2 infection  and should not be used as the sole basis for treatment or other  patient management decisions.  A negative result may occur with  improper specimen collection / handling, submission of specimen other  than nasopharyngeal swab, presence of viral  mutation(s) within the  areas targeted by this assay, and inadequate number of viral copies  (<250 copies / mL). A negative result must be combined with clinical  observations, patient history, and epidemiological information. If result is POSITIVE SARS-CoV-2 target nucleic acids are DETECTED. The SARS-CoV-2 RNA is generally detectable in upper and lower  respiratory specimens dur ing the acute phase of infection.  Positive  results are indicative of active infection with SARS-CoV-2.  Clinical  correlation with patient history and other diagnostic information is  necessary to determine patient infection status.  Positive results do  not rule out bacterial infection or co-infection with other viruses. If result is PRESUMPTIVE POSTIVE SARS-CoV-2 nucleic acids MAY BE PRESENT.   A presumptive positive result was obtained on the submitted specimen  and confirmed on repeat testing.  While 2019 novel  coronavirus  (SARS-CoV-2) nucleic acids may be present in the submitted sample  additional confirmatory testing may be necessary for epidemiological  and / or clinical management purposes  to differentiate between  SARS-CoV-2 and other Sarbecovirus currently known to infect humans.  If clinically indicated additional testing with an alternate test  methodology [phone]) is advised. The SARS-CoV-2 RNA is generally  detectable in upper and lower respiratory sp ecimens during the acute  phase of infection. The expected result is Negative. Fact Sheet for Patients:  StrictlyIdeas.no Fact Sheet for Healthcare Providers: BankingDealers.co.za This test is not yet approved or cleared by the [place] FDA and has been authorized for detection and/or diagnosis of SARS-CoV-2 by FDA under an Emergency Use Authorization (EUA).  This EUA will remain in effect (meaning this test can be used) for the duration of the COVID-19 declaration under Section 564(b)(1) of the Act, 21 U.S.C. section 360bbb-3(b)(1), unless the authorization is terminated or revoked sooner. Performed at [facility], [place] [address], [place], [place] [address]          Radiology Studies: Dg Abdomen Acute W/chest  Result Date: 05/03/2019 CLINICAL DATA:  Abdominal pain and vomiting since last night. Hypertension. Dizziness. EXAM: DG ABDOMEN ACUTE W/ 1V CHEST COMPARISON:  KUB 05/31/2017 and chest x-ray 05/31/2017 FINDINGS: Lungs are adequately inflated and otherwise clear. Right-sided ventriculostomy catheter unchanged. Cardiomediastinal silhouette and remainder of the thorax is unchanged. Abdominopelvic images demonstrate a nonobstructive bowel gas pattern. No free peritoneal air. Ventriculostomy catheter is coiled over the central abdomen without significant change. Rounded 10 cm mass over the left abdomen compatible with known large left renal cyst. No 9 mm right renal  stone. Remainder the exam is unchanged. IMPRESSION: No acute cardiopulmonary disease.  Nonobstructive bowel gas pattern. Right-sided nephrolithiasis.  Known large left renal cyst. Ventriculostomy catheter unchanged. Electronically Signed   By: Marin Olp M.D.   On: 05/03/2019 14:28        Scheduled Meds: . acidophilus  2 capsule Oral Daily  . amLODipine  10 mg Oral Daily  . aspirin EC  81 mg Oral Daily  . enoxaparin (LOVENOX) injection  40 mg Subcutaneous Q24H  . sodium chloride flush  3 mL Intravenous Q12H   Continuous Infusions: . ciprofloxacin 400 mg (05/04/19 0842)  . metronidazole 500 mg (05/04/19 0946)     LOS: 0 days    Time spent: 25 minutes     Barb Merino, MD [facility] Pager [phone]  If 7PM-7AM, please contact night-coverage [email or web address] Password [facility] 05/04/2019, 10:28 AM

## 2019-05-05 DIAGNOSIS — R1011 Right upper quadrant pain: Secondary | ICD-10-CM

## 2019-05-05 LAB — SURGICAL PCR SCREEN
MRSA, PCR: NEGATIVE
Staphylococcus aureus: NEGATIVE

## 2019-05-05 MED ORDER — HYDROMORPHONE HCL 1 MG/ML IJ SOLN
1.0000 mg | INTRAMUSCULAR | Status: DC | PRN
  Administered 2019-05-05 – 2019-05-06 (×4): 1 mg via INTRAVENOUS
  Filled 2019-05-05 (×4): qty 1

## 2019-05-05 NOTE — Progress Notes (Signed)
[facility]/Trauma Progress Note     HPI  63 yo white male with a hx of hydrocephalus and subarachnoid cysts s/p VP shunt placed in 2017, HTN, HLD, depression who was admitted on 07/15 for abdominal pain, N and V. He was found to have sigmoid diverticulitis and imaging concerning for chronic cholecystitis. He has been having RUQ abdominal pain with N/V after eating for he states 30 years. Per EMR, this has been evaluated several years ago with gallbladder wall thickening and a HIDA scan that reveals no evidence of cholecystitis or stone, but decrease in EF.  The patient has never followed up. Pt was discharged on 07/16 with PO abx and we recommended follow up for lap chole after resolution of diverticulitis. Pt returned to the ED on 07/18 with complaints of N, V and abdominal pain. He states severe RUQ pain after eating a can of beef stew. He wants his gallbladder removed. He states no more lower abdominal pain and he is not even sure he had diverticulitis. He is insistent that his gallbladder needs to be removed.  Assessment/Plan  Diverticulitis - continue abx, clinically improved as pt is nontender on exam Chronic cholecystitis - would prefer to avoid lap chole while pt is healing from diverticulitis and this was discussed with the pt. Would recommend output follow up for lap chole in 3-4 weeks but will discuss with MD if he would be okay with lap chole this admission.   FEN: CLD VTE: SCD's, lovenox ID: Cipro & Flagyl 07/18>> Foley: none Follow up: TBD     LOS: 1 day    Objective: Vital signs in last 24 hours: Temp:  [98.6 F (37 C)-99.1 F (37.3 C)] 98.8 F (37.1 C) (07/20 0953) Pulse Rate:  [55-64] 61 (07/20 0953) Resp:  [16-18] 18 (07/20 0953) BP: (136-151)/(75-84) 136/84 (07/20 0953) SpO2:  [95 %-96 %] 95 % (07/20 0953) Weight:  [88.7 kg] 88.7 kg (07/19 2300) Last BM Date: 05/05/19  Intake/Output from previous day: 07/19 0701 - 07/20 0700 In: 1760  [P.O.:1160; IV Piggyback:600] Out: -  Intake/Output this shift: Total I/O In: -  Out: 400 [Urine:400]  PE: Gen:  Alert, NAD, well appearing Pulm:  Rate and effort normal Abd: soft, tender in RUQ with guarding, no Murphy's sign.  no TTP of lower abdomen. ND, +BS, He has small reducible umbilical hernia. Skin: warm and dry with no rashes noted Psych: A&Ox3 with an appropriate affect.   Anti-infectives: Anti-infectives (From admission, onward)   Start     Dose/Rate Route Frequency Ordered Stop   05/03/19 2000  ciprofloxacin (CIPRO) IVPB 400 mg     400 mg 200 mL/hr over 60 Minutes Intravenous Every 12 hours 05/03/19 1605     05/03/19 1800  metroNIDAZOLE (FLAGYL) IVPB 500 mg     500 mg 100 mL/hr over 60 Minutes Intravenous Every 8 hours 05/03/19 1605        Lab Results:  Recent Labs    05/03/19 1148 05/04/19 0221  WBC 9.7 8.5  HGB 19.6* 16.9  HCT 58.3* 50.9  PLT 321 289   BMET Recent Labs    05/03/19 1148  NA 138  K 3.6  CL 107  CO2 18*  GLUCOSE 142*  BUN 13  CREATININE 1.09  CALCIUM 9.3   PT/INR No results for input(s): LABPROT, INR in the last 72 hours. CMP     Component Value Date/Time   NA 138 05/03/2019 1148   K 3.6 05/03/2019 1148   CL  107 05/03/2019 1148   CO2 18 (L) 05/03/2019 1148   GLUCOSE 142 (H) 05/03/2019 1148   BUN 13 05/03/2019 1148   CREATININE 1.09 05/03/2019 1148   CREATININE 0.85 11/16/2017 1209   CREATININE 1.17 04/14/2016 1002   CALCIUM 9.3 05/03/2019 1148   PROT 6.3 (L) 05/01/2019 0442   ALBUMIN 3.9 05/01/2019 0442   AST 15 05/01/2019 0442   AST 16 11/16/2017 1209   ALT 17 05/01/2019 0442   ALT 14 11/16/2017 1209   ALKPHOS 80 05/01/2019 0442   BILITOT 1.2 05/01/2019 0442   BILITOT 0.5 11/16/2017 1209   GFRNONAA >60 05/03/2019 1148   GFRNONAA >60 11/16/2017 1209   GFRAA >60 05/03/2019 1148   GFRAA >60 11/16/2017 1209   Lipase     Component Value Date/Time   LIPASE 108 (H) 04/30/2019 0044    Studies/Results: Dg  Abdomen Acute W/chest  Result Date: 05/03/2019 CLINICAL DATA:  Abdominal pain and vomiting since last night. Hypertension. Dizziness. EXAM: DG ABDOMEN ACUTE W/ 1V CHEST COMPARISON:  KUB 05/31/2017 and chest x-ray 05/31/2017 FINDINGS: Lungs are adequately inflated and otherwise clear. Right-sided ventriculostomy catheter unchanged. Cardiomediastinal silhouette and remainder of the thorax is unchanged. Abdominopelvic images demonstrate a nonobstructive bowel gas pattern. No free peritoneal air. Ventriculostomy catheter is coiled over the central abdomen without significant change. Rounded 10 cm mass over the left abdomen compatible with known large left renal cyst. No 9 mm right renal stone. Remainder the exam is unchanged. IMPRESSION: No acute cardiopulmonary disease.  Nonobstructive bowel gas pattern. Right-sided nephrolithiasis.  Known large left renal cyst. Ventriculostomy catheter unchanged. Electronically Signed   By: Marin Olp M.D.   On: 05/03/2019 14:28      Kalman Drape , [facility] 05/05/2019, 12:49 PM  Pager: [phone] Mon-Wed, Friday 7:00am-4:30pm Thurs 7am-11:30am  Consults: [phone]

## 2019-05-05 NOTE — Progress Notes (Signed)
Initial Nutrition Assessment  DOCUMENTATION CODES:   Not applicable  INTERVENTION:    Boost Breeze po TID, each supplement provides 250 kcal and 9 grams of protein once diet advanced s/p lap chole  MVI daily   NUTRITION DIAGNOSIS:   Inadequate oral intake related to nausea, vomiting as evidenced by per patient/family report.  GOAL:   Patient will meet greater than or equal to 90% of their needs  MONITOR:   PO intake, Supplement acceptance, Diet advancement, Weight trends, Labs, I & O's  REASON FOR ASSESSMENT:   Malnutrition Screening Tool    ASSESSMENT:   Patient with PMH significant for HTN, HLD, polycythemia, nephrolithiasis, and hydrocephalus/subarachnoid cysts s/p VP shunt. Presents this admission with unresolved sigmoid diverticulitis.   RD working remotely.  Plan for lap chole tomorrow.   Spoke with pt via phone. Endorses having a loss in appetite over the last week due to pain upon eating. States during this time he consumed sips/bites of soup, jello, and pudding. Prior to this he was sent meals from [facility] (church groups?) that consisted of cereal for breakfast, yogurt with fruit for lunch, and meat/vegetable/grain for dinner. Pt has been homeless since March and has been staying in a [facility]. Discussed the importance of protein intake to promote post-op healing. Will provide supplementation to maximize kcal and protein.   Pt endorses a UBW of 204 lb and a recent unintentional wt loss of 6 lbs in the last couple of weeks. Records indicate pt weighed 197 lb last admission and 195 lb this admission.    Medications: abx Labs: CBG 100-142  Diet Order:   Diet Order            Diet NPO time specified  Diet effective midnight        Diet clear liquid Room service appropriate? Yes; Fluid consistency: Thin  Diet effective now              EDUCATION NEEDS:   Education needs have been addressed  Skin:  Skin Assessment: Reviewed RN Assessment  Last BM:   7/20  Height:   Ht Readings from Last 1 Encounters:  05/03/19 5\' 11"  (1.803 m)    Weight:   Wt Readings from Last 1 Encounters:  05/04/19 88.7 kg    Ideal Body Weight:  78.2 kg  BMI:  Body mass index is 27.27 kg/m.  Estimated Nutritional Needs:   Kcal:  2200-2400 kcal  Protein:  110-130 grams  Fluid:  >/= 2.2 L/day   Mariana Single RD, LDN Clinical Nutrition Pager # - [phone]

## 2019-05-05 NOTE — Progress Notes (Signed)
PROGRESS NOTE    Luis Henderson  LZJ:[id number] DOB: Sep 20, 1956 DOA: 05/03/2019 PCP: Ladell Pier, MD    Brief Narrative:  Luis Henderson is a 63 y.o. 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  Previous imaging studies revealed uncomplicated diverticulitis, possible chronic cholecystitis, and bilateral inguinal hernias.  Patient was to follow-up with general surgery in the outpatient setting for both issues once diverticulitis resolved.    Assessment & Plan:   Principal Problem:   Diverticulitis Active Problems:   Tobacco abuse   Hypertensive urgency   Nausea and vomiting   Polycythemia   Nausea & vomiting   Diverticulitis large intestine  Acute uncomplicated sigmoid diverticulitis:  - intolerance to outpatient therapies and oral antibiotics.   - continuing IV antibiotics -continue clear diet (move back from full liquids)  RUQ pain with ?Chronic cholecystitis with polyp: -pain is now more localized to the right upper quadrant.  T -Was seen by surgery 3 during prior hospitalization and recommended interval cholecystectomy after improvement of diverticulitis.  -labs in AM, including lipase -reconsult general surgery  Hypertensive urgency: Blood pressures stabilized after resuming home medications.  Smoker: Counseled to quit.  On nicotine patch.    DVT prophylaxis: Lovenox subcu Code Status: Full code Family Communication: None Disposition Plan:    Consultants:   GS      Subjective: RUQ pain after eating grits --"I keep telling you all it is my gall bladder"  Objective: Vitals:   05/04/19 1814 05/04/19 2300 05/05/19 0503 05/05/19 0953  BP: (!) 145/75  (!) 151/81 136/84  Pulse: 64  (!) 55 61  Resp: 16   18 18   Temp: 98.6 F (37 C)  99.1 F (37.3 C) 98.8 F (37.1 C)  TempSrc: Oral  Oral Oral  SpO2: 96%  96% 95%  Weight:  88.7 kg    Height:        Intake/Output Summary (Last 24 hours) at 05/05/2019 1039 Last data filed at 05/05/2019 0734 Gross per 24 hour  Intake 860 ml  Output 400 ml  Net 460 ml   Filed Weights   05/03/19 1722 05/04/19 0520 05/04/19 2300  Weight: 90.4 kg 89.4 kg 88.7 kg    Examination:  In bed, NAD rrr +BS, pain in RUQ No LE edema A+Ox3    Data Reviewed: I have personally reviewed following labs and imaging studies  CBC: Recent Labs  Lab 04/30/19 0044 05/01/19 0442 05/03/19 1148 05/04/19 0221  WBC 11.1* 13.0* 9.7 8.5  NEUTROABS  --  9.1* 7.2  --   HGB 18.2* 18.4* 19.6* 16.9  HCT 55.0* 55.8* 58.3* 50.9  MCV 88.9 88.6 88.1 87.8  PLT 427* 335 321 024   Basic Metabolic Panel: Recent Labs  Lab 04/30/19 0044 05/01/19 0442 05/03/19 1148  NA 138 141 138  K 3.8 3.3* 3.6  CL 106 107 107  CO2 21* 20* 18*  GLUCOSE 119* 100* 142*  BUN 13 14 13   CREATININE 1.21 1.13 1.09  CALCIUM 9.3 8.9 9.3   GFR: Estimated Creatinine Clearance: 72.9 mL/min (by C-G formula based on SCr of 1.09 mg/dL). Liver Function Tests: Recent Labs  Lab 04/30/19 0044 05/01/19 0442  AST 19 15  ALT 17 17  ALKPHOS 82 80  BILITOT 0.9 1.2  PROT 6.7 6.3*  ALBUMIN 4.2 3.9  Recent Labs  Lab 04/30/19 0044  LIPASE 108*   No results for input(s): AMMONIA in the last 168 hours. Coagulation Profile: No results for input(s): INR, PROTIME in the last 168 hours. Cardiac Enzymes: No results for input(s): CKTOTAL, CKMB, CKMBINDEX, TROPONINI in the last 168 hours. BNP (last 3 results) No results for input(s): PROBNP in the last 8760 hours. HbA1C: No results for input(s): HGBA1C in the last 72 hours. CBG: No results for input(s): GLUCAP in the last 168 hours. Lipid Profile: No results for input(s): CHOL, HDL, LDLCALC, TRIG, CHOLHDL, LDLDIRECT in the last 72 hours.  Thyroid Function Tests: No results for input(s): TSH, T4TOTAL, FREET4, T3FREE, THYROIDAB in the last 72 hours. Anemia Panel: No results for input(s): VITAMINB12, FOLATE, FERRITIN, TIBC, IRON, RETICCTPCT in the last 72 hours. Sepsis Labs: No results for input(s): PROCALCITON, LATICACIDVEN in the last 168 hours.  Recent Results (from the past 240 hour(s))  SARS Coronavirus 2 (CEPHEID - Performed in [place] hospital lab), Hosp Order     Status: None   Collection Time: 04/30/19  4:30 AM   Specimen: Nasopharyngeal Swab  Result Value Ref Range Status   SARS Coronavirus 2 NEGATIVE NEGATIVE Final    Comment: (NOTE) If result is NEGATIVE SARS-CoV-2 target nucleic acids are NOT DETECTED. The SARS-CoV-2 RNA is generally detectable in upper and lower  respiratory specimens during the acute phase of infection. The lowest  concentration of SARS-CoV-2 viral copies this assay can detect is 250  copies / mL. A negative result does not preclude SARS-CoV-2 infection  and should not be used as the sole basis for treatment or other  patient management decisions.  A negative result may occur with  improper specimen collection / handling, submission of specimen other  than nasopharyngeal swab, presence of viral mutation(s) within the  areas targeted by this assay, and inadequate number of viral copies  (<250 copies / mL). A negative result must be combined with clinical  observations, patient history, and epidemiological information. If result is POSITIVE SARS-CoV-2 target nucleic acids are DETECTED. The SARS-CoV-2 RNA is generally detectable in upper and lower  respiratory specimens dur ing the acute phase of infection.  Positive  results are indicative of active infection with SARS-CoV-2.  Clinical  correlation with patient history and other diagnostic information is  necessary to determine patient infection status.  Positive results do  not rule out bacterial infection or co-infection with other  viruses. If result is PRESUMPTIVE POSTIVE SARS-CoV-2 nucleic acids MAY BE PRESENT.   A presumptive positive result was obtained on the submitted specimen  and confirmed on repeat testing.  While 2019 novel coronavirus  (SARS-CoV-2) nucleic acids may be present in the submitted sample  additional confirmatory testing may be necessary for epidemiological  and / or clinical management purposes  to differentiate between  SARS-CoV-2 and other Sarbecovirus currently known to infect humans.  If clinically indicated additional testing with an alternate test  methodology [phone]) is advised. The SARS-CoV-2 RNA is generally  detectable in upper and lower respiratory sp ecimens during the acute  phase of infection. The expected result is Negative. Fact Sheet for Patients:  StrictlyIdeas.no Fact Sheet for Healthcare Providers: BankingDealers.co.za This test is not yet approved or cleared by the [place] FDA and has been authorized for detection and/or diagnosis of SARS-CoV-2 by FDA under an Emergency Use Authorization (EUA).  This EUA will remain in effect (meaning this test can be used) for the duration of the COVID-19 declaration under Section  564(b)(1) of the Act, 21 U.S.C. section 360bbb-3(b)(1), unless the authorization is terminated or revoked sooner. Performed at [facility], [place] [address] [place], [place] [address]   SARS Coronavirus 2 (CEPHEID - Performed in [place] hospital lab), Hosp Order     Status: None   Collection Time: 05/03/19 12:47 PM   Specimen: Nasopharyngeal Swab  Result Value Ref Range Status   SARS Coronavirus 2 NEGATIVE NEGATIVE Final    Comment: (NOTE) If result is NEGATIVE SARS-CoV-2 target nucleic acids are NOT DETECTED. The SARS-CoV-2 RNA is generally detectable in upper and lower  respiratory specimens during the acute phase of infection. The lowest  concentration of SARS-CoV-2 viral copies this  assay can detect is 250  copies / mL. A negative result does not preclude SARS-CoV-2 infection  and should not be used as the sole basis for treatment or other  patient management decisions.  A negative result may occur with  improper specimen collection / handling, submission of specimen other  than nasopharyngeal swab, presence of viral mutation(s) within the  areas targeted by this assay, and inadequate number of viral copies  (<250 copies / mL). A negative result must be combined with clinical  observations, patient history, and epidemiological information. If result is POSITIVE SARS-CoV-2 target nucleic acids are DETECTED. The SARS-CoV-2 RNA is generally detectable in upper and lower  respiratory specimens dur ing the acute phase of infection.  Positive  results are indicative of active infection with SARS-CoV-2.  Clinical  correlation with patient history and other diagnostic information is  necessary to determine patient infection status.  Positive results do  not rule out bacterial infection or co-infection with other viruses. If result is PRESUMPTIVE POSTIVE SARS-CoV-2 nucleic acids MAY BE PRESENT.   A presumptive positive result was obtained on the submitted specimen  and confirmed on repeat testing.  While 2019 novel coronavirus  (SARS-CoV-2) nucleic acids may be present in the submitted sample  additional confirmatory testing may be necessary for epidemiological  and / or clinical management purposes  to differentiate between  SARS-CoV-2 and other Sarbecovirus currently known to infect humans.  If clinically indicated additional testing with an alternate test  methodology [phone]) is advised. The SARS-CoV-2 RNA is generally  detectable in upper and lower respiratory sp ecimens during the acute  phase of infection. The expected result is Negative. Fact Sheet for Patients:  StrictlyIdeas.no Fact Sheet for Healthcare Providers:  BankingDealers.co.za This test is not yet approved or cleared by the [place] FDA and has been authorized for detection and/or diagnosis of SARS-CoV-2 by FDA under an Emergency Use Authorization (EUA).  This EUA will remain in effect (meaning this test can be used) for the duration of the COVID-19 declaration under Section 564(b)(1) of the Act, 21 U.S.C. section 360bbb-3(b)(1), unless the authorization is terminated or revoked sooner. Performed at [facility], [place] [address] [place], [place] [address]          Radiology Studies: Dg Abdomen Acute W/chest  Result Date: 05/03/2019 CLINICAL DATA:  Abdominal pain and vomiting since last night. Hypertension. Dizziness. EXAM: DG ABDOMEN ACUTE W/ 1V CHEST COMPARISON:  KUB 05/31/2017 and chest x-ray 05/31/2017 FINDINGS: Lungs are adequately inflated and otherwise clear. Right-sided ventriculostomy catheter unchanged. Cardiomediastinal silhouette and remainder of the thorax is unchanged. Abdominopelvic images demonstrate a nonobstructive bowel gas pattern. No free peritoneal air. Ventriculostomy catheter is coiled over the central abdomen without significant change. Rounded 10 cm mass over the left abdomen compatible  with known large left renal cyst. No 9 mm right renal stone. Remainder the exam is unchanged. IMPRESSION: No acute cardiopulmonary disease.  Nonobstructive bowel gas pattern. Right-sided nephrolithiasis.  Known large left renal cyst. Ventriculostomy catheter unchanged. Electronically Signed   By: Marin Olp M.D.   On: 05/03/2019 14:28        Scheduled Meds: . acidophilus  2 capsule Oral Daily  . amLODipine  10 mg Oral Daily  . aspirin EC  81 mg Oral Daily  . enoxaparin (LOVENOX) injection  40 mg Subcutaneous Q24H  . sodium chloride flush  3 mL Intravenous Q12H   Continuous Infusions: . ciprofloxacin 400 mg (05/05/19 0751)  . metronidazole 500 mg (05/05/19 0944)     LOS: 1 day    Time  spent: 25 minutes     Geradine Girt, DO [facility]   If 7PM-7AM, please contact night-coverage [email or web address] Password [facility] 05/05/2019, 10:39 AM

## 2019-05-05 NOTE — Plan of Care (Signed)
  Problem: Clinical Measurements: Goal: Ability to maintain clinical measurements within normal limits will improve Outcome: Progressing   

## 2019-05-06 ENCOUNTER — Inpatient Hospital Stay (HOSPITAL_COMMUNITY): Payer: Medicare Other | Admitting: Certified Registered"

## 2019-05-06 ENCOUNTER — Encounter (HOSPITAL_COMMUNITY)
Admission: EM | Disposition: A | Discharge status: Home / Self Care | Payer: Self-pay | Source: Home / Self Care | Attending: Internal Medicine

## 2019-05-06 ENCOUNTER — Inpatient Hospital Stay (HOSPITAL_COMMUNITY): Payer: Medicare Other

## 2019-05-06 ENCOUNTER — Encounter (HOSPITAL_COMMUNITY): Payer: Self-pay | Admitting: *Deleted

## 2019-05-06 HISTORY — PX: CHOLECYSTECTOMY: SHX55

## 2019-05-06 LAB — CBC
HCT: 54.1 % — ABNORMAL HIGH (ref 39.0–52.0)
Hemoglobin: 18 g/dL — ABNORMAL HIGH (ref 13.0–17.0)
MCH: 29.4 pg (ref 26.0–34.0)
MCHC: 33.3 g/dL (ref 30.0–36.0)
MCV: 88.3 fL (ref 80.0–100.0)
Platelets: 300 10*3/uL (ref 150–400)
RBC: 6.13 MIL/uL — ABNORMAL HIGH (ref 4.22–5.81)
RDW: 13.6 % (ref 11.5–15.5)
WBC: 8.8 10*3/uL (ref 4.0–10.5)
nRBC: 0 % (ref 0.0–0.2)

## 2019-05-06 LAB — BASIC METABOLIC PANEL
Anion gap: 10 (ref 5–15)
BUN: 10 mg/dL (ref 8–23)
CO2: 23 mmol/L (ref 22–32)
Calcium: 9 mg/dL (ref 8.9–10.3)
Chloride: 106 mmol/L (ref 98–111)
Creatinine, Ser: 1.02 mg/dL (ref 0.61–1.24)
GFR calc Af Amer: >60 mL/min (ref >60)
GFR calc non Af Amer: >60 mL/min (ref >60)
Glucose, Bld: 101 mg/dL — ABNORMAL HIGH (ref 70–99)
Potassium: 3.9 mmol/L (ref 3.5–5.1)
Sodium: 139 mmol/L (ref 135–145)

## 2019-05-06 LAB — LIPASE, BLOOD: Lipase: 51 U/L (ref 11–51)

## 2019-05-06 SURGERY — LAPAROSCOPIC CHOLECYSTECTOMY WITH INTRAOPERATIVE CHOLANGIOGRAM
Anesthesia: General | Site: Abdomen

## 2019-05-06 MED ORDER — DEXAMETHASONE SODIUM PHOSPHATE 10 MG/ML IJ SOLN
INTRAMUSCULAR | Status: AC
  Filled 2019-05-06: qty 3

## 2019-05-06 MED ORDER — EPHEDRINE 5 MG/ML INJ
INTRAVENOUS | Status: AC
  Filled 2019-05-06: qty 20

## 2019-05-06 MED ORDER — ONDANSETRON HCL 4 MG/2ML IJ SOLN
INTRAMUSCULAR | Status: DC | PRN
  Administered 2019-05-06: 4 mg via INTRAVENOUS

## 2019-05-06 MED ORDER — ONDANSETRON HCL 4 MG/2ML IJ SOLN
INTRAMUSCULAR | Status: AC
  Filled 2019-05-06: qty 4

## 2019-05-06 MED ORDER — MIDAZOLAM HCL 5 MG/5ML IJ SOLN
INTRAMUSCULAR | Status: DC | PRN
  Administered 2019-05-06: 2 mg via INTRAVENOUS

## 2019-05-06 MED ORDER — MIDAZOLAM HCL 2 MG/2ML IJ SOLN: 0.5000 mg | Freq: Once | INTRAMUSCULAR | Status: DC | PRN

## 2019-05-06 MED ORDER — HYDROMORPHONE HCL 1 MG/ML IJ SOLN
1.0000 mg | INTRAMUSCULAR | Status: DC | PRN
  Administered 2019-05-06 – 2019-05-07 (×5): 1 mg via INTRAVENOUS
  Filled 2019-05-06 (×6): qty 1

## 2019-05-06 MED ORDER — BUPIVACAINE-EPINEPHRINE 0.25% -1:200000 IJ SOLN
INTRAMUSCULAR | Status: DC | PRN
  Administered 2019-05-06: 10 mL

## 2019-05-06 MED ORDER — MEPERIDINE HCL 25 MG/ML IJ SOLN: 6.2500 mg | INTRAMUSCULAR | Status: DC | PRN

## 2019-05-06 MED ORDER — STERILE WATER FOR IRRIGATION IR SOLN
Status: DC | PRN
  Administered 2019-05-06: 1000 mL

## 2019-05-06 MED ORDER — SUGAMMADEX SODIUM 200 MG/2ML IV SOLN
INTRAVENOUS | Status: DC | PRN
  Administered 2019-05-06: 200 mg via INTRAVENOUS

## 2019-05-06 MED ORDER — OXYCODONE HCL 5 MG PO TABS
5.0000 mg | ORAL_TABLET | Freq: Four times a day (QID) | ORAL | Status: DC | PRN
  Administered 2019-05-06 (×2): 5 mg via ORAL
  Filled 2019-05-06 (×2): qty 1

## 2019-05-06 MED ORDER — FENTANYL CITRATE (PF) 250 MCG/5ML IJ SOLN
INTRAMUSCULAR | Status: AC
  Filled 2019-05-06: qty 5

## 2019-05-06 MED ORDER — LIDOCAINE 2% (20 MG/ML) 5 ML SYRINGE
INTRAMUSCULAR | Status: AC
  Filled 2019-05-06: qty 15

## 2019-05-06 MED ORDER — LACTATED RINGERS IV SOLN
INTRAVENOUS | Status: DC
  Administered 2019-05-06 (×2): via INTRAVENOUS

## 2019-05-06 MED ORDER — PHENYLEPHRINE 40 MCG/ML (10ML) SYRINGE FOR IV PUSH (FOR BLOOD PRESSURE SUPPORT)
PREFILLED_SYRINGE | INTRAVENOUS | Status: AC
  Filled 2019-05-06: qty 20

## 2019-05-06 MED ORDER — FENTANYL CITRATE (PF) 100 MCG/2ML IJ SOLN
25.0000 ug | INTRAMUSCULAR | Status: DC | PRN
  Administered 2019-05-06 (×2): 50 ug via INTRAVENOUS

## 2019-05-06 MED ORDER — ONDANSETRON HCL 4 MG/2ML IJ SOLN
INTRAMUSCULAR | Status: AC
  Filled 2019-05-06: qty 2

## 2019-05-06 MED ORDER — DEXMEDETOMIDINE HCL 200 MCG/2ML IV SOLN
INTRAVENOUS | Status: DC | PRN
  Administered 2019-05-06: 40 ug via INTRAVENOUS
  Administered 2019-05-06: 16 ug via INTRAVENOUS

## 2019-05-06 MED ORDER — ROCURONIUM BROMIDE 10 MG/ML (PF) SYRINGE
PREFILLED_SYRINGE | INTRAVENOUS | Status: AC
  Filled 2019-05-06: qty 30

## 2019-05-06 MED ORDER — SUCCINYLCHOLINE CHLORIDE 200 MG/10ML IV SOSY
PREFILLED_SYRINGE | INTRAVENOUS | Status: DC | PRN
  Administered 2019-05-06: 120 mg via INTRAVENOUS

## 2019-05-06 MED ORDER — MIDAZOLAM HCL 2 MG/2ML IJ SOLN
INTRAMUSCULAR | Status: AC
  Filled 2019-05-06: qty 2

## 2019-05-06 MED ORDER — SODIUM CHLORIDE 0.9 % IR SOLN
Status: DC | PRN
  Administered 2019-05-06: 1000 mL

## 2019-05-06 MED ORDER — BUPIVACAINE-EPINEPHRINE (PF) 0.25% -1:200000 IJ SOLN
INTRAMUSCULAR | Status: AC
  Filled 2019-05-06: qty 30

## 2019-05-06 MED ORDER — SODIUM CHLORIDE 0.9 % IV SOLN
INTRAVENOUS | Status: DC | PRN
  Administered 2019-05-06: 15:00:00 15 mL

## 2019-05-06 MED ORDER — FENTANYL CITRATE (PF) 250 MCG/5ML IJ SOLN
INTRAMUSCULAR | Status: DC | PRN
  Administered 2019-05-06 (×2): 100 ug via INTRAVENOUS
  Administered 2019-05-06: 50 ug via INTRAVENOUS

## 2019-05-06 MED ORDER — ROCURONIUM BROMIDE 10 MG/ML (PF) SYRINGE
PREFILLED_SYRINGE | INTRAVENOUS | Status: DC | PRN
  Administered 2019-05-06: 50 mg via INTRAVENOUS

## 2019-05-06 MED ORDER — 0.9 % SODIUM CHLORIDE (POUR BTL) OPTIME
TOPICAL | Status: DC | PRN
  Administered 2019-05-06: 15:00:00 1000 mL

## 2019-05-06 MED ORDER — PROMETHAZINE HCL 25 MG/ML IJ SOLN: 6.2500 mg | INTRAMUSCULAR | Status: DC | PRN

## 2019-05-06 MED ORDER — PROPOFOL 10 MG/ML IV BOLUS
INTRAVENOUS | Status: DC | PRN
  Administered 2019-05-06: 150 mg via INTRAVENOUS
  Administered 2019-05-06: 50 mg via INTRAVENOUS

## 2019-05-06 MED ORDER — FENTANYL CITRATE (PF) 100 MCG/2ML IJ SOLN
INTRAMUSCULAR | Status: AC
  Administered 2019-05-06: 16:00:00 50 ug via INTRAVENOUS
  Filled 2019-05-06: qty 2

## 2019-05-06 MED ORDER — SODIUM CHLORIDE 0.9 % IV SOLN
INTRAVENOUS | Status: DC
  Administered 2019-05-06 (×2): via INTRAVENOUS

## 2019-05-06 MED ORDER — LIDOCAINE 2% (20 MG/ML) 5 ML SYRINGE
INTRAMUSCULAR | Status: DC | PRN
  Administered 2019-05-06: 40 mg via INTRAVENOUS
  Administered 2019-05-06: 60 mg via INTRAVENOUS

## 2019-05-06 MED ORDER — DEXAMETHASONE SODIUM PHOSPHATE 10 MG/ML IJ SOLN
INTRAMUSCULAR | Status: DC | PRN
  Administered 2019-05-06: 10 mg via INTRAVENOUS

## 2019-05-06 SURGICAL SUPPLY — 51 items
APL PRP STRL LF DISP 70% ISPRP (MISCELLANEOUS) ×1
APL SKNCLS STERI-STRIP NONHPOA (GAUZE/BANDAGES/DRESSINGS) ×1
APPLIER CLIP ROT 10 11.4 M/L (STAPLE) ×3
APR CLP MED LRG 11.4X10 (STAPLE) ×1
BAG SPEC RTRVL LRG 6X4 10 (ENDOMECHANICALS) ×1
BENZOIN TINCTURE PRP APPL 2/3 (GAUZE/BANDAGES/DRESSINGS) ×3 IMPLANT
BLADE CLIPPER SURG (BLADE) ×2 IMPLANT
CANISTER SUCT 3000ML PPV (MISCELLANEOUS) ×3 IMPLANT
CHLORAPREP W/TINT 26 (MISCELLANEOUS) ×3 IMPLANT
CLIP APPLIE ROT 10 11.4 M/L (STAPLE) ×1 IMPLANT
CLOSURE WOUND 1/2 X4 (GAUZE/BANDAGES/DRESSINGS) ×1
COVER MAYO STAND STRL (DRAPES) ×3 IMPLANT
COVER SURGICAL LIGHT HANDLE (MISCELLANEOUS) ×3 IMPLANT
COVER WAND RF STERILE (DRAPES) ×1 IMPLANT
DRAPE C-ARM 42X72 X-RAY (DRAPES) ×3 IMPLANT
DRSG TEGADERM 2-3/8X2-3/4 SM (GAUZE/BANDAGES/DRESSINGS) ×9 IMPLANT
DRSG TEGADERM 4X4.75 (GAUZE/BANDAGES/DRESSINGS) ×3 IMPLANT
ELECT REM PT RETURN 9FT ADLT (ELECTROSURGICAL) ×3
ELECTRODE REM PT RTRN 9FT ADLT (ELECTROSURGICAL) ×1 IMPLANT
GAUZE SPONGE 2X2 8PLY STRL LF (GAUZE/BANDAGES/DRESSINGS) ×1 IMPLANT
GLOVE BIO SURGEON STRL SZ7 (GLOVE) ×3 IMPLANT
GLOVE BIOGEL PI IND STRL 6.5 (GLOVE) IMPLANT
GLOVE BIOGEL PI IND STRL 7.5 (GLOVE) ×1 IMPLANT
GLOVE BIOGEL PI INDICATOR 6.5 (GLOVE) ×2
GLOVE BIOGEL PI INDICATOR 7.5 (GLOVE) ×6
GLOVE ECLIPSE 7.0 STRL STRAW (GLOVE) ×2 IMPLANT
GLOVE ECLIPSE 7.5 STRL STRAW (GLOVE) ×4 IMPLANT
GLOVE SURG SS PI 6.5 STRL IVOR (GLOVE) ×2 IMPLANT
GOWN STRL REUS W/ TWL LRG LVL3 (GOWN DISPOSABLE) ×3 IMPLANT
GOWN STRL REUS W/TWL LRG LVL3 (GOWN DISPOSABLE) ×15
KIT BASIN OR (CUSTOM PROCEDURE TRAY) ×3 IMPLANT
KIT TURNOVER KIT B (KITS) ×3 IMPLANT
NS IRRIG 1000ML POUR BTL (IV SOLUTION) ×3 IMPLANT
PAD ARMBOARD 7.5X6 YLW CONV (MISCELLANEOUS) ×3 IMPLANT
POUCH SPECIMEN RETRIEVAL 10MM (ENDOMECHANICALS) ×2 IMPLANT
SCISSORS LAP 5X35 DISP (ENDOMECHANICALS) ×3 IMPLANT
SET CHOLANGIOGRAPH 5 50 .035 (SET/KITS/TRAYS/PACK) ×3 IMPLANT
SET IRRIG TUBING LAPAROSCOPIC (IRRIGATION / IRRIGATOR) ×3 IMPLANT
SET TUBE SMOKE EVAC HIGH FLOW (TUBING) ×3 IMPLANT
SLEEVE ENDOPATH XCEL 5M (ENDOMECHANICALS) ×3 IMPLANT
SPECIMEN JAR SMALL (MISCELLANEOUS) ×3 IMPLANT
SPONGE GAUZE 2X2 STER 10/PKG (GAUZE/BANDAGES/DRESSINGS) ×2
STRIP CLOSURE SKIN 1/2X4 (GAUZE/BANDAGES/DRESSINGS) ×2 IMPLANT
SUT MNCRL AB 4-0 PS2 18 (SUTURE) ×3 IMPLANT
TOWEL GREEN STERILE (TOWEL DISPOSABLE) ×3 IMPLANT
TOWEL GREEN STERILE FF (TOWEL DISPOSABLE) ×3 IMPLANT
TRAY LAPAROSCOPIC MC (CUSTOM PROCEDURE TRAY) ×3 IMPLANT
TROCAR XCEL BLUNT TIP 100MML (ENDOMECHANICALS) ×3 IMPLANT
TROCAR XCEL NON-BLD 11X100MML (ENDOMECHANICALS) ×3 IMPLANT
TROCAR XCEL NON-BLD 5MMX100MML (ENDOMECHANICALS) ×3 IMPLANT
WATER STERILE IRR 1000ML POUR (IV SOLUTION) ×3 IMPLANT

## 2019-05-06 NOTE — Anesthesia Preprocedure Evaluation (Addendum)
Anesthesia Evaluation  Patient identified by MRN, date of birth, ID band Patient awake    Reviewed: Allergy & Precautions, NPO status , Patient's Chart, lab work & pertinent test results  History of Anesthesia Complications Negative for: history of anesthetic complications  Airway Mallampati: II  TM Distance: >3 FB Neck ROM: Full    Dental  (+) Dental Advisory Given   Pulmonary COPD,  COPD inhaler, Current Smoker,  05/03/2019 SARS coronavirus neg   breath sounds clear to auscultation       Cardiovascular hypertension, Pt. on medications (-) angina Rhythm:Regular Rate:Normal     Neuro/Psych Anxiety Depression S/p VPS for subarachnoid cyst with hydrocephalus    GI/Hepatic GERD  ,(+)     substance abuse  alcohol use, N/V with acute cholecystitis   Endo/Other  negative endocrine ROS  Renal/GU negative Renal ROS     Musculoskeletal   Abdominal   Peds  Hematology negative hematology ROS (+)   Anesthesia Other Findings   Reproductive/Obstetrics                            Anesthesia Physical Anesthesia Plan  ASA: III  Anesthesia Plan: General   Post-op Pain Management:    Induction: Intravenous and Rapid sequence  PONV Risk Score and Plan: 1 and Ondansetron and Dexamethasone  Airway Management Planned: Oral ETT  Additional Equipment:   Intra-op Plan:   Post-operative Plan: Extubation in OR  Informed Consent: I have reviewed the patients History and Physical, chart, labs and discussed the procedure including the risks, benefits and alternatives for the proposed anesthesia with the patient or authorized representative who has indicated his/her understanding and acceptance.     Dental advisory given  Plan Discussed with: CRNA and Surgeon  Anesthesia Plan Comments:         Anesthesia Quick Evaluation

## 2019-05-06 NOTE — Progress Notes (Signed)
PROGRESS NOTE    Luis Henderson  JJH:[id number] DOB: [birth year] DOA: 05/03/2019 PCP: Ladell Pier, MD    Brief Narrative:  Luis Henderson is a 63 y.o. male with medical history significant of hypertension, hyperlipidemia, polycythemia, nephrolithiasis, and hydrocephalus and subarachnoid cysts s/p VP shunt; who presents with complaints of nausea and vomiting since recently being discharged from the hospital for diverticulitis.  Suspect this hospitalization is due to GB issues and plan is for surgery in AM.   Assessment & Plan:   Principal Problem:   Diverticulitis Active Problems:   Tobacco abuse   Hypertensive urgency   Nausea and vomiting   Polycythemia   Nausea & vomiting   Diverticulitis large intestine  Acute uncomplicated sigmoid diverticulitis:  - intolerance to outpatient therapies and oral antibiotics.   - continuing IV antibiotics -this appears to be resolving and main issue is now GB  RUQ pain with ?Chronic cholecystitis with polyp: -pain is now more localized to the right upper quadrant.   -plan for GB removal today by general surgery  Hypertensive urgency:  -resume home meds -control pain  Smoker: Counseled to quit.  On nicotine patch.    DVT prophylaxis: Lovenox subcu Code Status: Full code Family Communication: None Disposition Plan: home after GB removal (in AM?)   Consultants:  General surgery  Subjective: Once NPO no RUQ pain  Objective: Vitals:   05/05/19 1645 05/05/19 2055 05/06/19 0525 05/06/19 0749  BP: 138/79 (!) 143/91 (!) 142/88 (!) 152/94  Pulse: (!) 55 (!) 58 (!) 53 (!) 55  Resp: 18 18 18 18   Temp: 99 F (37.2 C) 98.6 F (37 C) 98.7 F (37.1 C) 98.6 F (37 C)  TempSrc: Oral Oral Oral Oral  SpO2: 98% 96% 96% 96%  Weight:  88.9 kg    Height:        Intake/Output Summary (Last 24 hours) at 05/06/2019 0919 Last data filed at 05/06/2019 0804 Gross per 24 hour  Intake 800 ml  Output 1800 ml  Net -1000 ml    Filed Weights   05/04/19 0520 05/04/19 2300 05/05/19 2055  Weight: 89.4 kg 88.7 kg 88.9 kg    Examination: In bed, NAD rrr +BS, mild RUQ tenderness No LE edema A+Ox3    Data Reviewed: I have personally reviewed following labs and imaging studies  CBC: Recent Labs  Lab 04/30/19 0044 05/01/19 0442 05/03/19 1148 05/04/19 0221 05/06/19 0436  WBC 11.1* 13.0* 9.7 8.5 8.8  NEUTROABS  --  9.1* 7.2  --   --   HGB 18.2* 18.4* 19.6* 16.9 18.0*  HCT 55.0* 55.8* 58.3* 50.9 54.1*  MCV 88.9 88.6 88.1 87.8 88.3  PLT 427* 335 321 289 818   Basic Metabolic Panel: Recent Labs  Lab 04/30/19 0044 05/01/19 0442 05/03/19 1148 05/06/19 0436  NA 138 141 138 139  K 3.8 3.3* 3.6 3.9  CL 106 107 107 106  CO2 21* 20* 18* 23  GLUCOSE 119* 100* 142* 101*  BUN 13 14 13 10   CREATININE 1.21 1.13 1.09 1.02  CALCIUM 9.3 8.9 9.3 9.0   GFR: Estimated Creatinine Clearance: 77.9 mL/min (by C-G formula based on SCr of 1.02 mg/dL). Liver Function Tests: Recent Labs  Lab 04/30/19 0044 05/01/19 0442  AST 19 15  ALT 17 17  ALKPHOS 82 80  BILITOT 0.9 1.2  PROT 6.7 6.3*  ALBUMIN 4.2 3.9   Recent Labs  Lab 04/30/19 0044 05/06/19 0436  LIPASE 108* 51   No results for  input(s): AMMONIA in the last 168 hours. Coagulation Profile: No results for input(s): INR, PROTIME in the last 168 hours. Cardiac Enzymes: No results for input(s): CKTOTAL, CKMB, CKMBINDEX, TROPONINI in the last 168 hours. BNP (last 3 results) No results for input(s): PROBNP in the last 8760 hours. HbA1C: No results for input(s): HGBA1C in the last 72 hours. CBG: No results for input(s): GLUCAP in the last 168 hours. Lipid Profile: No results for input(s): CHOL, HDL, LDLCALC, TRIG, CHOLHDL, LDLDIRECT in the last 72 hours. Thyroid Function Tests: No results for input(s): TSH, T4TOTAL, FREET4, T3FREE, THYROIDAB in the last 72 hours. Anemia Panel: No results for input(s): VITAMINB12, FOLATE, FERRITIN, TIBC, IRON,  RETICCTPCT in the last 72 hours. Sepsis Labs: No results for input(s): PROCALCITON, LATICACIDVEN in the last 168 hours.  Recent Results (from the past 240 hour(s))  SARS Coronavirus 2 (CEPHEID - Performed in [place] hospital lab), Hosp Order     Status: None   Collection Time: 04/30/19  4:30 AM   Specimen: Nasopharyngeal Swab  Result Value Ref Range Status   SARS Coronavirus 2 NEGATIVE NEGATIVE Final    Comment: (NOTE) If result is NEGATIVE SARS-CoV-2 target nucleic acids are NOT DETECTED. The SARS-CoV-2 RNA is generally detectable in upper and lower  respiratory specimens during the acute phase of infection. The lowest  concentration of SARS-CoV-2 viral copies this assay can detect is 250  copies / mL. A negative result does not preclude SARS-CoV-2 infection  and should not be used as the sole basis for treatment or other  patient management decisions.  A negative result may occur with  improper specimen collection / handling, submission of specimen other  than nasopharyngeal swab, presence of viral mutation(s) within the  areas targeted by this assay, and inadequate number of viral copies  (<250 copies / mL). A negative result must be combined with clinical  observations, patient history, and epidemiological information. If result is POSITIVE SARS-CoV-2 target nucleic acids are DETECTED. The SARS-CoV-2 RNA is generally detectable in upper and lower  respiratory specimens dur ing the acute phase of infection.  Positive  results are indicative of active infection with SARS-CoV-2.  Clinical  correlation with patient history and other diagnostic information is  necessary to determine patient infection status.  Positive results do  not rule out bacterial infection or co-infection with other viruses. If result is PRESUMPTIVE POSTIVE SARS-CoV-2 nucleic acids MAY BE PRESENT.   A presumptive positive result was obtained on the submitted specimen  and confirmed on repeat testing.   While 2019 novel coronavirus  (SARS-CoV-2) nucleic acids may be present in the submitted sample  additional confirmatory testing may be necessary for epidemiological  and / or clinical management purposes  to differentiate between  SARS-CoV-2 and other Sarbecovirus currently known to infect humans.  If clinically indicated additional testing with an alternate test  methodology [phone]) is advised. The SARS-CoV-2 RNA is generally  detectable in upper and lower respiratory sp ecimens during the acute  phase of infection. The expected result is Negative. Fact Sheet for Patients:  StrictlyIdeas.no Fact Sheet for Healthcare Providers: BankingDealers.co.za This test is not yet approved or cleared by the [place] FDA and has been authorized for detection and/or diagnosis of SARS-CoV-2 by FDA under an Emergency Use Authorization (EUA).  This EUA will remain in effect (meaning this test can be used) for the duration of the COVID-19 declaration under Section 564(b)(1) of the Act, 21 U.S.C. section 360bbb-3(b)(1), unless the authorization is terminated or  revoked sooner. Performed at [facility], [place] [address] [place], [place] [address]   SARS Coronavirus 2 (CEPHEID - Performed in [place] hospital lab), Hosp Order     Status: None   Collection Time: 05/03/19 12:47 PM   Specimen: Nasopharyngeal Swab  Result Value Ref Range Status   SARS Coronavirus 2 NEGATIVE NEGATIVE Final    Comment: (NOTE) If result is NEGATIVE SARS-CoV-2 target nucleic acids are NOT DETECTED. The SARS-CoV-2 RNA is generally detectable in upper and lower  respiratory specimens during the acute phase of infection. The lowest  concentration of SARS-CoV-2 viral copies this assay can detect is 250  copies / mL. A negative result does not preclude SARS-CoV-2 infection  and should not be used as the sole basis for treatment or other  patient management decisions.   A negative result may occur with  improper specimen collection / handling, submission of specimen other  than nasopharyngeal swab, presence of viral mutation(s) within the  areas targeted by this assay, and inadequate number of viral copies  (<250 copies / mL). A negative result must be combined with clinical  observations, patient history, and epidemiological information. If result is POSITIVE SARS-CoV-2 target nucleic acids are DETECTED. The SARS-CoV-2 RNA is generally detectable in upper and lower  respiratory specimens dur ing the acute phase of infection.  Positive  results are indicative of active infection with SARS-CoV-2.  Clinical  correlation with patient history and other diagnostic information is  necessary to determine patient infection status.  Positive results do  not rule out bacterial infection or co-infection with other viruses. If result is PRESUMPTIVE POSTIVE SARS-CoV-2 nucleic acids MAY BE PRESENT.   A presumptive positive result was obtained on the submitted specimen  and confirmed on repeat testing.  While 2019 novel coronavirus  (SARS-CoV-2) nucleic acids may be present in the submitted sample  additional confirmatory testing may be necessary for epidemiological  and / or clinical management purposes  to differentiate between  SARS-CoV-2 and other Sarbecovirus currently known to infect humans.  If clinically indicated additional testing with an alternate test  methodology [phone]) is advised. The SARS-CoV-2 RNA is generally  detectable in upper and lower respiratory sp ecimens during the acute  phase of infection. The expected result is Negative. Fact Sheet for Patients:  StrictlyIdeas.no Fact Sheet for Healthcare Providers: BankingDealers.co.za This test is not yet approved or cleared by the [place] FDA and has been authorized for detection and/or diagnosis of SARS-CoV-2 by FDA under an Emergency Use  Authorization (EUA).  This EUA will remain in effect (meaning this test can be used) for the duration of the COVID-19 declaration under Section 564(b)(1) of the Act, 21 U.S.C. section 360bbb-3(b)(1), unless the authorization is terminated or revoked sooner. Performed at [facility], [place] [address], [place], [place] [address]   Surgical pcr screen     Status: None   Collection Time: 05/05/19  7:57 PM   Specimen: Nasal Mucosa; Nasal Swab  Result Value Ref Range Status   MRSA, PCR NEGATIVE NEGATIVE Final   Staphylococcus aureus NEGATIVE NEGATIVE Final    Comment: (NOTE) The Xpert SA Assay (FDA approved for NASAL specimens in patients 74 years of age and older), is one component of a comprehensive surveillance program. It is not intended to diagnose infection nor to guide or monitor treatment. Performed at [facility], [place] [address], [place], [place] [address]          Radiology Studies:  No results found.      Scheduled Meds: . acidophilus  2 capsule Oral Daily  . amLODipine  10 mg Oral Daily  . enoxaparin (LOVENOX) injection  40 mg Subcutaneous Q24H  . sodium chloride flush  3 mL Intravenous Q12H   Continuous Infusions: . ciprofloxacin 400 mg (05/06/19 0745)  . metronidazole 500 mg (05/06/19 0852)     LOS: 2 days    Time spent: 25 minutes     Geradine Girt, DO [facility]   If 7PM-7AM, please contact night-coverage [email or web address] Password [facility] 05/06/2019, 9:19 AM

## 2019-05-06 NOTE — Anesthesia Postprocedure Evaluation (Signed)
Anesthesia Post Note  Patient: [name]  Procedure(s) Performed: LAPAROSCOPIC CHOLECYSTECTOMY WITH INTRAOPERATIVE CHOLANGIOGRAM (N/A Abdomen)     Patient location during evaluation: PACU Anesthesia Type: General Level of consciousness: awake and alert, oriented and patient cooperative Pain management: pain level controlled Vital Signs Assessment: post-procedure vital signs reviewed and stable Respiratory status: spontaneous breathing, nonlabored ventilation, respiratory function stable and patient connected to nasal cannula oxygen Cardiovascular status: blood pressure returned to baseline and stable Postop Assessment: no apparent nausea or vomiting Anesthetic complications: no    Last Vitals:  Vitals:   05/06/19 1513 05/06/19 1528  BP: (!) 143/67 (!) 126/54  Pulse: 72 (!) 53  Resp: 15 18  Temp: (!) 36.2 C   SpO2: 98% 96%    Last Pain:  Vitals:   05/06/19 1513  TempSrc:   PainSc: Asleep                 Emerald Gehres,E. Huston Stonehocker

## 2019-05-06 NOTE — Transfer of Care (Signed)
Immediate Anesthesia Transfer of Care Note  Patient: Luis Henderson  Procedure(s) Performed: LAPAROSCOPIC CHOLECYSTECTOMY WITH INTRAOPERATIVE CHOLANGIOGRAM (N/A Abdomen)  Patient Location: PACU  Anesthesia Type:General  Level of Consciousness: sedated and drowsy  Airway & Oxygen Therapy: Patient Spontanous Breathing and Patient connected to nasal cannula oxygen  Post-op Assessment: Report given to RN and Post -op Vital signs reviewed and stable  Post vital signs: Reviewed and stable  Last Vitals:  Vitals Value Taken Time  BP    Temp 36.2 C 05/06/19 1513  Pulse 54 05/06/19 1515  Resp 18 05/06/19 1515  SpO2 99 % 05/06/19 1515  Vitals shown include unvalidated device data.  Last Pain:  Vitals:   05/06/19 1513  TempSrc:   PainSc: (P) Asleep      Patients Stated Pain Goal: 3 (62/82/41 7530)  Complications: No apparent anesthesia complications

## 2019-05-06 NOTE — Plan of Care (Signed)
  Problem: Pain Managment: Goal: General experience of comfort will improve Outcome: Progressing   

## 2019-05-06 NOTE — Progress Notes (Addendum)
   Subjective/Chief Complaint: Still with some RUQ pain.  No LLQ tenderness  Objective: Vital signs in last 24 hours: Temp:  [98.6 F (37 C)-99 F (37.2 C)] 98.6 F (37 C) (07/21 0749) Pulse Rate:  [53-61] 55 (07/21 0749) Resp:  [18] 18 (07/21 0749) BP: (136-152)/(79-94) 152/94 (07/21 0749) SpO2:  [95 %-98 %] 96 % (07/21 0749) Weight:  [88.9 kg] 88.9 kg (07/20 2055) Last BM Date: 05/05/19  Intake/Output from previous day: 07/20 0701 - 07/21 0700 In: 800 [P.O.:600; IV Piggyback:200] Out: 1900 [Urine:1900] Intake/Output this shift: Total I/O In: 0  Out: 300 [Urine:300]  Gen:  Alert, NAD, well appearing Pulm:  Rate and effort normal Abd: soft,tender in RUQ with guarding, no Murphy's sign. no TTP of lower abdomen. ND, +BS, He has small reducible umbilical hernia. Bilateral inguinal hernias Skin: warm and dry with no rashes noted Psych: A&Ox3 with an appropriate affect.  Lab Results:  Recent Labs    05/04/19 0221 05/06/19 0436  WBC 8.5 8.8  HGB 16.9 18.0*  HCT 50.9 54.1*  PLT 289 300   BMET Recent Labs    05/03/19 1148 05/06/19 0436  NA 138 139  K 3.6 3.9  CL 107 106  CO2 18* 23  GLUCOSE 142* 101*  BUN 13 10  CREATININE 1.09 1.02  CALCIUM 9.3 9.0   PT/INR No results for input(s): LABPROT, INR in the last 72 hours. ABG No results for input(s): PHART, HCO3 in the last 72 hours.  Invalid input(s): PCO2, PO2  Studies/Results: No results found.  Anti-infectives: Anti-infectives (From admission, onward)   Start     Dose/Rate Route Frequency Ordered Stop   05/03/19 2000  ciprofloxacin (CIPRO) IVPB 400 mg     400 mg 200 mL/hr over 60 Minutes Intravenous Every 12 hours 05/03/19 1605     05/03/19 1800  metroNIDAZOLE (FLAGYL) IVPB 500 mg     500 mg 100 mL/hr over 60 Minutes Intravenous Every 8 hours 05/03/19 1605        Assessment/Plan: Diverticulitis - continue abx, clinically improved as pt is nontender on exam Chronic cholecystitis - patient's  symptoms seem to be mostly related to his gallbladder.  Documented biliary dyskinesia in 2018.  Will proceed with lap chole with IOC today.  The surgical procedure has been discussed with the patient.  Potential risks, benefits, alternative treatments, and expected outcomes have been explained.  All of the patient's questions at this time have been answered.  The likelihood of reaching the patient's treatment goal is good.  The patient understand the proposed surgical procedure and wishes to proceed.   FEN: NPO for surgery VTE: SCD's, lovenox ID: Cipro & Flagyl 07/18>> Foley: none Follow up: TBD  LOS: 2 days    Maia Petties 05/06/2019

## 2019-05-06 NOTE — Op Note (Signed)
Laparoscopic Cholecystectomy with IOC Procedure Note  Indications: This patient presents with symptomatic gallbladder disease and biliary dyskinesia.  He presents now for cholecystectomy.  Pre-operative Diagnosis: Chronic cholecystitis  Post-operative Diagnosis: Same  Surgeon: Maia Petties   Assistants: Saverio Danker, PA-C  Anesthesia: General endotracheal anesthesia  ASA Class: 2  Procedure Details  The patient was seen again in the Holding Room. The risks, benefits, complications, treatment options, and expected outcomes were discussed with the patient. The possibilities of reaction to medication, pulmonary aspiration, perforation of viscus, bleeding, recurrent infection, finding a normal gallbladder, the need for additional procedures, failure to diagnose a condition, the possible need to convert to an open procedure, and creating a complication requiring transfusion or operation were discussed with the patient. The likelihood of improving the patient's symptoms with return to their baseline status is good.  The patient and/or family concurred with the proposed plan, giving informed consent. The site of surgery properly noted. The patient was taken to Operating Room, identified as Luis Henderson and the procedure verified as Laparoscopic Cholecystectomy with Intraoperative Cholangiogram. A Time Out was held and the above information confirmed.  Prior to the induction of general anesthesia, antibiotic prophylaxis was administered. General endotracheal anesthesia was then administered and tolerated well. After the induction, the abdomen was prepped with Chloraprep and draped in the sterile fashion. The patient was positioned in the supine position.  Local anesthetic agent was injected into the skin near the umbilicus and an incision made. He has a small umbilical hernia.  We dissected down to the abdominal fascia with blunt dissection.  We entered the hernia sac.   A pursestring suture  of 0-Vicryl was placed around the fascial opening.  The Hasson cannula was inserted and secured with the stay suture.  Pneumoperitoneum was then created with CO2 and tolerated well without any adverse changes in the patient's vital signs. An 11-mm port was placed in the subxiphoid position.  Two 5-mm ports were placed in the right upper quadrant. All skin incisions were infiltrated with a local anesthetic agent before making the incision and placing the trocars.   We positioned the patient in reverse Trendelenburg, tilted slightly to the patient's left.  The gallbladder was identified, the fundus grasped and retracted cephalad. Minimal adhesions, but the gallbladder was quite thickened.  Adhesions were lysed bluntly and with the electrocautery where indicated, taking care not to injure any adjacent organs or viscus. The infundibulum was grasped and retracted laterally, exposing the peritoneum overlying the triangle of Calot. This was then divided and exposed in a blunt fashion. A critical view of the cystic duct and cystic artery was obtained.  The cystic duct was clearly identified and bluntly dissected circumferentially. The cystic duct was ligated with a clip distally.   An incision was made in the cystic duct and the [facility] cholangiogram catheter introduced. The catheter was secured using a clip. A cholangiogram was then obtained which showed good visualization of the distal and proximal biliary tree with no sign of filling defects or obstruction.  Contrast flowed easily into the duodenum. The catheter was then removed.   The cystic duct was then ligated with clips and divided. The cystic artery was identified, dissected free, ligated with clips and divided as well.   The gallbladder was dissected from the liver bed in retrograde fashion with the electrocautery. The gallbladder was removed and placed in an Endocatch sac. The liver bed was irrigated and inspected. Hemostasis was achieved with the  electrocautery. Copious  irrigation was utilized and was repeatedly aspirated until clear.  The gallbladder and Endocatch sac were then removed through the umbilical port site.  The pursestring suture was used to close the umbilical fascial defect.    We again inspected the right upper quadrant for hemostasis.  Pneumoperitoneum was released as we removed the trocars.  4-0 Monocryl was used to close the skin.   Benzoin, steri-strips, and clean dressings were applied. The patient was then extubated and brought to the recovery room in stable condition. Instrument, sponge, and needle counts were correct at closure and at the conclusion of the case.   Findings: Cholecystitis without Cholelithiasis  Estimated Blood Loss: less than 50 mL         Drains: none         Specimens: Gallbladder           Complications: None; patient tolerated the procedure well.         Disposition: PACU - hemodynamically stable.         Condition: stable  Luis Henderson. Luis Dover, MD, [place] [facility] [phone]  05/06/2019 2:50 PM

## 2019-05-06 NOTE — Anesthesia Procedure Notes (Signed)
Procedure Name: Intubation Date/Time: 05/06/2019 2:06 PM Performed by: Imagene Riches, CRNA Pre-anesthesia Checklist: Patient identified, Emergency Drugs available, Suction available and Patient being monitored Patient Re-evaluated:Patient Re-evaluated prior to induction Oxygen Delivery Method: Circle System Utilized Preoxygenation: Pre-oxygenation with 100% oxygen Induction Type: Rapid sequence, Cricoid Pressure applied and IV induction Laryngoscope Size: Miller and 3 Grade View: Grade I Tube type: Oral Tube size: 7.5 mm Number of attempts: 1 Airway Equipment and Method: Stylet and Oral airway Placement Confirmation: ETT inserted through vocal cords under direct vision,  positive ETCO2 and breath sounds checked- equal and bilateral Secured at: 23 cm Tube secured with: Tape Dental Injury: Teeth and Oropharynx as per pre-operative assessment

## 2019-05-07 ENCOUNTER — Encounter (HOSPITAL_COMMUNITY): Payer: Self-pay | Admitting: Surgery

## 2019-05-07 DIAGNOSIS — K811 Chronic cholecystitis: Secondary | ICD-10-CM

## 2019-05-07 LAB — CBC
HCT: 52.3 % — ABNORMAL HIGH (ref 39.0–52.0)
Hemoglobin: 17.7 g/dL — ABNORMAL HIGH (ref 13.0–17.0)
MCH: 29.4 pg (ref 26.0–34.0)
MCHC: 33.8 g/dL (ref 30.0–36.0)
MCV: 86.7 fL (ref 80.0–100.0)
Platelets: 310 10*3/uL (ref 150–400)
RBC: 6.03 MIL/uL — ABNORMAL HIGH (ref 4.22–5.81)
RDW: 13.3 % (ref 11.5–15.5)
WBC: 10 10*3/uL (ref 4.0–10.5)
nRBC: 0 % (ref 0.0–0.2)

## 2019-05-07 LAB — COMPREHENSIVE METABOLIC PANEL
ALT: 45 U/L — ABNORMAL HIGH (ref 0–44)
AST: 82 U/L — ABNORMAL HIGH (ref 15–41)
Albumin: 3.8 g/dL (ref 3.5–5.0)
Alkaline Phosphatase: 63 U/L (ref 38–126)
Anion gap: 13 (ref 5–15)
BUN: 12 mg/dL (ref 8–23)
CO2: 25 mmol/L (ref 22–32)
Calcium: 9.2 mg/dL (ref 8.9–10.3)
Chloride: 102 mmol/L (ref 98–111)
Creatinine, Ser: 1.11 mg/dL (ref 0.61–1.24)
GFR calc Af Amer: >60 mL/min (ref >60)
GFR calc non Af Amer: >60 mL/min (ref >60)
Glucose, Bld: 123 mg/dL — ABNORMAL HIGH (ref 70–99)
Potassium: 4.1 mmol/L (ref 3.5–5.1)
Sodium: 140 mmol/L (ref 135–145)
Total Bilirubin: 0.8 mg/dL (ref 0.3–1.2)
Total Protein: 5.8 g/dL — ABNORMAL LOW (ref 6.5–8.1)

## 2019-05-07 MED ORDER — ALUM & MAG HYDROXIDE-SIMETH 200-200-20 MG/5ML PO SUSP
30.0000 mL | ORAL | Status: DC | PRN
  Administered 2019-05-07: 30 mL via ORAL
  Filled 2019-05-07: qty 30

## 2019-05-07 MED ORDER — AMOXICILLIN-POT CLAVULANATE 875-125 MG PO TABS: 1.0000 | ORAL_TABLET | Freq: Two times a day (BID) | ORAL | 0 refills | Status: DC

## 2019-05-07 NOTE — Plan of Care (Signed)
  Problem: Pain Managment: Goal: General experience of comfort will improve Outcome: Progressing   

## 2019-05-07 NOTE — TOC Initial Note (Signed)
Transition of Care [facility]) - Initial/Assessment Note    Patient Details  Name: Luis Henderson MRN: [id number] Date of Birth: 08-Jan-1956  Transition of Care [facility]) CM/SW Contact:    Bartholomew Crews, RN Phone Number: [phone] 05/07/2019, 5:28 PM  Clinical Narrative:                 Patient to transition home today. PTA living in a [employer] with his 5 cats stating "they are all he has." Patient expressed concerns for antibiotics ordered stating he cannot afford to pick up yet another prescription d/t having already picked up prescriptions for cipro and flagyl. Bedside RN communicated with MD, and no new medications needed at this time.  Discussed transportation home - patient was able to contact a friend who will be able to pick patient up later this evening. Offered taxi voucher, however, patient states he will wait for friend.   Patient stated that his son was concerned about discharging home so soon after surgery. Discussed to follow surgeon recommendations for activity, make f/u appointment, and follow diet as tolerated. Patient states he would feel better at home.   No other transition of care needs identified at this time.   Expected Discharge Plan: Home/Self Care Barriers to Discharge: No Barriers Identified   Patient Goals and CMS Choice   CMS Medicare.gov Compare Post Acute Care list provided to:: Patient Choice offered to / list presented to : NA  Expected Discharge Plan and Services Expected Discharge Plan: Home/Self Care In-house Referral: NA Discharge Planning Services: CM Consult Post Acute Care Choice: NA Living arrangements for the past 2 months: Hotel/Motel Expected Discharge Date: 05/07/19               DME Arranged: N/A DME Agency: NA       HH Arranged: NA [place] Agency: NA        Prior Living Arrangements/Services Living arrangements for the past 2 months: Hotel/Motel Lives with:: Self, Other (Comment)(5 cats) Patient language and need for interpreter  reviewed:: Yes              Criminal Activity/Legal Involvement Pertinent to Current Situation/Hospitalization: No - Comment as needed  Activities of Daily Living Home Assistive Devices/Equipment: None ADL Screening (condition at time of admission) Patient's cognitive ability adequate to safely complete daily activities?: Yes Is the patient deaf or have difficulty hearing?: Yes Does the patient have difficulty seeing, even when wearing glasses/contacts?: Yes Does the patient have difficulty concentrating, remembering, or making decisions?: No Patient able to express need for assistance with ADLs?: No Does the patient have difficulty dressing or bathing?: No Independently performs ADLs?: Yes (appropriate for developmental age) Does the patient have difficulty walking or climbing stairs?: No Weakness of Legs: None Weakness of Arms/Hands: None  Permission Sought/Granted                  Emotional Assessment Appearance:: Appears stated age Attitude/Demeanor/Rapport: Engaged Affect (typically observed): Accepting Orientation: : Oriented to Situation, Oriented to  Time, Oriented to Place, Oriented to Self Alcohol / Substance Use: Not Applicable Psych Involvement: No (comment)  Admission diagnosis:  Diverticulitis [K57.92] Patient Active Problem List   Diagnosis Date Noted  . Diverticulitis large intestine 05/04/2019  . Diverticulitis 04/30/2019  . Acute diverticulitis 04/30/2019  . Inguinal hernia 10/03/2017  . Hypertensive urgency 05/31/2017  . Nausea and vomiting 05/31/2017  . Polycythemia 05/31/2017  . Nausea & vomiting 05/31/2017  . 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 Falls   .  Anxiety and depression   . Chronic bilateral low back pain without sciatica   . ETOH abuse   . Tobacco abuse   . HLD (hyperlipidemia) 09/04/2016  . 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 Abnormal gait 09/03/2016  . Acute encephalopathy 09/03/2016  . Low back pain 06/09/2016  .  HTN (hypertension) 04/01/2015   PCP:  Ladell Pier, MD Pharmacy:   [id number] - [place], [place] [address] Phone: [phone] Fax: [phone]     Social Determinants of Health (SDOH) Interventions    Readmission Risk Interventions No flowsheet data found.

## 2019-05-07 NOTE — Progress Notes (Signed)
Patient ID: Luis Henderson, male   DOB: [birth year], 63 y.o.   MRN: [id number]    1 Day Post-Op  Subjective: No pain from surgery.  Had some nausea this am, but then ate some breakfast and feels well.  Objective: Vital signs in last 24 hours: Temp:  [97 F (36.1 C)-98.5 F (36.9 C)] 98.5 F (36.9 C) (07/22 9518) Pulse Rate:  [53-111] 111 (07/22 0608) Resp:  [15-18] 18 (07/22 0608) BP: (106-191)/(54-98) 176/95 (07/22 0608) SpO2:  [93 %-98 %] 97 % (07/22 8416) Weight:  [88.9 kg] 88.9 kg (07/21 1337) Last BM Date: 05/06/19  Intake/Output from previous day: 07/21 0701 - 07/22 0700 In: 2504.4 [P.O.:120; I.V.:1534.4; IV Piggyback:800] Out: 1275 [Urine:1125; Emesis/NG output:150] Intake/Output this shift: No intake/output data recorded.  PE: Abd: soft, minimally tender, incisions are c/d/i with gauze and tegaderm in place.  +BS  Lab Results:  Recent Labs    05/06/19 0436 05/07/19 0551  WBC 8.8 10.0  HGB 18.0* 17.7*  HCT 54.1* 52.3*  PLT 300 310   BMET Recent Labs    05/06/19 0436 05/07/19 0551  NA 139 140  K 3.9 4.1  CL 106 102  CO2 23 25  GLUCOSE 101* 123*  BUN 10 12  CREATININE 1.02 1.11  CALCIUM 9.0 9.2   PT/INR No results for input(s): LABPROT, INR in the last 72 hours. CMP     Component Value Date/Time   NA 140 05/07/2019 0551   K 4.1 05/07/2019 0551   CL 102 05/07/2019 0551   CO2 25 05/07/2019 0551   GLUCOSE 123 (H) 05/07/2019 0551   BUN 12 05/07/2019 0551   CREATININE 1.11 05/07/2019 0551   CREATININE 0.85 11/16/2017 1209   CREATININE 1.17 04/14/2016 1002   CALCIUM 9.2 05/07/2019 0551   PROT 5.8 (L) 05/07/2019 0551   ALBUMIN 3.8 05/07/2019 0551   AST 82 (H) 05/07/2019 0551   AST 16 11/16/2017 1209   ALT 45 (H) 05/07/2019 0551   ALT 14 11/16/2017 1209   ALKPHOS 63 05/07/2019 0551   BILITOT 0.8 05/07/2019 0551   BILITOT 0.5 11/16/2017 1209   GFRNONAA >60 05/07/2019 0551   GFRNONAA >60 11/16/2017 1209   GFRAA >60 05/07/2019 0551   GFRAA  >60 11/16/2017 1209   Lipase     Component Value Date/Time   LIPASE 51 05/06/2019 0436       Studies/Results: Dg Cholangiogram Operative  Result Date: 05/06/2019 CLINICAL DATA:  63 year old male undergoing laparoscopic cholecystectomy EXAM: INTRAOPERATIVE CHOLANGIOGRAM TECHNIQUE: Cholangiographic images from the C-arm fluoroscopic device were submitted for interpretation post-operatively. Please see the procedural report for the amount of contrast and the fluoroscopy time utilized. COMPARISON:  Abdominal ultrasound 04/30/2019 FINDINGS: A cine clip and saved intraoperative images demonstrate cannulation of the cystic duct remanent and opacification of the biliary tree. No biliary ductal dilatation, stenosis, stricture or evidence of choledocholithiasis. Contrast material passes freely through the ampulla and into the duodenum. Incompletely imaged ventriculoperitoneal shunt catheter. IMPRESSION: Negative intraoperative cholangiogram. Electronically Signed   By: Jacqulynn Cadet M.D.   On: 05/06/2019 18:19    Anti-infectives: Anti-infectives (From admission, onward)   Start     Dose/Rate Route Frequency Ordered Stop   05/03/19 2000  ciprofloxacin (CIPRO) IVPB 400 mg     400 mg 200 mL/hr over 60 Minutes Intravenous Every 12 hours 05/03/19 1605     05/03/19 1800  metroNIDAZOLE (FLAGYL) IVPB 500 mg     500 mg 100 mL/hr over 60 Minutes Intravenous Every 8 hours  05/03/19 1605         Assessment/Plan POD 1, s/p lap chole -patient is doing well surgically.  He is stable for DC home from our standpoint -televisit is being arranged for patient 3 weeks post op -no further abx from gallbladder standpoint warranted. -DC instructions discussed with patient.  He does NOT want narcotics at discharge as he is having no pain currently.  We discussed the use of tylenol and ibuprofen prn.   FEN - regular diet VTE - Lovenox ID - Cipro/flagyl for tics - defer duration of therapy to medicine for this.    LOS: 3 days    Henreitta Cea , [facility] 05/07/2019, 8:44 AM Pager: [phone]

## 2019-05-07 NOTE — Discharge Summary (Addendum)
Discharge Summary  Luis Henderson:[id number] DOB: 10-09-1956  PCP: Luis Pier, MD  Admit date: 05/03/2019 Discharge date: 05/07/2019  Time spent: 35 mins   Recommendations for Outpatient Follow-up:  1. PCP- follow up with repeat lab 2. General surgery follow-up  Discharge Diagnoses:  Active Hospital Problems   Diagnosis Date Noted   Diverticulitis 04/30/2019   Diverticulitis large intestine 05/04/2019   Nausea and vomiting 05/31/2017   Polycythemia 05/31/2017   Nausea & vomiting 05/31/2017   Hypertensive urgency 05/31/2017   Tobacco abuse     Resolved Hospital Problems  No resolved problems to display.    Discharge Condition: Stable  Diet recommendation: Heart healthy  Vitals:   05/07/19 1129 05/07/19 1300  BP: (!) 165/82 122/78  Pulse: 72   Resp: 18 18  Temp:  98.6 F (37 C)  SpO2: 96% 98%    History of present illness:  Luis Henderson a 63 y.o.malewith medical history significant ofhypertension, hyperlipidemia, polycythemia,nephrolithiasis, and hydrocephalus and subarachnoid cysts s/pVP shunt; who presents with complaints of nausea and vomiting since recently being discharged from the hospital for diverticulitis.  Suspect this hospitalization is due to GB issues and surgery on board   Today, patient denies any new complaints.  Was able to tolerate his diet, keep his food down.  Denies any abdominal pain, nausea/vomiting, fever/chills, chest pain, shortness of breath.  Patient stable to be discharged, with close follow-up with PCP and general surgery.  Hospital Course:  Principal Problem:   Diverticulitis Active Problems:   Tobacco abuse   Hypertensive urgency   Nausea and vomiting   Polycythemia   Nausea & vomiting   Diverticulitis large intestine  Chronic cholecystitis s/p cholecystectomy with IOC on 05/06/2019 Currently afebrile, no leukocytosis Mildly elevated AST, ALT likely 2/2 recent surgery Pain  management Follow-up with PCP, general surgery  Acute uncomplicated sigmoid diverticulitis Currently asymptomatic, tolerating diet  S/P IV antibiotics, continue PTA Cipro and Flagyl and complete 10 days total (patient unable to afford Augmentin and assured me he would complete the Cipro and Flagyl previously ordered for him.) Follow-up with PCP  Hypertension Resume home meds  Tobacco abuse  Counseled to quit          Malnutrition Type:  Nutrition Problem: Inadequate oral intake Etiology: nausea, vomiting   Malnutrition Characteristics:  Signs/Symptoms: per patient/family report   Nutrition Interventions:  Interventions: Boost Breeze, MVI   Estimated body mass index is 27.33 kg/m as calculated from the following:   Height as of this encounter: 5\' 11"  (1.803 m).   Weight as of this encounter: 88.9 kg.    Procedures:  Lap chole on 05/06/2019  Consultations:  General surgery  Discharge Exam: BP 122/78 (BP Location: Left Arm)    Pulse 72    Temp 98.6 F (37 C) (Oral)    Resp 18    Ht 5\' 11"  (1.803 m)    Wt 88.9 kg    SpO2 98%    BMI 27.33 kg/m   General: NAD Cardiovascular: S1, S2 present Respiratory: CTA B  Discharge Instructions You were cared for by a hospitalist during your hospital stay. If you have any questions about your discharge medications or the care you received while you were in the hospital after you are discharged, you can call the unit and asked to speak with the hospitalist on call if the hospitalist that took care of you is not available. Once you are discharged, your primary care physician will handle any further medical issues.  Please note that NO REFILLS for any discharge medications will be authorized once you are discharged, as it is imperative that you return to your primary care physician (or establish a relationship with a primary care physician if you do not have one) for your aftercare needs so that they can reassess your need for  medications and monitor your lab values.   Allergies as of 05/07/2019      Reactions   Codeine Itching   Lisinopril Cough      Medication List    TAKE these medications   amLODipine 10 MG tablet Commonly known as: NORVASC Take 1 tablet (10 mg total) by mouth daily.   aspirin EC 81 MG tablet Take 81 mg by mouth daily.   ciprofloxacin 500 MG tablet Commonly known as: Cipro Take 1 tablet (500 mg total) by mouth 2 (two) times daily for 10 days.   metroNIDAZOLE 500 MG tablet Commonly known as: Flagyl Take 1 tablet (500 mg total) by mouth 3 (three) times daily for 10 days.   prochlorperazine 10 MG tablet Commonly known as: COMPAZINE Take 1 tablet (10 mg total) by mouth every 6 (six) hours as needed for nausea or vomiting.      Allergies  Allergen Reactions   Codeine Itching   Lisinopril Cough   Follow-up Information    [facility], [facility] Follow up on 05/27/2019.   Specialty: General Surgery Why: 8:30am.  [facility] will call you at this time for your televisit appointment. Contact information: [address] [place] [address] [phone]        Luis Pier, MD. Schedule an appointment as soon as possible for a visit in 1 week(s).   Specialty: Internal Medicine Contact information: [place] [address] [phone]            The results of significant diagnostics from this hospitalization (including imaging, microbiology, ancillary and laboratory) are listed below for reference.    Significant Diagnostic Studies: Dg Cholangiogram Operative  Result Date: 05/06/2019 CLINICAL DATA:  63 year old male undergoing laparoscopic cholecystectomy EXAM: INTRAOPERATIVE CHOLANGIOGRAM TECHNIQUE: Cholangiographic images from the C-arm fluoroscopic device were submitted for interpretation post-operatively. Please see the procedural report for the amount of contrast and the fluoroscopy time utilized. COMPARISON:  Abdominal  ultrasound 04/30/2019 FINDINGS: A cine clip and saved intraoperative images demonstrate cannulation of the cystic duct remanent and opacification of the biliary tree. No biliary ductal dilatation, stenosis, stricture or evidence of choledocholithiasis. Contrast material passes freely through the ampulla and into the duodenum. Incompletely imaged ventriculoperitoneal shunt catheter. IMPRESSION: Negative intraoperative cholangiogram. Electronically Signed   By: Jacqulynn Cadet M.D.   On: 05/06/2019 18:19   US Abdomen Complete  Result Date: 04/30/2019 CLINICAL DATA:  Abdominal pain EXAM: ABDOMEN ULTRASOUND COMPLETE COMPARISON:  CT from earlier today FINDINGS: Gallbladder: Thick-walled gallbladder without appreciable color Doppler flow, somewhat irregular appearing. There is an 11 mm polyp. No focal tenderness or calcified stone. Common bile duct: Diameter: 6 mm Liver: No focal lesion identified. Within normal limits in parenchymal echogenicity. Portal vein is patent on color Doppler imaging with normal direction of blood flow towards the liver. IVC: No abnormality visualized. Pancreas: Not visualized by ultrasound but seen on prior CT. Spleen: Size and appearance within normal limits. Right Kidney: Length: 11.5 cm. Echogenicity within normal limits. No mass or hydronephrosis visualized. Left Kidney: Length: 13.3 cm. Asymmetric enlargement attributed to a 8 cm cyst with thin benign-appearing septation that is nonenhancing by CT and stable.  No hydronephrosis. Abdominal aorta: Not visualized sonographically. There is atherosclerosis without aneurysm by CT Other findings: None. IMPRESSION: 1. Irregular thick walled gallbladder with similar appearance in 2018 favoring chronic cholecystitis over mass, but need surgical referral. There is an 11 mm polyp which at a minimum would need follow-up; enhanced abdominal MRI may be contributory. 2. No evidence acute cholecystitis. Electronically Signed   By: Monte Fantasia M.D.    On: 04/30/2019 04:23   Ct Abdomen Pelvis W Contrast  Result Date: 04/30/2019 CLINICAL DATA:  63 year old male with abdominal pain, constipation. EXAM: CT ABDOMEN AND PELVIS WITH CONTRAST TECHNIQUE: Multidetector CT imaging of the abdomen and pelvis was performed using the standard protocol following bolus administration of intravenous contrast. CONTRAST:  112mL OMNIPAQUE IOHEXOL 300 MG/ML  SOLN COMPARISON:  CT Abdomen and Pelvis 01/13/2015. CTA chest 09/29/2017. FINDINGS: Lower chest: Negative aside from small hiatal hernia which was apparent in 2018. Hepatobiliary: Abnormal gallbladder is indistinct and hyperenhancing with regional small volume fluid or edema. See series 3, image 20. However, the gallbladder is nondistended. Gallbladder might of been similarly abnormal in 2018 (with wall thickening by ultrasound at that time), but appeared normal in 2016. There is no bile duct enlargement. Liver enhancement remains stable, normal. Pancreas: Negative. Spleen: Negative. Adrenals/Urinary Tract: Large 9 centimeter exophytic left renal cyst with simple fluid density. Right nephrolithiasis, 7 millimeters. Punctate left nephrolithiasis. Symmetric renal enhancement and contrast excretion with normal proximal ureters. Unremarkable urinary bladder. Stomach/Bowel: Negative rectum. Mild to moderate inflammatory stranding along the left lateral margin of the sigmoid colon on series 3, image 63 where a 15 millimeter sigmoid diverticula is noted. Mild if any associated regional sigmoid wall thickening. Diverticulosis continues proximally to the right colon with no other active inflammation. Normal retrocecal appendix. Negative terminal ileum. No dilated small bowel. There is wiring or catheter tubing which tracks into the ventral upper abdomen from the right lower chest and is stable since 2018. Negative intraabdominal stomach. Small duodenal diverticulum on series 3, image 25. No free air. No free fluid.  Vascular/Lymphatic: Extensive Aortoiliac calcified atherosclerosis. Major arterial structures are patent. Portal venous system is patent. No lymphadenopathy, including at the porta hepatis. Reproductive: Chronic fat containing inguinal hernias are stable. Otherwise negative. Other: No pelvic free fluid. Musculoskeletal: Stable grade 1 anterolisthesis at L4-L5 with severe lower lumbar facet degeneration. No acute osseous abnormality identified. IMPRESSION: 1. Mild acute inflammation associated with mid sigmoid colon diverticula compatible focal diverticulitis. No complicating features. Widespread underlying large bowel diverticulosis. 2. Abnormal gallbladder which appears highly thickened and irregular, but may not be significantly changed since 2018. Considering the nonspecific ultrasound appearance of the gallbladder at that time, a follow-up Abdomen MRI (liver protocol without and with contrast) might best evaluate further. 3. Small gastric hernia. Nephrolithiasis. Aortic Atherosclerosis (ICD10-I70.0). Electronically Signed   By: Genevie Ann M.D.   On: 04/30/2019 03:09   Dg Abdomen Acute W/chest  Result Date: 05/03/2019 CLINICAL DATA:  Abdominal pain and vomiting since last night. Hypertension. Dizziness. EXAM: DG ABDOMEN ACUTE W/ 1V CHEST COMPARISON:  KUB 05/31/2017 and chest x-ray 05/31/2017 FINDINGS: Lungs are adequately inflated and otherwise clear. Right-sided ventriculostomy catheter unchanged. Cardiomediastinal silhouette and remainder of the thorax is unchanged. Abdominopelvic images demonstrate a nonobstructive bowel gas pattern. No free peritoneal air. Ventriculostomy catheter is coiled over the central abdomen without significant change. Rounded 10 cm mass over the left abdomen compatible with known large left renal cyst. No 9 mm right renal stone. Remainder the exam is unchanged. IMPRESSION:  No acute cardiopulmonary disease.  Nonobstructive bowel gas pattern. Right-sided nephrolithiasis.  Known large  left renal cyst. Ventriculostomy catheter unchanged. Electronically Signed   By: Marin Olp M.D.   On: 05/03/2019 14:28    Microbiology: Recent Results (from the past 240 hour(s))  SARS Coronavirus 2 (CEPHEID - Performed in [place] hospital lab), Hosp Order     Status: None   Collection Time: 04/30/19  4:30 AM   Specimen: Nasopharyngeal Swab  Result Value Ref Range Status   SARS Coronavirus 2 NEGATIVE NEGATIVE Final    Comment: (NOTE) If result is NEGATIVE SARS-CoV-2 target nucleic acids are NOT DETECTED. The SARS-CoV-2 RNA is generally detectable in upper and lower  respiratory specimens during the acute phase of infection. The lowest  concentration of SARS-CoV-2 viral copies this assay can detect is 250  copies / mL. A negative result does not preclude SARS-CoV-2 infection  and should not be used as the sole basis for treatment or other  patient management decisions.  A negative result may occur with  improper specimen collection / handling, submission of specimen other  than nasopharyngeal swab, presence of viral mutation(s) within the  areas targeted by this assay, and inadequate number of viral copies  (<250 copies / mL). A negative result must be combined with clinical  observations, patient history, and epidemiological information. If result is POSITIVE SARS-CoV-2 target nucleic acids are DETECTED. The SARS-CoV-2 RNA is generally detectable in upper and lower  respiratory specimens dur ing the acute phase of infection.  Positive  results are indicative of active infection with SARS-CoV-2.  Clinical  correlation with patient history and other diagnostic information is  necessary to determine patient infection status.  Positive results do  not rule out bacterial infection or co-infection with other viruses. If result is PRESUMPTIVE POSTIVE SARS-CoV-2 nucleic acids MAY BE PRESENT.   A presumptive positive result was obtained on the submitted specimen  and confirmed on  repeat testing.  While 2019 novel coronavirus  (SARS-CoV-2) nucleic acids may be present in the submitted sample  additional confirmatory testing may be necessary for epidemiological  and / or clinical management purposes  to differentiate between  SARS-CoV-2 and other Sarbecovirus currently known to infect humans.  If clinically indicated additional testing with an alternate test  methodology [phone]) is advised. The SARS-CoV-2 RNA is generally  detectable in upper and lower respiratory sp ecimens during the acute  phase of infection. The expected result is Negative. Fact Sheet for Patients:  StrictlyIdeas.no Fact Sheet for Healthcare Providers: BankingDealers.co.za This test is not yet approved or cleared by the [place] FDA and has been authorized for detection and/or diagnosis of SARS-CoV-2 by FDA under an Emergency Use Authorization (EUA).  This EUA will remain in effect (meaning this test can be used) for the duration of the COVID-19 declaration under Section 564(b)(1) of the Act, 21 U.S.C. section 360bbb-3(b)(1), unless the authorization is terminated or revoked sooner. Performed at [facility], [place] [address], [place], [place] [address]   SARS Coronavirus 2 (CEPHEID - Performed in [place] hospital lab), Hosp Order     Status: None   Collection Time: 05/03/19 12:47 PM   Specimen: Nasopharyngeal Swab  Result Value Ref Range Status   SARS Coronavirus 2 NEGATIVE NEGATIVE Final    Comment: (NOTE) If result is NEGATIVE SARS-CoV-2 target nucleic acids are NOT DETECTED. The SARS-CoV-2 RNA is generally detectable in upper and lower  respiratory specimens during the acute phase of infection. The  lowest  concentration of SARS-CoV-2 viral copies this assay can detect is 250  copies / mL. A negative result does not preclude SARS-CoV-2 infection  and should not be used as the sole basis for treatment or other  patient  management decisions.  A negative result may occur with  improper specimen collection / handling, submission of specimen other  than nasopharyngeal swab, presence of viral mutation(s) within the  areas targeted by this assay, and inadequate number of viral copies  (<250 copies / mL). A negative result must be combined with clinical  observations, patient history, and epidemiological information. If result is POSITIVE SARS-CoV-2 target nucleic acids are DETECTED. The SARS-CoV-2 RNA is generally detectable in upper and lower  respiratory specimens dur ing the acute phase of infection.  Positive  results are indicative of active infection with SARS-CoV-2.  Clinical  correlation with patient history and other diagnostic information is  necessary to determine patient infection status.  Positive results do  not rule out bacterial infection or co-infection with other viruses. If result is PRESUMPTIVE POSTIVE SARS-CoV-2 nucleic acids MAY BE PRESENT.   A presumptive positive result was obtained on the submitted specimen  and confirmed on repeat testing.  While 2019 novel coronavirus  (SARS-CoV-2) nucleic acids may be present in the submitted sample  additional confirmatory testing may be necessary for epidemiological  and / or clinical management purposes  to differentiate between  SARS-CoV-2 and other Sarbecovirus currently known to infect humans.  If clinically indicated additional testing with an alternate test  methodology [phone]) is advised. The SARS-CoV-2 RNA is generally  detectable in upper and lower respiratory sp ecimens during the acute  phase of infection. The expected result is Negative. Fact Sheet for Patients:  StrictlyIdeas.no Fact Sheet for Healthcare Providers: BankingDealers.co.za This test is not yet approved or cleared by the [place] FDA and has been authorized for detection and/or diagnosis of SARS-CoV-2 by FDA under  an Emergency Use Authorization (EUA).  This EUA will remain in effect (meaning this test can be used) for the duration of the COVID-19 declaration under Section 564(b)(1) of the Act, 21 U.S.C. section 360bbb-3(b)(1), unless the authorization is terminated or revoked sooner. Performed at [facility], [place] [address], [place], [place] [address]   Surgical pcr screen     Status: None   Collection Time: 05/05/19  7:57 PM   Specimen: Nasal Mucosa; Nasal Swab  Result Value Ref Range Status   MRSA, PCR NEGATIVE NEGATIVE Final   Staphylococcus aureus NEGATIVE NEGATIVE Final    Comment: (NOTE) The Xpert SA Assay (FDA approved for NASAL specimens in patients 10 years of age and older), is one component of a comprehensive surveillance program. It is not intended to diagnose infection nor to guide or monitor treatment. Performed at [facility], [place] [address] [place], [place] [address]      Labs: Basic Metabolic Panel: Recent Labs  Lab 05/01/19 0442 05/03/19 1148 05/06/19 0436 05/07/19 0551  NA 141 138 139 140  K 3.3* 3.6 3.9 4.1  CL 107 107 106 102  CO2 20* 18* 23 25  GLUCOSE 100* 142* 101* 123*  BUN 14 13 10 12   CREATININE 1.13 1.09 1.02 1.11  CALCIUM 8.9 9.3 9.0 9.2   Liver Function Tests: Recent Labs  Lab 05/01/19 0442 05/07/19 0551  AST 15 82*  ALT 17 45*  ALKPHOS 80 63  BILITOT 1.2 0.8  PROT 6.3* 5.8*  ALBUMIN 3.9 3.8   Recent  Labs  Lab 05/06/19 0436  LIPASE 51   No results for input(s): AMMONIA in the last 168 hours. CBC: Recent Labs  Lab 05/01/19 0442 05/03/19 1148 05/04/19 0221 05/06/19 0436 05/07/19 0551  WBC 13.0* 9.7 8.5 8.8 10.0  NEUTROABS 9.1* 7.2  --   --   --   HGB 18.4* 19.6* 16.9 18.0* 17.7*  HCT 55.8* 58.3* 50.9 54.1* 52.3*  MCV 88.6 88.1 87.8 88.3 86.7  PLT 335 321 289 300 310   Cardiac Enzymes: No results for input(s): CKTOTAL, CKMB, CKMBINDEX, TROPONINI in the last 168 hours. BNP: BNP (last 3 results) No results for  input(s): BNP in the last 8760 hours.  ProBNP (last 3 results) No results for input(s): PROBNP in the last 8760 hours.  CBG: No results for input(s): GLUCAP in the last 168 hours.     Signed:  Alma Friendly, MD [facility] 05/07/2019, 5:04 PM

## 2019-05-07 NOTE — Care Management Important Message (Signed)
Important Message  Patient Details  Name: Luis Henderson MRN: [id number] Date of Birth: [birth year]   Medicare Important Message Given:  Yes     Myron Stankovich 05/07/2019, 1:12 PM

## 2019-05-07 NOTE — Discharge Instructions (Signed)
Email [email or web address] if you have any questions or concerns regarding your incisions.  [facility]  Please arrive at least 30 min before your appointment to complete your check in paperwork.  If you are unable to arrive 30 min prior to your appointment time we may have to cancel or reschedule you. LAPAROSCOPIC SURGERY: POST OP INSTRUCTIONS Always review your discharge instruction sheet given to you by the facility where your surgery was performed. IF YOU HAVE DISABILITY OR FAMILY LEAVE FORMS, YOU MUST BRING THEM TO THE OFFICE FOR PROCESSING.   DO NOT GIVE THEM TO YOUR DOCTOR.  PAIN CONTROL  1. First take acetaminophen (Tylenol) AND/or ibuprofen (Advil) to control your pain after surgery.  Follow directions on package.  Taking acetaminophen (Tylenol) and/or ibuprofen (Advil) regularly after surgery will help to control your pain and lower the amount of prescription pain medication you may need.  You should not take more than 4,000 mg (4 grams) of acetaminophen (Tylenol) in 24 hours.  You should not take ibuprofen (Advil), aleve, motrin, naprosyn or other NSAIDS if you have a history of stomach ulcers or chronic kidney disease.  2. A prescription for pain medication may be given to you upon discharge.  Take your pain medication as prescribed, if you still have uncontrolled pain after taking acetaminophen (Tylenol) or ibuprofen (Advil). 3. Use ice packs to help control pain. 4. If you need a refill on your pain medication, please contact your pharmacy.  They will contact our office to request authorization. Prescriptions will not be filled after 5pm or on week-ends.  HOME MEDICATIONS 5. Take your usually prescribed medications unless otherwise directed.  DIET 6. You should follow a light diet the first few days after arrival home.  Be sure to include lots of fluids daily. Avoid fatty, fried foods.   CONSTIPATION 7. It is common to experience some  constipation after surgery and if you are taking pain medication.  Increasing fluid intake and taking a stool softener (such as Colace) will usually help or prevent this problem from occurring.  A mild laxative (Milk of Magnesia or Miralax) should be taken according to package instructions if there are no bowel movements after 48 hours.  WOUND/INCISION CARE 8. Most patients will experience some swelling and bruising in the area of the incisions.  Ice packs will help.  Swelling and bruising can take several days to resolve.  9. Unless discharge instructions indicate otherwise, follow guidelines below  a. STERI-STRIPS - you may remove your outer bandages 48 hours after surgery, and you may shower at that time.  You have steri-strips (small skin tapes) in place directly over the incision.  These strips should be left on the skin for 7-10 days.   b. DERMABOND/SKIN GLUE - you may shower in 24 hours.  The glue will flake off over the next 2-3 weeks. 10. Any sutures or staples will be removed at the office during your follow-up visit.  ACTIVITIES 11. You may resume regular (light) daily activities beginning the next day--such as daily self-care, walking, climbing stairs--gradually increasing activities as tolerated.  You may have sexual intercourse when it is comfortable.  Refrain from any heavy lifting or straining until approved by your doctor. a. You may drive when you are no longer taking prescription pain medication, you can comfortably wear a seatbelt, and you can safely maneuver your car and apply brakes.  FOLLOW-UP 12. You should see your doctor in the office for a follow-up appointment approximately 2-3 weeks  after your surgery.  You should have been given your post-op/follow-up appointment when your surgery was scheduled.  If you did not receive a post-op/follow-up appointment, make sure that you call for this appointment within a day or two after you arrive home to insure a convenient appointment  time.   WHEN TO CALL YOUR DOCTOR: 1. Fever over 101.0 2. Inability to urinate 3. Continued bleeding from incision. 4. Increased pain, redness, or drainage from the incision. 5. Increasing abdominal pain  The clinic staff is available to answer your questions during regular business hours.  Please dont hesitate to call and ask to speak to one of the nurses for clinical concerns.  If you have a medical emergency, go to the nearest emergency room or call 911.  A surgeon from [facility] is always on call at the hospital. [address] [place], [place], [place]  [address] ? [address], [place], [place]   [phone] ? [phone] ? FAX [phone]    Managing Your Pain After Surgery Without Opioids    Thank you for participating in our program to help patients manage their pain after surgery without opioids. This is part of our effort to provide you with the best care possible, without exposing you or your family to the risk that opioids pose.  What pain can I expect after surgery? You can expect to have some pain after surgery. This is normal. The pain is typically worse the day after surgery, and quickly begins to get better. Many studies have found that many patients are able to manage their pain after surgery with Over-the-Counter (OTC) medications such as Tylenol and Motrin. If you have a condition that does not allow you to take Tylenol or Motrin, notify your surgical team.  How will I manage my pain? The best strategy for controlling your pain after surgery is around the clock pain control with Tylenol (acetaminophen) and Motrin (ibuprofen or Advil). Alternating these medications with each other allows you to maximize your pain control. In addition to Tylenol and Motrin, you can use heating pads or ice packs on your incisions to help reduce your pain.  How will I alternate your regular strength over-the-counter pain medication? You will  take a dose of pain medication every three hours. ; Start by taking 650 mg of Tylenol (2 pills of 325 mg) ; 3 hours later take 600 mg of Motrin (3 pills of 200 mg) ; 3 hours after taking the Motrin take 650 mg of Tylenol ; 3 hours after that take 600 mg of Motrin.   - 1 -  See example - if your first dose of Tylenol is at 12:00 PM   12:00 PM Tylenol 650 mg (2 pills of 325 mg)  3:00 PM Motrin 600 mg (3 pills of 200 mg)  6:00 PM Tylenol 650 mg (2 pills of 325 mg)  9:00 PM Motrin 600 mg (3 pills of 200 mg)  Continue alternating every 3 hours   We recommend that you follow this schedule around-the-clock for at least 3 days after surgery, or until you feel that it is no longer needed. Use the table on the last page of this handout to keep track of the medications you are taking. Important: Do not take more than 3000mg  of Tylenol or 3200mg  of Motrin in a 24-hour period. Do not take ibuprofen/Motrin if you have a history of bleeding stomach ulcers, severe kidney disease, &/or actively taking a blood thinner  What  if I still have pain? If you have pain that is not controlled with the over-the-counter pain medications (Tylenol and Motrin or Advil) you might have what we call breakthrough pain. You will receive a prescription for a small amount of an opioid pain medication such as Oxycodone, Tramadol, or Tylenol with Codeine. Use these opioid pills in the first 24 hours after surgery if you have breakthrough pain. Do not take more than 1 pill every 4-6 hours.  If you still have uncontrolled pain after using all opioid pills, don't hesitate to call our staff using the number provided. We will help make sure you are managing your pain in the best way possible, and if necessary, we can provide a prescription for additional pain medication.   Day 1    Time  Name of Medication Number of pills taken  Amount of Acetaminophen  Pain Level   Comments  AM PM       AM PM       AM PM       AM PM        AM PM       AM PM       AM PM       AM PM       Total Daily amount of Acetaminophen Do not take more than  3,000 mg per day      Day 2    Time  Name of Medication Number of pills taken  Amount of Acetaminophen  Pain Level   Comments  AM PM       AM PM       AM PM       AM PM       AM PM       AM PM       AM PM       AM PM       Total Daily amount of Acetaminophen Do not take more than  3,000 mg per day      Day 3    Time  Name of Medication Number of pills taken  Amount of Acetaminophen  Pain Level   Comments  AM PM       AM PM       AM PM       AM PM          AM PM       AM PM       AM PM       AM PM       Total Daily amount of Acetaminophen Do not take more than  3,000 mg per day      Day 4    Time  Name of Medication Number of pills taken  Amount of Acetaminophen  Pain Level   Comments  AM PM       AM PM       AM PM       AM PM       AM PM       AM PM       AM PM       AM PM       Total Daily amount of Acetaminophen Do not take more than  3,000 mg per day      Day 5    Time  Name of Medication Number of pills taken  Amount of Acetaminophen  Pain Level   Comments  AM PM  AM PM       AM PM       AM PM       AM PM       AM PM       AM PM       AM PM       Total Daily amount of Acetaminophen Do not take more than  3,000 mg per day       Day 6    Time  Name of Medication Number of pills taken  Amount of Acetaminophen  Pain Level  Comments  AM PM       AM PM       AM PM       AM PM       AM PM       AM PM       AM PM       AM PM       Total Daily amount of Acetaminophen Do not take more than  3,000 mg per day      Day 7    Time  Name of Medication Number of pills taken  Amount of Acetaminophen  Pain Level   Comments  AM PM       AM PM       AM PM       AM PM       AM PM       AM PM       AM PM       AM PM       Total Daily amount of Acetaminophen Do not take more than  3,000 mg  per day        For additional information about how and where to safely dispose of unused opioid medications - [employer]  Disclaimer: This document contains information and/or instructional materials adapted from [place] for the typical patient with your condition. It does not replace medical advice from your health care provider because your experience may differ from that of the typical patient. Talk to your health care provider if you have any questions about this document, your condition or your treatment plan. Adapted from [place]

## 2019-05-07 NOTE — Progress Notes (Signed)
DISCHARGE NOTE HOME BRNADON EOFF to be discharged Home per MD order. Discussed prescriptions and follow up appointments with the patient. Prescriptions given to patient; medication list explained in detail. Patient verbalized understanding.  Skin clean, dry and intact without evidence of skin break down, no evidence of skin tears noted. IV catheter discontinued intact. Site without signs and symptoms of complications. Dressing and pressure applied. Pt denies pain at the site currently. No complaints noted.  Patient free of lines, drains, and wounds.   An After Visit Summary (AVS) was printed and given to the patient. Patient escorted via wheelchair, and discharged home via private auto.  Dorthey Sawyer, RN

## 2019-05-08 ENCOUNTER — Encounter: Payer: Self-pay | Admitting: Internal Medicine

## 2019-05-12 ENCOUNTER — Other Ambulatory Visit: Payer: Self-pay | Admitting: Internal Medicine

## 2019-05-12 DIAGNOSIS — L989 Disorder of the skin and subcutaneous tissue, unspecified: Secondary | ICD-10-CM

## 2019-05-21 DIAGNOSIS — D485 Neoplasm of uncertain behavior of skin: Secondary | ICD-10-CM | POA: Diagnosis not present

## 2019-05-21 DIAGNOSIS — L218 Other seborrheic dermatitis: Secondary | ICD-10-CM | POA: Diagnosis not present

## 2019-05-26 ENCOUNTER — Encounter: Payer: Self-pay | Admitting: Internal Medicine

## 2019-05-26 ENCOUNTER — Other Ambulatory Visit: Payer: Self-pay

## 2019-05-26 ENCOUNTER — Ambulatory Visit: Payer: Medicare HMO | Attending: Internal Medicine | Admitting: Internal Medicine

## 2019-05-26 ENCOUNTER — Telehealth: Payer: Self-pay

## 2019-05-26 VITALS — BP 121/79 | HR 82 | Temp 98.7°F | Resp 16 | Ht 71.0 in | Wt 204.4 lb

## 2019-05-26 DIAGNOSIS — Z720 Tobacco use: Secondary | ICD-10-CM

## 2019-05-26 DIAGNOSIS — R69 Illness, unspecified: Secondary | ICD-10-CM | POA: Diagnosis not present

## 2019-05-26 DIAGNOSIS — K029 Dental caries, unspecified: Secondary | ICD-10-CM

## 2019-05-26 DIAGNOSIS — I10 Essential (primary) hypertension: Secondary | ICD-10-CM | POA: Diagnosis not present

## 2019-05-26 DIAGNOSIS — K4020 Bilateral inguinal hernia, without obstruction or gangrene, not specified as recurrent: Secondary | ICD-10-CM

## 2019-05-26 DIAGNOSIS — Z9049 Acquired absence of other specified parts of digestive tract: Secondary | ICD-10-CM

## 2019-05-26 DIAGNOSIS — G911 Obstructive hydrocephalus: Secondary | ICD-10-CM | POA: Diagnosis not present

## 2019-05-26 DIAGNOSIS — F17210 Nicotine dependence, cigarettes, uncomplicated: Secondary | ICD-10-CM

## 2019-05-26 DIAGNOSIS — K5792 Diverticulitis of intestine, part unspecified, without perforation or abscess without bleeding: Secondary | ICD-10-CM | POA: Diagnosis not present

## 2019-05-26 DIAGNOSIS — D751 Secondary polycythemia: Secondary | ICD-10-CM | POA: Diagnosis not present

## 2019-05-26 MED ORDER — AMLODIPINE BESYLATE 10 MG PO TABS: 5.0000 mg | ORAL_TABLET | Freq: Two times a day (BID) | ORAL | 6 refills | Status: DC

## 2019-05-26 NOTE — Progress Notes (Signed)
Patient ID: Luis Henderson, male    DOB: 06-16-1956  MRN: [id number]  CC: re-establish   Subjective: Luis Henderson is a 63 y.o. male who presents to reestablish care for chronic disease management and hospital follow-up  His concerns today include:  Patient with history of HTN, tob dep, polycythemia, diverticulitis, acquired obstructive hydrocephalus status post shunt placement 08/2017  Patient was last seen 09/2017 by Dr. Margarita Rana.  Patient states that he was wrongfully jailed in [place] for 6 months.  Patient hospitalized 7/15-16/2020 with abdominal pain, N/V.  Found to have acute uncomplicated sigmoid diverticulitis.  Treated with antibiotics.  He was noted to have possible chronic cholecystitis on CT scan and bilateral inguinal hernias.  He was seen by general surgery and plan was for outpatient follow-up for both of these issues once his diverticulitis resolved. Readmitted 7/18-22/2020 with nausea and vomiting.  It was suspected that his symptoms were due to to gallbladder issue.  He underwent laparoscopic cholecystectomy on 05/06/2019.  Noted to have mild elevation of AST ALT post surgery thought to be due to the recent gallbladder removal.  Today:  Patient reports that he is recovering well.  Has follow-up appointment with the surgeon at [facility] surgery tomorrow.  He is eating and has bowel movements.  HTN: Supposed to be on amlodipine 10 mg daily.  However he feels that the 10 mg is too much for him so he takes 1/2 tablet in the mornings.  If blood pressure is elevated in the afternoons, he takes the other half. -Checks blood pressure 5-6 times a day.  Reports morning numbers are usually good.  Sometimes in the afternoon blood pressure is in the 150s over 90s.  Polycythemia: Had seen hematology oncology Dr. Alen Blew last year.  Would prefer not to go back to him.  Erythropoietin level was normal.  Jak 2 results I do not see in the system.   -He reports that his hemoglobin  and RBC mass normalized when he was in the hospital.  However in looking at last results, they are not completely normal.  Patient smokes 1 pack a day.  Started smoking at the age of 39 and quit at the age of 44.  He had quit for 16 years and then restarted again 3 years ago.  He plans to quit again and feels the need to. -Denies any history of loud snoring.  Request dental care.  He has several teeth that are broken off and decayed.  He has insurance but is not sure if it offers dental coverage and if so which dentist would be in network for him.   History of hydrocephalus.  He has a shunt in place.  He is not followed by a neurologist.  Denies any chronic headaches or blurred vision.  He states that prior to having the shunt placed he had difficulties with gait and incontinence.  He has history of bilateral inguinal hernia for the past 2 to 3 years.  It is logical and worse on the left side.  He would like to have repaired.  Has an appointment with surgeon tomorrow and plans to discuss it then.  Patient Active Problem List   Diagnosis Date Noted  . Diverticulitis large intestine 05/04/2019  . Diverticulitis 04/30/2019  . Acute diverticulitis 04/30/2019  . Inguinal hernia 10/03/2017  . Hypertensive urgency 05/31/2017  . Nausea and vomiting 05/31/2017  . Polycythemia 05/31/2017  . Nausea & vomiting 05/31/2017  . 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 Falls   .  Anxiety and depression   . Chronic bilateral low back pain without sciatica   . ETOH abuse   . Tobacco abuse   . HLD (hyperlipidemia) 09/04/2016  . 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[place]) 09/04/2016  . Abnormal gait 09/03/2016  . Acute encephalopathy 09/03/2016  . Low back pain 06/09/2016  . HTN (hypertension) 04/01/2015     Current Outpatient Medications on File Prior to Visit  Medication Sig Dispense Refill  . amLODipine (NORVASC) 10 MG tablet Take 1 tablet (10 mg total) by mouth daily. 30 tablet 3  . aspirin EC 81 MG tablet Take 81  mg by mouth daily.    . prochlorperazine (COMPAZINE) 10 MG tablet Take 1 tablet (10 mg total) by mouth every 6 (six) hours as needed for nausea or vomiting. 30 tablet 1   No current facility-administered medications on file prior to visit.     Allergies  Allergen Reactions  . Codeine Itching  . Lisinopril Cough    Social History   Socioeconomic History  . Marital status: Divorced    Spouse name: Not on file  . Number of children: 2  . Years of education: Not on file  . Highest education level: Not on file  Occupational History    Employer: [employer]    Comment: Radio broadcast assistant  Social Needs  . Financial resource strain: Not on file  . Food insecurity    Worry: Not on file    Inability: Not on file  . Transportation needs    Medical: Not on file    Non-medical: Not on file  Tobacco Use  . Smoking status: Current Every Day Smoker    Packs/day: 1.00    Years: 28.00    Pack years: 28.00    Types: Cigarettes  . Smokeless tobacco: Never Used  Substance and Sexual Activity  . Alcohol use: Yes    Alcohol/week: 5.0 - 6.0 standard drinks    Types: 5 - 6 Cans of beer per week    Comment: daily alcohol/drinks beer, wine, and liquor  . Drug use: Yes    Types: Marijuana  . Sexual activity: Not on file  Lifestyle  . Physical activity    Days per week: Not on file    Minutes per session: Not on file  . Stress: Not on file  Relationships  . Social Herbalist on phone: Not on file    Gets together: Not on file    Attends religious service: Not on file    Active member of club or organization: Not on file    Attends meetings of clubs or organizations: Not on file    Relationship status: Not on file  . Intimate partner violence    Fear of current or ex partner: Not on file    Emotionally abused: Not on file    Physically abused: Not on file    Forced sexual activity: Not on file  Other Topics Concern  . Not on file  Social History Narrative  . Not on file     Family History  Problem Relation Age of Onset  . Hyperlipidemia Father     Past Surgical History:  Procedure Laterality Date  . CHOLECYSTECTOMY N/A 05/06/2019   Procedure: LAPAROSCOPIC CHOLECYSTECTOMY WITH INTRAOPERATIVE CHOLANGIOGRAM;  Surgeon: Donnie Mesa, MD;  Location: [facility];  Service: General;  Laterality: N/A;  . CYSTOSCOPY WITH RETROGRADE PYELOGRAM, URETEROSCOPY AND STENT PLACEMENT Right 01/15/2015   Procedure: CYSTOSCOPY, RIGHT URETEROSCOPY/ RETROGRADE PYELOGRAM,HOLMIUM LASER/ LITHOTRIPSY, STENT PLACEMENT, URETERAL BALLOON  DIALATION;  Surgeon: Kathie Rhodes, MD;  Location: [facility];  Service: Urology;  Laterality: Right;  . SKIN GRAFT     left hand  . VENTRICULOPERITONEAL SHUNT Right 09/04/2016   Procedure: SHUNT INSERTION VENTRICULAR-PERITONEAL;  Surgeon: Newman Pies, MD;  Location: [facility];  Service: Neurosurgery;  Laterality: Right;  . WISDOM TOOTH EXTRACTION      ROS: Review of Systems Negative except as stated above  PHYSICAL EXAM: BP 121/79   Pulse 82   Temp 98.7 F (37.1 C) (Oral)   Resp 16   Ht 5\' 11"  (1.803 m)   Wt 204 lb 6.4 oz (92.7 kg)   SpO2 95%   BMI 28.51 kg/m   Physical Exam  General appearance - alert, well appearing, older Caucasian male and in no distress Mental status - normal mood, behavior, speech, dress, motor activity, and thought processes Eyes -Pink conjunctiva Mouth -no oral lesions.  He has several decayed teeth  neck - supple, no significant adenopathy Chest - clear to auscultation, no wheezes, rales or rhonchi, symmetric air entry Heart - normal rate, regular rhythm, normal S1, S2, no murmurs, rubs, clicks or gallops Abdomen - soft, nontender, nondistended, no masses or organomegaly.  Healing scars from recent laparoscopic cholecystectomy GU Male -my CMA Sallyanne Havers is present: Bilateral inguinal hernia.  About the size of an orange on the left and much smaller on the right Extremities -no lower extremity edema  CMP Latest Ref Rng &  Units 05/07/2019 05/06/2019 05/03/2019  Glucose 70 - 99 mg/dL 123(H) 101(H) 142(H)  BUN 8 - 23 mg/dL 12 10 13   Creatinine 0.61 - 1.24 mg/dL 1.11 1.02 1.09  Sodium 135 - 145 mmol/L 140 139 138  Potassium 3.5 - 5.1 mmol/L 4.1 3.9 3.6  Chloride 98 - 111 mmol/L 102 106 107  CO2 22 - 32 mmol/L 25 23 18(L)  Calcium 8.9 - 10.3 mg/dL 9.2 9.0 9.3  Total Protein 6.5 - 8.1 g/dL 5.8(L) - -  Total Bilirubin 0.3 - 1.2 mg/dL 0.8 - -  Alkaline Phos 38 - 126 U/L 63 - -  AST 15 - 41 U/L 82(H) - -  ALT 0 - 44 U/L 45(H) - -   Lipid Panel     Component Value Date/Time   CHOL 178 04/14/2016 1002   TRIG 140 04/14/2016 1002   HDL 47 04/14/2016 1002   CHOLHDL 3.8 04/14/2016 1002   VLDL 28 04/14/2016 1002   LDLCALC 103 04/14/2016 1002    CBC    Component Value Date/Time   WBC 10.0 05/07/2019 0551   RBC 6.03 (H) 05/07/2019 0551   HGB 17.7 (H) 05/07/2019 0551   HGB 18.5 (H) 11/16/2017 1209   HGB 18.8 (H) 05/09/2017 1554   HCT 52.3 (H) 05/07/2019 0551   HCT 54.9 (H) 05/09/2017 1554   PLT 310 05/07/2019 0551   PLT 309 11/16/2017 1209   PLT 281 05/09/2017 1554   MCV 86.7 05/07/2019 0551   MCV 87 05/09/2017 1554   MCH 29.4 05/07/2019 0551   MCHC 33.8 05/07/2019 0551   RDW 13.3 05/07/2019 0551   RDW 15.3 05/09/2017 1554   LYMPHSABS 1.2 05/03/2019 1148   LYMPHSABS 2.5 05/09/2017 1554   MONOABS 0.9 05/03/2019 1148   EOSABS 0.2 05/03/2019 1148   EOSABS 0.3 05/09/2017 1554   BASOSABS 0.1 05/03/2019 1148   BASOSABS 0.1 05/09/2017 1554    ASSESSMENT AND PLAN: 1. Essential hypertension Advised patient to take amlodipine 10 mg half a tablet twice a day since blood  pressure in the evenings tend to be higher.  Continue low-salt diet.  Advised to check blood pressure about twice a week.  He does not need to check his blood pressure 5-6 times a day. - amLODipine (NORVASC) 10 MG tablet; Take 0.5 tablets (5 mg total) by mouth 2 (two) times daily.  Dispense: 30 tablet; Refill: 6  2. Tobacco abuse Advised to  quit.  He is willing and ready to quit.  He does not feel he needs any medication to help him quit.  Discussed health risks associated with smoking.  We discussed lung cancer screening given that he is more than 30 pack years of smoking.  He will check with his insurance to make sure that low-dose CT scan of the chest is covered.  We will address it again on his follow-up visit and order at that time Less than 5-minute spent on counseling.  3. Dental cavities He will find out who is in network for his dental coverage and will let me know if he needs me to submit the referral.  4. Polycythemia - Ambulatory referral to Hematology  5. Non-recurrent bilateral inguinal hernia without obstruction or gangrene Patient will discuss with surgeon tomorrow about repair once he is fully healed from recent cholecystectomy.  If he needs me to submit a referral to [place] surgery specifically to have the inguinal hernias addressed, he will let me know via my chart  6. 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[place]) I recommend establishing care with a local neurologist.  Patient is agreeable to that - Ambulatory referral to Neurology  7. S/P cholecystectomy Healing well.  Keep follow-up appointment with surgeon tomorrow  8. Diverticulitis Resolved.   Patient was given the opportunity to ask questions.  Patient verbalized understanding of the plan and was able to repeat key elements of the plan.   No orders of the defined types were placed in this encounter.    Requested Prescriptions    No prescriptions requested or ordered in this encounter    No follow-ups on file.  Karle Plumber, MD, FACP

## 2019-05-26 NOTE — Patient Instructions (Signed)
You should take the amlodipine as 10 mg half a tablet twice a day.  I have referred you to a neurologist for your history of hydrocephalus.  I have referred you to a hematologist for your history of polycythemia.  Please let me know if you will need a referral to see the dentist.  Let me know if you will need referral to [place] [facility] to be evaluated for inguinal hernia repair.

## 2019-05-26 NOTE — Telephone Encounter (Signed)
Dr. Wynetta Emery will be sending new rx to preferred pharmacy

## 2019-05-26 NOTE — Telephone Encounter (Signed)
1) Medication(s) Requested (by name): AMLODIPINE  2) Pharmacy of Choice:  [facility]  3) Special Requests:  Pt has one week supply left at home   Approved medications will be sent to the pharmacy, we will reach out if there is an issue.  Requests made after 3pm may not be addressed until the following business day!  If a patient is unsure of the name of the medication(s) please note and ask patient to call back when they are able to provide all info, do not send to responsible party until all information is available!

## 2019-05-28 DIAGNOSIS — D234 Other benign neoplasm of skin of scalp and neck: Secondary | ICD-10-CM | POA: Diagnosis not present

## 2019-05-28 DIAGNOSIS — C44311 Basal cell carcinoma of skin of nose: Secondary | ICD-10-CM | POA: Diagnosis not present

## 2019-06-02 DIAGNOSIS — C4491 Basal cell carcinoma of skin, unspecified: Secondary | ICD-10-CM | POA: Diagnosis not present

## 2019-06-09 ENCOUNTER — Ambulatory Visit: Payer: Medicare HMO | Admitting: Neurology

## 2019-06-09 ENCOUNTER — Telehealth: Payer: Self-pay | Admitting: Neurology

## 2019-06-09 ENCOUNTER — Encounter: Payer: Self-pay | Admitting: Neurology

## 2019-06-09 ENCOUNTER — Other Ambulatory Visit: Payer: Self-pay | Admitting: Neurology

## 2019-06-09 ENCOUNTER — Other Ambulatory Visit: Payer: Self-pay

## 2019-06-09 VITALS — BP 114/73 | HR 93 | Temp 98.0°F | Ht 71.0 in | Wt 195.0 lb

## 2019-06-09 DIAGNOSIS — Z982 Presence of cerebrospinal fluid drainage device: Secondary | ICD-10-CM | POA: Diagnosis not present

## 2019-06-09 DIAGNOSIS — G911 Obstructive hydrocephalus: Secondary | ICD-10-CM

## 2019-06-09 DIAGNOSIS — K4090 Unilateral inguinal hernia, without obstruction or gangrene, not specified as recurrent: Secondary | ICD-10-CM | POA: Diagnosis not present

## 2019-06-09 DIAGNOSIS — R69 Illness, unspecified: Secondary | ICD-10-CM | POA: Diagnosis not present

## 2019-06-09 DIAGNOSIS — F1010 Alcohol abuse, uncomplicated: Secondary | ICD-10-CM

## 2019-06-09 DIAGNOSIS — G912 (Idiopathic) normal pressure hydrocephalus: Secondary | ICD-10-CM

## 2019-06-09 DIAGNOSIS — D45 Polycythemia vera: Secondary | ICD-10-CM | POA: Diagnosis not present

## 2019-06-09 NOTE — Telephone Encounter (Signed)
[employer] medicare order sent to GI. They will obtain the auth and reach out to the patient to schedule.  °

## 2019-06-09 NOTE — Patient Instructions (Signed)
Ventriculoperitoneal Shunt Home Guide  A ventriculoperitoneal (VP) shunt is a small plastic tube that is used to drain the cerebrospinal fluid (CSF) from your brain into the space in your abdomen (peritoneum). The peritoneum absorbs this fluid and gets rid of it. The CSF cushions your brain and spine. Normally, your brain releases this fluid and then reabsorbs it through drainage channels. If your brain's drainage channels are not working properly, this fluid builds up and will need to be redirected with a shunt. You may need a VP shunt if you have too much CSF inside your brain (hydrocephalus). Your health care provider determines how much fluid needs to be drained and adjusts the settings on the shunt. Some shunt settings cannot be changed after they have been set (nonprogrammable shunt). Others can be adjusted by your health care provider (programmable shunt). You may feel the tube behind your ear and under your skin where it passes down your neck and your chest before it enters your abdomen. If you have a shunt, you need to take certain precautions and be aware of signs that may indicate a problem with the shunt. After your shunt is placed, it is important to have the following information with you:  The contact information for the surgeon who placed your shunt.  The name and type of VP shunt that you have. When will I have my shunt removed? Your shunt may be temporary or permanent, depending on your condition. For some people, a VP shunt is a lifelong device. What precautions must I follow?  Contact your health care provider if you have a programmable shunt and need to have an MRI for any reason. This is very important because many programmable shunts are sensitive to magnets in MRI machines.  Tell your health care provider about your shunt before you have surgery, especially abdominal surgery. You may need to take antibiotic medicines before having a procedure.  Do not wear tight-fitting hats  or headgear.  Ask your health care provider which activities are safe for you. What are the warning signs of a shunt malfunction? A VP shunt can malfunction or become clogged. If the shunt is not working properly, it will not drain the CSF. This can cause an increase in brain pressure. It is important to know the warning signs of a shunt malfunction because they can start suddenly. Warning signs of a malfunction include:  A headache that gets worse over time.  Vomiting without cause.  Feeling sleepier than usual.  Loss of appetite.  Low energy.  Irritability.  Personality change or confusion.  Vision changes, such as blurry vision, double vision, or loss of vision.  Swelling of the skin that runs along the path of the shunt.  A return of your original symptoms.  Trouble walking.  Inability to control your bladder (urinary incontinence).  Having a seizure. What are the warning signs of a shunt infection? If germs (bacteria) get into the tissue around the shunt, you can develop an infection. This can cause your shunt to stop working properly. Watch for signs of infection, such as:  Fever.  Redness or swelling of the skin along the shunt path.  Pain around the shunt or shunt tubing.  A headache or a stiff neck.  Nausea or vomiting. Get help right away if you:  Are sleepier than usual or have trouble waking up.  Vomit for no reason.  Have a fever.  Notice redness or swelling along the shunt path.  Have a headache that is  getting worse.  Start to twitch or shake (seizure).  Develop vision problems.  Lose coordination or balance.  Become irritable or start to behave abnormally. These symptoms may represent a serious problem that is an emergency. Do not wait to see if the symptoms will go away. Get medical help right away. Call your local emergency services (911 in the [place] Do not drive yourself to the hospital. Summary  A ventriculoperitoneal (VP) shunt  is a small plastic tube used to drain the cerebrospinal fluid (CSF) from your brain into your peritoneum.  You may need a VP shunt if you have hydrocephalus. Your shunt may be temporary or permanent, depending on your condition.  A VP shunt can malfunction or become clogged. If the shunt is not working properly, it will not drain the CSF. The shunt can also get infected.  Warning signs of shunt malfunction include headache, vomiting, drowsiness, loss of appetite, low energy, irritability, vision changes, urinary incontinence, and seizures.  Warning signs of shunt infection include fever, redness or swelling of skin along the shunt path, pain around the shunt, headache, stiff neck, nausea, or vomiting. This information is not intended to replace advice given to you by your health care provider. Make sure you discuss any questions you have with your health care provider. Document Released: 04/21/2005 Document Revised: 11/15/2017 Document Reviewed: 11/08/2017 Elsevier Patient Education  2020 [employer].

## 2019-06-09 NOTE — Progress Notes (Signed)
Provider:  Larey Seat, M D  Referring Provider: Ladell Pier, MD Primary Care Physician:  Ladell Pier, MD  Chief Complaint  Patient presents with  . New Patient (Initial Visit)    pt alone here ,rm 10. pt states that in 2017 had a VP shunt procedure  in hospital as emergency Procedure by Dr Arnoldo Morale. New  PCP wanted him to follow up and make sure things are working properly. pt denies any signs of     HPI:  Luis Henderson is a 63 y.o. male seen here upon a referral  from Dr. Karle Plumber, MD for a follow up on hydrocephalus/ VP shunt.  Luis Henderson reports that in 2017 he gradually lost control of his motor function especially in the lower extremities, and also control of bladder and bowel.  This was related to the development of hydrocephalus.  Dr. Earle Gell was his neurosurgeon who placed the VP shunt emergently.  After the procedure he was arrested and jailed in [place] for 6 month until all charges were reportedly dismissed, he reports that when he returned after his release he faced costs for the upkeeping of his cats, several thousand dollars, and he had lost his home, his wife had left him.  He was unemployed and uninsured- and no follow up. He now has insurance. He developed emesis after a large meals, and he suspects he has some obstruction. He had his gallbladder removed 3 weeks ago. 05-01-2019. Readmitted from 05-02-21-2020 from emesis.  He was also diagnosed with a renal cyst and stones, he described dizziness, polycythemia. Has HTN, bilateral inguinal hernias.   Neurological question is the follow up for shunt VP function.     Review of Systems: Out of a complete 14 system review, the patient complains of only the following symptoms, and all other reviewed systems are negative.  nausea, inflammation, belly pain,   Social History   Socioeconomic History  . Marital status: Divorced    Spouse name: Not on file  . Number of children: 2   . Years of education: Not on file  . Highest education level: Not on file  Occupational History    Employer: [id number]    Comment: Radio broadcast assistant  Social Needs  . Financial resource strain: Not on file  . Food insecurity    Worry: Not on file    Inability: Not on file  . Transportation needs    Medical: Not on file    Non-medical: Not on file  Tobacco Use  . Smoking status: Current Every Day Smoker    Packs/day: 1.00    Years: 28.00    Pack years: 28.00    Types: Cigarettes  . Smokeless tobacco: Never Used  Substance and Sexual Activity  . Alcohol use: Yes    Alcohol/week: 5.0 - 6.0 standard drinks    Types: 5 - 6 Cans of beer per week    Comment: daily alcohol/drinks beer, wine, and liquor  . Drug use: Yes    Types: Marijuana  . Sexual activity: Not on file  Lifestyle  . Physical activity    Days per week: Not on file    Minutes per session: Not on file  . Stress: Not on file  Relationships  . Social Herbalist on phone:     Gets together:     Attends religious service:     Active member of club or organization:     Attends meetings  of clubs or organizations:     Relationship status:   . Intimate partner violence    Fear of current or ex partner:     Emotionally abused:     Physically abused:     Forced sexual activity:   Other Topics Concern  . Not on file   HOMELESS , lives in his [place] car with 5 cats    Social History Narrative  . Not on file  Wife left in 2014 after 76 years of marriage.  Radio broadcast assistant at [employer].     Family History  Problem Relation Age of Onset  . Hyperlipidemia Father     Past Medical History:  Diagnosis Date  . Anxiety   . Depression   . History of prediabetes   . Hyperlipidemia   . Hypertension     Past Surgical History:  Procedure Laterality Date  . CHOLECYSTECTOMY N/A 05/06/2019   Procedure: LAPAROSCOPIC CHOLECYSTECTOMY WITH INTRAOPERATIVE CHOLANGIOGRAM;  Surgeon: Donnie Mesa, MD;  Location: [facility];  Service: General;  Laterality: N/A;  . CYSTOSCOPY WITH RETROGRADE PYELOGRAM, URETEROSCOPY AND STENT PLACEMENT Right 01/15/2015   Procedure: CYSTOSCOPY, RIGHT URETEROSCOPY/ RETROGRADE PYELOGRAM,HOLMIUM LASER/ LITHOTRIPSY, STENT PLACEMENT, URETERAL BALLOON DIALATION;  Surgeon: Kathie Rhodes, MD;  Location: [facility];  Service: Urology;  Laterality: Right;  . SKIN GRAFT     left hand  . VENTRICULOPERITONEAL SHUNT Right 09/04/2016   Procedure: SHUNT INSERTION VENTRICULAR-PERITONEAL;  Surgeon: Newman Pies, MD;  Location: [facility];  Service: Neurosurgery;  Laterality: Right;  . WISDOM TOOTH EXTRACTION      Current Outpatient Medications  Medication Sig Dispense Refill  . amLODipine (NORVASC) 10 MG tablet Take 0.5 tablets (5 mg total) by mouth 2 (two) times daily. 30 tablet 6  . aspirin EC 81 MG tablet Take 81 mg by mouth daily.     No current facility-administered medications for this visit.       Allergies as of 06/09/2019 - Review Complete 06/09/2019  Allergen Reaction Noted  . Codeine Itching 02/05/2012  . 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  Very enlarged ventricals.  Vitals: BP 114/73   Pulse 93   Temp 98 F (36.7 C)   Ht 5\' 11"  (1.803 m)   Wt 195 lb (88.5 kg)   BMI 27.20 kg/m  Last Weight:  Wt Readings from Last 1 Encounters:  06/09/19 195 lb (88.5 kg)   Last Height:   Ht Readings from Last 1 Encounters:  06/09/19 5\' 11"  (1.803 m)    Physical exam:  General: The patient is awake, alert and appears not in acute distress. The patient is  groomed. Head: Normocephalic, atraumatic. Neck is supple. Mallampati 3 poor dentition-  , neck circumference: 15" Cardiovascular:  Regular rate and rhythm , without  murmurs or carotid bruit, and without distended neck veins. Respiratory: Lungs are clear to auscultation. Skin:  Without evidence of edema, or rash Trunk: BMI is 27. The   patient  has normal posture.  Neurologic exam : The patient is awake and alert, oriented to place and time. Memory subjective described as intact. There is a normal attention span & concentration ability.  Speech is fluent without pressure- no dysarthria, dysphonia or aphasia. Mood and affect are appropriate.  Cranial nerves: Pupils are equal and briskly reactive to light. Funduscopic exam without evidence of pallor or edema.  Extraocular movements  in vertical and horizontal  planes intact and without nystagmus.  Visual fields by finger perimetry are intact. Hearing to finger rub intact. Facial sensation intact to fine touch.  Facial motor strength is symmetric and tongue and uvula move midline. Tongue protrusion into either cheek is normal. Shoulder shrug is normal.   Motor exam:  Normal tone ,muscle bulk and symmetric strength in all extremities.  Sensory:  Fine touch, pinprick and vibration were tested in all extremities. Proprioception was normal.  Coordination: Rapid alternating movements in the fingers/hands were normal.  Finger-to-nose maneuver  normal without evidence of ataxia, dysmetria or tremor.  Gait and station: Patient walks without assistive device and is able unassisted to climb up to the exam table. Strength within normal limits. Stance is stable and normal.  Tandem gait is unfragmented. Romberg testing is negative   Deep tendon reflexes: in the upper and lower extremities are symmetric and intact. Babinski deferred.   Assessment:  After physical and neurologic examination, review of laboratory studies, imaging, neurophysiology testing and pre-existing records, assessment is that of :   No evidence of returning intracranial pressure - normal EOM, normal balance and no return of incontinence.   Plan:  Treatment plan and additional workup :   Lets get a CT image of the head to make sure the VP shunt works well, as it clinically apears.     Asencion Partridge Syair Fricker  MD 06/09/2019

## 2019-06-18 ENCOUNTER — Ambulatory Visit
Admission: RE | Admit: 2019-06-18 | Discharge: 2019-06-18 | Disposition: A | Discharge status: Home / Self Care | Payer: Medicare HMO | Source: Ambulatory Visit | Attending: Neurology | Admitting: Neurology

## 2019-06-18 ENCOUNTER — Other Ambulatory Visit: Payer: Self-pay

## 2019-06-18 DIAGNOSIS — G912 (Idiopathic) normal pressure hydrocephalus: Secondary | ICD-10-CM

## 2019-06-24 ENCOUNTER — Encounter: Payer: Self-pay | Admitting: Neurology

## 2019-06-24 ENCOUNTER — Ambulatory Visit: Payer: Self-pay | Admitting: Surgery

## 2019-06-24 NOTE — H&P (Signed)
  History of Present Illness Luis Henderson. Caroly Purewal MD; 06/24/2019 2:09 PM) The patient is a 63 year old male who presents with an inguinal hernia. This is a 63 year old male who underwent urgent laparoscopic cholecystectomy with cholangiogram on 05/06/19 for symptomatic cholecystitis. Part of his workup on that admission included a CT scan which also showed bilateral inguinal hernias containing only fat. The patient is doing well after his gallbladder surgery. He occasionally does have some diarrhea. His appetite has not yet returned to normal. He has no significant pain in his abdomen. He presents now to discuss inguinal hernia repair. The left side seems to bother her more than the right. Anytime he tries to stand and do any type of strenuous activity he gets pain in both groins. Sneezing and coughing also hurts.   Problem List/Past Medical Rodman Key K. Talbert Trembath, MD; 06/24/2019 2:09 PM) BILATERAL INGUINAL HERNIA WITHOUT OBSTRUCTION OR GANGRENE (K40.20) ACUTE CHOLECYSTITIS (K81.0) (K81.0) [05/27/2019]: S/P LAPAROSCOPIC CHOLECYSTECTOMY (Z90.49) (Z90.49) [05/27/2019]:  Diagnostic Studies History (Sonu Kruckenberg K. Sandra Brents, MD; 06/24/2019 2:09 PM) Colonoscopy >10 years ago  Allergies Geni Bers Bartow, RMA; 06/24/2019 1:33 PM) Lisinopril *CHEMICALS* Cough. Codeine and Related Itching. Allergies Reconciled  Medication History [employer], RMA; 06/24/2019 1:33 PM) amLODIPine Besylate (10MG  Tablet, Oral) Active. Medications Reconciled  Social History Luis Henderson. Earnstine Meinders, MD; 06/24/2019 2:09 PM) Alcohol use Occasional alcohol use. Caffeine use Coffee. Illicit drug use Prefer to discuss with provider. Tobacco use Current every day smoker.  Family History Luis Henderson. Eriq Hufford, MD; 06/24/2019 2:09 PM) Breast Cancer Mother. Heart Disease Father. Hypertension Mother. Kidney Disease Father.  Other Problems Luis Henderson. Janelly Switalski, MD; 06/24/2019 2:09 PM) Back Pain Cancer Depression High blood  pressure Kidney Stone Other disease, cancer, significant illness    Vitals Geni Bers Haggett RMA; 06/24/2019 1:33 PM) 06/24/2019 1:33 PM Weight: 197 lb Height: 71in Body Surface Area: 2.1 m Body Mass Index: 27.48 kg/m  Temp.: 98.43F(Temporal)  Pulse: 82 (Regular)  P.OX: 97% (Room air) BP: 118/76 (Standing, Left Arm, Standard)        Physical Exam Rodman Key K. Ysela Hettinger MD; 06/24/2019 2:10 PM)  The physical exam findings are as follows: Note:WDWN in NAD Eyes: Pupils equal, round; sclera anicteric HENT: Oral mucosa moist; good dentition Neck: No masses palpated, no thyromegaly Lungs: CTA bilaterally; normal respiratory effort CV: Regular rate and rhythm; no murmurs; extremities well-perfused with no edema Abd: +bowel sounds, soft, non-tender, no palpable organomegaly; healed surgical incisions; GU: bilateral descended testes; no testicular masses; bilateral inguinal hernias - L > R; reducible Skin: Warm, dry; no sign of jaundice Psychiatric - alert and oriented x 4; calm mood and affect    Assessment & Plan Rodman Key K. Charlita Brian MD; 06/24/2019 1:57 PM)  S/P LAPAROSCOPIC CHOLECYSTECTOMY (Z90.49)   BILATERAL INGUINAL HERNIA WITHOUT OBSTRUCTION OR GANGRENE (K40.20)  Current Plans Schedule for Surgery - Open bilateral inguinal hernia repairs with mesh. The surgical procedure has been discussed with the patient. Potential risks, benefits, alternative treatments, and expected outcomes have been explained. All of the patient's questions at this time have been answered. The likelihood of reaching the patient's treatment goal is good. The patient understand the proposed surgical procedure and wishes to proceed.  Luis Henderson. Georgette Dover, MD, [facility] Surgery  General/ Trauma Surgery Beeper [phone]  06/24/2019 2:12 PM

## 2019-06-28 DIAGNOSIS — R69 Illness, unspecified: Secondary | ICD-10-CM | POA: Diagnosis not present

## 2019-07-29 ENCOUNTER — Other Ambulatory Visit: Payer: Self-pay

## 2019-07-29 ENCOUNTER — Encounter (HOSPITAL_BASED_OUTPATIENT_CLINIC_OR_DEPARTMENT_OTHER): Payer: Self-pay

## 2019-08-01 ENCOUNTER — Other Ambulatory Visit (HOSPITAL_COMMUNITY)
Admission: RE | Admit: 2019-08-01 | Discharge: 2019-08-01 | Disposition: A | Discharge status: Home / Self Care | Payer: Medicare HMO | Source: Ambulatory Visit | Attending: Surgery | Admitting: Surgery

## 2019-08-01 DIAGNOSIS — Z01812 Encounter for preprocedural laboratory examination: Secondary | ICD-10-CM | POA: Diagnosis not present

## 2019-08-01 DIAGNOSIS — Z20828 Contact with and (suspected) exposure to other viral communicable diseases: Secondary | ICD-10-CM | POA: Diagnosis not present

## 2019-08-01 NOTE — Progress Notes (Signed)

## 2019-08-02 LAB — NOVEL CORONAVIRUS, NAA (HOSP ORDER, SEND-OUT TO REF LAB; TAT 18-24 HRS): SARS-CoV-2, NAA: NOT DETECTED

## 2019-08-04 NOTE — Anesthesia Preprocedure Evaluation (Addendum)
Anesthesia Evaluation  Patient identified by MRN, date of birth, ID band  Reviewed: Allergy & Precautions, NPO status , Patient's Chart, lab work & pertinent test results  Airway Mallampati: II  TM Distance: >3 FB     Dental   Pulmonary Current Smoker and Patient abstained from smoking.,    breath sounds clear to auscultation       Cardiovascular hypertension,  Rhythm:Regular Rate:Normal     Neuro/Psych    GI/Hepatic Neg liver ROS, History noted   Endo/Other  negative endocrine ROS  Renal/GU negative Renal ROS     Musculoskeletal   Abdominal   Peds  Hematology   Anesthesia Other Findings   Reproductive/Obstetrics                          Anesthesia Physical Anesthesia Plan  ASA: III  Anesthesia Plan: General   Post-op Pain Management:    Induction: Intravenous  PONV Risk Score and Plan: 1 and Ondansetron, Dexamethasone and Midazolam  Airway Management Planned: Oral ETT  Additional Equipment:   Intra-op Plan:   Post-operative Plan:   Informed Consent:   Plan Discussed with: Anesthesiologist  Anesthesia Plan Comments:         Anesthesia Quick Evaluation

## 2019-08-05 ENCOUNTER — Ambulatory Visit (HOSPITAL_BASED_OUTPATIENT_CLINIC_OR_DEPARTMENT_OTHER): Payer: Medicare HMO | Admitting: Anesthesiology

## 2019-08-05 ENCOUNTER — Ambulatory Visit (HOSPITAL_BASED_OUTPATIENT_CLINIC_OR_DEPARTMENT_OTHER)
Admission: RE | Admit: 2019-08-05 | Discharge: 2019-08-06 | Disposition: A | Discharge status: Home / Self Care | Payer: Medicare HMO | Attending: Surgery | Admitting: Surgery

## 2019-08-05 ENCOUNTER — Other Ambulatory Visit: Payer: Self-pay

## 2019-08-05 ENCOUNTER — Encounter (HOSPITAL_BASED_OUTPATIENT_CLINIC_OR_DEPARTMENT_OTHER)
Admission: RE | Disposition: A | Discharge status: Home / Self Care | Payer: Self-pay | Source: Home / Self Care | Attending: Surgery

## 2019-08-05 ENCOUNTER — Encounter (HOSPITAL_BASED_OUTPATIENT_CLINIC_OR_DEPARTMENT_OTHER): Payer: Self-pay | Admitting: *Deleted

## 2019-08-05 DIAGNOSIS — R69 Illness, unspecified: Secondary | ICD-10-CM | POA: Diagnosis not present

## 2019-08-05 DIAGNOSIS — E785 Hyperlipidemia, unspecified: Secondary | ICD-10-CM | POA: Diagnosis not present

## 2019-08-05 DIAGNOSIS — I10 Essential (primary) hypertension: Secondary | ICD-10-CM | POA: Insufficient documentation

## 2019-08-05 DIAGNOSIS — K4020 Bilateral inguinal hernia, without obstruction or gangrene, not specified as recurrent: Secondary | ICD-10-CM | POA: Diagnosis not present

## 2019-08-05 DIAGNOSIS — G911 Obstructive hydrocephalus: Secondary | ICD-10-CM | POA: Diagnosis not present

## 2019-08-05 DIAGNOSIS — F17200 Nicotine dependence, unspecified, uncomplicated: Secondary | ICD-10-CM | POA: Insufficient documentation

## 2019-08-05 HISTORY — DX: Secondary polycythemia: D75.1

## 2019-08-05 HISTORY — PX: INGUINAL HERNIA REPAIR: SHX194

## 2019-08-05 HISTORY — DX: Personal history of urinary calculi: Z87.442

## 2019-08-05 HISTORY — PX: INSERTION OF MESH: SHX5868

## 2019-08-05 SURGERY — REPAIR, HERNIA, INGUINAL, BILATERAL, ADULT
Anesthesia: General | Site: Groin | Laterality: Bilateral

## 2019-08-05 MED ORDER — HEPARIN (PORCINE) IN NACL 1000-0.9 UT/500ML-% IV SOLN
INTRAVENOUS | Status: AC
  Filled 2019-08-05: qty 500

## 2019-08-05 MED ORDER — ONDANSETRON HCL 4 MG/2ML IJ SOLN
INTRAMUSCULAR | Status: AC
  Filled 2019-08-05: qty 2

## 2019-08-05 MED ORDER — SODIUM CHLORIDE 0.9 % IV SOLN
INTRAVENOUS | Status: DC
  Administered 2019-08-05: 10:00:00 via INTRAVENOUS

## 2019-08-05 MED ORDER — ONDANSETRON HCL 4 MG/2ML IJ SOLN
INTRAMUSCULAR | Status: DC | PRN
  Administered 2019-08-05: 4 mg via INTRAVENOUS

## 2019-08-05 MED ORDER — FENTANYL CITRATE (PF) 100 MCG/2ML IJ SOLN
INTRAMUSCULAR | Status: AC
  Filled 2019-08-05: qty 2

## 2019-08-05 MED ORDER — ONDANSETRON 4 MG PO TBDP: 4.0000 mg | ORAL_TABLET | Freq: Four times a day (QID) | ORAL | Status: DC | PRN

## 2019-08-05 MED ORDER — KETOROLAC TROMETHAMINE 30 MG/ML IJ SOLN
30.0000 mg | Freq: Four times a day (QID) | INTRAMUSCULAR | Status: DC
  Administered 2019-08-05 (×2): 30 mg via INTRAVENOUS
  Filled 2019-08-05 (×2): qty 1

## 2019-08-05 MED ORDER — ACETAMINOPHEN 650 MG RE SUPP: 650.0000 mg | Freq: Four times a day (QID) | RECTAL | Status: DC | PRN

## 2019-08-05 MED ORDER — MIDAZOLAM HCL 2 MG/2ML IJ SOLN
INTRAMUSCULAR | Status: AC
  Filled 2019-08-05: qty 2

## 2019-08-05 MED ORDER — PROPOFOL 10 MG/ML IV BOLUS
INTRAVENOUS | Status: DC | PRN
  Administered 2019-08-05: 200 mg via INTRAVENOUS

## 2019-08-05 MED ORDER — GABAPENTIN 300 MG PO CAPS
300.0000 mg | ORAL_CAPSULE | ORAL | Status: AC
  Administered 2019-08-05: 300 mg via ORAL

## 2019-08-05 MED ORDER — KETOROLAC TROMETHAMINE 30 MG/ML IJ SOLN
INTRAMUSCULAR | Status: DC | PRN
  Administered 2019-08-05: 30 mg via INTRAVENOUS

## 2019-08-05 MED ORDER — ACETAMINOPHEN 500 MG PO TABS
ORAL_TABLET | ORAL | Status: AC
  Filled 2019-08-05: qty 2

## 2019-08-05 MED ORDER — OXYCODONE HCL 5 MG PO TABS
5.0000 mg | ORAL_TABLET | ORAL | Status: DC | PRN
  Administered 2019-08-05 (×2): 5 mg via ORAL
  Filled 2019-08-05 (×3): qty 1

## 2019-08-05 MED ORDER — TRAMADOL HCL 50 MG PO TABS: 50.0000 mg | ORAL_TABLET | Freq: Four times a day (QID) | ORAL | Status: DC | PRN

## 2019-08-05 MED ORDER — EPHEDRINE 5 MG/ML INJ
INTRAVENOUS | Status: AC
  Filled 2019-08-05: qty 10

## 2019-08-05 MED ORDER — ACETAMINOPHEN 500 MG PO TABS
1000.0000 mg | ORAL_TABLET | ORAL | Status: AC
  Administered 2019-08-05: 1000 mg via ORAL

## 2019-08-05 MED ORDER — LIDOCAINE 2% (20 MG/ML) 5 ML SYRINGE
INTRAMUSCULAR | Status: AC
  Filled 2019-08-05: qty 5

## 2019-08-05 MED ORDER — CEFAZOLIN SODIUM-DEXTROSE 2-4 GM/100ML-% IV SOLN
INTRAVENOUS | Status: AC
  Filled 2019-08-05: qty 100

## 2019-08-05 MED ORDER — MIDAZOLAM HCL 2 MG/2ML IJ SOLN
INTRAMUSCULAR | Status: DC | PRN
  Administered 2019-08-05: 2 mg via INTRAVENOUS

## 2019-08-05 MED ORDER — HEPARIN SOD (PORK) LOCK FLUSH 100 UNIT/ML IV SOLN
INTRAVENOUS | Status: AC
  Filled 2019-08-05: qty 5

## 2019-08-05 MED ORDER — METHYLENE BLUE 0.5 % INJ SOLN
INTRAVENOUS | Status: AC
  Filled 2019-08-05: qty 10

## 2019-08-05 MED ORDER — DEXAMETHASONE SODIUM PHOSPHATE 10 MG/ML IJ SOLN
INTRAMUSCULAR | Status: AC
  Filled 2019-08-05: qty 1

## 2019-08-05 MED ORDER — DEXAMETHASONE SODIUM PHOSPHATE 10 MG/ML IJ SOLN
INTRAMUSCULAR | Status: DC | PRN
  Administered 2019-08-05: 10 mg via INTRAVENOUS

## 2019-08-05 MED ORDER — PHENYLEPHRINE 40 MCG/ML (10ML) SYRINGE FOR IV PUSH (FOR BLOOD PRESSURE SUPPORT)
PREFILLED_SYRINGE | INTRAVENOUS | Status: AC
  Filled 2019-08-05: qty 10

## 2019-08-05 MED ORDER — FENTANYL CITRATE (PF) 100 MCG/2ML IJ SOLN: 25.0000 ug | INTRAMUSCULAR | Status: DC | PRN

## 2019-08-05 MED ORDER — CHLORHEXIDINE GLUCONATE CLOTH 2 % EX PADS: 6.0000 | MEDICATED_PAD | Freq: Once | CUTANEOUS | Status: DC

## 2019-08-05 MED ORDER — BUPIVACAINE-EPINEPHRINE 0.25% -1:200000 IJ SOLN
INTRAMUSCULAR | Status: DC | PRN
  Administered 2019-08-05: 20 mL

## 2019-08-05 MED ORDER — DIPHENHYDRAMINE HCL 50 MG/ML IJ SOLN: 12.5000 mg | Freq: Four times a day (QID) | INTRAMUSCULAR | Status: DC | PRN

## 2019-08-05 MED ORDER — FENTANYL CITRATE (PF) 100 MCG/2ML IJ SOLN: 50.0000 ug | INTRAMUSCULAR | Status: DC | PRN

## 2019-08-05 MED ORDER — GABAPENTIN 300 MG PO CAPS
ORAL_CAPSULE | ORAL | Status: AC
  Filled 2019-08-05: qty 1

## 2019-08-05 MED ORDER — MIDAZOLAM HCL 2 MG/2ML IJ SOLN: 1.0000 mg | INTRAMUSCULAR | Status: DC | PRN

## 2019-08-05 MED ORDER — CEFAZOLIN SODIUM-DEXTROSE 2-4 GM/100ML-% IV SOLN
2.0000 g | INTRAVENOUS | Status: AC
  Administered 2019-08-05: 2 g via INTRAVENOUS

## 2019-08-05 MED ORDER — AMLODIPINE BESYLATE 5 MG PO TABS: 5.0000 mg | ORAL_TABLET | Freq: Two times a day (BID) | ORAL | Status: DC

## 2019-08-05 MED ORDER — DIPHENHYDRAMINE HCL 12.5 MG/5ML PO ELIX: 12.5000 mg | ORAL_SOLUTION | Freq: Four times a day (QID) | ORAL | Status: DC | PRN

## 2019-08-05 MED ORDER — SCOPOLAMINE 1 MG/3DAYS TD PT72: 1.0000 | MEDICATED_PATCH | Freq: Once | TRANSDERMAL | Status: DC

## 2019-08-05 MED ORDER — PROPOFOL 10 MG/ML IV BOLUS
INTRAVENOUS | Status: AC
  Filled 2019-08-05: qty 20

## 2019-08-05 MED ORDER — ONDANSETRON HCL 4 MG/2ML IJ SOLN: 4.0000 mg | Freq: Four times a day (QID) | INTRAMUSCULAR | Status: DC | PRN

## 2019-08-05 MED ORDER — LACTATED RINGERS IV SOLN
INTRAVENOUS | Status: DC
  Administered 2019-08-05 (×2): via INTRAVENOUS

## 2019-08-05 MED ORDER — MORPHINE SULFATE (PF) 4 MG/ML IV SOLN
2.0000 mg | INTRAVENOUS | Status: DC | PRN
  Administered 2019-08-05 – 2019-08-06 (×5): 2 mg via INTRAVENOUS
  Filled 2019-08-05 (×3): qty 1

## 2019-08-05 MED ORDER — ACETAMINOPHEN 325 MG PO TABS: 650.0000 mg | ORAL_TABLET | Freq: Four times a day (QID) | ORAL | Status: DC | PRN

## 2019-08-05 MED ORDER — LIDOCAINE 2% (20 MG/ML) 5 ML SYRINGE
INTRAMUSCULAR | Status: DC | PRN
  Administered 2019-08-05: 80 mg via INTRAVENOUS

## 2019-08-05 MED ORDER — BUPIVACAINE-EPINEPHRINE 0.25% -1:200000 IJ SOLN
INTRAMUSCULAR | Status: AC
  Filled 2019-08-05: qty 2

## 2019-08-05 MED ORDER — SODIUM CHLORIDE (PF) 0.9 % IJ SOLN
INTRAMUSCULAR | Status: AC
  Filled 2019-08-05: qty 10

## 2019-08-05 MED ORDER — FENTANYL CITRATE (PF) 100 MCG/2ML IJ SOLN
INTRAMUSCULAR | Status: DC | PRN
  Administered 2019-08-05 (×2): 25 ug via INTRAVENOUS
  Administered 2019-08-05 (×4): 50 ug via INTRAVENOUS

## 2019-08-05 SURGICAL SUPPLY — 57 items
APL PRP STRL LF DISP 70% ISPRP (MISCELLANEOUS) ×1
APL SKNCLS STERI-STRIP NONHPOA (GAUZE/BANDAGES/DRESSINGS) ×1
BENZOIN TINCTURE PRP APPL 2/3 (GAUZE/BANDAGES/DRESSINGS) ×3 IMPLANT
BLADE CLIPPER SURG (BLADE) ×2 IMPLANT
BLADE HEX COATED 2.75 (ELECTRODE) ×3 IMPLANT
BLADE SURG 15 STRL LF DISP TIS (BLADE) ×1 IMPLANT
BLADE SURG 15 STRL SS (BLADE) ×3
CHLORAPREP W/TINT 26 (MISCELLANEOUS) ×3 IMPLANT
CLOSURE WOUND 1/2 X4 (GAUZE/BANDAGES/DRESSINGS) ×1
COVER BACK TABLE REUSABLE LG (DRAPES) ×3 IMPLANT
COVER MAYO STAND REUSABLE (DRAPES) ×3 IMPLANT
COVER WAND RF STERILE (DRAPES) IMPLANT
DECANTER SPIKE VIAL GLASS SM (MISCELLANEOUS) ×3 IMPLANT
DRAIN PENROSE 1/2X12 LTX STRL (WOUND CARE) ×3 IMPLANT
DRAPE LAPAROTOMY TRNSV 102X78 (DRAPES) ×3 IMPLANT
DRAPE UTILITY XL STRL (DRAPES) ×3 IMPLANT
DRSG TEGADERM 4X4.75 (GAUZE/BANDAGES/DRESSINGS) ×6 IMPLANT
ELECT REM PT RETURN 9FT ADLT (ELECTROSURGICAL) ×3
ELECTRODE REM PT RTRN 9FT ADLT (ELECTROSURGICAL) ×1 IMPLANT
GAUZE SPONGE 4X4 12PLY STRL LF (GAUZE/BANDAGES/DRESSINGS) ×3 IMPLANT
GLOVE BIO SURGEON STRL SZ 6.5 (GLOVE) ×1 IMPLANT
GLOVE BIO SURGEON STRL SZ7 (GLOVE) ×5 IMPLANT
GLOVE BIO SURGEONS STRL SZ 6.5 (GLOVE) ×1
GLOVE BIOGEL PI IND STRL 6.5 (GLOVE) IMPLANT
GLOVE BIOGEL PI IND STRL 7.0 (GLOVE) IMPLANT
GLOVE BIOGEL PI IND STRL 7.5 (GLOVE) ×1 IMPLANT
GLOVE BIOGEL PI INDICATOR 6.5 (GLOVE) ×2
GLOVE BIOGEL PI INDICATOR 7.0 (GLOVE) ×2
GLOVE BIOGEL PI INDICATOR 7.5 (GLOVE) ×2
GOWN STRL REUS W/ TWL LRG LVL3 (GOWN DISPOSABLE) ×2 IMPLANT
GOWN STRL REUS W/TWL LRG LVL3 (GOWN DISPOSABLE) ×6
MESH PARIETEX PROGRIP LEFT (Mesh General) ×2 IMPLANT
MESH PARIETEX PROGRIP RIGHT (Mesh General) ×2 IMPLANT
NDL HYPO 25X1 1.5 SAFETY (NEEDLE) ×1 IMPLANT
NEEDLE HYPO 25X1 1.5 SAFETY (NEEDLE) ×3 IMPLANT
NS IRRIG 1000ML POUR BTL (IV SOLUTION) ×2 IMPLANT
PACK BASIN DAY SURGERY FS (CUSTOM PROCEDURE TRAY) ×3 IMPLANT
PENCIL BUTTON HOLSTER BLD 10FT (ELECTRODE) ×3 IMPLANT
SLEEVE SCD COMPRESS KNEE MED (MISCELLANEOUS) ×3 IMPLANT
SPONGE INTESTINAL PEANUT (DISPOSABLE) ×3 IMPLANT
SPONGE LAP 4X18 RFD (DISPOSABLE) ×2 IMPLANT
STRIP CLOSURE SKIN 1/2X4 (GAUZE/BANDAGES/DRESSINGS) ×2 IMPLANT
SUT MON AB 4-0 PC3 18 (SUTURE) ×6 IMPLANT
SUT PDS AB 0 CT 36 (SUTURE) IMPLANT
SUT SILK 2 0 SH (SUTURE) IMPLANT
SUT SILK 3 0 SH 30 (SUTURE) IMPLANT
SUT SILK 3 0 TIES 17X18 (SUTURE)
SUT SILK 3-0 18XBRD TIE BLK (SUTURE) IMPLANT
SUT VIC AB 0 CT1 27 (SUTURE) ×3
SUT VIC AB 0 CT1 27XBRD ANBCTR (SUTURE) ×1 IMPLANT
SUT VIC AB 0 SH 27 (SUTURE) IMPLANT
SUT VIC AB 2-0 SH 27 (SUTURE) ×6
SUT VIC AB 2-0 SH 27XBRD (SUTURE) ×2 IMPLANT
SUT VIC AB 3-0 SH 27 (SUTURE) ×6
SUT VIC AB 3-0 SH 27X BRD (SUTURE) ×2 IMPLANT
SYR CONTROL 10ML LL (SYRINGE) ×3 IMPLANT
TOWEL GREEN STERILE FF (TOWEL DISPOSABLE) ×3 IMPLANT

## 2019-08-05 NOTE — H&P (Signed)
History of Present Illness  The patient is a 63 year old male who presents with an inguinal hernia. This is a 63 year old male who underwent urgent laparoscopic cholecystectomy with cholangiogram on 05/06/19 for symptomatic cholecystitis. Part of his workup on that admission included a CT scan which also showed bilateral inguinal hernias containing only fat. The patient is doing well after his gallbladder surgery. He occasionally does have some diarrhea. His appetite has not yet returned to normal. He has no significant pain in his abdomen. He presents now to discuss inguinal hernia repair. The left side seems to bother her more than the right. Anytime he tries to stand and do any type of strenuous activity he gets pain in both groins. Sneezing and coughing also hurts.   Problem List/Past Medical  BILATERAL INGUINAL HERNIA WITHOUT OBSTRUCTION OR GANGRENE (K40.20) ACUTE CHOLECYSTITIS (K81.0) (K81.0) [05/27/2019]: S/P LAPAROSCOPIC CHOLECYSTECTOMY (Z90.49) (Z90.49) [05/27/2019]:  Diagnostic Studies History  Colonoscopy >10 years ago  Allergies  Lisinopril *CHEMICALS* Cough. Codeine and Related Itching. Allergies Reconciled  Medication History  amLODIPine Besylate (10MG  Tablet, Oral) Active. Medications Reconciled  Social History  Alcohol use Occasional alcohol use. Caffeine use Coffee. Illicit drug use Prefer to discuss with provider. Tobacco use Current every day smoker.  Family History  Breast Cancer Mother. Heart Disease Father. Hypertension Mother. Kidney Disease Father.  Other Problems Back Pain Cancer Depression High blood pressure Kidney Stone Other disease, cancer, significant illness    Vitals Weight: 197 lb Height: 71in Body Surface Area: 2.1 m Body Mass Index: 27.48 kg/m  Temp.: 98.73F(Temporal)  Pulse: 82 (Regular)  P.OX: 97% (Room air) BP: 118/76 (Standing, Left Arm,  Standard)        Physical Exam   The physical exam findings are as follows: Note:WDWN in NAD Eyes: Pupils equal, round; sclera anicteric HENT: Oral mucosa moist; good dentition Neck: No masses palpated, no thyromegaly Lungs: CTA bilaterally; normal respiratory effort CV: Regular rate and rhythm; no murmurs; extremities well-perfused with no edema Abd: +bowel sounds, soft, non-tender, no palpable organomegaly; healed surgical incisions; GU: bilateral descended testes; no testicular masses; bilateral inguinal hernias - L > R; reducible Skin: Warm, dry; no sign of jaundice Psychiatric - alert and oriented x 4; calm mood and affect    Assessment & Plan   S/P LAPAROSCOPIC CHOLECYSTECTOMY (Z90.49)   BILATERAL INGUINAL HERNIA WITHOUT OBSTRUCTION OR GANGRENE (K40.20)  Current Plans Schedule for Surgery - Open bilateral inguinal hernia repairs with mesh. The surgical procedure has been discussed with the patient. Potential risks, benefits, alternative treatments, and expected outcomes have been explained. All of the patient's questions at this time have been answered. The likelihood of reaching the patient's treatment goal is good. The patient understand the proposed surgical procedure and wishes to proceed.  Imogene Burn. Georgette Dover, MD, [facility] Surgery  General/ Trauma Surgery   08/05/2019 7:06 AM

## 2019-08-05 NOTE — Op Note (Signed)
Hernia, Open, Procedure Note  Indications: The patient presented with a history of a bilateral reducible inguinal hernia.    Pre-operative Diagnosis: bilateral reducible inguinal hernia Post-operative Diagnosis: same  Surgeon: Maia Petties   Assistants: none  Anesthesia: General LMA anesthesia  ASA Class: 2  Procedure Details  The patient was seen again in the Holding Room. The risks, benefits, complications, treatment options, and expected outcomes were discussed with the patient. The possibilities of reaction to medication, pulmonary aspiration, perforation of viscus, bleeding, recurrent infection, the need for additional procedures, and development of a complication requiring transfusion or further operation were discussed with the patient and/or family. The likelihood of success in repairing the hernia and returning the patient to their previous functional status is good.  There was concurrence with the proposed plan, and informed consent was obtained. The site of surgery was properly noted/marked. The patient was taken to the Operating Room, identified as Luis Henderson, and the procedure verified as bilateral inguinal hernia repair. A Time Out was held and the above information confirmed.  The patient was placed in the supine position and underwent induction of anesthesia. The lower abdomen and groin was prepped with Chloraprep and draped in the standard fashion, and 0.25% Marcaine with epinephrine was used to anesthetize the skin over the mid-portion of the right inguinal canal. An oblique incision was made. Dissection was carried down through the subcutaneous tissue with cautery to the external oblique fascia.  We opened the external oblique fascia along the direction of its fibers to the external ring.  The spermatic cord was circumferentially dissected bluntly and retracted with a Penrose drain.  The ilioinguinal nerve was identified and preserved.  The floor of the inguinal canal  was inspected and revealed a large direct hernia defect.  We reduced the direct hernia sac and closed the floor of the canal with 0 Vicryl.  We skeletonized the spermatic cord and did not identify any indirect hernia.  We used a right Progrip mesh which was inserted and deployed across the floor of the inguinal canal. The mesh was tucked underneath the external oblique fascia laterally.  The flap of the mesh was closed around the spermatic cord to recreate the internal inguinal ring.  The mesh was secured to the pubic tubercle with 0 Vicryl.  Additional stay sutures were used to secure the mesh to the shelving edge inferiorly and to close the mesh flap.  The external oblique fascia was reapproximated with 2-0 Vicryl.  3-0 Vicryl was used to close the subcutaneous tissues and 4-0 Monocryl was used to close the skin in subcuticular fashion.   We turned our attention to the left side. 0.25% Marcaine with epinephrine was used to anesthetize the skin over the mid-portion of the inguinal canal. An oblique incision was made. Dissection was carried down through the subcutaneous tissue with cautery to the external oblique fascia.  We opened the external oblique fascia along the direction of its fibers to the external ring.  The spermatic cord was circumferentially dissected bluntly and retracted with a Penrose drain.  The ilioinguinal nerve was identified and preserved.  The floor of the inguinal canal was inspected and revealed a large direct hernia.  We skeletonized the spermatic cord and there was no sign of indirect hernia.  We used a left Progrip mesh which was inserted and deployed across the floor of the inguinal canal. The mesh was tucked underneath the external oblique fascia laterally.  The flap of the mesh was closed  around the spermatic cord to recreate the internal inguinal ring.  The mesh was secured to the pubic tubercle with 0 Vicryl. Additional stay sutures were used to secure the mesh to the shelving  edge inferiorly and to close the mesh flap.  The external oblique fascia was reapproximated with 2-0 Vicryl.  3-0 Vicryl was used to close the subcutaneous tissues and 4-0 Monocryl was used to close the skin in subcuticular fashion.    Benzoin and steri-strips were used to seal the incision.  A clean dressing was applied.  The patient was then extubated and brought to the recovery room in stable condition.  All sponge, instrument, and needle counts were correct prior to closure and at the conclusion of the case.   Estimated Blood Loss: Minimal                 Complications: None; patient tolerated the procedure well.         Disposition: PACU - hemodynamically stable.         Condition: stable  Luis Henderson. Luis Dover, MD, [facility] Surgery  General/ Trauma Surgery   08/05/2019 9:20 AM

## 2019-08-05 NOTE — Anesthesia Procedure Notes (Signed)
Procedure Name: LMA Insertion Date/Time: 08/05/2019 7:35 AM Performed by: [place] Emara Lichter C, CRNA Pre-anesthesia Checklist: Patient identified, Emergency Drugs available, Suction available and Patient being monitored Patient Re-evaluated:Patient Re-evaluated prior to induction Oxygen Delivery Method: Circle system utilized Preoxygenation: Pre-oxygenation with 100% oxygen Induction Type: IV induction Ventilation: Mask ventilation without difficulty LMA: LMA inserted LMA Size: 4.0 Number of attempts: 1 Airway Equipment and Method: Bite block Placement Confirmation: positive ETCO2 Tube secured with: Tape Dental Injury: Teeth and Oropharynx as per pre-operative assessment

## 2019-08-05 NOTE — Anesthesia Postprocedure Evaluation (Signed)
Anesthesia Post Note  Patient: Luis Henderson  Procedure(s) Performed: OPEN BILATERAL INGUINAL HERNIA REPAIRS WITH MESH (Bilateral Groin) INSERTION OF MESH (Bilateral Groin)     Patient location during evaluation: PACU Anesthesia Type: General Level of consciousness: awake Pain management: pain level controlled Vital Signs Assessment: post-procedure vital signs reviewed and stable Respiratory status: spontaneous breathing Cardiovascular status: stable Postop Assessment: no apparent nausea or vomiting Anesthetic complications: no    Last Vitals:  Vitals:   08/05/19 1130 08/05/19 1220  BP:    Pulse: 77 69  Resp:    Temp:    SpO2: 98% 99%    Last Pain:  Vitals:   08/05/19 1220  TempSrc:   PainSc: 1                  Charmain Diosdado

## 2019-08-05 NOTE — Discharge Instructions (Signed)
[facility] _______[facility] ° °UMBILICAL OR INGUINAL HERNIA REPAIR: POST OP INSTRUCTIONS ° °Always review your discharge instruction sheet given to you by the facility where your surgery was performed. °IF YOU HAVE DISABILITY OR FAMILY LEAVE FORMS, YOU MUST BRING THEM TO THE OFFICE FOR PROCESSING.   °DO NOT GIVE THEM TO YOUR DOCTOR. ° °1. A  prescription for pain medication may be given to you upon discharge.  Take your pain medication as prescribed, if needed.  If narcotic pain medicine is not needed, then you may take acetaminophen (Tylenol) or ibuprofen (Advil) as needed. °2. Take your usually prescribed medications unless otherwise directed. °If you need a refill on your pain medication, please contact your pharmacy.  They will contact our office to request authorization. Prescriptions will not be filled after 5 pm or on week-ends. °3. You should follow a light diet the first 24 hours after arrival home, such as soup and crackers, etc.  Be sure to include lots of fluids daily.  Resume your normal diet the day after surgery. °4.Most patients will experience some swelling and bruising around the umbilicus or in the groin and scrotum.  Ice packs and reclining will help.  Swelling and bruising can take several days to resolve.  °6. It is common to experience some constipation if taking pain medication after surgery.  Increasing fluid intake and taking a stool softener (such as Colace) will usually help or prevent this problem from occurring.  A mild laxative (Milk of Magnesia or Miralax) should be taken according to package directions if there are no bowel movements after 48 hours. °7. Unless discharge instructions indicate otherwise, you may remove your bandages 24-48 hours after surgery, and you may shower at that time.  You may have steri-strips (small skin tapes) in place directly over the incision.  These strips should be left on the skin for 7-10 days.  If your surgeon used skin glue on the  incision, you may shower in 24 hours.  The glue will flake off over the next 2-3 weeks.  Any sutures or staples will be removed at the office during your follow-up visit. °8. ACTIVITIES:  You may resume regular (light) daily activities beginning the next day--such as daily self-care, walking, climbing stairs--gradually increasing activities as tolerated.  You may have sexual intercourse when it is comfortable.  Refrain from any heavy lifting or straining until approved by your doctor. ° °a.You may drive when you are no longer taking prescription pain medication, you can comfortably wear a seatbelt, and you can safely maneuver your car and apply brakes. °b.RETURN TO WORK:   °_____________________________________________ ° °9.You should see your doctor in the office for a follow-up appointment approximately 2-3 weeks after your surgery.  Make sure that you call for this appointment within a day or two after you arrive home to insure a convenient appointment time. °10.OTHER INSTRUCTIONS: _________________________ °   _____________________________________ ° °WHEN TO CALL YOUR DOCTOR: °1. Fever over 101.0 °2. Inability to urinate °3. Nausea and/or vomiting °4. Extreme swelling or bruising °5. Continued bleeding from incision. °6. Increased pain, redness, or drainage from the incision ° °The clinic staff is available to answer your questions during regular business hours.  Please don’t hesitate to call and ask to speak to one of the nurses for clinical concerns.  If you have a medical emergency, go to the nearest emergency room or call 911.  A surgeon from [facility] is always on call at the hospital ° ° °  [address], [address], [place], [place]  [address] ? ° [address] [address], [place], [place]   [address] °[phone] ? [phone] ? FAX [phone] °Web site: [email or web address] °

## 2019-08-05 NOTE — Transfer of Care (Signed)
Immediate Anesthesia Transfer of Care Note  Patient: Luis Henderson  Procedure(s) Performed: OPEN BILATERAL INGUINAL HERNIA REPAIRS WITH MESH (Bilateral Groin) INSERTION OF MESH (Bilateral Groin)  Patient Location: PACU  Anesthesia Type:General  Level of Consciousness: awake, alert  and oriented  Airway & Oxygen Therapy: Patient Spontanous Breathing and Patient connected to face mask oxygen  Post-op Assessment: Report given to RN and Post -op Vital signs reviewed and stable  Post vital signs: Reviewed and stable  Last Vitals:  Vitals Value Taken Time  BP    Temp    Pulse 79 08/05/19 0920  Resp 18 08/05/19 0920  SpO2 100 % 08/05/19 0920  Vitals shown include unvalidated device data.  Last Pain:  Vitals:   08/05/19 [id number]  TempSrc: Oral  PainSc: 0-No pain         Complications: No apparent anesthesia complications

## 2019-08-06 ENCOUNTER — Encounter (HOSPITAL_BASED_OUTPATIENT_CLINIC_OR_DEPARTMENT_OTHER): Payer: Self-pay | Admitting: Surgery

## 2019-08-06 DIAGNOSIS — K4020 Bilateral inguinal hernia, without obstruction or gangrene, not specified as recurrent: Secondary | ICD-10-CM | POA: Diagnosis not present

## 2019-08-06 NOTE — Discharge Summary (Signed)
Physician Discharge Summary  Patient ID: Luis Henderson MRN: [id number] DOB/AGE: 19-Feb-1956 63 y.o.  Admit date: 08/05/2019 Discharge date: 08/06/2019  Admission Diagnoses:Bilateral inguinal hernias  Discharge Diagnoses: Bilateral inguinal hernias Active Problems:   Bilateral inguinal hernia   Discharged Condition: good  Hospital Course: Open bilateral inguinal hernia repairs with mesh on 10/21 at [facility].  Stayed overnight in [facility] due to transportation issues and nobody available at home.  Did well overnight and was discharged.   Treatments: surgery: Open bilateral inguinal hernia repairs with mesh  Discharge Exam: Blood pressure (!) 157/85, pulse 71, temperature 98.8 F (37.1 C), resp. rate 18, height 5\' 11"  (1.803 m), weight 88.1 kg, SpO2 98 %. General appearance: alert, cooperative and no distress GI: Incisions c/d/i; minimal pain  Disposition: Discharge disposition: 01-Home or Self Care       Discharge Instructions    Call MD for:  persistant nausea and vomiting   Complete by: As directed    Call MD for:  redness, tenderness, or signs of infection (pain, swelling, redness, odor or green/yellow discharge around incision site)   Complete by: As directed    Call MD for:  severe uncontrolled pain   Complete by: As directed    Call MD for:  temperature >100.4   Complete by: As directed    Diet general   Complete by: As directed    Driving Restrictions   Complete by: As directed    Do not drive while taking pain medications   Increase activity slowly   Complete by: As directed    May shower / Bathe   Complete by: As directed       Follow-up Information    Donnie Mesa, MD. Schedule an appointment as soon as possible for a visit in 3 weeks.   Specialty: General Surgery Contact information: [address] [place] [address] [phone]           Signed: Maia Petties 08/06/2019, 8:22 AM

## 2019-09-01 ENCOUNTER — Ambulatory Visit: Payer: Medicare HMO | Attending: Internal Medicine | Admitting: Internal Medicine

## 2019-09-01 ENCOUNTER — Other Ambulatory Visit: Payer: Self-pay

## 2019-09-01 ENCOUNTER — Encounter: Payer: Self-pay | Admitting: Internal Medicine

## 2019-09-01 DIAGNOSIS — K529 Noninfective gastroenteritis and colitis, unspecified: Secondary | ICD-10-CM | POA: Diagnosis not present

## 2019-09-01 NOTE — Progress Notes (Signed)
Pt states he goes through hot and cold flashes in a minute   Pt states he doesn't have any pain he is having discomfort

## 2019-09-01 NOTE — Progress Notes (Signed)
Virtual Visit via Telephone Note Due to current restrictions/limitations of in-office visits due to the COVID-19 pandemic, this scheduled clinical appointment was converted to a telehealth visit  I connected with Luis Henderson on 09/01/19 at 8:47 a.m by telephone and verified that I am speaking with the correct person using two identifiers. I am in my office.  The patient is at home.  Only the patient and myself participated in this encounter.  I discussed the limitations, risks, security and privacy concerns of performing an evaluation and management service by telephone and the availability of in person appointments. I also discussed with the patient that there may be a patient responsible charge related to this service. The patient expressed understanding and agreed to proceed.   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 Slight abdominal pain and loose stools.  He has had about 4-5 bowel movements since his symptoms first began.  Lives alone, no sick contacts over past several days, no recent travels.  Does not attribute this to anything he ate.  No sore throat.  Some muscle cramp in LT leg.    HM:  Had flu shot in Sept [facility].   Outpatient Encounter Medications as of 09/01/2019  Medication Sig  . amLODipine (NORVASC) 10 MG tablet Take 0.5 tablets (5 mg total) by mouth 2 (two) times daily.  Marland Kitchen aspirin EC 81 MG tablet Take 81 mg by mouth daily.   No facility-administered encounter medications on file as of 09/01/2019.       Observations/Objective: No direct observation done  Assessment and Plan: 1. Gastroenteritis Symptoms consistent with acute gastroenteritis likely viral. Recommend pushing fluids.  May want to try Gatorade. Brat diet discussed. Recommend purchasing some Pepto-Bismol  over-the-counter and using as needed to help settle his stomach.  Follow-up if no improvement.   Follow Up Instructions: 2 mths   I discussed the assessment and treatment plan with the patient. The patient was provided an opportunity to ask questions and all were answered. The patient agreed with the plan and demonstrated an understanding of the instructions.   The patient was advised to call back or seek an in-person evaluation if the symptoms worsen or if the condition fails to improve as anticipated.  I provided 8 minutes of non-face-to-face time during this encounter.   Karle Plumber, MD

## 2019-10-23 DIAGNOSIS — H5372 Impaired contrast sensitivity: Secondary | ICD-10-CM | POA: Diagnosis not present

## 2019-10-23 DIAGNOSIS — H5352 Acquired color vision deficiency: Secondary | ICD-10-CM | POA: Diagnosis not present

## 2019-10-23 DIAGNOSIS — R69 Illness, unspecified: Secondary | ICD-10-CM | POA: Diagnosis not present

## 2019-11-10 ENCOUNTER — Ambulatory Visit: Payer: Medicare HMO | Attending: Internal Medicine | Admitting: Internal Medicine

## 2019-11-10 ENCOUNTER — Encounter: Payer: Self-pay | Admitting: Internal Medicine

## 2019-11-10 ENCOUNTER — Other Ambulatory Visit: Payer: Self-pay

## 2019-11-10 VITALS — BP 117/74 | HR 82 | Temp 99.3°F | Resp 16 | Ht 71.0 in | Wt 175.6 lb

## 2019-11-10 DIAGNOSIS — R1110 Vomiting, unspecified: Secondary | ICD-10-CM | POA: Diagnosis not present

## 2019-11-10 DIAGNOSIS — M25521 Pain in right elbow: Secondary | ICD-10-CM | POA: Diagnosis not present

## 2019-11-10 DIAGNOSIS — G8929 Other chronic pain: Secondary | ICD-10-CM

## 2019-11-10 DIAGNOSIS — D229 Melanocytic nevi, unspecified: Secondary | ICD-10-CM | POA: Diagnosis not present

## 2019-11-10 DIAGNOSIS — Z1159 Encounter for screening for other viral diseases: Secondary | ICD-10-CM

## 2019-11-10 DIAGNOSIS — R634 Abnormal weight loss: Secondary | ICD-10-CM

## 2019-11-10 MED ORDER — PROMETHAZINE HCL 12.5 MG RE SUPP: 12.5000 mg | Freq: Three times a day (TID) | RECTAL | 2 refills | Status: DC | PRN

## 2019-11-10 NOTE — Progress Notes (Signed)
Patient ID: Luis CLASSON, male    DOB: [birth year]  MRN: [id number]  CC: Hypertension   Subjective: Luis Henderson is a 64 y.o. male who presents for chronic ds management His concerns today include:  Patient with history of HTN, tob dep, polycythemia, diverticulitis,acquired obstructive hydrocephalus status post shunt placement 08/2017, preDM  Loss about 20 lbs since last in person visit in 05/2019 Not intentional.  "Having a problem with food.  Nothing appeals to me."  Reports intermittent episodes of N/V since 04/2019 after GB removal. Had about 10 episodes of sudden N/V since July 2020.  He has had 2 episodes within the past week.  Episodes are associated with abdominal + cramps, palpitations, increase BP, diarrhea (1-2 BM), chills.  Thinks he needs to see nutrition has to help him determine what he can eat. -tries to stay hydrated by drinking a lot of water.  He has noticed occasional  dizziness when he sits up from a lying position -He denies any blood in the stools, hematuria, chronic cough, night sweats or lymphadenopathy.  Has mass on forehead few cm from where melanoma excised from face in past. Present x 1 yr.  Slowing increase in size. Wants derm in [place] [facility]  Had IV in RT wrist during hosp 04/2019 for GB surgery. Had some "discomfort" in the area.  Then discomfort migrated to medial aspect of elbow.  No swelling. No redness.  He wonders whether there is foreign body in the soft tissue of the elbow.  Wants to know whether imaging needs to be done.  Patient Active Problem List   Diagnosis Date Noted  . Bilateral inguinal hernia 08/05/2019  . Polycythemia vera ([place]) 06/09/2019  . Diverticulitis large intestine 05/04/2019  . Inguinal hernia 10/03/2017  . Polycythemia 05/31/2017  . 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 Anxiety and depression   . Chronic bilateral low back pain without sciatica   . ETOH abuse   . Tobacco abuse   . HLD (hyperlipidemia) 09/04/2016  .  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[place]) 09/04/2016  . Low back pain 06/09/2016  . HTN (hypertension) 04/01/2015     Current Outpatient Medications on File Prior to Visit  Medication Sig Dispense Refill  . amLODipine (NORVASC) 10 MG tablet Take 0.5 tablets (5 mg total) by mouth 2 (two) times daily. 30 tablet 6  . aspirin EC 81 MG tablet Take 81 mg by mouth daily.     No current facility-administered medications on file prior to visit.    Allergies  Allergen Reactions  . Codeine Itching  . Lisinopril Cough    Social History   Socioeconomic History  . Marital status: Divorced    Spouse name: Not on file  . Number of children: 2  . Years of education: Not on file  . Highest education level: Not on file  Occupational History    Employer: [id number]    Comment: Radio broadcast assistant  Tobacco Use  . Smoking status: Current Every Day Smoker    Packs/day: 1.00    Years: 28.00    Pack years: 28.00    Types: Cigarettes  . Smokeless tobacco: Never Used  Substance and Sexual Activity  . Alcohol use: Yes    Alcohol/week: 5.0 - 6.0 standard drinks    Types: 5 - 6 Cans of beer per week    Comment: daily alcohol/drinks beer, wine, and liquor  . Drug use: Yes    Types: Marijuana    Comment: 2x per week  .  Sexual activity: Not on file  Other Topics Concern  . Not on file  Social History Narrative  . Not on file   Social Determinants of Health   Financial Resource Strain:   . Difficulty of Paying Living Expenses: Not on file  Food Insecurity:   . Worried About Charity fundraiser in the Last Year: Not on file  . Ran Out of Food in the Last Year: Not on file  Transportation Needs:   . Lack of Transportation (Medical): Not on file  . Lack of Transportation (Non-Medical): Not on file  Physical Activity:   . Days of Exercise per Week: Not on file  . Minutes of Exercise per Session: Not on file  Stress:   . Feeling of Stress : Not on file  Social Connections:   . Frequency of  Communication with Friends and Family: Not on file  . Frequency of Social Gatherings with Friends and Family: Not on file  . Attends Religious Services: Not on file  . Active Member of Clubs or Organizations: Not on file  . Attends Archivist Meetings: Not on file  . Marital Status: Not on file  Intimate Partner Violence:   . Fear of Current or Ex-Partner: Not on file  . Emotionally Abused: Not on file  . Physically Abused: Not on file  . Sexually Abused: Not on file    Family History  Problem Relation Age of Onset  . Hyperlipidemia Father     Past Surgical History:  Procedure Laterality Date  . CHOLECYSTECTOMY N/A 05/06/2019   Procedure: LAPAROSCOPIC CHOLECYSTECTOMY WITH INTRAOPERATIVE CHOLANGIOGRAM;  Surgeon: Donnie Mesa, MD;  Location: [place];  Service: General;  Laterality: N/A;  . CYSTOSCOPY WITH RETROGRADE PYELOGRAM, URETEROSCOPY AND STENT PLACEMENT Right 01/15/2015   Procedure: CYSTOSCOPY, RIGHT URETEROSCOPY/ RETROGRADE PYELOGRAM,HOLMIUM LASER/ LITHOTRIPSY, STENT PLACEMENT, URETERAL BALLOON DIALATION;  Surgeon: Kathie Rhodes, MD;  Location: [facility];  Service: Urology;  Laterality: Right;  . INGUINAL HERNIA REPAIR Bilateral 08/05/2019   Procedure: OPEN BILATERAL INGUINAL HERNIA REPAIRS WITH MESH;  Surgeon: Donnie Mesa, MD;  Location: [place];  Service: General;  Laterality: Bilateral;  . INSERTION OF MESH Bilateral 08/05/2019   Procedure: INSERTION OF MESH;  Surgeon: Donnie Mesa, MD;  Location: [place];  Service: General;  Laterality: Bilateral;  . SKIN GRAFT     left hand  . VENTRICULOPERITONEAL SHUNT Right 09/04/2016   Procedure: SHUNT INSERTION VENTRICULAR-PERITONEAL;  Surgeon: Newman Pies, MD;  Location: [place];  Service: Neurosurgery;  Laterality: Right;  . WISDOM TOOTH EXTRACTION      ROS: Review of Systems Negative except as stated above  PHYSICAL EXAM: BP 117/74   Pulse 82   Temp 99.3 F (37.4 C) (Oral)    Resp 16   Ht 5\' 11"  (1.803 m)   Wt 175 lb 9.6 oz (79.7 kg)   SpO2 98%   BMI 24.49 kg/m   Wt Readings from Last 3 Encounters:  11/10/19 175 lb 9.6 oz (79.7 kg)  08/05/19 194 lb 3.6 oz (88.1 kg)  06/09/19 195 lb (88.5 kg)    Physical Exam  General appearance -patient is an older Caucasian male in NAD.  He has noted weight loss since I last saw him in person. Mental status -flat affect.  Answers questions appropriately Eyes -Pink conjunctiva Mouth - mucous membranes moist, pharynx normal without lesions Neck - supple, no significant adenopathy, no thyroid enlargement or nodules. Lymphatics -no cervical or axillary lymphadenopathy.   Chest -  clear to auscultation, no wheezes, rales or rhonchi, symmetric air entry Heart - normal rate, regular rhythm, normal S1, S2, no murmurs, rubs, clicks or gallops Abdomen - soft, nontender, nondistended, no masses or organomegaly Extremities -no lower extremity edema.  Peripheral pulses are intact.   Skin -he has a port wine less than half a centimeter raised lesion above the medial aspect of the right brow No signs of inflammation noted of the veins in the right forearm. MSK: Right wrist -no edema or erythema.  No point tenderness.  Good range of motion. Right elbow: No edema or erythema.  Very slight tenderness on palpation of the soft tissue inferior to the medial epicondyles.  No abnormal masses palpated  CMP Latest Ref Rng & Units 05/07/2019 05/06/2019 05/03/2019  Glucose 70 - 99 mg/dL 123(H) 101(H) 142(H)  BUN 8 - 23 mg/dL 12 10 13   Creatinine 0.61 - 1.24 mg/dL 1.11 1.02 1.09  Sodium 135 - 145 mmol/L 140 139 138  Potassium 3.5 - 5.1 mmol/L 4.1 3.9 3.6  Chloride 98 - 111 mmol/L 102 106 107  CO2 22 - 32 mmol/L 25 23 18(L)  Calcium 8.9 - 10.3 mg/dL 9.2 9.0 9.3  Total Protein 6.5 - 8.1 g/dL 5.8(L) - -  Total Bilirubin 0.3 - 1.2 mg/dL 0.8 - -  Alkaline Phos 38 - 126 U/L 63 - -  AST 15 - 41 U/L 82(H) - -  ALT 0 - 44 U/L 45(H) - -   Lipid Panel       Component Value Date/Time   CHOL 178 04/14/2016 1002   TRIG 140 04/14/2016 1002   HDL 47 04/14/2016 1002   CHOLHDL 3.8 04/14/2016 1002   VLDL 28 04/14/2016 1002   LDLCALC 103 04/14/2016 1002    CBC    Component Value Date/Time   WBC 10.0 05/07/2019 0551   RBC 6.03 (H) 05/07/2019 0551   HGB 17.7 (H) 05/07/2019 0551   HGB 18.5 (H) 11/16/2017 1209   HGB 18.8 (H) 05/09/2017 1554   HCT 52.3 (H) 05/07/2019 0551   HCT 54.9 (H) 05/09/2017 1554   PLT 310 05/07/2019 0551   PLT 309 11/16/2017 1209   PLT 281 05/09/2017 1554   MCV 86.7 05/07/2019 0551   MCV 87 05/09/2017 1554   MCH 29.4 05/07/2019 0551   MCHC 33.8 05/07/2019 0551   RDW 13.3 05/07/2019 0551   RDW 15.3 05/09/2017 1554   LYMPHSABS 1.2 05/03/2019 1148   LYMPHSABS 2.5 05/09/2017 1554   MONOABS 0.9 05/03/2019 1148   EOSABS 0.2 05/03/2019 1148   EOSABS 0.3 05/09/2017 1554   BASOSABS 0.1 05/03/2019 1148   BASOSABS 0.1 05/09/2017 1554    ASSESSMENT AND PLAN:  1. Unexplained weight loss 2. Recurrent vomiting -Questionable etiology.  We will start with baseline blood tests including TSH, screen for diabetes, chemistry and CBC.  Patient declined HIV screening stating that he has not had sex in 64 years. -I will refer him to gastroenterology. -I have prescribed some rectal Phenergan to use as needed for the nausea/vomiting Recommend purchasing some Ensure or boost shakes to supplement meals. - Hemoglobin A1c - TSH - Ambulatory referral to Gastroenterology - Comprehensive metabolic panel - CBC   3. Enlarged skin mole This looks more like a borderline hemangioma.  However given history of melanoma, will refer to dermatology - Ambulatory referral to Dermatology  4. Elbow pain, chronic, right Reassurance given here.  Even though IVs are started with needle over a catheter, the catheter that remains in  her skin is not metalic so I doubt the needle floated off into the vein.  5. Need for hepatitis C screening test -  Hepatitis C Antibody   Patient was given the opportunity to ask questions.  Patient verbalized understanding of the plan and was able to repeat key elements of the plan.   No orders of the defined types were placed in this encounter.    Requested Prescriptions    No prescriptions requested or ordered in this encounter    F/u in 6-7 wks Karle Plumber, MD, Rosalita Chessman

## 2019-11-10 NOTE — Patient Instructions (Addendum)
You have been referred to a gastroenterologist and a dermatologist. I have sent a prescription to your pharmacy for medication called Phenergan to use as needed for the nausea and vomiting episodes.  Try purchasing and using Ensure or Boost shakes to supplement meals.

## 2019-11-11 ENCOUNTER — Encounter: Payer: Self-pay | Admitting: Internal Medicine

## 2019-11-11 ENCOUNTER — Telehealth: Payer: Self-pay | Admitting: Internal Medicine

## 2019-11-11 DIAGNOSIS — D751 Secondary polycythemia: Secondary | ICD-10-CM

## 2019-11-11 DIAGNOSIS — H43393 Other vitreous opacities, bilateral: Secondary | ICD-10-CM

## 2019-11-11 LAB — COMPREHENSIVE METABOLIC PANEL
ALT: 19 IU/L (ref 0–44)
AST: 22 IU/L (ref 0–40)
Albumin/Globulin Ratio: 2.2 (ref 1.2–2.2)
Albumin: 4.9 g/dL — ABNORMAL HIGH (ref 3.8–4.8)
Alkaline Phosphatase: 89 IU/L (ref 39–117)
BUN/Creatinine Ratio: 18 (ref 10–24)
BUN: 18 mg/dL (ref 8–27)
Bilirubin Total: 0.5 mg/dL (ref 0.0–1.2)
CO2: 24 mmol/L (ref 20–29)
Calcium: 10.6 mg/dL — ABNORMAL HIGH (ref 8.6–10.2)
Chloride: 100 mmol/L (ref 96–106)
Creatinine, Ser: 0.99 mg/dL (ref 0.76–1.27)
GFR calc Af Amer: 93 mL/min/{1.73_m2} (ref >59)
GFR calc non Af Amer: 80 mL/min/{1.73_m2} (ref >59)
Globulin, Total: 2.2 g/dL (ref 1.5–4.5)
Glucose: 154 mg/dL — ABNORMAL HIGH (ref 65–99)
Potassium: 5 mmol/L (ref 3.5–5.2)
Sodium: 143 mmol/L (ref 134–144)
Total Protein: 7.1 g/dL (ref 6.0–8.5)

## 2019-11-11 LAB — CBC
Hematocrit: 59 % — ABNORMAL HIGH (ref 37.5–51.0)
Hemoglobin: 20.3 g/dL (ref 13.0–17.7)
MCH: 29.7 pg (ref 26.6–33.0)
MCHC: 34.4 g/dL (ref 31.5–35.7)
MCV: 86 fL (ref 79–97)
Platelets: 315 10*3/uL (ref 150–450)
RBC: 6.84 x10E6/uL — ABNORMAL HIGH (ref 4.14–5.80)
RDW: 17.6 % — ABNORMAL HIGH (ref 11.6–15.4)
WBC: 9.5 10*3/uL (ref 3.4–10.8)

## 2019-11-11 LAB — HEMOGLOBIN A1C
Est. average glucose Bld gHb Est-mCnc: 123 mg/dL
Hgb A1c MFr Bld: 5.9 % — ABNORMAL HIGH (ref 4.8–5.6)

## 2019-11-11 LAB — TSH: TSH: 1.44 u[IU]/mL (ref 0.450–4.500)

## 2019-11-11 LAB — HEPATITIS C ANTIBODY: Hep C Virus Ab: <0.1 s/co ratio (ref 0.0–0.9)

## 2019-11-11 NOTE — Telephone Encounter (Signed)
Phone call placed to patient today to go over lab results.  The polycythemia is worse with hemoglobin of 20 and hematocrit of 59.  I have referred him to hematologist in August of last year.  He had seen Dr. Alen Blew in the past but does not wish to see him again.  The referral however was still sent to Dr. Hazeline Junker office.  I told patient that I will resubmit this referral and have our referral coordinator try to get him an appointment as soon as possible with a different hematologist/oncologist. -TSH and hepatitis C screening were negative. -New elevated hypercalcemia.  I requested that he return to the lab for additional blood test to work this up further.  He is not on calcium or vitamin D supplement. -Still has prediabetes. -He tells me that he has been seen spirochetes in his visual fields.  He does not think that they are floaters.  I will refer him to an ophthalmologist.

## 2019-11-12 ENCOUNTER — Encounter: Payer: Self-pay | Admitting: Gastroenterology

## 2019-11-12 ENCOUNTER — Ambulatory Visit: Payer: Medicare HMO | Admitting: Gastroenterology

## 2019-11-12 ENCOUNTER — Ambulatory Visit: Payer: Medicare HMO | Attending: Internal Medicine

## 2019-11-12 ENCOUNTER — Other Ambulatory Visit: Payer: Self-pay

## 2019-11-12 VITALS — BP 138/78 | HR 64 | Temp 97.6°F | Ht 71.0 in | Wt 179.5 lb

## 2019-11-12 DIAGNOSIS — R112 Nausea with vomiting, unspecified: Secondary | ICD-10-CM | POA: Diagnosis not present

## 2019-11-12 DIAGNOSIS — R634 Abnormal weight loss: Secondary | ICD-10-CM

## 2019-11-12 DIAGNOSIS — Z1211 Encounter for screening for malignant neoplasm of colon: Secondary | ICD-10-CM

## 2019-11-12 DIAGNOSIS — R194 Change in bowel habit: Secondary | ICD-10-CM | POA: Diagnosis not present

## 2019-11-12 DIAGNOSIS — R630 Anorexia: Secondary | ICD-10-CM

## 2019-11-12 DIAGNOSIS — Z01818 Encounter for other preprocedural examination: Secondary | ICD-10-CM | POA: Diagnosis not present

## 2019-11-12 MED ORDER — METOCLOPRAMIDE HCL 5 MG PO TABS: ORAL_TABLET | ORAL | 0 refills | Status: DC

## 2019-11-12 NOTE — Patient Instructions (Signed)
If you are age 65 or older, your body mass index should be between 23-30. Your Body mass index is 25.04 kg/m. If this is out of the aforementioned range listed, please consider follow up with your Primary Care Provider.  If you are age 96 or younger, your body mass index should be between 19-25. Your Body mass index is 25.04 kg/m. If this is out of the aformentioned range listed, please consider follow up with your Primary Care Provider.   You have been scheduled for an endoscopy and colonoscopy. Please follow the written instructions given to you at your visit today. Please pick up your prep supplies at the pharmacy within the next 1-3 days. If you use inhalers (even only as needed), please bring them with you on the day of your procedure. Your physician has requested that you go to www.startemmi.com and enter the access code given to you at your visit today. This web site gives a general overview about your procedure. However, you should still follow specific instructions given to you by our office regarding your preparation for the procedure.  It was a pleasure to see you today!  Dr. Loletha Carrow

## 2019-11-12 NOTE — Progress Notes (Signed)
Luis Henderson:  History: Luis Henderson 11/12/2019  Referring provider: Ladell Pier, MD  Reason for consult/chief complaint: Weight Loss (occ diarrhea, last colonoscopy 15 years ago) and Nausea (1 week ago today episode of nausea and vomitting for 6-8 hours)   Subjective  HPI: Patient was referred by primary care after an office visit with them earlier this week, complaining of about 6 months of intermittent sudden onset nausea and vomiting seems to have occurred since his cholecystectomy last year.  He was noted to be down about 20 pounds since August. Operative report was reviewed, patient had chronic cholecystitis, uncomplicated surgery and postop course.  Bilateral inguinal hernia repairs in October.  Luis Henderson reports that he had many years of gallbladder symptoms before the eventual diagnosis and surgery last year.  He therefore feels that he became accustomed to many food avoidances so as not to have symptoms.  It seems to him like it has been a long journey back to find foods that he can eat since then.  His appetite has been slow to return since the surgery, but improving in the last month or so.  He has been experimenting with new high-protein foods that might agree with him.  However, he has having episodes of acute onset severe nausea and vomiting that may last an entire day.  He can suppositories prescribed by primary care, he has not yet needed them, his last episode was about a week ago.  His bowel habits are a little more frequent her stools soft since the cholecystectomy and with some new foods he is introduced.  He does not get loose watery stool, and has no rectal bleeding.  He believes his last colonoscopy was about 15 years ago, and it may have been a small polyp.   ROS:  Review of Systems  Constitutional: Negative for appetite change and unexpected weight change.  HENT: Negative for mouth sores and voice change.   Eyes: Negative  for pain and redness.  Respiratory: Negative for cough and shortness of breath.   Cardiovascular: Negative for chest pain and palpitations.  Genitourinary: Negative for dysuria and hematuria.  Musculoskeletal: Negative for arthralgias and myalgias.  Skin: Negative for pallor and rash.  Neurological: Negative for weakness and headaches.  Hematological: Negative for adenopathy.   Recent dental pain and underwent multiple extractions.  Past Medical History: Past Medical History:  Diagnosis Date  . Anxiety   . Depression   . Diverticulitis 04/2019  . History of kidney stones   . History of prediabetes   . Hyperlipidemia   . Hypertension   . Polycythemia      Past Surgical History: Past Surgical History:  Procedure Laterality Date  . CHOLECYSTECTOMY N/A 05/06/2019   Procedure: LAPAROSCOPIC CHOLECYSTECTOMY WITH INTRAOPERATIVE CHOLANGIOGRAM;  Surgeon: Donnie Mesa, MD;  Location: [facility];  Service: General;  Laterality: N/A;  . COLONOSCOPY    . CYSTOSCOPY WITH RETROGRADE PYELOGRAM, URETEROSCOPY AND STENT PLACEMENT Right 01/15/2015   Procedure: CYSTOSCOPY, RIGHT URETEROSCOPY/ RETROGRADE PYELOGRAM,HOLMIUM LASER/ LITHOTRIPSY, STENT PLACEMENT, URETERAL BALLOON DIALATION;  Surgeon: Kathie Rhodes, MD;  Location: [facility];  Service: Urology;  Laterality: Right;  . INGUINAL HERNIA REPAIR Bilateral 08/05/2019   Procedure: OPEN BILATERAL INGUINAL HERNIA REPAIRS WITH MESH;  Surgeon: Donnie Mesa, MD;  Location: [facility];  Service: General;  Laterality: Bilateral;  . INSERTION OF MESH Bilateral 08/05/2019   Procedure: INSERTION OF MESH;  Surgeon: Donnie Mesa, MD;  Location: [facility];  Service: General;  Laterality: Bilateral;  . SKIN GRAFT     left hand  . VENTRICULOPERITONEAL SHUNT Right 09/04/2016   Procedure: SHUNT INSERTION VENTRICULAR-PERITONEAL;  Surgeon: Newman Pies, MD;  Location: [place];  Service: Neurosurgery;  Laterality: Right;  . WISDOM TOOTH  EXTRACTION       Family History: Family History  Problem Relation Age of Onset  . Hyperlipidemia Father   . Colon cancer Neg Hx   . Esophageal cancer Neg Hx   . Stomach cancer Neg Hx   . Pancreatic cancer Neg Hx     Social History: Social History   Socioeconomic History  . Marital status: Divorced    Spouse name: Not on file  . Number of children: 2  . Years of education: Not on file  . Highest education level: Not on file  Occupational History    Employer: [id number]    Comment: Radio broadcast assistant  Tobacco Use  . Smoking status: Current Every Day Smoker    Packs/day: 1.00    Years: 28.00    Pack years: 28.00    Types: Cigarettes  . Smokeless tobacco: Never Used  Substance and Sexual Activity  . Alcohol use: Yes    Alcohol/week: 2.0 - 3.0 standard drinks    Types: 2 - 3 Cans of beer per week    Comment: daily alcohol use/drinks beer, wine, and liquor  . Drug use: Yes    Types: Marijuana    Comment: 2x per week  . Sexual activity: Not on file  Other Topics Concern  . Not on file  Social History Narrative  . Not on file   Social Determinants of Health   Financial Resource Strain:   . Difficulty of Paying Living Expenses: Not on file  Food Insecurity:   . Worried About Charity fundraiser in the Last Year: Not on file  . Ran Out of Food in the Last Year: Not on file  Transportation Needs:   . Lack of Transportation (Medical): Not on file  . Lack of Transportation (Non-Medical): Not on file  Physical Activity:   . Days of Exercise per Week: Not on file  . Minutes of Exercise per Session: Not on file  Stress:   . Feeling of Stress : Not on file  Social Connections:   . Frequency of Communication with Friends and Family: Not on file  . Frequency of Social Gatherings with Friends and Family: Not on file  . Attends Religious Services: Not on file  . Active Member of Clubs or Organizations: Not on file  . Attends Archivist Meetings: Not on file  .  Marital Status: Not on file    Allergies: Allergies  Allergen Reactions  . Codeine Itching  . Lisinopril Cough    Outpatient Meds: Current Outpatient Medications  Medication Sig Dispense Refill  . amLODipine (NORVASC) 10 MG tablet Take 0.5 tablets (5 mg total) by mouth 2 (two) times daily. 30 tablet 6  . aspirin EC 81 MG tablet Take 81 mg by mouth daily.    . promethazine (PHENERGAN) 12.5 MG suppository Place 1 suppository (12.5 mg total) rectally every 8 (eight) hours as needed for nausea or vomiting. 12 each 2   No current facility-administered medications for this visit.      ___________________________________________________________________ Objective   Exam:  BP 138/78 (BP Location: Left Arm, Patient Position: Sitting, Cuff Size: Normal)   Pulse 64   Temp 97.6 F (36.4 C)   Ht 5\' 11"  (1.803 m)   Wt  179 lb 8 oz (81.4 kg)   SpO2 99%   BMI 25.04 kg/m    General: Poor muscle mass.  Not acutely ill-appearing.  Eyes: sclera anicteric, no redness  ENT: oral mucosa moist without lesions, no cervical or supraclavicular lymphadenopathy.  Poor dentition, nothing loose.  CV: RRR without murmur, S1/S2, no JVD, no peripheral edema  Resp: clear to auscultation bilaterally, normal RR and effort noted  GI: soft, no tenderness, with active bowel sounds. No guarding or palpable organomegaly noted.  Skin; warm and dry, no rash or jaundice noted  Neuro: awake, alert and oriented x 3. Normal gross motor function and fluent speech  Labs:  CBC Latest Ref Rng & Units 11/10/2019 05/07/2019 05/06/2019  WBC 3.4 - 10.8 x10E3/uL 9.5 10.0 8.8  Hemoglobin 13.0 - 17.7 g/dL 20.3(HH) 17.7(H) 18.0(H)  Hematocrit 37.5 - 51.0 % 59.0(H) 52.3(H) 54.1(H)  Platelets 150 - 450 x10E3/uL 315 310 300    Polycythemia was evaluated by hematology consultant in 2019.   CMP Latest Ref Rng & Units 11/10/2019 05/07/2019 05/06/2019  Glucose 65 - 99 mg/dL 154(H) 123(H) 101(H)  BUN 8 - 27 mg/dL 18 12 10     Creatinine 0.76 - 1.27 mg/dL 0.99 1.11 1.02  Sodium 134 - 144 mmol/L 143 140 139  Potassium 3.5 - 5.2 mmol/L 5.0 4.1 3.9  Chloride 96 - 106 mmol/L 100 102 106  CO2 20 - 29 mmol/L 24 25 23   Calcium 8.6 - 10.2 mg/dL 10.6(H) 9.2 9.0  Total Protein 6.0 - 8.5 g/dL 7.1 5.8(L) -  Total Bilirubin 0.0 - 1.2 mg/dL 0.5 0.8 -  Alkaline Phos 39 - 117 IU/L 89 63 -  AST 0 - 40 IU/L 22 82(H) -  ALT 0 - 44 IU/L 19 45(H) -     Radiologic Studies: CLINICAL DATA:  64 year old male with abdominal pain, constipation.   EXAM: CT ABDOMEN AND PELVIS WITH CONTRAST   TECHNIQUE: Multidetector CT imaging of the abdomen and pelvis was performed using the standard protocol following bolus administration of intravenous contrast.   CONTRAST:  161mL OMNIPAQUE IOHEXOL 300 MG/ML  SOLN   COMPARISON:  CT Abdomen and Pelvis 01/13/2015. CTA chest 09/29/2017.   FINDINGS: Lower chest: Negative aside from small hiatal hernia which was apparent in 2018.   Hepatobiliary: Abnormal gallbladder is indistinct and hyperenhancing with regional small volume fluid or edema. See series 3, image 20. However, the gallbladder is nondistended. Gallbladder might of been similarly abnormal in 2018 (with wall thickening by ultrasound at that time), but appeared normal in 2016. There is no bile duct enlargement. Liver enhancement remains stable, normal.   Pancreas: Negative.   Spleen: Negative.   Adrenals/Urinary Tract: Large 9 centimeter exophytic left renal cyst with simple fluid density. Right nephrolithiasis, 7 millimeters. Punctate left nephrolithiasis. Symmetric renal enhancement and contrast excretion with normal proximal ureters.   Unremarkable urinary bladder.   Stomach/Bowel: Negative rectum. Mild to moderate inflammatory stranding along the left lateral margin of the sigmoid colon on series 3, image 63 where a 15 millimeter sigmoid diverticula is noted. Mild if any associated regional sigmoid wall  thickening. Diverticulosis continues proximally to the right colon with no other active inflammation. Normal retrocecal appendix. Negative terminal ileum. No dilated small bowel. There is wiring or catheter tubing which tracks into the ventral upper abdomen from the right lower chest and is stable since 2018. Negative intraabdominal stomach. Small duodenal diverticulum on series 3, image 25. No free air. No free fluid.   Vascular/Lymphatic: Extensive Aortoiliac  calcified atherosclerosis. Major arterial structures are patent. Portal venous system is patent.   No lymphadenopathy, including at the porta hepatis.   Reproductive: Chronic fat containing inguinal hernias are stable. Otherwise negative.   Other: No pelvic free fluid.   Musculoskeletal: Stable grade 1 anterolisthesis at L4-L5 with severe lower lumbar facet degeneration. No acute osseous abnormality identified.   IMPRESSION: 1. Mild acute inflammation associated with mid sigmoid colon diverticula compatible focal diverticulitis. No complicating features. Widespread underlying large bowel diverticulosis. 2. Abnormal gallbladder which appears highly thickened and irregular, but may not be significantly changed since 2018. Considering the nonspecific ultrasound appearance of the gallbladder at that time, a follow-up Abdomen MRI (liver protocol without and with contrast) might best evaluate further. 3. Small gastric hernia. Nephrolithiasis. Aortic Atherosclerosis (ICD10-I70.0).     Electronically Signed   By: Genevie Ann M.D.   On: 04/30/2019 03:09    Assessment: Encounter Diagnoses  Name Primary?  . Nausea and vomiting in adult Yes  . Abnormal loss of weight   . Loss of appetite   . Change in bowel habits   . Special screening for malignant neoplasms, colon     Episodic severe nausea and vomiting, anorexia weight loss, which do not seem like expected symptoms from recovery after cholecystectomy. Change in bowel  habits sounds likely benign related to cholecystectomy and some dietary changes. Many years since colon cancer screening. Plan:  Recommended upper endoscopy to investigate his symptoms, and a screening colonoscopy.  He was agreeable after discussion of procedure and risks.  The benefits and risks of the planned procedure were described in detail with the patient or (when appropriate) their health care proxy.  Risks were outlined as including, but not limited to, bleeding, infection, perforation, adverse medication reaction leading to cardiac or pulmonary decompensation, pancreatitis (if ERCP).  The limitation of incomplete mucosal visualization was also discussed.  No guarantees or warranties were given.   Thank you for the courtesy of this consult.  Please call me with any questions or concerns.  Nelida Meuse III  CC: Referring provider noted above

## 2019-11-17 DIAGNOSIS — D485 Neoplasm of uncertain behavior of skin: Secondary | ICD-10-CM | POA: Diagnosis not present

## 2019-11-17 DIAGNOSIS — D1801 Hemangioma of skin and subcutaneous tissue: Secondary | ICD-10-CM | POA: Diagnosis not present

## 2019-11-20 LAB — PTH, INTACT AND CALCIUM
Calcium: 9.7 mg/dL (ref 8.6–10.2)
PTH: 28 pg/mL (ref 15–65)

## 2019-11-20 LAB — VITAMIN D 25 HYDROXY (VIT D DEFICIENCY, FRACTURES): Vit D, 25-Hydroxy: 35 ng/mL (ref 30.0–100.0)

## 2019-11-20 LAB — PTH-RELATED PEPTIDE: PTH-related peptide: <2 pmol/L

## 2019-11-21 ENCOUNTER — Telehealth: Payer: Self-pay | Admitting: Hematology and Oncology

## 2019-11-21 NOTE — Telephone Encounter (Signed)
A new hem appt has been scheduled for [name] to establish care w/a new hematologist for polycythemia. He's been scheduled to see Dr. Lorenso Courier on 2/11 at [facility]. Referring office will notify the pt.

## 2019-11-25 NOTE — Progress Notes (Signed)
[facility] Telephone:([phone]   [phone]  PROGRESS NOTE  Patient Care Team: Ladell Pier, MD as PCP - General (Internal Medicine)  Hematological/Oncological History # Polycythemia, Secondary   1) 08/24/2012: WBC 7.0, Hgb 17.2, Plt 228, MCV 91. First CBC on record.   2) 01/11/2015: WBC 12.2, Hgb 18.4, Plt 250, MCV 88.2.   3) 05/07/2017: Hgb 21.2, HCT 62  4)  11/16/2017: genetic testing was JAK2, MPL, and CALR negative. WBC 11.1, Hgb 18.2, Plt 427. Erythropoietin 3.5.   5) 11/10/2019: WBC 9.5, Hgb 20.3, MCV 86, Plt 315 6) 11/27/2019: Establish care with Dr. Lorenso Courier   Interval History:  Luis Henderson 64 y.o. male with medical history significant for secondary polycythemia presents for a follow up visit. The patient's last visit was on 11/16/2017 with Dr. Alen Blew. In the interim since the last visit he has requested to establish with a new provider.  On review of his prior records he was noted to have elevated hemoglobin in July 2018 at that time his hemoglobin was noted to be 18.8 with a normal white blood cell count.  His CBC on September 28, 2017 showed a hemoglobin of 19.7 white blood cell count of 15.4 and a platelet count of 362.  His hemoglobin has fluctuated between normal range and high levels since 2013.  Due to concern for his polycythemia he was referred to hematology for further evaluation and management, where he was seen by Dr. Alen Blew on 11/16/2017.  At that time he had genetic testing done which showed JAK2, MPL, and CALR were all negative.  On exam today Luis Henderson notes that he feels well.  He reports that he has had no issues with shortness of breath, chest pain, nausea vomiting or diarrhea.  He reports that he does not have any itching of the skin and has not noticed any facial plethora.  Additionally he has had no swelling of the lower extremities or chest pain concerning for VTE.  He reports that he continues to smoke and that he does not have  any intention of discontinuing.  A full 10 point ROS is listed below.  MEDICAL HISTORY:  Past Medical History:  Diagnosis Date  . Anxiety   . Depression   . Diverticulitis 04/2019  . History of kidney stones   . History of prediabetes   . Hyperlipidemia   . Hypertension   . Polycythemia     SURGICAL HISTORY: Past Surgical History:  Procedure Laterality Date  . CHOLECYSTECTOMY N/A 05/06/2019   Procedure: LAPAROSCOPIC CHOLECYSTECTOMY WITH INTRAOPERATIVE CHOLANGIOGRAM;  Surgeon: Donnie Mesa, MD;  Location: [facility];  Service: General;  Laterality: N/A;  . COLONOSCOPY    . CYSTOSCOPY WITH RETROGRADE PYELOGRAM, URETEROSCOPY AND STENT PLACEMENT Right 01/15/2015   Procedure: CYSTOSCOPY, RIGHT URETEROSCOPY/ RETROGRADE PYELOGRAM,HOLMIUM LASER/ LITHOTRIPSY, STENT PLACEMENT, URETERAL BALLOON DIALATION;  Surgeon: Kathie Rhodes, MD;  Location: [facility];  Service: Urology;  Laterality: Right;  . INGUINAL HERNIA REPAIR Bilateral 08/05/2019   Procedure: OPEN BILATERAL INGUINAL HERNIA REPAIRS WITH MESH;  Surgeon: Donnie Mesa, MD;  Location: [facility];  Service: General;  Laterality: Bilateral;  . INSERTION OF MESH Bilateral 08/05/2019   Procedure: INSERTION OF MESH;  Surgeon: Donnie Mesa, MD;  Location: [facility];  Service: General;  Laterality: Bilateral;  . SKIN GRAFT     left hand  . VENTRICULOPERITONEAL SHUNT Right 09/04/2016   Procedure: SHUNT INSERTION VENTRICULAR-PERITONEAL;  Surgeon: Newman Pies, MD;  Location: [facility];  Service: Neurosurgery;  Laterality: Right;  . WISDOM TOOTH EXTRACTION      SOCIAL HISTORY: Social History   Socioeconomic History  . Marital status: Divorced    Spouse name: Not on file  . Number of children: 2  . Years of education: Not on file  . Highest education level: Not on file  Occupational History    Employer: [id number]    Comment: Radio broadcast assistant  Tobacco Use  . Smoking status: Current Every Day Smoker    Packs/day:  1.00    Years: 28.00    Pack years: 28.00    Types: Cigarettes  . Smokeless tobacco: Never Used  Substance and Sexual Activity  . Alcohol use: Yes    Alcohol/week: 2.0 - 3.0 standard drinks    Types: 2 - 3 Cans of beer per week    Comment: daily alcohol use/drinks beer, wine, and liquor  . Drug use: Yes    Types: Marijuana    Comment: 2x per week  . Sexual activity: Not on file  Other Topics Concern  . Not on file  Social History Narrative  . Not on file   Social Determinants of Health   Financial Resource Strain:   . Difficulty of Paying Living Expenses: Not on file  Food Insecurity:   . Worried About Charity fundraiser in the Last Year: Not on file  . Ran Out of Food in the Last Year: Not on file  Transportation Needs:   . Lack of Transportation (Medical): Not on file  . Lack of Transportation (Non-Medical): Not on file  Physical Activity:   . Days of Exercise per Week: Not on file  . Minutes of Exercise per Session: Not on file  Stress:   . Feeling of Stress : Not on file  Social Connections:   . Frequency of Communication with Friends and Family: Not on file  . Frequency of Social Gatherings with Friends and Family: Not on file  . Attends Religious Services: Not on file  . Active Member of Clubs or Organizations: Not on file  . Attends Archivist Meetings: Not on file  . Marital Status: Not on file  Intimate Partner Violence:   . Fear of Current or Ex-Partner: Not on file  . Emotionally Abused: Not on file  . Physically Abused: Not on file  . Sexually Abused: Not on file    FAMILY HISTORY: Family History  Problem Relation Age of Onset  . Hyperlipidemia Father   . Colon cancer Neg Hx   . Esophageal cancer Neg Hx   . Stomach cancer Neg Hx   . Pancreatic cancer Neg Hx     ALLERGIES:  is allergic to codeine and lisinopril.  MEDICATIONS:  Current Outpatient Medications  Medication Sig Dispense Refill  . amLODipine (NORVASC) 10 MG tablet Take  0.5 tablets (5 mg total) by mouth 2 (two) times daily. 30 tablet 6  . aspirin EC 81 MG tablet Take 81 mg by mouth daily.    . metoCLOPramide (REGLAN) 5 MG tablet Take 1 tablet, 1 hour prior to drinking your bowel prep on 12-08-2019 and 12-09-2019 2 tablet 0  . promethazine (PHENERGAN) 12.5 MG suppository Place 1 suppository (12.5 mg total) rectally every 8 (eight) hours as needed for nausea or vomiting. 12 each 2   No current facility-administered medications for this visit.    REVIEW OF SYSTEMS:   Constitutional: ( - ) fevers, ( - )  chills , ( - ) night sweats Eyes: ( - )  blurriness of vision, ( - ) double vision, ( - ) watery eyes Ears, nose, mouth, throat, and face: ( - ) mucositis, ( - ) sore throat Respiratory: ( - ) cough, ( - ) dyspnea, ( - ) wheezes Cardiovascular: ( - ) palpitation, ( - ) chest discomfort, ( - ) lower extremity swelling Gastrointestinal:  ( - ) nausea, ( - ) heartburn, ( - ) change in bowel habits Skin: ( - ) abnormal skin rashes Lymphatics: ( - ) new lymphadenopathy, ( - ) easy bruising Neurological: ( - ) numbness, ( - ) tingling, ( - ) new weaknesses Behavioral/Psych: ( - ) mood change, ( - ) new changes  All other systems were reviewed with the patient and are negative.  PHYSICAL EXAMINATION: ECOG PERFORMANCE STATUS: 0 - Asymptomatic  Vitals:   11/27/19 0909  BP: 137/80  Pulse: 94  Resp: 18  Temp: 98 F (36.7 C)  SpO2: 99%   Filed Weights   11/27/19 0909  Weight: 175 lb 4.8 oz (79.5 kg)    GENERAL: well appearing elderly Caucasian male. alert, no distress and comfortable SKIN: skin color, texture, turgor are normal, no rashes or significant lesions EYES: conjunctiva are pink and non-injected, sclera clear LUNGS: clear to auscultation and percussion with normal breathing effort HEART: regular rate & rhythm and no murmurs and no lower extremity edema ABDOMEN: soft, non-tender, non-distended, normal bowel sounds Musculoskeletal: no cyanosis of  digits and no clubbing  PSYCH: alert & oriented x 3, fluent speech NEURO: no focal motor/sensory deficits  LABORATORY DATA:  I have reviewed the data as listed CBC Latest Ref Rng & Units 11/10/2019 05/07/2019 05/06/2019  WBC 3.4 - 10.8 x10E3/uL 9.5 10.0 8.8  Hemoglobin 13.0 - 17.7 g/dL 20.3(HH) 17.7(H) 18.0(H)  Hematocrit 37.5 - 51.0 % 59.0(H) 52.3(H) 54.1(H)  Platelets 150 - 450 x10E3/uL 315 310 300    CMP Latest Ref Rng & Units 11/12/2019 11/10/2019 05/07/2019  Glucose 65 - 99 mg/dL - 154(H) 123(H)  BUN 8 - 27 mg/dL - 18 12  Creatinine 0.76 - 1.27 mg/dL - 0.99 1.11  Sodium 134 - 144 mmol/L - 143 140  Potassium 3.5 - 5.2 mmol/L - 5.0 4.1  Chloride 96 - 106 mmol/L - 100 102  CO2 20 - 29 mmol/L - 24 25  Calcium 8.6 - 10.2 mg/dL 9.7 10.6(H) 9.2  Total Protein 6.0 - 8.5 g/dL - 7.1 5.8(L)  Total Bilirubin 0.0 - 1.2 mg/dL - 0.5 0.8  Alkaline Phos 39 - 117 IU/L - 89 63  AST 0 - 40 IU/L - 22 82(H)  ALT 0 - 44 IU/L - 19 45(H)    BLOOD FILM: No bloodwork drawn today.   RADIOGRAPHIC STUDIES: None relevant to review.  No results found.  ASSESSMENT & PLAN Luis Henderson 64 y.o. male with medical history significant for secondary polycythemia presents for a follow up visit.  After review of the previous labs ordered by Dr. Alen Blew and review of the patient's prior medical history his findings are most consistent with a secondary polycythemia secondary to smoking.  On discussion today he did not exhibit any symptoms consistent with obstructive sleep apnea.  Additionally he does not wish to undergo a sleep study evaluation.  The lack of any JAK2, MPL, or CAL R mutations is reassuring that he does not have a primary bone marrow process.    At this time our recommendation would be for discontinuation of smoking and continued monitoring of his hemoglobin level.  We will have the patient return to clinic in approximately 6 months time to reevaluate and assure that there has not been any change in  his blood counts.  In the event the patient were to develop cytopenias, or worsening erythrocytosis please let our clinic know we will schedule him at an earlier time.  As for now the only therapy would recommend is daily aspirin for thromboprophylaxis.  # Polycythemia, Secondary  --today will recheck CBC, CMP, and erythropoeitin levels --prior testing negative for JAK2, MPL, and CALR. BCR/ABL can be considered, but it is a less likely an etiology given his normal WBC.  --no indication for phlebotomy or cytoreductive therapy as this is secondary polycythemia --continue to the use of ASA 81mg  daily for thromboprophylaxis/cardiac protection --we did consider OSA evaluation with sleep study, however Luis Henderson is displaying no classic symptoms and does not have the typical body habitus. Additionally he notes he is not interested.   --RTC in 6 months time to reassess.   No orders of the defined types were placed in this encounter.   All questions were answered. The patient knows to call the clinic with any problems, questions or concerns.  A total of more than 40 minutes were spent on this encounter and over half of that time was spent on counseling and coordination of care as outlined above.   Luis Peoples, MD Department of Hematology/Oncology [place] [facility] Phone: [phone] Pager: [phone] Email: [email or web address]  11/30/2019 12:54 PM

## 2019-11-27 ENCOUNTER — Inpatient Hospital Stay: Payer: Medicare HMO

## 2019-11-27 ENCOUNTER — Other Ambulatory Visit: Payer: Self-pay

## 2019-11-27 ENCOUNTER — Inpatient Hospital Stay: Payer: Medicare HMO | Attending: Hematology and Oncology | Admitting: Hematology and Oncology

## 2019-11-27 VITALS — BP 137/80 | HR 94 | Temp 98.0°F | Resp 18 | Ht 71.0 in | Wt 175.3 lb

## 2019-11-27 DIAGNOSIS — D751 Secondary polycythemia: Secondary | ICD-10-CM | POA: Insufficient documentation

## 2019-11-27 DIAGNOSIS — Z720 Tobacco use: Secondary | ICD-10-CM

## 2019-11-27 DIAGNOSIS — Z7982 Long term (current) use of aspirin: Secondary | ICD-10-CM | POA: Insufficient documentation

## 2019-11-27 DIAGNOSIS — E785 Hyperlipidemia, unspecified: Secondary | ICD-10-CM | POA: Insufficient documentation

## 2019-11-27 DIAGNOSIS — I10 Essential (primary) hypertension: Secondary | ICD-10-CM | POA: Insufficient documentation

## 2019-11-30 ENCOUNTER — Encounter: Payer: Self-pay | Admitting: Hematology and Oncology

## 2019-12-04 ENCOUNTER — Encounter: Payer: Self-pay | Admitting: Gastroenterology

## 2019-12-05 ENCOUNTER — Other Ambulatory Visit: Payer: Self-pay | Admitting: Gastroenterology

## 2019-12-05 ENCOUNTER — Ambulatory Visit (INDEPENDENT_AMBULATORY_CARE_PROVIDER_SITE_OTHER): Payer: Medicare HMO

## 2019-12-05 DIAGNOSIS — Z1159 Encounter for screening for other viral diseases: Secondary | ICD-10-CM

## 2019-12-05 LAB — SARS CORONAVIRUS 2 (TAT 6-24 HRS): SARS Coronavirus 2: NEGATIVE

## 2019-12-09 ENCOUNTER — Ambulatory Visit (AMBULATORY_SURGERY_CENTER): Payer: Medicare HMO | Admitting: Gastroenterology

## 2019-12-09 ENCOUNTER — Other Ambulatory Visit: Payer: Self-pay

## 2019-12-09 ENCOUNTER — Encounter: Payer: Self-pay | Admitting: Gastroenterology

## 2019-12-09 VITALS — BP 179/98 | HR 82 | Temp 96.9°F | Resp 15 | Ht 71.0 in | Wt 179.0 lb

## 2019-12-09 DIAGNOSIS — K449 Diaphragmatic hernia without obstruction or gangrene: Secondary | ICD-10-CM

## 2019-12-09 DIAGNOSIS — D122 Benign neoplasm of ascending colon: Secondary | ICD-10-CM

## 2019-12-09 DIAGNOSIS — R634 Abnormal weight loss: Secondary | ICD-10-CM | POA: Diagnosis not present

## 2019-12-09 DIAGNOSIS — Z1211 Encounter for screening for malignant neoplasm of colon: Secondary | ICD-10-CM

## 2019-12-09 DIAGNOSIS — K2950 Unspecified chronic gastritis without bleeding: Secondary | ICD-10-CM | POA: Diagnosis not present

## 2019-12-09 DIAGNOSIS — R194 Change in bowel habit: Secondary | ICD-10-CM

## 2019-12-09 DIAGNOSIS — R112 Nausea with vomiting, unspecified: Secondary | ICD-10-CM

## 2019-12-09 DIAGNOSIS — K634 Enteroptosis: Secondary | ICD-10-CM

## 2019-12-09 DIAGNOSIS — D123 Benign neoplasm of transverse colon: Secondary | ICD-10-CM | POA: Diagnosis not present

## 2019-12-09 DIAGNOSIS — K3189 Other diseases of stomach and duodenum: Secondary | ICD-10-CM | POA: Diagnosis not present

## 2019-12-09 MED ORDER — OMEPRAZOLE 20 MG PO CPDR: 20.0000 mg | DELAYED_RELEASE_CAPSULE | Freq: Every day | ORAL | 0 refills | Status: DC

## 2019-12-09 MED ORDER — SODIUM CHLORIDE 0.9 % IV SOLN: 500.0000 mL | Freq: Once | INTRAVENOUS | Status: DC

## 2019-12-09 NOTE — Patient Instructions (Signed)
Handouts given for polyps, gastritis and Hiatal Hernia.  Pick up Rx for Prilosec, 20 mg orally every day for one month. Take a half hour before eating in the morning.  YOU HAD AN ENDOSCOPIC PROCEDURE TODAY AT [facility]:   Refer to the procedure report that was given to you for any specific questions about what was found during the examination.  If the procedure report does not answer your questions, please call your gastroenterologist to clarify.  If you requested that your care partner not be given the details of your procedure findings, then the procedure report has been included in a sealed envelope for you to review at your convenience later.  YOU SHOULD EXPECT: Some feelings of bloating in the abdomen. Passage of more gas than usual.  Walking can help get rid of the air that was put into your GI tract during the procedure and reduce the bloating. If you had a lower endoscopy (such as a colonoscopy or flexible sigmoidoscopy) you may notice spotting of blood in your stool or on the toilet paper. If you underwent a bowel prep for your procedure, you may not have a normal bowel movement for a few days.  Please Note:  You might notice some irritation and congestion in your nose or some drainage.  This is from the oxygen used during your procedure.  There is no need for concern and it should clear up in a day or so.  SYMPTOMS TO REPORT IMMEDIATELY:   Following lower endoscopy (colonoscopy or flexible sigmoidoscopy):  Excessive amounts of blood in the stool  Significant tenderness or worsening of abdominal pains  Swelling of the abdomen that is new, acute  Fever of 100F or higher   Following upper endoscopy (EGD)  Vomiting of blood or coffee ground material  New chest pain or pain under the shoulder blades  Painful or persistently difficult swallowing  New shortness of breath  Fever of 100F or higher  Black, tarry-looking stools  For urgent or emergent issues, a  gastroenterologist can be reached at any hour by calling [phone].   DIET:  We do recommend a small meal at first, but then you may proceed to your regular diet.  Drink plenty of fluids but you should avoid alcoholic beverages for 24 hours.  ACTIVITY:  You should plan to take it easy for the rest of today and you should NOT DRIVE or use heavy machinery until tomorrow (because of the sedation medicines used during the test).    FOLLOW UP: Our staff will call the number listed on your records 48-72 hours following your procedure to check on you and address any questions or concerns that you may have regarding the information given to you following your procedure. If we do not reach you, we will leave a message.  We will attempt to reach you two times.  During this call, we will ask if you have developed any symptoms of COVID 19. If you develop any symptoms (ie: fever, flu-like symptoms, shortness of breath, cough etc.) before then, please call [phone].  If you test positive for Covid 19 in the 2 weeks post procedure, please call and report this information to [place].    If any biopsies were taken you will be contacted by phone or by letter within the next 1-3 weeks.  Please call us at [phone] if you have not heard about the biopsies in 3 weeks.    SIGNATURES/CONFIDENTIALITY: You and/or your care partner have signed  paperwork which will be entered into your electronic medical record.  These signatures attest to the fact that that the information above on your After Visit Summary has been reviewed and is understood.  Full responsibility of the confidentiality of this discharge information lies with you and/or your care-partner.

## 2019-12-09 NOTE — Op Note (Signed)
[facility] Patient Name: Luis Henderson Procedure Date: 12/09/2019 10:30 AM MRN: [id number] Endoscopist: Mallie Mussel L. Loletha Carrow , MD Age: 64 Referring MD:  Date of Birth: Nov 21, 1955 Gender: Male Account #: [id number] Procedure:                Upper GI endoscopy Indications:              Nausea with vomiting (recently subsided), Weight                            loss (improving since appetite recently improving) Medicines:                Monitored Anesthesia Care Procedure:                Pre-Anesthesia Assessment:                           - Prior to the procedure, a History and Physical                            was performed, and patient medications and                            allergies were reviewed. The patient's tolerance of                            previous anesthesia was also reviewed. The risks                            and benefits of the procedure and the sedation                            options and risks were discussed with the patient.                            All questions were answered, and informed consent                            was obtained. Prior Anticoagulants: The patient has                            taken no previous anticoagulant or antiplatelet                            agents. ASA Grade Assessment: III - A patient with                            severe systemic disease. After reviewing the risks                            and benefits, the patient was deemed in                            satisfactory condition to undergo the procedure.  After obtaining informed consent, the endoscope was                            passed under direct vision. Throughout the                            procedure, the patient's blood pressure, pulse, and                            oxygen saturations were monitored continuously. The                            Endoscope was introduced through the mouth, and   advanced to the second part of duodenum. The upper                            GI endoscopy was accomplished without difficulty.                            The patient tolerated the procedure well. Scope In: Scope Out: Findings:                 A 3 cm hiatal hernia was present.                           The exam of the esophagus was otherwise normal.                           Diffuse inflammation characterized by adherent                            blood, congestion (edema) and erosions was found in                            the entire examined stomach. Two biopsies were                            obtained in the gastric body (Jar 4) and in the                            gastric antrum (Jar 3) with cold forceps for                            histology.                           The exam of the stomach was otherwise normal.                           Nodular mucosa was found in the duodenal bulb.                            Biopsies were taken with a cold forceps for  histology from the proximal side of the pylorus                            (toward anterior/inferior portion of bulb -                            enlarged fold; Jar 1) and from an area adjacent to                            that with nodular mucosa (see photos)                           The exam of the duodenum was otherwise normal. Complications:            No immediate complications. Estimated Blood Loss:     Estimated blood loss was minimal. Impression:               - 3 cm hiatal hernia.                           - Gastritis.                           - Nodular mucosa in the duodenal bulb. Biopsied.                           - Two biopsies were obtained in the gastric body                            and in the gastric antrum. Recommendation:           - Patient has a contact number available for                            emergencies. The signs and symptoms of potential                             delayed complications were discussed with the                            patient. Return to normal activities tomorrow.                            Written discharge instructions were provided to the                            patient.                           - Resume previous diet.                           - Continue present medications.                           - Await pathology results.                           -  See the other procedure note for documentation of                            additional recommendations.                           - Use Prilosec (omeprazole) 20 mg PO daily for 4                            weeks. Disp #30, RF zero Abeeha Twist L. Loletha Carrow, MD 12/09/2019 11:25:19 AM This report has been signed electronically.

## 2019-12-09 NOTE — Progress Notes (Signed)
Temp by JB, Vitals by DT 

## 2019-12-09 NOTE — Op Note (Signed)
[facility] Patient Name: Luis Henderson Procedure Date: 12/09/2019 10:30 AM MRN: [id number] Endoscopist: Mallie Mussel L. Loletha Carrow , MD Age: 64 Referring MD:  Date of Birth: August 02, 1956 Gender: Male Account #: [id number] Procedure:                Colonoscopy Indications:              Screening for colorectal malignant neoplasm                            (patient reported last colonoscopy approximately 15                            years prior) Medicines:                Monitored Anesthesia Care Procedure:                Pre-Anesthesia Assessment:                           - Prior to the procedure, a History and Physical                            was performed, and patient medications and                            allergies were reviewed. The patient's tolerance of                            previous anesthesia was also reviewed. The risks                            and benefits of the procedure and the sedation                            options and risks were discussed with the patient.                            All questions were answered, and informed consent                            was obtained. Prior Anticoagulants: The patient has                            taken no previous anticoagulant or antiplatelet                            agents. ASA Grade Assessment: III - A patient with                            severe systemic disease. After reviewing the risks                            and benefits, the patient was deemed in  satisfactory condition to undergo the procedure.                           After obtaining informed consent, the colonoscope                            was passed under direct vision. Throughout the                            procedure, the patient's blood pressure, pulse, and                            oxygen saturations were monitored continuously. The                            Colonoscope was introduced through the anus and                             advanced to the the terminal ileum, with                            identification of the appendiceal orifice and IC                            valve. The colonoscopy was performed without                            difficulty. The patient tolerated the procedure                            well. The quality of the bowel preparation was                            good. The terminal ileum, ileocecal valve,                            appendiceal orifice, and rectum were photographed. Scope In: 10:54:53 AM Scope Out: 11:12:28 AM Scope Withdrawal Time: 0 hours 14 minutes 52 seconds  Total Procedure Duration: 0 hours 17 minutes 35 seconds  Findings:                 The perianal and digital rectal examinations were                            normal.                           The terminal ileum appeared normal.                           Multiple diverticula were found in the entire colon.                           Two sessile polyps were found in the transverse  colon and ascending colon. The polyps were 4 mm in                            size. These polyps were removed with a cold snare.                            Resection and retrieval were complete.                           The exam was otherwise without abnormality on                            direct and retroflexion views. Complications:            No immediate complications. Estimated Blood Loss:     Estimated blood loss was minimal. Impression:               - The examined portion of the ileum was normal.                           - Diverticulosis in the entire examined colon.                           - Two 4 mm polyps in the transverse colon and in                            the ascending colon, removed with a cold snare.                            Resected and retrieved.                           - The examination was otherwise normal on direct                            and  retroflexion views. Recommendation:           - Patient has a contact number available for                            emergencies. The signs and symptoms of potential                            delayed complications were discussed with the                            patient. Return to normal activities tomorrow.                            Written discharge instructions were provided to the                            patient.                           -  Resume previous diet.                           - Continue present medications.                           - Await pathology results.                           - Repeat colonoscopy is recommended for                            surveillance. The colonoscopy date will be                            determined after pathology results from today's                            exam become available for review. Melissa Tomaselli L. Loletha Carrow, MD 12/09/2019 11:28:27 AM This report has been signed electronically.

## 2019-12-09 NOTE — Progress Notes (Signed)
To PACU, Vss. Report to [email or web address] 

## 2019-12-10 ENCOUNTER — Encounter: Payer: Self-pay | Admitting: Adult Health

## 2019-12-10 ENCOUNTER — Ambulatory Visit: Payer: Medicare HMO | Admitting: Adult Health

## 2019-12-10 VITALS — BP 134/73 | HR 97 | Temp 97.2°F | Ht 71.0 in | Wt 173.8 lb

## 2019-12-10 DIAGNOSIS — Z8669 Personal history of other diseases of the nervous system and sense organs: Secondary | ICD-10-CM

## 2019-12-10 DIAGNOSIS — Z982 Presence of cerebrospinal fluid drainage device: Secondary | ICD-10-CM

## 2019-12-10 NOTE — Patient Instructions (Addendum)
Your Plan:  Continue to monitor symptoms If your symptoms worsen or you develop new symptoms please let us know.   Thank you for coming to see us at [facility]. I hope we have been able to provide you high quality care today.  You may receive a patient satisfaction survey over the next few weeks. We would appreciate your feedback and comments so that we may continue to improve ourselves and the health of our patients.   

## 2019-12-10 NOTE — Progress Notes (Signed)
PATIENT: Luis Henderson DOB: 1956-09-17  REASON FOR VISIT: follow up HISTORY FROM: patient  HISTORY OF PRESENT ILLNESS: Today 12/10/19:  Luis Henderson is a 64 year old male with a history of hydrocephalus with VP shunt placement.  He returns today for follow-up.  At the last visit CT scan was completed that showed that the shunt was working well.  The patient denies any new symptoms.  No change in his gait or balance.  No change in his bowels or bladder.  No change in his vision.  No change in cognition.  Reports that he has had ongoing nausea and vomiting.  Had a colonoscopy yesterday.   HISTORY (Copied from Dr.Dohmeier's note) Luis Henderson is a 64 y.o. male seen here upon a referral  from Dr. Karle Plumber, MD for a follow up on hydrocephalus/ VP shunt.  Luis Henderson reports that in 2017 he gradually lost control of his motor function especially in the lower extremities, and also control of bladder and bowel.  This was related to the development of hydrocephalus.  Dr. Earle Gell was his neurosurgeon who placed the VP shunt emergently.  After the procedure he was arrested and jailed in [place] for 6 month until all charges were reportedly dismissed, he reports that when he returned after his release he faced costs for the upkeeping of his cats, several thousand dollars, and he had lost his home, his wife had left him.  He was unemployed and uninsured- and no follow up. He now has insurance. He developed emesis after a large meals, and he suspects he has some obstruction. He had his gallbladder removed 3 weeks ago. 05-01-2019. Readmitted from 05-02-21-2020 from emesis.  He was also diagnosed with a renal cyst and stones, he described dizziness, polycythemia. Has HTN, bilateral inguinal hernias.   Neurological question is the follow up for shunt VP function.    REVIEW OF SYSTEMS: Out of a complete 14 system review of symptoms, the patient complains only of the following  symptoms, and all other reviewed systems are negative.  See HPI  ALLERGIES: Allergies  Allergen Reactions  . Codeine Itching  . Lisinopril Cough    HOME MEDICATIONS: Outpatient Medications Prior to Visit  Medication Sig Dispense Refill  . amLODipine (NORVASC) 10 MG tablet Take 0.5 tablets (5 mg total) by mouth 2 (two) times daily. 30 tablet 6  . aspirin EC 81 MG tablet Take 81 mg by mouth daily.    Marland Kitchen omeprazole (PRILOSEC) 20 MG capsule Take 1 capsule (20 mg total) by mouth daily for 28 days. 30 capsule 0  . promethazine (PHENERGAN) 12.5 MG suppository Place 1 suppository (12.5 mg total) rectally every 8 (eight) hours as needed for nausea or vomiting. 12 each 2   No facility-administered medications prior to visit.    PAST MEDICAL HISTORY: Past Medical History:  Diagnosis Date  . Anxiety   . Depression   . Diverticulitis 04/2019  . History of kidney stones   . History of prediabetes   . Hyperlipidemia   . Hypertension   . Polycythemia     PAST SURGICAL HISTORY: Past Surgical History:  Procedure Laterality Date  . CHOLECYSTECTOMY N/A 05/06/2019   Procedure: LAPAROSCOPIC CHOLECYSTECTOMY WITH INTRAOPERATIVE CHOLANGIOGRAM;  Surgeon: Donnie Mesa, MD;  Location: [facility];  Service: General;  Laterality: N/A;  . COLONOSCOPY    . CYSTOSCOPY WITH RETROGRADE PYELOGRAM, URETEROSCOPY AND STENT PLACEMENT Right 01/15/2015   Procedure: CYSTOSCOPY, RIGHT URETEROSCOPY/ RETROGRADE PYELOGRAM,HOLMIUM LASER/ LITHOTRIPSY, STENT PLACEMENT, URETERAL BALLOON  DIALATION;  Surgeon: Kathie Rhodes, MD;  Location: [facility];  Service: Urology;  Laterality: Right;  . INGUINAL HERNIA REPAIR Bilateral 08/05/2019   Procedure: OPEN BILATERAL INGUINAL HERNIA REPAIRS WITH MESH;  Surgeon: Donnie Mesa, MD;  Location: [facility];  Service: General;  Laterality: Bilateral;  . INSERTION OF MESH Bilateral 08/05/2019   Procedure: INSERTION OF MESH;  Surgeon: Donnie Mesa, MD;  Location: [facility];  Service: General;  Laterality: Bilateral;  . SKIN GRAFT     left hand  . VENTRICULOPERITONEAL SHUNT Right 09/04/2016   Procedure: SHUNT INSERTION VENTRICULAR-PERITONEAL;  Surgeon: Newman Pies, MD;  Location: [facility];  Service: Neurosurgery;  Laterality: Right;  . WISDOM TOOTH EXTRACTION      FAMILY HISTORY: Family History  Problem Relation Age of Onset  . Hyperlipidemia Father   . Colon cancer Neg Hx   . Esophageal cancer Neg Hx   . Stomach cancer Neg Hx   . Pancreatic cancer Neg Hx     SOCIAL HISTORY: Social History   Socioeconomic History  . Marital status: Divorced    Spouse name: Not on file  . Number of children: 2  . Years of education: Not on file  . Highest education level: Not on file  Occupational History    Employer: [id number]    Comment: Radio broadcast assistant  Tobacco Use  . Smoking status: Current Every Day Smoker    Packs/day: 1.00    Years: 28.00    Pack years: 28.00    Types: Cigarettes  . Smokeless tobacco: Never Used  Substance and Sexual Activity  . Alcohol use: Yes    Alcohol/week: 2.0 - 3.0 standard drinks    Types: 2 - 3 Cans of beer per week    Comment: daily alcohol use/drinks beer, wine, and liquor  . Drug use: Yes    Types: Marijuana    Comment: 2x per week  . Sexual activity: Not on file  Other Topics Concern  . Not on file  Social History Narrative  . Not on file   Social Determinants of Health   Financial Resource Strain:   . Difficulty of Paying Living Expenses: Not on file  Food Insecurity:   . Worried About Charity fundraiser in the Last Year: Not on file  . Ran Out of Food in the Last Year: Not on file  Transportation Needs:   . Lack of Transportation (Medical): Not on file  . Lack of Transportation (Non-Medical): Not on file  Physical Activity:   . Days of Exercise per Week: Not on file  . Minutes of Exercise per Session: Not on file  Stress:   . Feeling of Stress : Not on file  Social Connections:   .  Frequency of Communication with Friends and Family: Not on file  . Frequency of Social Gatherings with Friends and Family: Not on file  . Attends Religious Services: Not on file  . Active Member of Clubs or Organizations: Not on file  . Attends Archivist Meetings: Not on file  . Marital Status: Not on file  Intimate Partner Violence:   . Fear of Current or Ex-Partner: Not on file  . Emotionally Abused: Not on file  . Physically Abused: Not on file  . Sexually Abused: Not on file      PHYSICAL EXAM  Vitals:   12/10/19 0825  BP: 134/73  Pulse: 97  Temp: (!) 97.2 F (36.2 C)  Weight: 173 lb 12.8 oz (78.8  kg)  Height: 5\' 11"  (1.803 m)   Body mass index is 24.24 kg/m.  Generalized: Well developed, in no acute distress   Neurological examination  Mentation: Alert oriented to time, place, history taking. Follows all commands speech and language fluent Cranial nerve II-XII: Pupils were equal round reactive to light. Extraocular movements were full, visual field were full on confrontational test.. Head turning and shoulder shrug  were normal and symmetric. Motor: The motor testing reveals 5 over 5 strength of all 4 extremities. Good symmetric motor tone is noted throughout.  Sensory: Sensory testing is intact to soft touch on all 4 extremities. No evidence of extinction is noted.  Coordination: Cerebellar testing reveals good finger-nose-finger and heel-to-shin bilaterally.  Gait and station: Gait is normal. Tandem gait is normal. Romberg is negative. No drift is seen.  Reflexes: Deep tendon reflexes are symmetric and normal bilaterally.   DIAGNOSTIC DATA (LABS, IMAGING, TESTING) - I reviewed patient records, labs, notes, testing and imaging myself where available.  Lab Results  Component Value Date   WBC 9.5 11/10/2019   HGB 20.3 (HH) 11/10/2019   HCT 59.0 (H) 11/10/2019   MCV 86 11/10/2019   PLT 315 11/10/2019      Component Value Date/Time   NA 143 11/10/2019  0943   K 5.0 11/10/2019 0943   CL 100 11/10/2019 0943   CO2 24 11/10/2019 0943   GLUCOSE 154 (H) 11/10/2019 0943   GLUCOSE 123 (H) 05/07/2019 0551   BUN 18 11/10/2019 0943   CREATININE 0.99 11/10/2019 0943   CREATININE 0.85 11/16/2017 1209   CREATININE 1.17 04/14/2016 1002   CALCIUM 9.7 11/12/2019 0951   PROT 7.1 11/10/2019 0943   ALBUMIN 4.9 (H) 11/10/2019 0943   AST 22 11/10/2019 0943   AST 16 11/16/2017 1209   ALT 19 11/10/2019 0943   ALT 14 11/16/2017 1209   ALKPHOS 89 11/10/2019 0943   BILITOT 0.5 11/10/2019 0943   BILITOT 0.5 11/16/2017 1209   GFRNONAA 80 11/10/2019 0943   GFRNONAA >60 11/16/2017 1209   GFRAA 93 11/10/2019 0943   GFRAA >60 11/16/2017 1209   Lab Results  Component Value Date   CHOL 178 04/14/2016   HDL 47 04/14/2016   LDLCALC 103 04/14/2016   TRIG 140 04/14/2016   CHOLHDL 3.8 04/14/2016   Lab Results  Component Value Date   HGBA1C 5.9 (H) 11/10/2019   No results found for: [id number] Lab Results  Component Value Date   TSH 1.440 11/10/2019      ASSESSMENT AND PLAN 64 y.o. year old male  has a past medical history of Anxiety, Depression, Diverticulitis (04/2019), History of kidney stones, History of prediabetes, Hyperlipidemia, Hypertension, and Polycythemia. here with:  1.  History of hydrocephalus with VP shunt placement  -Continue to monitor for symptoms -Last CT scan of the brain showed VP shunt was working well -Advised if he develops new symptoms he should let us know.   - Follow-up in 1 year or sooner if needed  I spent 15 minutes with the patient this time was spent reviewing the chart and plan of care   Ward Givens, MSN, NP-C 12/10/2019, 8:46 AM [facility] [address] [place], [place] [address] [phone]

## 2019-12-11 ENCOUNTER — Telehealth: Payer: Self-pay

## 2019-12-11 NOTE — Telephone Encounter (Signed)
  Follow up Call-  Call back number 12/09/2019  Post procedure Call Back phone  # [phone]  Permission to leave phone message Yes  Some recent data might be hidden     Patient questions:  Do you have a fever, pain , or abdominal swelling? No. Pain Score  0 *  Have you tolerated food without any problems? Yes.    Have you been able to return to your normal activities? Yes.    Do you have any questions about your discharge instructions: Diet   No. Medications  No. Follow up visit  No.  Do you have questions or concerns about your Care? No.  Actions: * If pain score is 4 or above: No action needed, pain <4.  Have you developed a fever since your procedure? No 2.   Have you had an respiratory symptoms (SOB or cough) since your procedure? No 3.   Have you tested positive for COVID 19 since your procedure No  4.   Have you had any family members/close contacts diagnosed with the COVID 19 since your procedure?  No   If yes to any of these questions please route to Joylene John, RN and Alphonsa Gin, RN.

## 2019-12-16 ENCOUNTER — Encounter: Payer: Self-pay | Admitting: Gastroenterology

## 2019-12-22 ENCOUNTER — Other Ambulatory Visit: Payer: Self-pay | Admitting: Internal Medicine

## 2019-12-22 DIAGNOSIS — I10 Essential (primary) hypertension: Secondary | ICD-10-CM

## 2019-12-29 ENCOUNTER — Ambulatory Visit: Payer: Medicare HMO | Attending: Internal Medicine | Admitting: Internal Medicine

## 2019-12-29 ENCOUNTER — Other Ambulatory Visit: Payer: Self-pay

## 2019-12-29 DIAGNOSIS — I10 Essential (primary) hypertension: Secondary | ICD-10-CM | POA: Diagnosis not present

## 2019-12-29 DIAGNOSIS — D751 Secondary polycythemia: Secondary | ICD-10-CM

## 2019-12-29 DIAGNOSIS — D126 Benign neoplasm of colon, unspecified: Secondary | ICD-10-CM | POA: Diagnosis not present

## 2019-12-29 DIAGNOSIS — Z720 Tobacco use: Secondary | ICD-10-CM

## 2019-12-29 DIAGNOSIS — K2970 Gastritis, unspecified, without bleeding: Secondary | ICD-10-CM | POA: Diagnosis not present

## 2019-12-29 DIAGNOSIS — G911 Obstructive hydrocephalus: Secondary | ICD-10-CM

## 2019-12-29 NOTE — Progress Notes (Signed)
Virtual Visit via Telephone Note Due to current restrictions/limitations of in-office visits due to the COVID-19 pandemic, this scheduled clinical appointment was converted to a telehealth visit  I connected with Luis Henderson on 12/29/19 at 8:39 a.m by telephone and verified that I am speaking with the correct person using two identifiers. I am in my office.  The patient is at home.  Only the patient and myself participated in this encounter.  I discussed the limitations, risks, security and privacy concerns of performing an evaluation and management service by telephone and the availability of in person appointments. I also discussed with the patient that there may be a patient responsible charge related to this service. The patient expressed understanding and agreed to proceed.   History of Present Illness: Patient with history of HTN, tob dep, secondary polycythemia, diverticulitis,acquired obstructive hydrocephalus status post shunt placement 08/2017, preDM.  Last visit 10/2018.  Patient was referred to gastroenterologist Dr. Loletha Carrow for unexplained weight loss and recurrent vomiting.  He underwent EGD and colonoscopy.  EGD revealed 3 cm hiatal hernia and diffuse gastritis in the stomach with erosions.  Patient placed on omeprazole.  Told to discuss with PCP about whether aspirin needs to be continued or not.  Patient currently taking aspirin because of diagnosis of polycythemia.   2 tubular adenomatous polyps were removed from the colon.  Plan for repeat colonoscopy in 7 years  Wgh up 5-6 lbs.  He is happy at this wgh now.  Secondary Polycythemia: Saw our oncologist Dr. Lorenso Courier.  Diagnosed with secondary polycythemia due to tobacco dependence.  Patient strongly advised to quit smoking.  He declined having sleep study done but did not have symptoms to suggest sleep apnea -serious considering smoking cessation. Smoking 1 pk/day. Quit smoking in 2002; stayed tob free for 15 yrs.  Feels he can  quit cold [place].   He also saw the neurologist NP for f/u on his history of acquired obstructive hydrocephalus status post shunt placement.  No concerns at this time.  Has appt tomorrow with Dr. Schuyler Amor tomorrow.  HTN:  Still taking Norvasc.  BP has been good.  He limits salt in the foods.  Outpatient Encounter Medications as of 12/29/2019  Medication Sig  . amLODipine (NORVASC) 10 MG tablet TAKE 0.5 TABLETS (5 MG TOTAL) BY MOUTH 2 (TWO) TIMES DAILY.  Marland Kitchen aspirin EC 81 MG tablet Take 81 mg by mouth daily.  Marland Kitchen omeprazole (PRILOSEC) 20 MG capsule Take 1 capsule (20 mg total) by mouth daily for 28 days.  . promethazine (PHENERGAN) 12.5 MG suppository Place 1 suppository (12.5 mg total) rectally every 8 (eight) hours as needed for nausea or vomiting.   No facility-administered encounter medications on file as of 12/29/2019.    Observations/Objective: Results for orders placed or performed in visit on 12/05/19  SARS Coronavirus 2 (TAT 6-24 hrs)  Result Value Ref Range   SARS Coronavirus 2 RESULT: NEGATIVE    Lab Results  Component Value Date   WBC 9.5 11/10/2019   HGB 20.3 (HH) 11/10/2019   HCT 59.0 (H) 11/10/2019   MCV 86 11/10/2019   PLT 315 11/10/2019     Chemistry      Component Value Date/Time   NA 143 11/10/2019 0943   K 5.0 11/10/2019 0943   CL 100 11/10/2019 0943   CO2 24 11/10/2019 0943   BUN 18 11/10/2019 0943   CREATININE 0.99 11/10/2019 0943   CREATININE 0.85 11/16/2017 1209   CREATININE 1.17 04/14/2016 1002  Component Value Date/Time   CALCIUM 9.7 11/12/2019 0951   ALKPHOS 89 11/10/2019 0943   AST 22 11/10/2019 0943   AST 16 11/16/2017 1209   ALT 19 11/10/2019 0943   ALT 14 11/16/2017 1209   BILITOT 0.5 11/10/2019 0943   BILITOT 0.5 11/16/2017 1209     Lab Results  Component Value Date   PTH 28 11/12/2019   PTH Comment 11/12/2019   CALCIUM 9.7 11/12/2019   CAION 1.15 05/07/2017   Assessment and Plan: 1. Tobacco abuse Advised to quit.  He feels  that he should and is willing to give a trial of quitting.  He feels he can quit cold [place].  Less than 5 minutes spent on counseling  2. Secondary polycythemia Seen by oncology.  Encouraged to quit smoking which he plans to do.  3. Gastritis determined by endoscopy I have sent a message to Dr. Lorenso Courier to inquire whether patient needs to continue the low-dose aspirin.  If he does we will have him continue omeprazole long-term with the aspirin.  4. Adenomatous polyp of colon, unspecified part of colon Plan for repeat colonoscopy in 7 years  5. Essential hypertension Reported home blood pressure readings are good  6. 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HCC) Stable.  Patient has a shunt in place   Follow Up Instructions: 4 mths   I discussed the assessment and treatment plan with the patient. The patient was provided an opportunity to ask questions and all were answered. The patient agreed with the plan and demonstrated an understanding of the instructions.   The patient was advised to call back or seek an in-person evaluation if the symptoms worsen or if the condition fails to improve as anticipated.  I provided 12 minutes of non-face-to-face time during this encounter.   Karle Plumber, MD

## 2019-12-30 DIAGNOSIS — H25812 Combined forms of age-related cataract, left eye: Secondary | ICD-10-CM | POA: Diagnosis not present

## 2019-12-30 DIAGNOSIS — H524 Presbyopia: Secondary | ICD-10-CM | POA: Diagnosis not present

## 2019-12-30 DIAGNOSIS — H5213 Myopia, bilateral: Secondary | ICD-10-CM | POA: Diagnosis not present

## 2019-12-30 DIAGNOSIS — H33322 Round hole, left eye: Secondary | ICD-10-CM | POA: Diagnosis not present

## 2019-12-30 DIAGNOSIS — H52203 Unspecified astigmatism, bilateral: Secondary | ICD-10-CM | POA: Diagnosis not present

## 2019-12-30 DIAGNOSIS — H2511 Age-related nuclear cataract, right eye: Secondary | ICD-10-CM | POA: Diagnosis not present

## 2019-12-31 ENCOUNTER — Encounter: Payer: Self-pay | Admitting: Internal Medicine

## 2020-01-01 ENCOUNTER — Other Ambulatory Visit: Payer: Self-pay | Admitting: Gastroenterology

## 2020-01-05 ENCOUNTER — Encounter (INDEPENDENT_AMBULATORY_CARE_PROVIDER_SITE_OTHER): Payer: Medicare HMO | Admitting: Ophthalmology

## 2020-01-05 DIAGNOSIS — H43813 Vitreous degeneration, bilateral: Secondary | ICD-10-CM

## 2020-01-05 DIAGNOSIS — H35033 Hypertensive retinopathy, bilateral: Secondary | ICD-10-CM

## 2020-01-05 DIAGNOSIS — I10 Essential (primary) hypertension: Secondary | ICD-10-CM

## 2020-01-05 DIAGNOSIS — H33302 Unspecified retinal break, left eye: Secondary | ICD-10-CM

## 2020-01-05 DIAGNOSIS — H353131 Nonexudative age-related macular degeneration, bilateral, early dry stage: Secondary | ICD-10-CM | POA: Diagnosis not present

## 2020-01-05 DIAGNOSIS — H2513 Age-related nuclear cataract, bilateral: Secondary | ICD-10-CM

## 2020-01-09 ENCOUNTER — Encounter: Payer: Self-pay | Admitting: Internal Medicine

## 2020-01-12 ENCOUNTER — Encounter: Payer: Self-pay | Admitting: Internal Medicine

## 2020-01-23 ENCOUNTER — Encounter (INDEPENDENT_AMBULATORY_CARE_PROVIDER_SITE_OTHER): Payer: Medicare HMO | Admitting: Ophthalmology

## 2020-01-23 ENCOUNTER — Encounter (INDEPENDENT_AMBULATORY_CARE_PROVIDER_SITE_OTHER): Payer: Self-pay

## 2020-01-23 ENCOUNTER — Other Ambulatory Visit: Payer: Self-pay

## 2020-02-24 ENCOUNTER — Telehealth: Payer: Self-pay | Admitting: Family Medicine

## 2020-02-25 ENCOUNTER — Ambulatory Visit (INDEPENDENT_AMBULATORY_CARE_PROVIDER_SITE_OTHER): Payer: Medicare HMO | Admitting: Family Medicine

## 2020-02-25 ENCOUNTER — Telehealth: Payer: Self-pay | Admitting: Family Medicine

## 2020-02-25 ENCOUNTER — Encounter: Payer: Self-pay | Admitting: Family Medicine

## 2020-02-25 ENCOUNTER — Other Ambulatory Visit: Payer: Self-pay

## 2020-02-25 DIAGNOSIS — K2970 Gastritis, unspecified, without bleeding: Secondary | ICD-10-CM | POA: Diagnosis not present

## 2020-02-25 DIAGNOSIS — Z09 Encounter for follow-up examination after completed treatment for conditions other than malignant neoplasm: Secondary | ICD-10-CM

## 2020-02-25 DIAGNOSIS — I10 Essential (primary) hypertension: Secondary | ICD-10-CM

## 2020-02-25 DIAGNOSIS — Z7689 Persons encountering health services in other specified circumstances: Secondary | ICD-10-CM | POA: Diagnosis not present

## 2020-02-25 NOTE — Telephone Encounter (Signed)
Done

## 2020-02-25 NOTE — Progress Notes (Signed)
Virtual Visit via Telephone Note  I connected with Luis Henderson on 02/25/20 at  1:50 PM EDT by telephone and verified that I am speaking with the correct person using two identifiers.   I discussed the limitations, risks, security and privacy concerns of performing an evaluation and management service by telephone and the availability of in person appointments. I also discussed with the patient that there may be a patient responsible charge related to this service. The patient expressed understanding and agreed to proceed.   History of Present Illness:  Social History   Socioeconomic History  . Marital status: Divorced    Spouse name: Not on file  . Number of children: 2  . Years of education: Not on file  . Highest education level: Not on file  Occupational History    Employer: [id number]    Comment: Radio broadcast assistant  Tobacco Use  . Smoking status: Current Every Day Smoker    Packs/day: 1.00    Years: 28.00    Pack years: 28.00    Types: Cigarettes  . Smokeless tobacco: Never Used  Substance and Sexual Activity  . Alcohol use: Yes    Alcohol/week: 2.0 - 3.0 standard drinks    Types: 2 - 3 Cans of beer per week    Comment: daily alcohol use/drinks beer, wine, and liquor  . Drug use: Yes    Types: Marijuana    Comment: 2x per week  . Sexual activity: Not Currently  Other Topics Concern  . Not on file  Social History Narrative  . Not on file   Social Determinants of Health   Financial Resource Strain:   . Difficulty of Paying Living Expenses:   Food Insecurity:   . Worried About Charity fundraiser in the Last Year:   . Arboriculturist in the Last Year:   Transportation Needs:   . Film/video editor (Medical):   Marland Kitchen Lack of Transportation (Non-Medical):   Physical Activity:   . Days of Exercise per Week:   . Minutes of Exercise per Session:   Stress:   . Feeling of Stress :   Social Connections:   . Frequency of Communication with Friends and Family:   .  Frequency of Social Gatherings with Friends and Family:   . Attends Religious Services:   . Active Member of Clubs or Organizations:   . Attends Archivist Meetings:   Marland Kitchen Marital Status:   Intimate Partner Violence:   . Fear of Current or Ex-Partner:   . Emotionally Abused:   Marland Kitchen Physically Abused:   . Sexually Abused:    Past Surgical History:  Procedure Laterality Date  . CHOLECYSTECTOMY N/A 05/06/2019   Procedure: LAPAROSCOPIC CHOLECYSTECTOMY WITH INTRAOPERATIVE CHOLANGIOGRAM;  Surgeon: Donnie Mesa, MD;  Location: [facility];  Service: General;  Laterality: N/A;  . COLONOSCOPY    . CYSTOSCOPY WITH RETROGRADE PYELOGRAM, URETEROSCOPY AND STENT PLACEMENT Right 01/15/2015   Procedure: CYSTOSCOPY, RIGHT URETEROSCOPY/ RETROGRADE PYELOGRAM,HOLMIUM LASER/ LITHOTRIPSY, STENT PLACEMENT, URETERAL BALLOON DIALATION;  Surgeon: Kathie Rhodes, MD;  Location: [facility];  Service: Urology;  Laterality: Right;  . INGUINAL HERNIA REPAIR Bilateral 08/05/2019   Procedure: OPEN BILATERAL INGUINAL HERNIA REPAIRS WITH MESH;  Surgeon: Donnie Mesa, MD;  Location: [facility];  Service: General;  Laterality: Bilateral;  . INSERTION OF MESH Bilateral 08/05/2019   Procedure: INSERTION OF MESH;  Surgeon: Donnie Mesa, MD;  Location: [facility];  Service: General;  Laterality: Bilateral;  . SKIN GRAFT  left hand  . VENTRICULOPERITONEAL SHUNT Right 09/04/2016   Procedure: SHUNT INSERTION VENTRICULAR-PERITONEAL;  Surgeon: Newman Pies, MD;  Location: [facility];  Service: Neurosurgery;  Laterality: Right;  . WISDOM TOOTH EXTRACTION      Past Medical History:  Diagnosis Date  . Anxiety   . Depression   . Diverticulitis 04/2019  . History of kidney stones   . History of prediabetes   . Hyperlipidemia   . Hypertension   . Polycythemia     Past Medical History:  Diagnosis Date  . Anxiety   . Depression   . Diverticulitis 04/2019  . History of kidney stones   . History of  prediabetes   . Hyperlipidemia   . Hypertension   . Polycythemia     Allergies  Allergen Reactions  . Codeine Itching  . Lisinopril Cough    Current Status: This will be [name] initial office visit with me. he was previously seeing Dr. Wynetta Emery at [facility] for his PCP needs. Since his last office visit, he is doing well with no complaints. He has extensive GI history. He has recently began a supplement called Butyrate, which he takes twice daily. He states that he is doing well and his digestive system is much improved. He denies fevers, chills, fatigue, recent infections, weight loss, and night sweats. He has not had any headaches, visual changes, dizziness, and falls. No chest pain, heart palpitations, cough and shortness of breath reported. No reports of GI problems such as nausea, vomiting, diarrhea, and constipation. He has no reports of blood in stools, dysuria and hematuria. No depression or anxiety, and denies suicidal ideations, homicidal ideations, or auditory hallucinations. He is taking all medications as prescribed. He denies pain today.   Observations/Objective:  Telephone Virtual Visit.   Assessment and Plan:  1. Encounter to establish care  2. Gastritis determined by endoscopy  3. Essential hypertension He will continue to take medications as prescribed, to decrease high sodium intake, excessive alcohol intake, increase potassium intake, smoking cessation, and increase physical activity of at least 30 minutes of cardio activity daily. He will continue to follow Heart Healthy or DASH diet.  4. Follow up He will follow up in 3 months.   No orders of the defined types were placed in this encounter.   No orders of the defined types were placed in this encounter.  Referral Orders  No referral(s) requested today    Kathe Becton,  MSN, FNP-BC [facility] [address]  [place], [place] [address] [phone]- fax  No orders of the defined types were placed in this encounter.  No orders of the defined types were placed in this encounter.   Kathe Becton,  MSN, FNP-BC [facility] [address] [place], [place] [address] [phone]- fax    I discussed the assessment and treatment plan with the patient. The patient was provided an opportunity to ask questions and all were answered. The patient agreed with the plan and demonstrated an understanding of the instructions.   The patient was advised to call back or seek an in-person evaluation if the symptoms worsen or if the condition fails to improve as anticipated.  I provided 20 minutes of non-face-to-face time during this encounter.   Azzie Glatter, FNP

## 2020-03-03 ENCOUNTER — Encounter: Payer: Self-pay | Admitting: Internal Medicine

## 2020-03-03 NOTE — Progress Notes (Signed)
Patient seen 12/30/2019 by [facility]. Diagnosed with nuclear sclerosis OD, operculated tear OS, combined cataract OS. Patient referred to a retinal specialist.

## 2020-03-08 ENCOUNTER — Emergency Department (HOSPITAL_COMMUNITY)
Admission: EM | Admit: 2020-03-08 | Discharge: 2020-03-09 | Disposition: A | Discharge status: Home / Self Care | Payer: Medicare HMO | Attending: Emergency Medicine | Admitting: Emergency Medicine

## 2020-03-08 ENCOUNTER — Other Ambulatory Visit: Payer: Self-pay

## 2020-03-08 ENCOUNTER — Emergency Department (HOSPITAL_COMMUNITY): Payer: Medicare HMO

## 2020-03-08 DIAGNOSIS — K5730 Diverticulosis of large intestine without perforation or abscess without bleeding: Secondary | ICD-10-CM | POA: Diagnosis not present

## 2020-03-08 DIAGNOSIS — F17210 Nicotine dependence, cigarettes, uncomplicated: Secondary | ICD-10-CM | POA: Diagnosis not present

## 2020-03-08 DIAGNOSIS — Z7982 Long term (current) use of aspirin: Secondary | ICD-10-CM | POA: Diagnosis not present

## 2020-03-08 DIAGNOSIS — Z79899 Other long term (current) drug therapy: Secondary | ICD-10-CM | POA: Diagnosis not present

## 2020-03-08 DIAGNOSIS — R112 Nausea with vomiting, unspecified: Secondary | ICD-10-CM | POA: Diagnosis not present

## 2020-03-08 DIAGNOSIS — I10 Essential (primary) hypertension: Secondary | ICD-10-CM | POA: Diagnosis not present

## 2020-03-08 DIAGNOSIS — R1013 Epigastric pain: Secondary | ICD-10-CM | POA: Diagnosis not present

## 2020-03-08 DIAGNOSIS — N200 Calculus of kidney: Secondary | ICD-10-CM | POA: Diagnosis not present

## 2020-03-08 DIAGNOSIS — R69 Illness, unspecified: Secondary | ICD-10-CM | POA: Diagnosis not present

## 2020-03-08 LAB — URINALYSIS, ROUTINE W REFLEX MICROSCOPIC
Bilirubin Urine: NEGATIVE
Glucose, UA: NEGATIVE mg/dL
Hgb urine dipstick: NEGATIVE
Ketones, ur: NEGATIVE mg/dL
Leukocytes,Ua: NEGATIVE
Nitrite: NEGATIVE
Protein, ur: NEGATIVE mg/dL
Specific Gravity, Urine: 1.013 (ref 1.005–1.030)
pH: 7 (ref 5.0–8.0)

## 2020-03-08 LAB — COMPREHENSIVE METABOLIC PANEL
ALT: 22 U/L (ref 0–44)
AST: 21 U/L (ref 15–41)
Albumin: 4.7 g/dL (ref 3.5–5.0)
Alkaline Phosphatase: 70 U/L (ref 38–126)
Anion gap: 13 (ref 5–15)
BUN: 13 mg/dL (ref 8–23)
CO2: 20 mmol/L — ABNORMAL LOW (ref 22–32)
Calcium: 9.5 mg/dL (ref 8.9–10.3)
Chloride: 104 mmol/L (ref 98–111)
Creatinine, Ser: 0.93 mg/dL (ref 0.61–1.24)
GFR calc Af Amer: >60 mL/min (ref >60)
GFR calc non Af Amer: >60 mL/min (ref >60)
Glucose, Bld: 172 mg/dL — ABNORMAL HIGH (ref 70–99)
Potassium: 3.8 mmol/L (ref 3.5–5.1)
Sodium: 137 mmol/L (ref 135–145)
Total Bilirubin: 0.9 mg/dL (ref 0.3–1.2)
Total Protein: 7.1 g/dL (ref 6.5–8.1)

## 2020-03-08 LAB — CBC
HCT: 60.5 % — ABNORMAL HIGH (ref 39.0–52.0)
Hemoglobin: 19.7 g/dL — ABNORMAL HIGH (ref 13.0–17.0)
MCH: 30.2 pg (ref 26.0–34.0)
MCHC: 32.6 g/dL (ref 30.0–36.0)
MCV: 92.8 fL (ref 80.0–100.0)
Platelets: 335 10*3/uL (ref 150–400)
RBC: 6.52 MIL/uL — ABNORMAL HIGH (ref 4.22–5.81)
RDW: 14.4 % (ref 11.5–15.5)
WBC: 12.4 10*3/uL — ABNORMAL HIGH (ref 4.0–10.5)
nRBC: 0 % (ref 0.0–0.2)

## 2020-03-08 LAB — LIPASE, BLOOD: Lipase: 35 U/L (ref 11–51)

## 2020-03-08 MED ORDER — PROCHLORPERAZINE EDISYLATE 10 MG/2ML IJ SOLN
10.0000 mg | Freq: Once | INTRAMUSCULAR | Status: AC
  Administered 2020-03-08: 10 mg via INTRAVENOUS
  Filled 2020-03-08: qty 2

## 2020-03-08 MED ORDER — DIPHENHYDRAMINE HCL 50 MG/ML IJ SOLN
25.0000 mg | Freq: Once | INTRAMUSCULAR | Status: AC
  Administered 2020-03-08: 25 mg via INTRAVENOUS
  Filled 2020-03-08: qty 1

## 2020-03-08 MED ORDER — SODIUM CHLORIDE 0.9% FLUSH
3.0000 mL | Freq: Once | INTRAVENOUS | Status: AC
  Administered 2020-03-08: 3 mL via INTRAVENOUS

## 2020-03-08 MED ORDER — SODIUM CHLORIDE 0.9 % IV BOLUS
1000.0000 mL | Freq: Once | INTRAVENOUS | Status: AC
  Administered 2020-03-08: 1000 mL via INTRAVENOUS

## 2020-03-08 MED ORDER — MORPHINE SULFATE (PF) 4 MG/ML IV SOLN
4.0000 mg | Freq: Once | INTRAVENOUS | Status: AC
  Administered 2020-03-08: 4 mg via INTRAVENOUS
  Filled 2020-03-08: qty 1

## 2020-03-08 MED ORDER — ONDANSETRON 4 MG PO TBDP
4.0000 mg | ORAL_TABLET | Freq: Three times a day (TID) | ORAL | 0 refills | Status: DC | PRN
Start: 2020-03-08 — End: 2021-09-29

## 2020-03-08 MED ORDER — ONDANSETRON HCL 4 MG/2ML IJ SOLN
4.0000 mg | Freq: Once | INTRAMUSCULAR | Status: AC
  Administered 2020-03-08: 4 mg via INTRAVENOUS
  Filled 2020-03-08: qty 2

## 2020-03-08 MED ORDER — FAMOTIDINE IN NACL 20-0.9 MG/50ML-% IV SOLN
20.0000 mg | Freq: Once | INTRAVENOUS | Status: AC
  Administered 2020-03-08: 20 mg via INTRAVENOUS
  Filled 2020-03-08: qty 50

## 2020-03-08 MED ORDER — IOHEXOL 300 MG/ML  SOLN
100.0000 mL | Freq: Once | INTRAMUSCULAR | Status: AC | PRN
  Administered 2020-03-08: 100 mL via INTRAVENOUS

## 2020-03-08 NOTE — Discharge Instructions (Signed)
Return for inability to eat or drink, fever or worsening pain.   Try to avoid things that may make this worse, most commonly these are spicy foods tomato based products fatty foods chocolate and peppermint.  Alcohol and tobacco can also make this worse.  Return to the emergency department for sudden worsening pain fever or inability to eat or drink.

## 2020-03-08 NOTE — ED Provider Notes (Signed)
[facility] Provider Note   CSN: [id number] Arrival date & time: 03/08/20  0751     History Chief Complaint  Patient presents with  . Hypertension  . Emesis    Luis Henderson is a 64 y.o. male.  64 yo M with a chief complaints of intractable nausea and vomiting.  Started last night as it persisted into this morning.  Has a history of the same.  Has been taking Phenergan suppositories without improvement.  Describes some epigastric discomfort with it as well.  Described as a uncomfortable pain.  Has had his gallbladder removed previously.  Also 2 hernia repairs.  Denies fevers denies diarrhea.  Denies sick contacts or suspicious food intake.  Denies cough or congestion.  The history is provided by the patient.  Hypertension Pertinent negatives include no chest pain, no abdominal pain, no headaches and no shortness of breath.  Emesis Associated symptoms: no abdominal pain, no arthralgias, no chills, no diarrhea, no fever, no headaches and no myalgias   Illness Severity:  Severe Onset quality:  Sudden Duration:  12 hours Timing:  Constant Progression:  Unchanged Chronicity:  New Associated symptoms: nausea and vomiting   Associated symptoms: no abdominal pain, no chest pain, no congestion, no diarrhea, no fever, no headaches, no myalgias, no rash and no shortness of breath        Past Medical History:  Diagnosis Date  . Anxiety   . Depression   . Diverticulitis 04/2019  . History of kidney stones   . History of prediabetes   . Hyperlipidemia   . Hypertension   . Polycythemia     Patient Active Problem List   Diagnosis Date Noted  . Adenomatous polyp of colon 12/29/2019  . Gastritis determined by endoscopy 12/29/2019  . Bilateral inguinal hernia 08/05/2019  . Diverticulitis large intestine 05/04/2019  . Inguinal hernia 10/03/2017  . Secondary polycythemia 05/31/2017  . 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 Anxiety and  depression   . Chronic bilateral low back pain without sciatica   . ETOH abuse   . Tobacco abuse   . HLD (hyperlipidemia) 09/04/2016  . 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[place]) 09/04/2016  . Low back pain 06/09/2016  . HTN (hypertension) 04/01/2015    Past Surgical History:  Procedure Laterality Date  . CHOLECYSTECTOMY N/A 05/06/2019   Procedure: LAPAROSCOPIC CHOLECYSTECTOMY WITH INTRAOPERATIVE CHOLANGIOGRAM;  Surgeon: Donnie Mesa, MD;  Location: [place];  Service: General;  Laterality: N/A;  . COLONOSCOPY    . CYSTOSCOPY WITH RETROGRADE PYELOGRAM, URETEROSCOPY AND STENT PLACEMENT Right 01/15/2015   Procedure: CYSTOSCOPY, RIGHT URETEROSCOPY/ RETROGRADE PYELOGRAM,HOLMIUM LASER/ LITHOTRIPSY, STENT PLACEMENT, URETERAL BALLOON DIALATION;  Surgeon: Kathie Rhodes, MD;  Location: [facility];  Service: Urology;  Laterality: Right;  . INGUINAL HERNIA REPAIR Bilateral 08/05/2019   Procedure: OPEN BILATERAL INGUINAL HERNIA REPAIRS WITH MESH;  Surgeon: Donnie Mesa, MD;  Location: [place];  Service: General;  Laterality: Bilateral;  . INSERTION OF MESH Bilateral 08/05/2019   Procedure: INSERTION OF MESH;  Surgeon: Donnie Mesa, MD;  Location: [place];  Service: General;  Laterality: Bilateral;  . SKIN GRAFT     left hand  . VENTRICULOPERITONEAL SHUNT Right 09/04/2016   Procedure: SHUNT INSERTION VENTRICULAR-PERITONEAL;  Surgeon: Newman Pies, MD;  Location: [place];  Service: Neurosurgery;  Laterality: Right;  . WISDOM TOOTH EXTRACTION         Family History  Problem Relation Age of Onset  . Hyperlipidemia Father   .  Colon cancer Neg Hx   . Esophageal cancer Neg Hx   . Stomach cancer Neg Hx   . Pancreatic cancer Neg Hx     Social History   Tobacco Use  . Smoking status: Current Every Day Smoker    Packs/day: 1.00    Years: 28.00    Pack years: 28.00    Types: Cigarettes  . Smokeless tobacco: Never Used  Substance Use Topics  . Alcohol use: Yes     Alcohol/week: 2.0 - 3.0 standard drinks    Types: 2 - 3 Cans of beer per week    Comment: daily alcohol use/drinks beer, wine, and liquor  . Drug use: Yes    Types: Marijuana    Comment: 2x per week    Home Medications Prior to Admission medications   Medication Sig Start Date End Date Taking? Authorizing Provider  amLODipine (NORVASC) 10 MG tablet TAKE 0.5 TABLETS (5 MG TOTAL) BY MOUTH 2 (TWO) TIMES DAILY. Patient taking differently: Take 10 mg by mouth daily.  12/22/19  Yes Ladell Pier, MD  aspirin EC 325 MG tablet Take 162 mg by mouth daily.    Yes [provider]  promethazine (PHENERGAN) 12.5 MG suppository Place 1 suppository (12.5 mg total) rectally every 8 (eight) hours as needed for nausea or vomiting. 11/10/19  Yes Ladell Pier, MD  omeprazole (PRILOSEC) 20 MG capsule Take 1 capsule (20 mg total) by mouth daily for 28 days. Patient not taking: Reported on 03/08/2020 12/09/19 01/06/20  Doran Stabler, MD  ondansetron (ZOFRAN ODT) 4 MG disintegrating tablet Take 1 tablet (4 mg total) by mouth every 8 (eight) hours as needed for nausea or vomiting. 03/08/20   Deno Etienne, DO    Allergies    Codeine and Lisinopril  Review of Systems   Review of Systems  Constitutional: Negative for chills and fever.  HENT: Negative for congestion and facial swelling.   Eyes: Negative for discharge and visual disturbance.  Respiratory: Negative for shortness of breath.   Cardiovascular: Negative for chest pain and palpitations.  Gastrointestinal: Positive for nausea and vomiting. Negative for abdominal pain and diarrhea.  Musculoskeletal: Negative for arthralgias and myalgias.  Skin: Negative for color change and rash.  Neurological: Negative for tremors, syncope and headaches.  Psychiatric/Behavioral: Negative for confusion and dysphoric mood.    Physical Exam Updated Vital Signs BP 130/76   Pulse 71   Temp 98.8 F (37.1 C) (Oral)   Resp 17   Ht 5\' 11"  (1.803 m)    Wt 75.8 kg   SpO2 92%   BMI 23.29 kg/m   Physical Exam Vitals and nursing note reviewed.  Constitutional:      Appearance: He is well-developed.  HENT:     Head: Normocephalic and atraumatic.  Eyes:     Pupils: Pupils are equal, round, and reactive to light.  Neck:     Vascular: No JVD.  Cardiovascular:     Rate and Rhythm: Normal rate and regular rhythm.     Heart sounds: No murmur. No friction rub. No gallop.   Pulmonary:     Effort: No respiratory distress.     Breath sounds: No wheezing.  Abdominal:     General: There is no distension.     Tenderness: There is abdominal tenderness. There is no guarding or rebound.     Comments: Mild diffuse tenderness without focality  Musculoskeletal:        General: Normal range of motion.  Cervical back: Normal range of motion and neck supple.  Skin:    Coloration: Skin is not pale.     Findings: No rash.  Neurological:     Mental Status: He is alert and oriented to person, place, and time.  Psychiatric:        Behavior: Behavior normal.     ED Results / Procedures / Treatments   Labs (all labs ordered are listed, but only abnormal results are displayed) Labs Reviewed  COMPREHENSIVE METABOLIC PANEL - Abnormal; Notable for the following components:      Result Value   CO2 20 (*)    Glucose, Bld 172 (*)    All other components within normal limits  CBC - Abnormal; Notable for the following components:   WBC 12.4 (*)    RBC 6.52 (*)    Hemoglobin 19.7 (*)    HCT 60.5 (*)    All other components within normal limits  URINALYSIS, ROUTINE W REFLEX MICROSCOPIC - Abnormal; Notable for the following components:   APPearance HAZY (*)    All other components within normal limits  LIPASE, BLOOD    EKG EKG Interpretation  Date/Time:  Monday Mar 08 2020 07:54:52 EDT Ventricular Rate:  97 PR Interval:  158 QRS Duration: 112 QT Interval:  362 QTC Calculation: 459 R Axis:   105 Text Interpretation: Normal sinus rhythm  Possible Left atrial enlargement Rightward axis Incomplete right bundle branch block Borderline ECG Since last tracing rate faster Otherwise no significant change Confirmed by Deno Etienne [phone]) on 03/08/2020 8:15:38 AM   Radiology CT ABDOMEN PELVIS W CONTRAST  Result Date: 03/08/2020 CLINICAL DATA:  Epigastric pain EXAM: CT ABDOMEN AND PELVIS WITH CONTRAST TECHNIQUE: Multidetector CT imaging of the abdomen and pelvis was performed using the standard protocol following bolus administration of intravenous contrast. CONTRAST:  136mL OMNIPAQUE IOHEXOL 300 MG/ML  SOLN COMPARISON:  04/30/2019 FINDINGS: Lower chest: No acute abnormality. Hepatobiliary: No focal liver abnormality is seen. Status post interval cholecystectomy. The common bile duct measures up to 9 mm with minimal intrahepatic biliary ductal dilatation. Pancreas: Unremarkable. No pancreatic ductal dilatation or surrounding inflammatory changes. Spleen: Normal in size without significant abnormality. Adrenals/Urinary Tract: Adrenal glands are unremarkable. Large exophytic cyst of the midportion of the left kidney. Small nonobstructive calculus of the posterior midportion of the right kidney. No hydronephrosis. Bladder is unremarkable. Stomach/Bowel: Stomach is within normal limits. Appendix appears normal. No evidence of bowel wall thickening, distention, or inflammatory changes. Pancolonic diverticulosis. Vascular/Lymphatic: Aortic atherosclerosis. No enlarged abdominal or pelvic lymph nodes. Reproductive: Mild prostatomegaly. Other: Status post bilateral inguinal hernia repair. Shunt catheter tubing is gently looped about the ventral abdomen. No fluid collection or significant ascites. Musculoskeletal: No acute or significant osseous findings. IMPRESSION: 1. No acute CT findings of the abdomen or pelvis to explain epigastric pain. 2. Status post interval cholecystectomy. The common bile duct measures up to 9 mm with minimal intrahepatic biliary ductal  dilatation, likely postoperative. HIDA could be used to further assess patency of the common bile duct if there is clinical or laboratory concern for cholestasis. 3. Pancolonic diverticulosis without evidence of acute diverticulitis. 4. Nonobstructive right nephrolithiasis. 5. Mild prostatomegaly. 6. Aortic Atherosclerosis (ICD10-I70.0). Electronically Signed   By: Eddie Candle M.D.   On: 03/08/2020 12:20    Procedures Procedures (including critical care time)  Medications Ordered in ED Medications  sodium chloride flush (NS) 0.9 % injection 3 mL (3 mLs Intravenous Given 03/08/20 0852)  prochlorperazine (COMPAZINE) injection 10 mg (10  mg Intravenous Given 03/08/20 0843)  diphenhydrAMINE (BENADRYL) injection 25 mg (25 mg Intravenous Given 03/08/20 0843)  sodium chloride 0.9 % bolus 1,000 mL (0 mLs Intravenous Stopped 03/08/20 0950)  famotidine (PEPCID) IVPB 20 mg premix (0 mg Intravenous Stopped 03/08/20 0950)  morphine 4 MG/ML injection 4 mg (4 mg Intravenous Given 03/08/20 1051)  ondansetron (ZOFRAN) injection 4 mg (4 mg Intravenous Given 03/08/20 1051)  iohexol (OMNIPAQUE) 300 MG/ML solution 100 mL (100 mLs Intravenous Contrast Given 03/08/20 1149)    ED Course  I have reviewed the triage vital signs and the nursing notes.  Pertinent labs & imaging results that were available during my care of the patient were reviewed by me and considered in my medical decision making (see chart for details).    MDM Rules/Calculators/A&P                      73 y M with a chief complaints of nausea and vomiting.  Going on for about 12 hours now.  No focal abdominal tenderness.  Patient is hypertensive likely secondary to feeling unwell.  Will obtain laboratory evaluation attempt to treat his symptoms reassess.  Patient nausea significantly improved on reassessment however still continued to complain of severe pain.  Will obtain a CT scan.  Patient reassessed and feeling much better.  CT scan negative for  acute intra-abdominal pathology.  This is most likely viral in etiology versus reflux.  We will have him follow-up with his GI doctor in the office.  1:33 PM:  I have discussed the diagnosis/risks/treatment options with the patient and believe the pt to be eligible for discharge home to follow-up with PCP, GI. We also discussed returning to the ED immediately if new or worsening sx occur. We discussed the sx which are most concerning (e.g., sudden worsening pain, fever, inability to tolerate by mouth) that necessitate immediate return. Medications administered to the patient during their visit and any new prescriptions provided to the patient are listed below.  Medications given during this visit Medications  sodium chloride flush (NS) 0.9 % injection 3 mL (3 mLs Intravenous Given 03/08/20 0852)  prochlorperazine (COMPAZINE) injection 10 mg (10 mg Intravenous Given 03/08/20 0843)  diphenhydrAMINE (BENADRYL) injection 25 mg (25 mg Intravenous Given 03/08/20 0843)  sodium chloride 0.9 % bolus 1,000 mL (0 mLs Intravenous Stopped 03/08/20 0950)  famotidine (PEPCID) IVPB 20 mg premix (0 mg Intravenous Stopped 03/08/20 0950)  morphine 4 MG/ML injection 4 mg (4 mg Intravenous Given 03/08/20 1051)  ondansetron (ZOFRAN) injection 4 mg (4 mg Intravenous Given 03/08/20 1051)  iohexol (OMNIPAQUE) 300 MG/ML solution 100 mL (100 mLs Intravenous Contrast Given 03/08/20 1149)     The patient appears reasonably screen and/or stabilized for discharge and I doubt any other medical condition or other [facility] requiring further screening, evaluation, or treatment in the ED at this time prior to discharge.   Final Clinical Impression(s) / ED Diagnoses Final diagnoses:  Nausea and vomiting in adult    Rx / DC Orders ED Discharge Orders         Ordered    ondansetron (ZOFRAN ODT) 4 MG disintegrating tablet  Every 8 hours PRN     03/08/20 Rockledge, Winkler, DO 03/08/20 1333

## 2020-03-08 NOTE — ED Triage Notes (Addendum)
Pt here from home for eval of multiple episodes of emesis and abdominal pain since last night. Also sts he has been more hypertensive over the last few days, although has been compliant with medication (Amlodipine 10 mg). Used Phenergan suppository at home without relief.

## 2020-03-08 NOTE — ED Notes (Signed)
Pt actively vomiting at this time.

## 2020-03-09 DIAGNOSIS — R69 Illness, unspecified: Secondary | ICD-10-CM | POA: Diagnosis not present

## 2020-03-09 DIAGNOSIS — R5383 Other fatigue: Secondary | ICD-10-CM | POA: Diagnosis not present

## 2020-03-09 DIAGNOSIS — Z131 Encounter for screening for diabetes mellitus: Secondary | ICD-10-CM | POA: Diagnosis not present

## 2020-03-09 DIAGNOSIS — Z1159 Encounter for screening for other viral diseases: Secondary | ICD-10-CM | POA: Diagnosis not present

## 2020-03-09 DIAGNOSIS — E7800 Pure hypercholesterolemia, unspecified: Secondary | ICD-10-CM | POA: Diagnosis not present

## 2020-03-09 DIAGNOSIS — E559 Vitamin D deficiency, unspecified: Secondary | ICD-10-CM | POA: Diagnosis not present

## 2020-03-09 DIAGNOSIS — Z0000 Encounter for general adult medical examination without abnormal findings: Secondary | ICD-10-CM | POA: Diagnosis not present

## 2020-03-09 DIAGNOSIS — M129 Arthropathy, unspecified: Secondary | ICD-10-CM | POA: Diagnosis not present

## 2020-03-09 DIAGNOSIS — Z79899 Other long term (current) drug therapy: Secondary | ICD-10-CM | POA: Diagnosis not present

## 2020-03-24 DIAGNOSIS — Z0100 Encounter for examination of eyes and vision without abnormal findings: Secondary | ICD-10-CM | POA: Diagnosis not present

## 2020-03-25 DIAGNOSIS — E7800 Pure hypercholesterolemia, unspecified: Secondary | ICD-10-CM | POA: Diagnosis not present

## 2020-03-25 DIAGNOSIS — R69 Illness, unspecified: Secondary | ICD-10-CM | POA: Diagnosis not present

## 2020-03-25 DIAGNOSIS — E559 Vitamin D deficiency, unspecified: Secondary | ICD-10-CM | POA: Diagnosis not present

## 2020-03-25 DIAGNOSIS — I10 Essential (primary) hypertension: Secondary | ICD-10-CM | POA: Diagnosis not present

## 2020-03-26 ENCOUNTER — Encounter: Payer: Medicare HMO | Admitting: Family Medicine

## 2020-04-15 DIAGNOSIS — R69 Illness, unspecified: Secondary | ICD-10-CM | POA: Diagnosis not present

## 2020-04-15 DIAGNOSIS — Z888 Allergy status to other drugs, medicaments and biological substances status: Secondary | ICD-10-CM | POA: Diagnosis not present

## 2020-04-15 DIAGNOSIS — Z8249 Family history of ischemic heart disease and other diseases of the circulatory system: Secondary | ICD-10-CM | POA: Diagnosis not present

## 2020-04-15 DIAGNOSIS — I10 Essential (primary) hypertension: Secondary | ICD-10-CM | POA: Diagnosis not present

## 2020-04-15 DIAGNOSIS — Z008 Encounter for other general examination: Secondary | ICD-10-CM | POA: Diagnosis not present

## 2020-04-15 DIAGNOSIS — Z720 Tobacco use: Secondary | ICD-10-CM | POA: Diagnosis not present

## 2020-04-29 ENCOUNTER — Ambulatory Visit (HOSPITAL_BASED_OUTPATIENT_CLINIC_OR_DEPARTMENT_OTHER): Payer: Medicare HMO | Admitting: Pharmacist

## 2020-04-29 ENCOUNTER — Ambulatory Visit: Payer: Medicare HMO | Attending: Internal Medicine | Admitting: Internal Medicine

## 2020-04-29 ENCOUNTER — Encounter: Payer: Self-pay | Admitting: Internal Medicine

## 2020-04-29 ENCOUNTER — Other Ambulatory Visit: Payer: Self-pay

## 2020-04-29 VITALS — BP 170/80 | HR 76 | Resp 16 | Wt 184.0 lb

## 2020-04-29 DIAGNOSIS — R69 Illness, unspecified: Secondary | ICD-10-CM | POA: Diagnosis not present

## 2020-04-29 DIAGNOSIS — Z23 Encounter for immunization: Secondary | ICD-10-CM

## 2020-04-29 DIAGNOSIS — R4589 Other symptoms and signs involving emotional state: Secondary | ICD-10-CM

## 2020-04-29 DIAGNOSIS — R400 Somnolence: Secondary | ICD-10-CM | POA: Insufficient documentation

## 2020-04-29 DIAGNOSIS — E7800 Pure hypercholesterolemia, unspecified: Secondary | ICD-10-CM | POA: Diagnosis not present

## 2020-04-29 DIAGNOSIS — I10 Essential (primary) hypertension: Secondary | ICD-10-CM | POA: Diagnosis not present

## 2020-04-29 DIAGNOSIS — E559 Vitamin D deficiency, unspecified: Secondary | ICD-10-CM | POA: Diagnosis not present

## 2020-04-29 DIAGNOSIS — F17200 Nicotine dependence, unspecified, uncomplicated: Secondary | ICD-10-CM | POA: Diagnosis not present

## 2020-04-29 MED ORDER — LOSARTAN POTASSIUM 25 MG PO TABS: 25.0000 mg | ORAL_TABLET | Freq: Every day | ORAL | 3 refills | Status: DC

## 2020-04-29 NOTE — Progress Notes (Signed)
Patient ID: Luis Henderson, male    DOB: [birth year]  MRN: [id number]  CC: Hypertension   Subjective: Luis Henderson is a 64 y.o. male who presents for chronic ds mnagement His concerns today include:  Patient with history of HTN, tob dep, secondary polycythemia, diverticulitis,acquired obstructive hydrocephalus status post shunt placement 08/2017, preDM.  Main concern today is problems staying awake when writing.  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  He is requesting a prescription for Provigil for 3 months.  States he was prescribed it 15 years ago for the same issue.     HTN:  Took med already for today.  Norvasc 10 mg daily.  He feels his blood pressure is elevated because he sat in our waiting area this morning and the television was turned up very loud that had scenes of screaming children.  And this made him very anxious and aggravated.   -He has been checking blood pressure at home and reports that it has been slightly elevated.   - not taking a lot of salt  Tob dep:  "I've given up trying to quit.  I was not able to do it and I have given up.  I don't care anymore.  Depressing not being able to write and not having the support I need."  No SI/HI  PreDM: reports having had recent A1C done at [facility] 2 mths ago and it was 5.6  Patient Active Problem List   Diagnosis Date Noted  . Intermittent sleepiness 04/29/2020  . Depressed mood 04/29/2020  . Adenomatous polyp of colon 12/29/2019  . Gastritis determined by endoscopy 12/29/2019  . Bilateral inguinal hernia 08/05/2019  . Diverticulitis large intestine 05/04/2019  . Inguinal hernia 10/03/2017  . Secondary polycythemia 05/31/2017  . 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 Anxiety and depression   . Chronic bilateral low back pain without sciatica   . ETOH abuse   . Tobacco abuse   . HLD (hyperlipidemia) 09/04/2016  . Acquired obstructive hydrocephalus  ([place]) 09/04/2016  . Low back pain 06/09/2016  . HTN (hypertension) 04/01/2015     Current Outpatient Medications on File Prior to Visit  Medication Sig Dispense Refill  . amLODipine (NORVASC) 10 MG tablet TAKE 0.5 TABLETS (5 MG TOTAL) BY MOUTH 2 (TWO) TIMES DAILY. (Patient taking differently: Take 10 mg by mouth daily. ) 90 tablet 1  . aspirin EC 325 MG tablet Take 162 mg by mouth daily.     Marland Kitchen omeprazole (PRILOSEC) 20 MG capsule Take 1 capsule (20 mg total) by mouth daily for 28 days. (Patient not taking: Reported on 03/08/2020) 30 capsule 0  . ondansetron (ZOFRAN ODT) 4 MG disintegrating tablet Take 1 tablet (4 mg total) by mouth every 8 (eight) hours as needed for nausea or vomiting. 20 tablet 0  . promethazine (PHENERGAN) 12.5 MG suppository Place 1 suppository (12.5 mg total) rectally every 8 (eight) hours as needed for nausea or vomiting. 12 each 2   No current facility-administered medications on file prior to visit.    Allergies  Allergen Reactions  . Codeine Itching  . Lisinopril Cough    Social History   Socioeconomic History  . Marital status: Divorced    Spouse name: Not on file  . Number of children: 2  . Years of education: Not on file  . Highest education level: Not on file  Occupational History    Employer: [employer]  Comment: Radio broadcast assistant  Tobacco Use  . Smoking status: Current Every Day Smoker    Packs/day: 1.00    Years: 28.00    Pack years: 28.00    Types: Cigarettes  . Smokeless tobacco: Never Used  Vaping Use  . Vaping Use: Some days  Substance and Sexual Activity  . Alcohol use: Yes    Alcohol/week: 2.0 - 3.0 standard drinks    Types: 2 - 3 Cans of beer per week    Comment: daily alcohol use/drinks beer, wine, and liquor  . Drug use: Yes    Types: Marijuana    Comment: 2x per week  . Sexual activity: Not Currently  Other Topics Concern  . Not on file  Social History Narrative  . Not on file   Social Determinants of Health   Financial  Resource Strain:   . Difficulty of Paying Living Expenses:   Food Insecurity:   . Worried About Charity fundraiser in the Last Year:   . Arboriculturist in the Last Year:   Transportation Needs:   . Film/video editor (Medical):   Marland Kitchen Lack of Transportation (Non-Medical):   Physical Activity:   . Days of Exercise per Week:   . Minutes of Exercise per Session:   Stress:   . Feeling of Stress :   Social Connections:   . Frequency of Communication with Friends and Family:   . Frequency of Social Gatherings with Friends and Family:   . Attends Religious Services:   . Active Member of Clubs or Organizations:   . Attends Archivist Meetings:   Marland Kitchen Marital Status:   Intimate Partner Violence:   . Fear of Current or Ex-Partner:   . Emotionally Abused:   Marland Kitchen Physically Abused:   . Sexually Abused:     Family History  Problem Relation Age of Onset  . Hyperlipidemia Father   . Colon cancer Neg Hx   . Esophageal cancer Neg Hx   . Stomach cancer Neg Hx   . Pancreatic cancer Neg Hx     Past Surgical History:  Procedure Laterality Date  . CHOLECYSTECTOMY N/A 05/06/2019   Procedure: LAPAROSCOPIC CHOLECYSTECTOMY WITH INTRAOPERATIVE CHOLANGIOGRAM;  Surgeon: Donnie Mesa, MD;  Location: [place];  Service: General;  Laterality: N/A;  . COLONOSCOPY    . CYSTOSCOPY WITH RETROGRADE PYELOGRAM, URETEROSCOPY AND STENT PLACEMENT Right 01/15/2015   Procedure: CYSTOSCOPY, RIGHT URETEROSCOPY/ RETROGRADE PYELOGRAM,HOLMIUM LASER/ LITHOTRIPSY, STENT PLACEMENT, URETERAL BALLOON DIALATION;  Surgeon: Kathie Rhodes, MD;  Location[facility];  Service: Urology;  Laterality: Right;  . INGUINAL HERNIA REPAIR Bilateral 08/05/2019   Procedure: OPEN BILATERAL INGUINAL HERNIA REPAIRS WITH MESH;  Surgeon: Donnie Mesa, MD;  Location: [place];  Service: General;  Laterality: Bilateral;  . INSERTION OF MESH Bilateral 08/05/2019   Procedure: INSERTION OF MESH;  Surgeon: Donnie Mesa, MD;   Location: [place];  Service: General;  Laterality: Bilateral;  . SKIN GRAFT     left hand  . VENTRICULOPERITONEAL SHUNT Right 09/04/2016   Procedure: SHUNT INSERTION VENTRICULAR-PERITONEAL;  Surgeon: Newman Pies, MD;  Location: [place];  Service: Neurosurgery;  Laterality: Right;  . WISDOM TOOTH EXTRACTION      ROS: Review of Systems Negative except as stated above  PHYSICAL EXAM: BP (!) 170/80   Pulse 76   Resp 16   Wt 184 lb (83.5 kg)   SpO2 97%   BMI 25.66 kg/m   Wt Readings from Last 3 Encounters:  04/29/20 184 lb (83.5 kg)  03/08/20 167 lb (75.8 kg)  12/10/19 173 lb 12.8 oz (78.8 kg)    Physical Exam  General appearance - alert, well appearing, and in no distress Mental status -patient became withdrawn and less engaging after my discussion with him about the Provigil request  Neck - supple, no significant adenopathy Chest - clear to auscultation, no wheezes, rales or rhonchi, symmetric air entry Heart - normal rate, regular rhythm, normal S1, S2, no murmurs, rubs, clicks or gallops Extremities - peripheral pulses normal, no pedal edema, no clubbing or cyanosis Depression screen [facility] 2/9 04/29/2020 12/29/2019 11/10/2019  Decreased Interest 0 0 0  Down, Depressed, Hopeless 1 0 0  PHQ - 2 Score 1 0 0  Altered sleeping - - -  Tired, decreased energy - - -  Change in appetite - - -  Feeling bad or failure about yourself  - - -  Trouble concentrating - - -  Moving slowly or fidgety/restless - - -  Suicidal thoughts - - -  PHQ-9 Score - - -  Some recent data might be hidden    CMP Latest Ref Rng & Units 03/08/2020 11/12/2019 11/10/2019  Glucose 70 - 99 mg/dL 172(H) - 154(H)  BUN 8 - 23 mg/dL 13 - 18  Creatinine 0.61 - 1.24 mg/dL 0.93 - 0.99  Sodium 135 - 145 mmol/L 137 - 143  Potassium 3.5 - 5.1 mmol/L 3.8 - 5.0  Chloride 98 - 111 mmol/L 104 - 100  CO2 22 - 32 mmol/L 20(L) - 24  Calcium 8.9 - 10.3 mg/dL 9.5 9.7 10.6(H)  Total Protein 6.5 - 8.1 g/dL  7.1 - 7.1  Total Bilirubin 0.3 - 1.2 mg/dL 0.9 - 0.5  Alkaline Phos 38 - 126 U/L 70 - 89  AST 15 - 41 U/L 21 - 22  ALT 0 - 44 U/L 22 - 19   Lipid Panel     Component Value Date/Time   CHOL 178 04/14/2016 1002   TRIG 140 04/14/2016 1002   HDL 47 04/14/2016 1002   CHOLHDL 3.8 04/14/2016 1002   VLDL 28 04/14/2016 1002   LDLCALC 103 04/14/2016 1002    CBC    Component Value Date/Time   WBC 12.4 (H) 03/08/2020 0801   RBC 6.52 (H) 03/08/2020 0801   HGB 19.7 (H) 03/08/2020 0801   HGB 20.3 (HH) 11/10/2019 0943   HCT 60.5 (H) 03/08/2020 0801   HCT 59.0 (H) 11/10/2019 0943   PLT 335 03/08/2020 0801   PLT 315 11/10/2019 0943   MCV 92.8 03/08/2020 0801   MCV 86 11/10/2019 0943   MCH 30.2 03/08/2020 0801   MCHC 32.6 03/08/2020 0801   RDW 14.4 03/08/2020 0801   RDW 17.6 (H) 11/10/2019 0943   LYMPHSABS 1.2 05/03/2019 1148   LYMPHSABS 2.5 05/09/2017 1554   MONOABS 0.9 05/03/2019 1148   EOSABS 0.2 05/03/2019 1148   EOSABS 0.3 05/09/2017 1554   BASOSABS 0.1 05/03/2019 1148   BASOSABS 0.1 05/09/2017 1554    ASSESSMENT AND PLAN: 1. Essential hypertension Not at goal Continue Norvasc.  Add low dose Cozaar.  Encouraged to continue home blood pressure monitoring with goal being 130/80 or lower.  Advised to limit salt in the foods. - losartan (COZAAR) 25 MG tablet; Take 1 tablet (25 mg total) by mouth daily.  Dispense: 30 tablet; Refill: 3  2. Intermittent sleepiness I recommend further work-up which would include a sleep study or referring him to a sleep specialist rather than prescribing  Provigil at this time.  Patient states that he is tired going to a specialist and was upset feeling that I was not helping him.  3. Tobacco dependence Not ready to give a trial of quitting.  4. Depressed mood Which he relates to #2.  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  Declines any therapy at this time  5. Need for  vaccination against Streptococcus pneumoniae using pneumococcal conjugate vaccine 13 Given today  Patient was given the opportunity to ask questions.  Patient verbalized understanding of the plan and was able to repeat key elements of the plan.   No orders of the defined types were placed in this encounter.    Requested Prescriptions   Signed Prescriptions Disp Refills  . losartan (COZAAR) 25 MG tablet 30 tablet 3    Sig: Take 1 tablet (25 mg total) by mouth daily.    Return in about 4 months (around 08/30/2020).  Karle Plumber, MD, FACP

## 2020-04-29 NOTE — Patient Instructions (Addendum)
Your blood pressure is not at goal of 130/80 or lower.  We have added an new blood pressure medication called Cozaar 25 mg daily.  Continue to check blood pressure at home at least twice a week.   Pneumococcal Conjugate Vaccine (PCV13): What You Need to Know 1. Why get vaccinated? Pneumococcal conjugate vaccine (PCV13) can prevent pneumococcal disease. Pneumococcal disease refers to any illness caused by pneumococcal bacteria. These bacteria can cause many types of illnesses, including pneumonia, which is an infection of the lungs. Pneumococcal bacteria are one of the most common causes of pneumonia. Besides pneumonia, pneumococcal bacteria can also cause:  Ear infections  Sinus infections  Meningitis (infection of the tissue covering the brain and spinal cord)  Bacteremia (bloodstream infection) Anyone can get pneumococcal disease, but children under 80 years of age, people with certain medical conditions, adults 50 years or older, and cigarette smokers are at the highest risk. Most pneumococcal infections are mild. However, some can result in long-term problems, such as brain damage or hearing loss. Meningitis, bacteremia, and pneumonia caused by pneumococcal disease can be fatal. 2. PCV13 PCV13 protects against 13 types of bacteria that cause pneumococcal disease. Infants and young children usually need 4 doses of pneumococcal conjugate vaccine, at 2, 4, 6, and 44-46 months of age. In some cases, a child might need fewer than 4 doses to complete PCV13 vaccination. A dose of PCV23 vaccine is also recommended for anyone 2 years or older with certain medical conditions if they did not already receive PCV13. This vaccine may be given to adults 58 years or older based on discussions between the patient and health care provider. 3. Talk with your health care provider Tell your vaccine provider if the person getting the vaccine:  Has had an allergic reaction after a previous dose of PCV13, to an  earlier pneumococcal conjugate vaccine known as PCV7, or to any vaccine containing diphtheria toxoid (for example, DTaP), or has any severe, life-threatening allergies.  In some cases, your health care provider may decide to postpone PCV13 vaccination to a future visit. People with minor illnesses, such as a cold, may be vaccinated. People who are moderately or severely ill should usually wait until they recover before getting PCV13. Your health care provider can give you more information. 4. Risks of a vaccine reaction  Redness, swelling, pain, or tenderness where the shot is given, and fever, loss of appetite, fussiness (irritability), feeling tired, headache, and chills can happen after PCV13. Young children may be at increased risk for seizures caused by fever after PCV13 if it is administered at the same time as inactivated influenza vaccine. Ask your health care provider for more information. People sometimes faint after medical procedures, including vaccination. Tell your provider if you feel dizzy or have vision changes or ringing in the ears. As with any medicine, there is a very remote chance of a vaccine causing a severe allergic reaction, other serious injury, or death. 5. What if there is a serious problem? An allergic reaction could occur after the vaccinated person leaves the clinic. If you see signs of a severe allergic reaction (hives, swelling of the face and throat, difficulty breathing, a fast heartbeat, dizziness, or weakness), call 9-1-1 and get the person to the nearest hospital. For other signs that concern you, call your health care provider. Adverse reactions should be reported to the Vaccine Adverse Event Reporting System (VAERS). Your health care provider will usually file this report, or you can do it yourself. Visit  the VAERS website at www.vaers.SamedayNews.es or call (703)511-7555. VAERS is only for reporting reactions, and VAERS staff do not give medical advice. 6. The  National Vaccine Injury Compensation Program The [employer] Vaccine Injury Compensation Program (VICP) is a federal program that was created to compensate people who may have been injured by certain vaccines. Visit the VICP website at GoldCloset.com.ee or call 508-640-8953 to learn about the program and about filing a claim. There is a time limit to file a claim for compensation. 7. How can I learn more?  Ask your health care provider.  Call your local or state health department.  Contact the Centers for Disease Control and Prevention (CDC): ? Call 239-214-5546 (1-800-CDC-INFO) or ? Visit CDC's website at http://hunter.com/ Vaccine Information Statement PCV13 Vaccine (08/14/2018) This information is not intended to replace advice given to you by your health care provider. Make sure you discuss any questions you have with your health care provider. Document Revised: 01/21/2019 Document Reviewed: 05/14/2018 Elsevier Patient Education  [place].

## 2020-04-29 NOTE — Progress Notes (Signed)
After obtaining consent, and per orders of Dr. Wynetta Emery, injection of Prevnar given by Tresa Endo. Patient instructed to remain in clinic for 20 minutes afterwards, and to report any adverse reaction to me immediately.

## 2020-07-02 ENCOUNTER — Other Ambulatory Visit: Payer: Self-pay | Admitting: Internal Medicine

## 2020-07-02 DIAGNOSIS — I10 Essential (primary) hypertension: Secondary | ICD-10-CM

## 2020-07-16 IMAGING — DX DG ABDOMEN ACUTE W/ 1V CHEST
2 series · 2 of 2 positions shown · non-contrast
Comparison: KUB 05/31/2017 and chest x-ray 05/31/2017

CLINICAL DATA: Abdominal pain and vomiting since last night.
Hypertension. Dizziness.

EXAM:
DG ABDOMEN ACUTE W/ 1V CHEST

[w chest pa]
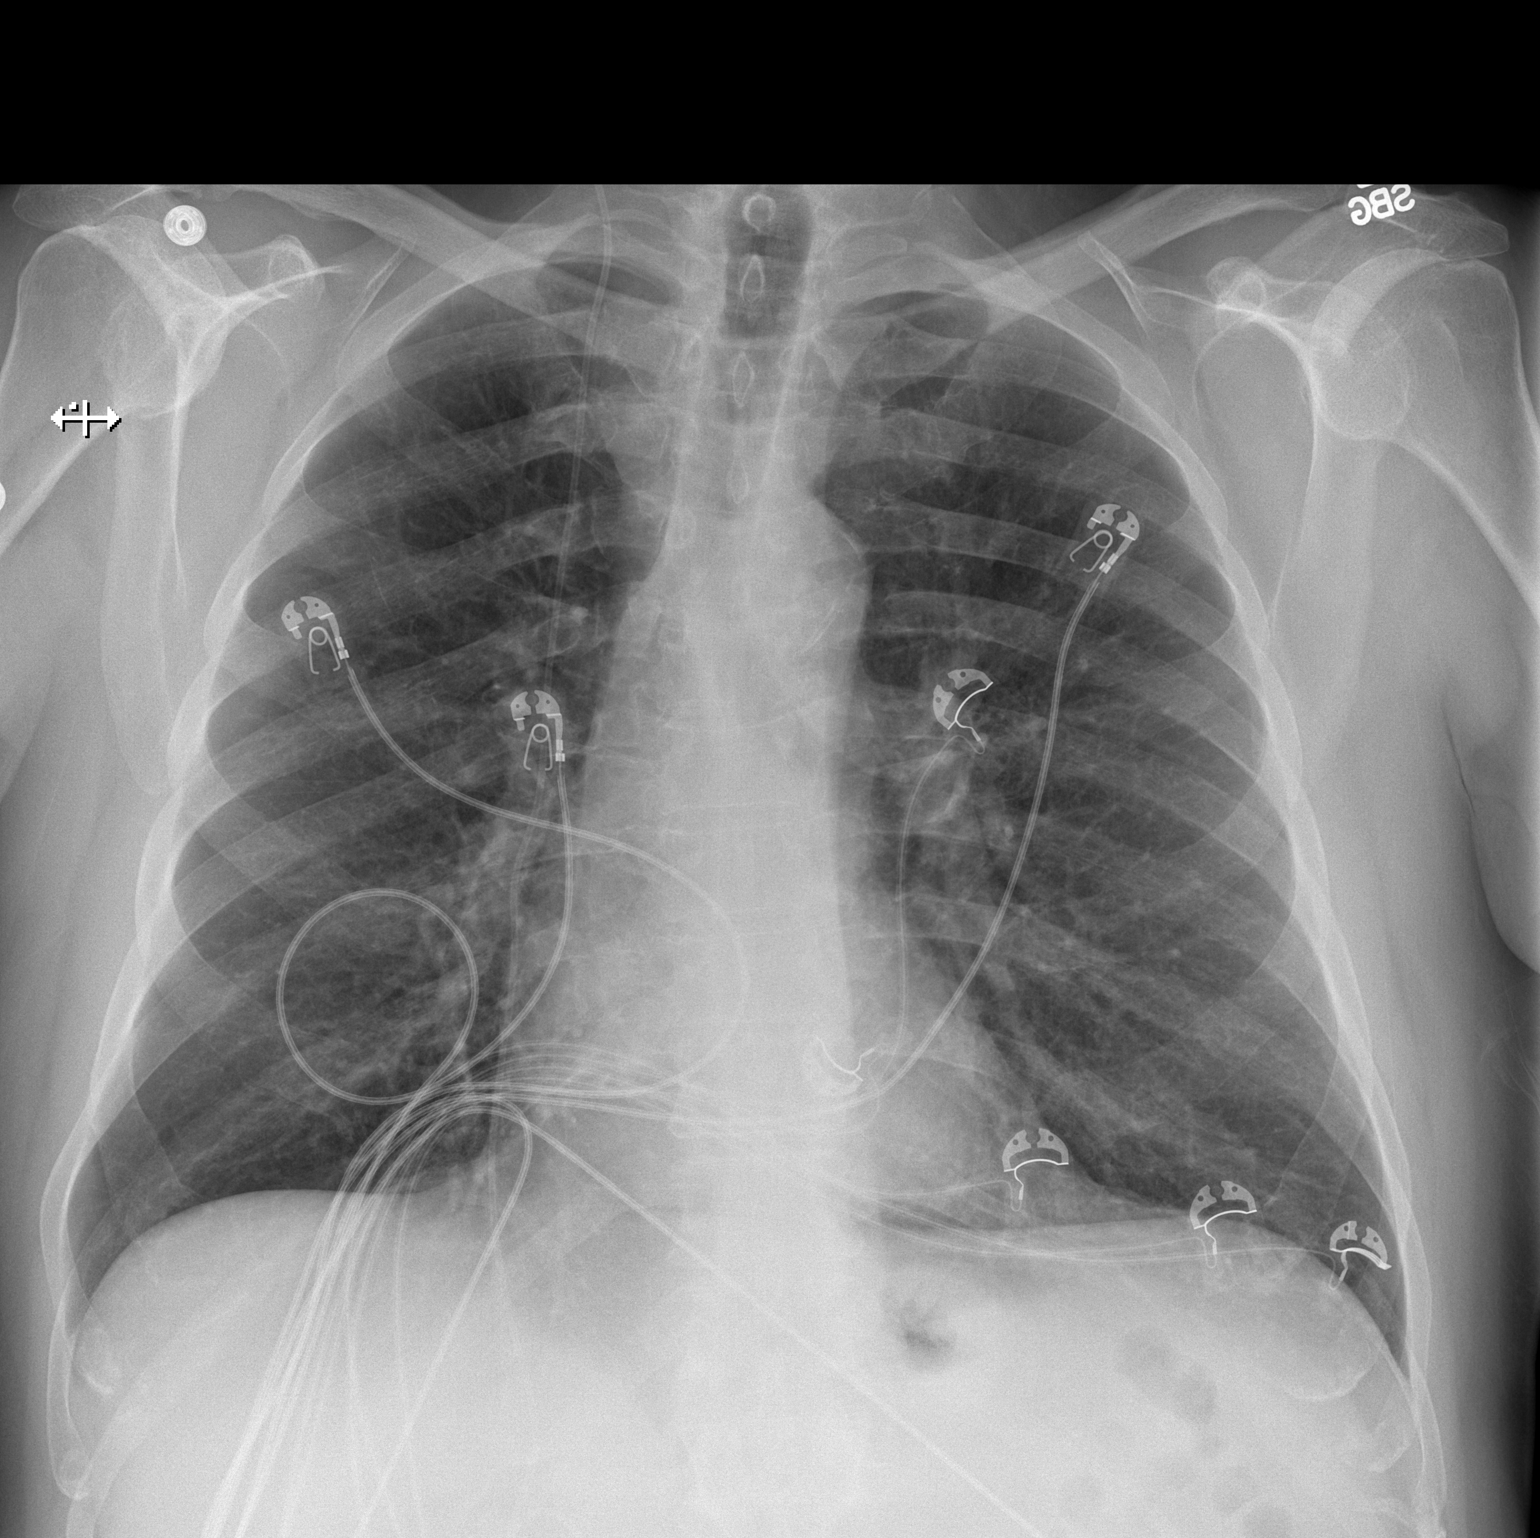

[w abdomen upright]
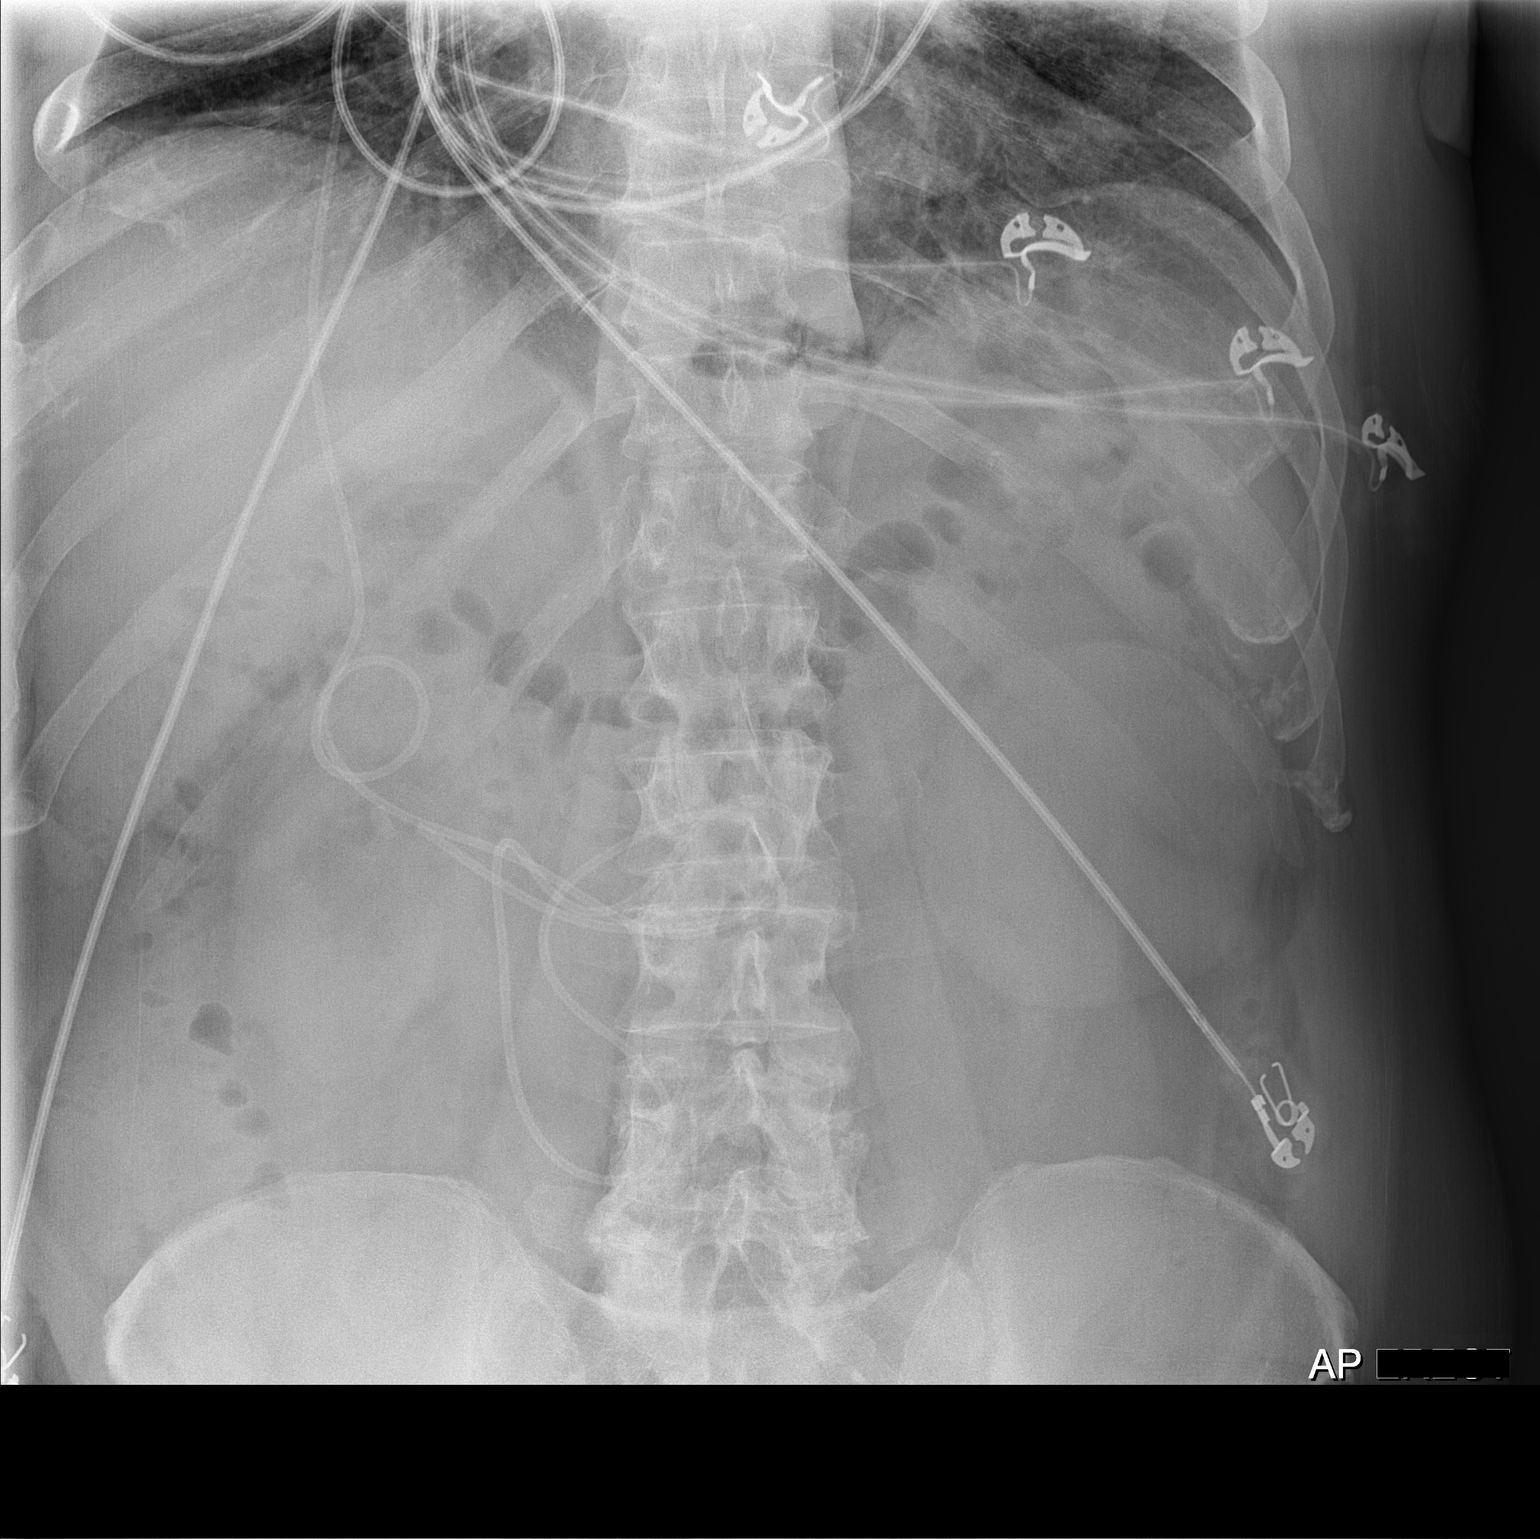

[2 of 2 positions shown; findings below may reference images not displayed]

FINDINGS: Lungs are adequately inflated and otherwise clear. Right-sided
ventriculostomy catheter unchanged. Cardiomediastinal silhouette and
remainder of the thorax is unchanged.

Abdominopelvic images demonstrate a nonobstructive bowel gas
pattern. No free peritoneal air. Ventriculostomy catheter is coiled
over the central abdomen without significant change. Rounded 10 cm
mass over the left abdomen compatible with known large left renal
cyst. No 9 mm right renal stone. Remainder the exam is unchanged.
IMPRESSION: No acute cardiopulmonary disease.  Nonobstructive bowel gas pattern.

Right-sided nephrolithiasis.  Known large left renal cyst.

Ventriculostomy catheter unchanged.

## 2020-08-09 ENCOUNTER — Telehealth: Payer: Self-pay

## 2020-08-09 NOTE — Telephone Encounter (Signed)
PA FOR PROMETHAZINE SUPPOSITORIES DENIED THRU INS.

## 2020-09-05 ENCOUNTER — Other Ambulatory Visit: Payer: Self-pay

## 2020-09-05 ENCOUNTER — Emergency Department (HOSPITAL_COMMUNITY)
Admission: EM | Admit: 2020-09-05 | Discharge: 2020-09-06 | Disposition: A | Discharge status: Home / Self Care | Payer: Medicare HMO | Attending: Emergency Medicine | Admitting: Emergency Medicine

## 2020-09-05 ENCOUNTER — Encounter (HOSPITAL_COMMUNITY): Payer: Self-pay | Admitting: Emergency Medicine

## 2020-09-05 DIAGNOSIS — R197 Diarrhea, unspecified: Secondary | ICD-10-CM | POA: Diagnosis not present

## 2020-09-05 DIAGNOSIS — R1010 Upper abdominal pain, unspecified: Secondary | ICD-10-CM | POA: Insufficient documentation

## 2020-09-05 DIAGNOSIS — Z5321 Procedure and treatment not carried out due to patient leaving prior to being seen by health care provider: Secondary | ICD-10-CM | POA: Insufficient documentation

## 2020-09-05 DIAGNOSIS — R112 Nausea with vomiting, unspecified: Secondary | ICD-10-CM | POA: Diagnosis not present

## 2020-09-05 LAB — COMPREHENSIVE METABOLIC PANEL
ALT: 31 U/L (ref 0–44)
AST: 36 U/L (ref 15–41)
Albumin: 4.4 g/dL (ref 3.5–5.0)
Alkaline Phosphatase: 67 U/L (ref 38–126)
Anion gap: 18 — ABNORMAL HIGH (ref 5–15)
BUN: 17 mg/dL (ref 8–23)
CO2: 22 mmol/L (ref 22–32)
Calcium: 10 mg/dL (ref 8.9–10.3)
Chloride: 97 mmol/L — ABNORMAL LOW (ref 98–111)
Creatinine, Ser: 1.32 mg/dL — ABNORMAL HIGH (ref 0.61–1.24)
GFR, Estimated: 60 mL/min — ABNORMAL LOW (ref >60)
Glucose, Bld: 170 mg/dL — ABNORMAL HIGH (ref 70–99)
Potassium: 3.5 mmol/L (ref 3.5–5.1)
Sodium: 137 mmol/L (ref 135–145)
Total Bilirubin: 1.3 mg/dL — ABNORMAL HIGH (ref 0.3–1.2)
Total Protein: 7 g/dL (ref 6.5–8.1)

## 2020-09-05 LAB — URINALYSIS, ROUTINE W REFLEX MICROSCOPIC
Bilirubin Urine: NEGATIVE
Glucose, UA: NEGATIVE mg/dL
Ketones, ur: 20 mg/dL — AB
Leukocytes,Ua: NEGATIVE
Nitrite: NEGATIVE
Protein, ur: 100 mg/dL — AB
Specific Gravity, Urine: 1.027 (ref 1.005–1.030)
pH: 5 (ref 5.0–8.0)

## 2020-09-05 LAB — LIPASE, BLOOD: Lipase: 37 U/L (ref 11–51)

## 2020-09-05 MED ORDER — ONDANSETRON 4 MG PO TBDP
4.0000 mg | ORAL_TABLET | Freq: Once | ORAL | Status: AC | PRN
  Administered 2020-09-05: 4 mg via ORAL
  Filled 2020-09-05: qty 1

## 2020-09-05 NOTE — ED Triage Notes (Signed)
Pt to ED with c/o upper abd pain, nausea, vomiting and diarrhea x's 4 days.

## 2020-09-05 NOTE — ED Notes (Signed)
Pt LWBS 

## 2020-09-06 ENCOUNTER — Telehealth: Payer: Self-pay | Admitting: Gastroenterology

## 2020-09-06 LAB — CBC
HCT: 62.7 % — ABNORMAL HIGH (ref 39.0–52.0)
Hemoglobin: 21.3 g/dL (ref 13.0–17.0)
MCH: 31 pg (ref 26.0–34.0)
MCHC: 34 g/dL (ref 30.0–36.0)
MCV: 91.3 fL (ref 80.0–100.0)
Platelets: 309 10*3/uL (ref 150–400)
RBC: 6.87 MIL/uL — ABNORMAL HIGH (ref 4.22–5.81)
RDW: 13.7 % (ref 11.5–15.5)
WBC: 11.3 10*3/uL — ABNORMAL HIGH (ref 4.0–10.5)
nRBC: 0 % (ref 0.0–0.2)

## 2020-09-06 NOTE — Telephone Encounter (Signed)
Dr. Danis, please advise. Thank you °

## 2020-09-06 NOTE — Telephone Encounter (Signed)
I last saw this patient for endoscopic procedures last February. It appears that he went to the ED for some reason yesterday but left without being seen.  Some lab work was done in the triage area.  I do not know what his question is regarding the labs, but if it is regarding his significantly elevated hemoglobin and hematocrit, then he needs to speak with his primary care provider about that, and I recommend he contact their office today.  If he has questions or concerns related to digestive or liver issues, he is welcome to make an appointment to see me.

## 2020-09-06 NOTE — Telephone Encounter (Signed)
Spoke with patient in regards to Dr. Loletha Carrow recommendations. Pt states that he is scheduled for an appt in January, he then states "I guess my vomiting and not eating for 5 days is not a GI issue, it is like pulling teeth to see a doctor now a days" I stated that was not the case and tried to offer a sooner appt then patient hung up on me.

## 2020-09-17 ENCOUNTER — Other Ambulatory Visit: Payer: Self-pay | Admitting: Internal Medicine

## 2020-09-17 DIAGNOSIS — I10 Essential (primary) hypertension: Secondary | ICD-10-CM

## 2020-10-13 DIAGNOSIS — I10 Essential (primary) hypertension: Secondary | ICD-10-CM | POA: Diagnosis not present

## 2020-10-13 DIAGNOSIS — D751 Secondary polycythemia: Secondary | ICD-10-CM | POA: Diagnosis not present

## 2020-10-13 DIAGNOSIS — G4721 Circadian rhythm sleep disorder, delayed sleep phase type: Secondary | ICD-10-CM | POA: Diagnosis not present

## 2020-10-21 ENCOUNTER — Other Ambulatory Visit: Payer: Self-pay | Admitting: Internal Medicine

## 2020-10-21 ENCOUNTER — Ambulatory Visit: Payer: Medicare HMO | Admitting: Gastroenterology

## 2020-10-21 DIAGNOSIS — K029 Dental caries, unspecified: Secondary | ICD-10-CM | POA: Diagnosis not present

## 2020-10-21 DIAGNOSIS — L0291 Cutaneous abscess, unspecified: Secondary | ICD-10-CM | POA: Diagnosis not present

## 2020-10-21 DIAGNOSIS — I10 Essential (primary) hypertension: Secondary | ICD-10-CM

## 2020-10-21 DIAGNOSIS — R8290 Unspecified abnormal findings in urine: Secondary | ICD-10-CM | POA: Diagnosis not present

## 2020-10-21 DIAGNOSIS — R7309 Other abnormal glucose: Secondary | ICD-10-CM | POA: Diagnosis not present

## 2020-10-21 DIAGNOSIS — D751 Secondary polycythemia: Secondary | ICD-10-CM | POA: Diagnosis not present

## 2020-10-21 DIAGNOSIS — K08109 Complete loss of teeth, unspecified cause, unspecified class: Secondary | ICD-10-CM | POA: Diagnosis not present

## 2020-10-22 DIAGNOSIS — R0902 Hypoxemia: Secondary | ICD-10-CM | POA: Diagnosis not present

## 2020-11-10 ENCOUNTER — Telehealth: Payer: Self-pay | Admitting: Hematology and Oncology

## 2020-11-10 NOTE — Telephone Encounter (Signed)
Scheduled follow-up appointment per 1/25 schedule message. Patient is aware. °

## 2020-11-29 ENCOUNTER — Inpatient Hospital Stay: Payer: Medicare Other | Attending: Hematology and Oncology | Admitting: Hematology and Oncology

## 2020-11-29 ENCOUNTER — Other Ambulatory Visit: Payer: Self-pay

## 2020-11-29 ENCOUNTER — Inpatient Hospital Stay: Payer: Medicare Other

## 2020-11-29 ENCOUNTER — Other Ambulatory Visit: Payer: Self-pay | Admitting: Hematology and Oncology

## 2020-11-29 VITALS — BP 130/58 | HR 66 | Temp 98.9°F | Resp 18 | Ht 71.0 in | Wt 185.2 lb

## 2020-11-29 DIAGNOSIS — Z885 Allergy status to narcotic agent status: Secondary | ICD-10-CM | POA: Insufficient documentation

## 2020-11-29 DIAGNOSIS — F17210 Nicotine dependence, cigarettes, uncomplicated: Secondary | ICD-10-CM | POA: Insufficient documentation

## 2020-11-29 DIAGNOSIS — D751 Secondary polycythemia: Secondary | ICD-10-CM

## 2020-11-29 DIAGNOSIS — Z7289 Other problems related to lifestyle: Secondary | ICD-10-CM | POA: Insufficient documentation

## 2020-11-29 DIAGNOSIS — Z9049 Acquired absence of other specified parts of digestive tract: Secondary | ICD-10-CM | POA: Diagnosis not present

## 2020-11-29 DIAGNOSIS — Z87442 Personal history of urinary calculi: Secondary | ICD-10-CM | POA: Insufficient documentation

## 2020-11-29 DIAGNOSIS — Z720 Tobacco use: Secondary | ICD-10-CM

## 2020-11-29 DIAGNOSIS — Z79899 Other long term (current) drug therapy: Secondary | ICD-10-CM | POA: Insufficient documentation

## 2020-11-29 DIAGNOSIS — Z888 Allergy status to other drugs, medicaments and biological substances status: Secondary | ICD-10-CM | POA: Insufficient documentation

## 2020-11-29 DIAGNOSIS — Z8349 Family history of other endocrine, nutritional and metabolic diseases: Secondary | ICD-10-CM | POA: Insufficient documentation

## 2020-11-29 LAB — CMP (CANCER CENTER ONLY)
ALT: 18 U/L (ref 0–44)
AST: 23 U/L (ref 15–41)
Albumin: 4.4 g/dL (ref 3.5–5.0)
Alkaline Phosphatase: 71 U/L (ref 38–126)
Anion gap: 7 (ref 5–15)
BUN: 18 mg/dL (ref 8–23)
CO2: 22 mmol/L (ref 22–32)
Calcium: 9.2 mg/dL (ref 8.9–10.3)
Chloride: 110 mmol/L (ref 98–111)
Creatinine: 0.96 mg/dL (ref 0.61–1.24)
GFR, Estimated: >60 mL/min
Glucose, Bld: 116 mg/dL — ABNORMAL HIGH (ref 70–99)
Potassium: 3.9 mmol/L (ref 3.5–5.1)
Sodium: 139 mmol/L (ref 135–145)
Total Bilirubin: 0.6 mg/dL (ref 0.3–1.2)
Total Protein: 6.9 g/dL (ref 6.5–8.1)

## 2020-11-29 LAB — CBC WITH DIFFERENTIAL (CANCER CENTER ONLY)
Abs Immature Granulocytes: 0.04 10*3/uL (ref 0.00–0.07)
Basophils Absolute: 0.1 10*3/uL (ref 0.0–0.1)
Basophils Relative: 1 %
Eosinophils Absolute: 0.5 10*3/uL (ref 0.0–0.5)
Eosinophils Relative: 5 %
HCT: 51.6 % (ref 39.0–52.0)
Hemoglobin: 17.3 g/dL — ABNORMAL HIGH (ref 13.0–17.0)
Immature Granulocytes: 0 %
Lymphocytes Relative: 26 %
Lymphs Abs: 2.5 10*3/uL (ref 0.7–4.0)
MCH: 30.4 pg (ref 26.0–34.0)
MCHC: 33.5 g/dL (ref 30.0–36.0)
MCV: 90.7 fL (ref 80.0–100.0)
Monocytes Absolute: 0.9 10*3/uL (ref 0.1–1.0)
Monocytes Relative: 10 %
Neutro Abs: 5.7 10*3/uL (ref 1.7–7.7)
Neutrophils Relative %: 58 %
Platelet Count: 262 10*3/uL (ref 150–400)
RBC: 5.69 MIL/uL (ref 4.22–5.81)
RDW: 14.4 % (ref 11.5–15.5)
WBC Count: 9.7 10*3/uL (ref 4.0–10.5)
nRBC: 0 % (ref 0.0–0.2)

## 2020-11-30 ENCOUNTER — Encounter: Payer: Self-pay | Admitting: Hematology and Oncology

## 2020-11-30 MED ORDER — ONDANSETRON HCL 8 MG PO TABS
ORAL_TABLET | ORAL | Status: AC
  Filled 2020-11-30: qty 1

## 2020-11-30 NOTE — Progress Notes (Signed)
[facility] Telephone:([phone]   [phone]  PROGRESS NOTE  Patient Care Team: Ladell Pier, MD as PCP - General (Internal Medicine)  Hematological/Oncological History # Polycythemia, Secondary   1) 08/24/2012: WBC 7.0, Hgb 17.2, Plt 228, MCV 91. First CBC on record.   2) 01/11/2015: WBC 12.2, Hgb 18.4, Plt 250, MCV 88.2.   3) 05/07/2017: Hgb 21.2, HCT 62  4)  11/16/2017: genetic testing was JAK2, MPL, and CALR negative. WBC 11.1, Hgb 18.2, Plt 427. Erythropoietin 3.5.   5) 11/10/2019: WBC 9.5, Hgb 20.3, MCV 86, Plt 315 6) 11/27/2019: Establish care with Dr. Lorenso Courier 7) 11/29/2020: WBC 9.7, Hgb 17.3, MCV 90.7, Plt 262   Interval History:  MAYCOL HOYING 65 y.o. 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 The patient's last visit was on 11/27/2019. In the interim since the last visit he has continued smoking 1 ppd and had no other major changes in his health.  On exam today Mr. Cimo notes that he is working at [employer] and walks a lot throughout the day. He continues to smoke at 1 ppd. He also reports he was recently evaluated at a sleep center and found not to have sleep apnea. He has some itching of his back, but no chest pain, shortness of breath, or leg swelling. He denies any fevers, chills, sweats, nausea, vomiting, or diarrhea.  A full 10 point ROS is listed below.  MEDICAL HISTORY:  Past Medical History:  Diagnosis Date  . Anxiety   . Depression   . Diverticulitis 04/2019  . History of kidney stones   . History of prediabetes   . Hyperlipidemia   . Hypertension   . Polycythemia     SURGICAL HISTORY: Past Surgical History:  Procedure Laterality Date  . CHOLECYSTECTOMY N/A 05/06/2019   Procedure: LAPAROSCOPIC CHOLECYSTECTOMY WITH INTRAOPERATIVE CHOLANGIOGRAM;  Surgeon: Donnie Mesa, MD;  Location: [facility];  Service: General;  Laterality: N/A;  . COLONOSCOPY    .  CYSTOSCOPY WITH RETROGRADE PYELOGRAM, URETEROSCOPY AND STENT PLACEMENT Right 01/15/2015   Procedure: CYSTOSCOPY, RIGHT URETEROSCOPY/ RETROGRADE PYELOGRAM,HOLMIUM LASER/ LITHOTRIPSY, STENT PLACEMENT, URETERAL BALLOON DIALATION;  Surgeon: Kathie Rhodes, MD;  Location: [facility];  Service: Urology;  Laterality: Right;  . INGUINAL HERNIA REPAIR Bilateral 08/05/2019   Procedure: OPEN BILATERAL INGUINAL HERNIA REPAIRS WITH MESH;  Surgeon: Donnie Mesa, MD;  Location: [facility];  Service: General;  Laterality: Bilateral;  . INSERTION OF MESH Bilateral 08/05/2019   Procedure: INSERTION OF MESH;  Surgeon: Donnie Mesa, MD;  Location: [facility];  Service: General;  Laterality: Bilateral;  . SKIN GRAFT     left hand  . VENTRICULOPERITONEAL SHUNT Right 09/04/2016   Procedure: SHUNT INSERTION VENTRICULAR-PERITONEAL;  Surgeon: Newman Pies, MD;  Location: [facility];  Service: Neurosurgery;  Laterality: Right;  . WISDOM TOOTH EXTRACTION      SOCIAL HISTORY: Social History   Socioeconomic History  . Marital status: Divorced    Spouse name: Not on file  . Number of children: 2  . Years of education: Not on file  . Highest education level: Not on file  Occupational History    Employer: [id number]    Comment: Radio broadcast assistant  Tobacco Use  . Smoking status: Current Every Day Smoker    Packs/day: 1.00    Years: 28.00    Pack years: 28.00    Types: Cigarettes  . Smokeless tobacco: Never Used  Vaping Use  .  Vaping Use: Some days  Substance and Sexual Activity  . Alcohol use: Yes    Alcohol/week: 2.0 - 3.0 standard drinks    Types: 2 - 3 Cans of beer per week    Comment: daily alcohol use/drinks beer, wine, and liquor  . Drug use: Yes    Types: Marijuana    Comment: 2x per week  . Sexual activity: Not Currently  Other Topics Concern  . Not on file  Social History Narrative  . Not on file   Social Determinants of Health   Financial Resource Strain: Not on file   Food Insecurity: Not on file  Transportation Needs: Not on file  Physical Activity: Not on file  Stress: Not on file  Social Connections: Not on file  Intimate Partner Violence: Not on file    FAMILY HISTORY: Family History  Problem Relation Age of Onset  . Hyperlipidemia Father   . Colon cancer Neg Hx   . Esophageal cancer Neg Hx   . Stomach cancer Neg Hx   . Pancreatic cancer Neg Hx     ALLERGIES:  is allergic to codeine and lisinopril.  MEDICATIONS:  Current Outpatient Medications  Medication Sig Dispense Refill  . amLODipine (NORVASC) 10 MG tablet TAKE 0.5 TABLETS (5 MG TOTAL) BY MOUTH 2 (TWO) TIMES DAILY. 90 tablet 1  . aspirin EC 325 MG tablet Take 162 mg by mouth daily.     Marland Kitchen losartan (COZAAR) 25 MG tablet TAKE 1 TABLET BY MOUTH EVERY DAY 30 tablet 0  . omeprazole (PRILOSEC) 20 MG capsule Take 1 capsule (20 mg total) by mouth daily for 28 days. (Patient not taking: Reported on 03/08/2020) 30 capsule 0  . ondansetron (ZOFRAN ODT) 4 MG disintegrating tablet Take 1 tablet (4 mg total) by mouth every 8 (eight) hours as needed for nausea or vomiting. 20 tablet 0  . promethazine (PHENERGAN) 12.5 MG suppository Place 1 suppository (12.5 mg total) rectally every 8 (eight) hours as needed for nausea or vomiting. 12 each 2   No current facility-administered medications for this visit.    REVIEW OF SYSTEMS:   Constitutional: ( - ) fevers, ( - )  chills , ( - ) night sweats Eyes: ( - ) blurriness of vision, ( - ) double vision, ( - ) watery eyes Ears, nose, mouth, throat, and face: ( - ) mucositis, ( - ) sore throat Respiratory: ( - ) cough, ( - ) dyspnea, ( - ) wheezes Cardiovascular: ( - ) palpitation, ( - ) chest discomfort, ( - ) lower extremity swelling Gastrointestinal:  ( - ) nausea, ( - ) heartburn, ( - ) change in bowel habits Skin: ( - ) abnormal skin rashes Lymphatics: ( - ) new lymphadenopathy, ( - ) easy bruising Neurological: ( - ) numbness, ( - ) tingling, ( - )  new weaknesses Behavioral/Psych: ( - ) mood change, ( - ) new changes  All other systems were reviewed with the patient and are negative.  PHYSICAL EXAMINATION:  Vitals:   11/29/20 1443  BP: (!) 130/58  Pulse: 66  Resp: 18  Temp: 98.9 F (37.2 C)  SpO2: 99%   Filed Weights   11/29/20 1443  Weight: 185 lb 3.2 oz (84 kg)    GENERAL: well appearing middle aged Caucasian Luis Henderson. alert, no distress and comfortable SKIN: skin color, texture, turgor are normal, no rashes or significant lesions EYES: conjunctiva are pink and non-injected, sclera clear LUNGS: clear to auscultation and percussion with normal  breathing effort HEART: regular rate & rhythm and no murmurs and no lower extremity edema Musculoskeletal: no cyanosis of digits and no clubbing  PSYCH: alert & oriented x 3, fluent speech NEURO: no focal motor/sensory deficits  LABORATORY DATA:  I have reviewed the data as listed CBC Latest Ref Rng & Units 11/29/2020 09/05/2020 03/08/2020  WBC 4.0 - 10.5 K/uL 9.7 11.3(H) 12.4(H)  Hemoglobin 13.0 - 17.0 g/dL 17.3(H) 21.3(HH) 19.7(H)  Hematocrit 39.0 - 52.0 % 51.6 62.7(H) 60.5(H)  Platelets 150 - 400 K/uL 262 309 335    CMP Latest Ref Rng & Units 11/29/2020 09/05/2020 03/08/2020  Glucose 70 - 99 mg/dL 116(H) 170(H) 172(H)  BUN 8 - 23 mg/dL 18 17 13   Creatinine 0.61 - 1.24 mg/dL 0.96 1.32(H) 0.93  Sodium 135 - 145 mmol/L 139 137 137  Potassium 3.5 - 5.1 mmol/L 3.9 3.5 3.8  Chloride 98 - 111 mmol/L 110 97(L) 104  CO2 22 - 32 mmol/L 22 22 20(L)  Calcium 8.9 - 10.3 mg/dL 9.2 10.0 9.5  Total Protein 6.5 - 8.1 g/dL 6.9 7.0 7.1  Total Bilirubin 0.3 - 1.2 mg/dL 0.6 1.3(H) 0.9  Alkaline Phos 38 - 126 U/L 71 67 70  AST 15 - 41 U/L 23 36 21  ALT 0 - 44 U/L 18 31 22     RADIOGRAPHIC STUDIES: No results found.  ASSESSMENT & PLAN GRAYCEN SADLON 65 y.o. 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   At this  time our recommendation would be for discontinuation of smoking and continued monitoring of his hemoglobin level.  We will have the patient return to clinic in approximately 12 months time to reevaluate and assure that there has not been any change in his blood counts.  In the event the patient were to develop cytopenias, or worsening erythrocytosis please let our clinic know we will schedule him at an earlier time.  As for now the only therapy would recommend is daily aspirin for thromboprophylaxis.  # Polycythemia, Secondary  --today will recheck CBC, CMP --prior testing negative for JAK2, MPL, and CALR. BCR/ABL --no indication for phlebotomy or cytoreductive therapy as this is secondary polycythemia --continue to the use of ASA 81mg  daily for thromboprophylaxis/cardiac protection --patient underwent OSA evaluation with sleep study, found to be negative for sleep apnea --RTC in 12 months time to reassess.   No orders of the defined types were placed in this encounter.   All questions were answered. The patient knows to call the clinic with any problems, questions or concerns.  A total of more than 30 minutes were spent on this encounter and over half of that time was spent on counseling and coordination of care as outlined above.   Ledell Peoples, MD Department of Hematology/Oncology [facility] Phone: [phone] Pager: [phone] Email: [email or web address]  11/30/2020 12:42 PM

## 2020-12-13 ENCOUNTER — Encounter: Payer: Self-pay | Admitting: Neurology

## 2020-12-13 ENCOUNTER — Ambulatory Visit (INDEPENDENT_AMBULATORY_CARE_PROVIDER_SITE_OTHER): Payer: Medicare Other | Admitting: Neurology

## 2020-12-13 VITALS — BP 132/72 | HR 63 | Ht 71.0 in | Wt 184.0 lb

## 2020-12-13 DIAGNOSIS — G911 Obstructive hydrocephalus: Secondary | ICD-10-CM | POA: Diagnosis not present

## 2020-12-13 DIAGNOSIS — G930 Cerebral cysts: Secondary | ICD-10-CM | POA: Diagnosis not present

## 2020-12-13 NOTE — Patient Instructions (Signed)
Normal-Pressure Hydrocephalus Normal pressure hydrocephalus (NPH) is a buildup of fluid (cerebrospinal fluid, CSF) inside the brain. This may or may not increase the pressure inside the brain (intracranial pressure). CSF is normally present in the brain, but when too much CSF increases pressure on the brain, it can affect brain function. NPH usually occurs in people who are older than age 66. Many symptoms of NPH are also associated with aging or certain medical conditions. It is important to pay attention to changes in behavior and mental function. What are the causes? This condition may be caused by anything that blocks the flow of CSF, such as:  Head injury.  Infections.  Brain surgery.  Bleeding from a blood vessel in the brain.  Tumors or cancer. In many cases, the cause of this condition is not known. What are the signs or symptoms? Symptoms of this condition may include:  Difficulty walking, such as: ? Shuffling feet when walking. ? Unsteadiness. ? Problems when beginning to walk. ? Feet feeling as though they are stuck or "frozen" to the floor.  Problems with bowel and bladder control.  Memory problems, such as: ? Forgetfulness. ? Lack of concentration. ? Mood problems. ? Confusion. How is this diagnosed? This condition is diagnosed based on:  Your medical history.  A physical exam. This can reveal walking (gait) changes or twitching or sudden muscle tightening (spasms) in the legs.  Other tests to confirm the diagnosis and identify the best options for treatment. These tests include: ? Removal and examination of a small amount of CSF (lumbar puncture). ? CT scan of your head. ? MRI scan of your head. How is this treated? This condition may be treated with surgery in which a narrow tube (ventriculoperitoneal shunt, or VP shunt) is placed in the brain to drain excess CSF. After a shunt is placed, you will be monitored to make sure CSF is draining properly. Depending  on your age and overall health condition, you may not be a candidate for surgery. If your symptoms are mild, your condition may be monitored on a regular basis before a decision is made about whether a shunt is needed.   Follow these instructions at home: Medicines  Take over-the-counter and prescription medicines only as told by your health care provider.  Avoid taking medicines that can affect thinking, such as pain or sleeping medicines. Lifestyle  Do not use any products that contain nicotine or tobacco, such as cigarettes and e-cigarettes. If you need help quitting, ask your health care provider.  Drink enough fluid to keep your urine clear or pale yellow. If you have a shunt:  Do not wear tight-fitting hats or headgear.  Before having an MRI or abdominal surgery, tell your health care provider that you have a shunt.  Watch for signs of infection around the shunt, such as: ? Fever. ? Redness, pain, or swelling of the skin along the shunt path. ? Headache or stiff neck. ? Nausea or vomiting. General instructions  Work with your health care provider to determine what you need help with and what your safety needs are.  Ask your health care provider when you can resume your normal activities.  Keep all follow-up visits as told by your health care provider. This is important. Contact a health care provider if:  You have any new symptoms.  Your symptoms get worse.  Your symptoms do not improve after surgery. Get help right away if:  You have: ? A fever or other signs of infection. ?  New or worsening confusion. ? New or worsening sleepiness. ? A hard time staying awake. ? A headache that is getting worse.  You start to twitch or shake (seizure).  You lose coordination or balance.  Your or your family members become concerned for your safety. Summary  Normal pressure hydrocephalus (NPH) is a buildup of fluid (cerebrospinal fluid, CSF) inside the brain. Too much CSF  can put pressure on the brain and affect its function.  Anything that blocks the flow of CSF can cause this condition. In some cases, the cause of this condition is not known.  Depending on your age, overall health condition, and diagnostic tests, you may be a good candidate for a shunt to drain excess CSF. This information is not intended to replace advice given to you by your health care provider. Make sure you discuss any questions you have with your health care provider. Document Revised: 02/19/2018 Document Reviewed: 11/02/2016 Elsevier Patient Education  2021 [employer].

## 2020-12-13 NOTE — Progress Notes (Signed)
Provider:  Larey Seat, M D  Referring Provider: Ladell Pier, MD Primary Care Physician:  Ladell Pier, MD  Chief Complaint  Patient presents with  . New Patient (Initial Visit)    pt alone here ,rm 10. pt states that in 2017 had a VP shunt procedure  in hospital as emergency Procedure by Dr Arnoldo Morale. New  PCP wanted him to follow up and make sure things are working properly. pt denies any signs of     HPI:  Luis Henderson is a 65 y.o. male seen here upon a referral  from Dr. Karle Plumber, MD for a follow up on hydrocephalus/ VP shunt.  [name] reports that in 2017- he gradually lost control of his motor function especially in the lower extremities, and also control of bladder and bowel.  This was related to the development of hydrocephalus.  Dr. Earle Gell was his neurosurgeon who placed the VP shunt emergently.  After the procedure he was arrested and jailed in [place] for 6 month until all charges were reportedly dismissed, he reports that when he returned after his release he faced costs for the upkeeping of his cats, several thousand dollars, and he had lost his home, his wife had left him.  He was unemployed and uninsured- and no follow up. He now has insurance. He developed emesis after a large meals, and he suspects he has some obstruction. He had his gallbladder removed 3 weeks ago. 05-01-2019. Readmitted from 05-02-21-2020 from emesis.  Last visit with NP [place] on 12-10-2019. His hematological changes have recovered.  Has healed well after gallbladder surgery. He had a double hernia surgery, recovered well after he initially had scrotum swelling. He still smokes. He works at [employer]. He walks a lot, reports no SOB.  No other medical interval history- RV should be every year from now on.     Neurological question is the follow up for shunt VP function.     Review of Systems: Out of a complete 14 system review, the  patient complains of only the following symptoms, and all other reviewed systems are negative. He was also diagnosed with a renal cyst and stones, he described dizziness, polycythemia. Has HTN, bilateral inguinal hernias.   nausea, inflammation,no longer  belly pain, no SOB.   Social History   Socioeconomic History  . Marital status: Divorced    Spouse name: Not on file  . Number of children: 2  . Years of education: Not on file  . Highest education level: Not on file  Occupational History    Employer: [email or web address]    Comment: Radio broadcast assistant  Social Needs  . Financial resource strain: Not on file  . Food insecurity    Worry: Not on file    Inability: Not on file  . Transportation needs    Medical: Not on file    Non-medical: Not on file  Tobacco Use  . Smoking status: Current Every Day Smoker    Packs/day: 1.00    Years: 28.00    Pack years: 28.00    Types: Cigarettes  . Smokeless tobacco: Never Used  Substance and Sexual Activity  . Alcohol use: Yes    Alcohol/week: 5.0 - 6.0 standard drinks    Types: 5 - 6 Cans of beer per week    Comment: daily alcohol/drinks beer, wine, and liquor  . Drug use: Yes    Types: Marijuana  . Sexual activity: Not on file  Lifestyle  . Physical activity    Days per week: Not on file    Minutes per session: Not on file  . Stress: Not on file  Relationships  . Social Herbalist on phone:     Gets together:     Attends religious service:     Active member of club or organization:     Attends meetings of clubs or organizations:     Relationship status:   . Intimate partner violence    Fear of current or ex partner:     Emotionally abused:     Physically abused:     Forced sexual activity:   Other Topics Concern  . Not on file   HOMELESS , lives in his [place] car with 5 cats    Social History Narrative  . Not on file  Wife left in 2014 after 18 years of marriage.  Radio broadcast assistant at [employer].     Family History   Problem Relation Age of Onset  . Hyperlipidemia Father   . Colon cancer Neg Hx   . Esophageal cancer Neg Hx   . Stomach cancer Neg Hx   . Pancreatic cancer Neg Hx     Past Medical History:  Diagnosis Date  . Anxiety   . Depression   . Diverticulitis 04/2019  . History of kidney stones   . History of prediabetes   . Hyperlipidemia   . Hypertension   . Polycythemia     Past Surgical History:  Procedure Laterality Date  . CHOLECYSTECTOMY N/A 05/06/2019   Procedure: LAPAROSCOPIC CHOLECYSTECTOMY WITH INTRAOPERATIVE CHOLANGIOGRAM;  Surgeon: Donnie Mesa, MD;  Location: [facility];  Service: General;  Laterality: N/A;  . COLONOSCOPY    . CYSTOSCOPY WITH RETROGRADE PYELOGRAM, URETEROSCOPY AND STENT PLACEMENT Right 01/15/2015   Procedure: CYSTOSCOPY, RIGHT URETEROSCOPY/ RETROGRADE PYELOGRAM,HOLMIUM LASER/ LITHOTRIPSY, STENT PLACEMENT, URETERAL BALLOON DIALATION;  Surgeon: Kathie Rhodes, MD;  Location: [facility];  Service: Urology;  Laterality: Right;  . INGUINAL HERNIA REPAIR Bilateral 08/05/2019   Procedure: OPEN BILATERAL INGUINAL HERNIA REPAIRS WITH MESH;  Surgeon: Donnie Mesa, MD;  Location: [facility];  Service: General;  Laterality: Bilateral;  . INSERTION OF MESH Bilateral 08/05/2019   Procedure: INSERTION OF MESH;  Surgeon: Donnie Mesa, MD;  Location: [facility];  Service: General;  Laterality: Bilateral;  . SKIN GRAFT     left hand  . VENTRICULOPERITONEAL SHUNT Right 09/04/2016   Procedure: SHUNT INSERTION VENTRICULAR-PERITONEAL;  Surgeon: Newman Pies, MD;  Location: [facility];  Service: Neurosurgery;  Laterality: Right;  . WISDOM TOOTH EXTRACTION      Current Outpatient Medications  Medication Sig Dispense Refill  . amLODipine (NORVASC) 10 MG tablet TAKE 0.5 TABLETS (5 MG TOTAL) BY MOUTH 2 (TWO) TIMES DAILY. 90 tablet 1  . aspirin EC 325 MG tablet Take 162 mg by mouth daily.     Marland Kitchen losartan (COZAAR) 25 MG tablet TAKE 1 TABLET BY MOUTH EVERY DAY 30  tablet 0  . ondansetron (ZOFRAN ODT) 4 MG disintegrating tablet Take 1 tablet (4 mg total) by mouth every 8 (eight) hours as needed for nausea or vomiting. 20 tablet 0  . promethazine (PHENERGAN) 12.5 MG suppository Place 1 suppository (12.5 mg total) rectally every 8 (eight) hours as needed for nausea or vomiting. 12 each 2  . omeprazole (PRILOSEC) 20 MG capsule Take 1 capsule (20 mg total) by mouth daily for 28 days. (Patient not taking: Reported on 03/08/2020) 30 capsule 0  No current facility-administered medications for this visit.      Allergies as of 12/13/2020 - Review Complete 12/13/2020  Allergen Reaction Noted  . Codeine Itching 02/05/2012  . 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  Very enlarged ventricals.  Vitals: BP 132/72   Pulse 63   Ht 5\' 11"  (1.803 m)   Wt 184 lb (83.5 kg)   BMI 25.66 kg/m  Last Weight:  Wt Readings from Last 1 Encounters:  12/13/20 184 lb (83.5 kg)   Last Height:   Ht Readings from Last 1 Encounters:  12/13/20 5\' 11"  (1.803 m)    Physical exam:  General: The patient is awake, alert and appears not in acute distress. The patient is  groomed. Head: Normocephalic, atraumatic. Neck is supple. Mallampati 3 poor dentition-  , neck circumference: 15" Cardiovascular:  Regular rate and rhythm - without distended neck veins. Respiratory: Lungs are clear to auscultation. Skin:  Without evidence of edema, or rash Trunk: BMI is 27. The  patient  has normal posture.  Neurologic exam : The patient is awake and alert, oriented to place and time. Memory subjective described as intact. There is a normal attention span & concentration ability.  Speech is fluent without pressure- no dysarthria, dysphonia or aphasia.  Mood and affect are appropriate.  Cranial nerves: Pupils are equal and briskly reactive to light. Funduscopic exam without evidence of pallor or  edema.  Extraocular movements  in vertical and horizontal planes intact and without nystagmus.  Visual fields by finger perimetry are intact. Hearing to tuning fork is intact. Facial sensation intact to fine touch.  Facial motor strength is symmetric and tongue and uvula move midline. Tongue protrusion into either cheek is normal. Shoulder shrug is normal.   Motor exam:  Normal tone ,muscle bulk and symmetric strength in all extremities.  Sensory:  Fine touch  and vibration were felt normal inhands and feet. .  Coordination: Rapid alternating movements in the fingers/hands were normal. Penmanship is unchanged.  Finger-to-nose maneuver  normal without evidence of ataxia, dysmetria or tremor.  Gait and station: Patient walks without assistive device and is able unassisted to climb up to the exam table. Strength within normal limits. Stance is stable and normal based.  Tandem gait is unfragmented. He turned with 2.5 steps - no dizziness.  Romberg testing is negative   Deep tendon reflexes: in the upper and lower extremities are symmetric and intact. Babinski deferred.   Assessment:  After physical and neurologic examination, review of laboratory studies, imaging, neurophysiology testing and pre-existing records, 22 minutes assessment with gait and eye examination, interview- is that of :   No evidence of returning intracranial pressure elevation  - normal EOM, normal balance and no return of incontinence.   Plan:  Treatment plan and additional workup :   We do not need to get another  CT image of the head to make sure the VP shunt works well, as it clinically apears.  Only if symptoms of hydrocephalus should re- occur.   RV every year or prn.     Asencion Partridge Marguis Mathieson MD 12/13/2020

## 2020-12-16 DIAGNOSIS — G4700 Insomnia, unspecified: Secondary | ICD-10-CM | POA: Diagnosis not present

## 2020-12-23 ENCOUNTER — Other Ambulatory Visit: Payer: Self-pay | Admitting: Internal Medicine

## 2020-12-23 DIAGNOSIS — F17218 Nicotine dependence, cigarettes, with other nicotine-induced disorders: Secondary | ICD-10-CM | POA: Diagnosis not present

## 2020-12-23 DIAGNOSIS — I739 Peripheral vascular disease, unspecified: Secondary | ICD-10-CM | POA: Diagnosis not present

## 2020-12-23 DIAGNOSIS — Z0000 Encounter for general adult medical examination without abnormal findings: Secondary | ICD-10-CM | POA: Diagnosis not present

## 2020-12-23 DIAGNOSIS — E7800 Pure hypercholesterolemia, unspecified: Secondary | ICD-10-CM | POA: Diagnosis not present

## 2020-12-23 DIAGNOSIS — Z23 Encounter for immunization: Secondary | ICD-10-CM | POA: Diagnosis not present

## 2021-01-07 ENCOUNTER — Other Ambulatory Visit: Payer: Self-pay

## 2021-01-07 ENCOUNTER — Ambulatory Visit
Admission: RE | Admit: 2021-01-07 | Discharge: 2021-01-07 | Disposition: A | Discharge status: Home / Self Care | Payer: Medicare Other | Source: Ambulatory Visit | Attending: Internal Medicine | Admitting: Internal Medicine

## 2021-01-07 DIAGNOSIS — F17210 Nicotine dependence, cigarettes, uncomplicated: Secondary | ICD-10-CM | POA: Diagnosis not present

## 2021-01-07 DIAGNOSIS — F17218 Nicotine dependence, cigarettes, with other nicotine-induced disorders: Secondary | ICD-10-CM

## 2021-02-20 NOTE — Progress Notes (Signed)
Cardiology Office Note:    Date:  02/23/2021   ID:  [name], DOB [birth year], MRN [id number]  PCP:  Seward Carol, MD   [facility] Providers Cardiologist:  None {    Referring MD: Scheryl Marten, PA     History of Present Illness:    Luis Henderson is a 65 y.o. male with a hx of HTN, HLD, anxiety, alcohol abuse, tobacco use, hydrocephalus s/p VP shunt, multivessel coronary calcification on CT chest and depression who was referred by Fredda Hammed, PA for further evaluation of coronary calicification.   The patient states that he had a CT scan a few weeks ago for lung cancer screening which revealed multivessel coronary calcification, prompting his referral here. He reports that he overall feels well. He is very active and walks 4-54miles per day without issues. Denies chest pain, dyspnea on exertion, palpitations, lightheadedness, orthopnea, or PND. Blood pressure is mainly in the 130s at home. Continues to smoke cigarettes but is working on trying to quit.   Past Medical History:  Diagnosis Date  . Anxiety   . Depression   . Diverticulitis 04/2019  . History of kidney stones   . History of prediabetes   . Hyperlipidemia   . Hypertension   . Polycythemia     Past Surgical History:  Procedure Laterality Date  . CHOLECYSTECTOMY N/A 05/06/2019   Procedure: LAPAROSCOPIC CHOLECYSTECTOMY WITH INTRAOPERATIVE CHOLANGIOGRAM;  Surgeon: Donnie Mesa, MD;  Location: [facility];  Service: General;  Laterality: N/A;  . COLONOSCOPY    . CYSTOSCOPY WITH RETROGRADE PYELOGRAM, URETEROSCOPY AND STENT PLACEMENT Right 01/15/2015   Procedure: CYSTOSCOPY, RIGHT URETEROSCOPY/ RETROGRADE PYELOGRAM,HOLMIUM LASER/ LITHOTRIPSY, STENT PLACEMENT, URETERAL BALLOON DIALATION;  Surgeon: Kathie Rhodes, MD;  [facility];  Service: Urology;  Laterality: Right;  . INGUINAL HERNIA REPAIR Bilateral 08/05/2019   Procedure: OPEN BILATERAL INGUINAL HERNIA REPAIRS WITH MESH;  Surgeon: Donnie Mesa, MD;  Location: [facility];  Service: General;  Laterality: Bilateral;  . INSERTION OF MESH Bilateral 08/05/2019   Procedure: INSERTION OF MESH;  Surgeon: Donnie Mesa, MD;  Location: [facility];  Service: General;  Laterality: Bilateral;  . SKIN GRAFT     left hand  . VENTRICULOPERITONEAL SHUNT Right 09/04/2016   Procedure: SHUNT INSERTION VENTRICULAR-PERITONEAL;  Surgeon: Newman Pies, MD;  Location: [facility];  Service: Neurosurgery;  Laterality: Right;  . WISDOM TOOTH EXTRACTION      Current Medications: Current Meds  Medication Sig  . amLODipine (NORVASC) 10 MG tablet Take 10 mg by mouth daily.  Marland Kitchen aspirin EC 325 MG tablet Take 162 mg by mouth daily.   Marland Kitchen losartan (COZAAR) 50 MG tablet Take 1 tablet (50 mg total) by mouth daily.  . ondansetron (ZOFRAN ODT) 4 MG disintegrating tablet Take 1 tablet (4 mg total) by mouth every 8 (eight) hours as needed for nausea or vomiting.  . rosuvastatin (CRESTOR) 10 MG tablet Take 1 tablet (10 mg total) by mouth daily.  . [DISCONTINUED] losartan (COZAAR) 25 MG tablet TAKE 1 TABLET BY MOUTH EVERY DAY     Allergies:   Codeine and Lisinopril   Social History   Socioeconomic History  . Marital status: Divorced    Spouse name: Not on file  . Number of children: 2  . Years of education: Not on file  . Highest education level: Not on file  Occupational History    Employer: [id number]    Comment: Radio broadcast assistant  Tobacco Use  . Smoking status:  Current Every Day Smoker    Packs/day: 1.00    Years: 28.00    Pack years: 28.00    Types: Cigarettes  . Smokeless tobacco: Never Used  Vaping Use  . Vaping Use: Some days  Substance and Sexual Activity  . Alcohol use: Yes    Alcohol/week: 2.0 - 3.0 standard drinks    Types: 2 - 3 Cans of beer per week    Comment: daily alcohol use/drinks beer, wine, and liquor  . Drug use: Yes    Types: Marijuana    Comment: 2x per week  . Sexual activity: Not Currently   Other Topics Concern  . Not on file  Social History Narrative  . Not on file   Social Determinants of Health   Financial Resource Strain: Not on file  Food Insecurity: Not on file  Transportation Needs: Not on file  Physical Activity: Not on file  Stress: Not on file  Social Connections: Not on file     Family History: The patient's family history includes Hyperlipidemia in his father. There is no history of Colon cancer, Esophageal cancer, Stomach cancer, or Pancreatic cancer.  ROS:   Please see the history of present illness.    Review of Systems  Constitutional: Negative for chills and fever.  HENT: Negative for hearing loss.   Eyes: Negative for blurred vision and redness.  Respiratory: Negative for shortness of breath.   Cardiovascular: Negative for chest pain, palpitations, orthopnea, claudication, leg swelling and PND.  Gastrointestinal: Positive for nausea. Negative for melena.  Genitourinary: Negative for flank pain.  Musculoskeletal: Negative for falls.  Neurological: Negative for dizziness and loss of consciousness.  Endo/Heme/Allergies: Negative for polydipsia.  Psychiatric/Behavioral: Negative for substance abuse.    EKGs/Labs/Other Studies Reviewed:    The following studies were reviewed today: CT chest 01/07/21: IMPRESSION: 1. Lung-RADS 2S, benign appearance or behavior. Continue annual screening with low-dose chest CT without contrast in 12 months. 2. The "S" modifier above refers to potentially clinically significant non lung cancer related findings. Specifically, there is aortic atherosclerosis, in addition to left main and 3 vessel coronary artery disease. Please note that although the presence of coronary artery calcium documents the presence of coronary artery disease, the severity of this disease and any potential stenosis cannot be assessed on this non-gated CT examination. Assessment for potential risk factor modification, dietary therapy or  pharmacologic therapy may be warranted, if clinically indicated. 3. Mild diffuse bronchial wall thickening with very mild centrilobular and paraseptal emphysema; imaging findings suggestive of underlying COPD.  Aortic Atherosclerosis (ICD10-I70.0) and Emphysema (ICD10-J43.9).  EKG:  EKG is  ordered today.  The ekg ordered today demonstrates NSR with HR 77  Recent Labs: 11/29/2020: ALT 18; BUN 18; Creatinine 0.96; Hemoglobin 17.3; Platelet Count 262; Potassium 3.9; Sodium 139  Recent Lipid Panel    Component Value Date/Time   CHOL 178 04/14/2016 1002   TRIG 140 04/14/2016 1002   HDL 47 04/14/2016 1002   CHOLHDL 3.8 04/14/2016 1002   VLDL 28 04/14/2016 1002   LDLCALC 103 04/14/2016 1002       Physical Exam:    VS:  BP 136/74   Pulse 77   Ht 5\' 11"  (1.803 m)   Wt 179 lb 9.6 oz (81.5 kg)   SpO2 97%   BMI 25.05 kg/m     Wt Readings from Last 3 Encounters:  02/23/21 179 lb 9.6 oz (81.5 kg)  12/13/20 184 lb (83.5 kg)  11/29/20 185 lb 3.2 oz (84  kg)     GEN:  Well nourished, well developed in no acute distress HEENT: Normal NECK: No JVD; No carotid bruits CARDIAC: RRR, no murmurs, rubs, gallops RESPIRATORY:  Diminished but clear ABDOMEN: Soft, non-tender, non-distended MUSCULOSKELETAL:  No edema; No deformity  SKIN: Warm and dry NEUROLOGIC:  Alert and oriented x 3 PSYCHIATRIC:  Normal affect   ASSESSMENT:    1. Coronary artery disease involving native coronary artery of native heart without angina pectoris   2. Primary hypertension   3. Pure hypercholesterolemia   4. Medication management   5. Tobacco abuse    PLAN:    In order of problems listed above:  #Multivessel CAD with calcification on CT chest: Patient noted to have multivessel coronary calcification on CT chest that was obtained for lung cancer screening. Very active with no anginal symptoms or HF symptoms. Will proceed with aggressive medical management.  -Continue ASA 81mg  daily (patient buys  325mg  and then cuts them as enteric coated ASA is more expensive) -Start crestor 10mg  daily -Smoking cessation encouraged -Blood pressure control as below -Healthy diet and lifestyle modifications as detailed below  #HTN: Elevated today. Mainly 130-140s at home. -Continue amlodipine 10mg  daily -Increase losartan to 50mg  daily -BMET at time of lipids  #HLD: LDL 144 in 10/2020. -Start crestor 10mg  daily -Check lipids in 6-8 weeks -Goal <70  #Tobacco abuse: -Cessation counseling -Monitoring with annual CT chest  Exercise recommendations: Goal of exercising for at least 30 minutes a day, at least 5 times per week.  Please exercise to a moderate exertion.  This means that while exercising it is difficult to speak in full sentences, however you are not so short of breath that you feel you must stop, and not so comfortable that you can carry on a full conversation.  Exertion level should be approximately a 5/10, if 10 is the most exertion you can perform.  Diet recommendations: Recommend a heart healthy diet such as the Mediterranean diet.  This diet consists of plant based foods, healthy fats, lean meats, olive oil.  It suggests limiting the intake of simple carbohydrates such as white breads, pastries, and pastas.  It also limits the amount of red meat, wine, and dairy products such as cheese that one should consume on a daily basis.     Medication Adjustments/Labs and Tests Ordered: Current medicines are reviewed at length with the patient today.  Concerns regarding medicines are outlined above.  Orders Placed This Encounter  Procedures  . Basic metabolic panel  . Lipid Profile  . EKG 12-Lead   Meds ordered this encounter  Medications  . losartan (COZAAR) 50 MG tablet    Sig: Take 1 tablet (50 mg total) by mouth daily.    Dispense:  90 tablet    Refill:  2    Dose increase  . rosuvastatin (CRESTOR) 10 MG tablet    Sig: Take 1 tablet (10 mg total) by mouth daily.     Dispense:  90 tablet    Refill:  2    Patient Instructions  Medication Instructions:   INCREASE YOUR LOSARTAN TO 50 MG BY MOUTH DAILY  START TAKING ROSUVASTATIN (CRESTOR) 10 MG BY MOUTH DAILY  *If you need a refill on your cardiac medications before your next appointment, please call your pharmacy*   Lab Work:  IN 6 WEEKS HERE IN THE OFFICE--WE WILL CHECK BMET AND LIPIDS AT THAT TIME--PLEASE COME FASTING TO THIS LAB APPOINTMENT  If you have labs (blood work) drawn today  and your tests are completely normal, you will receive your results only by: Marland Kitchen MyChart Message (if you have MyChart) OR . A paper copy in the mail If you have any lab test that is abnormal or we need to change your treatment, we will call you to review the results.   Follow-Up: At [facility], you and your health needs are our priority.  As part of our continuing mission to provide you with exceptional heart care, we have created designated Provider Care Teams.  These Care Teams include your primary Cardiologist (physician) and Advanced Practice Providers (APPs -  Physician Assistants and Nurse Practitioners) who all work together to provide you with the care you need, when you need it.  We recommend signing up for the patient portal called "MyChart".  Sign up information is provided on this After Visit Summary.  MyChart is used to connect with patients for Virtual Visits (Telemedicine).  Patients are able to view lab/test results, encounter notes, upcoming appointments, etc.  Non-urgent messages can be sent to your provider as well.   To learn more about what you can do with MyChart, go to [email or web address].    Your next appointment:   6 month(s)  The format for your next appointment:   In Person  Provider:   You will see one of the following Advanced Practice Providers on your designated Care Team:    Richardson Dopp, PA-C  [place], [place]  Cecilie Kicks NP  DAYNA DUNN PA-C  [facility]  PA-C  JILL MCDANIEL NP           Signed, Freada Bergeron, MD  02/23/2021 12:32 PM    [place]

## 2021-02-23 ENCOUNTER — Encounter: Payer: Self-pay | Admitting: Cardiology

## 2021-02-23 ENCOUNTER — Other Ambulatory Visit: Payer: Self-pay

## 2021-02-23 ENCOUNTER — Ambulatory Visit (INDEPENDENT_AMBULATORY_CARE_PROVIDER_SITE_OTHER): Payer: Medicare Other | Admitting: Cardiology

## 2021-02-23 VITALS — BP 136/74 | HR 77 | Ht 71.0 in | Wt 179.6 lb

## 2021-02-23 DIAGNOSIS — I2510 Atherosclerotic heart disease of native coronary artery without angina pectoris: Secondary | ICD-10-CM | POA: Diagnosis not present

## 2021-02-23 DIAGNOSIS — Z79899 Other long term (current) drug therapy: Secondary | ICD-10-CM | POA: Diagnosis not present

## 2021-02-23 DIAGNOSIS — E7800 Pure hypercholesterolemia, unspecified: Secondary | ICD-10-CM

## 2021-02-23 DIAGNOSIS — Z720 Tobacco use: Secondary | ICD-10-CM

## 2021-02-23 DIAGNOSIS — I10 Essential (primary) hypertension: Secondary | ICD-10-CM

## 2021-02-23 MED ORDER — LOSARTAN POTASSIUM 50 MG PO TABS
50.0000 mg | ORAL_TABLET | Freq: Every day | ORAL | 2 refills | Status: DC
Start: 2021-02-23 — End: 2022-01-30

## 2021-02-23 MED ORDER — ROSUVASTATIN CALCIUM 10 MG PO TABS: 10.0000 mg | ORAL_TABLET | Freq: Every day | ORAL | 2 refills | Status: DC

## 2021-02-23 NOTE — Patient Instructions (Signed)
Medication Instructions:   INCREASE YOUR LOSARTAN TO 50 MG BY MOUTH DAILY  START TAKING ROSUVASTATIN (CRESTOR) 10 MG BY MOUTH DAILY  *If you need a refill on your cardiac medications before your next appointment, please call your pharmacy*   Lab Work:  IN 6 [place] OFFICE--WE WILL CHECK BMET AND LIPIDS AT THAT TIME--PLEASE COME FASTING TO THIS LAB APPOINTMENT  If you have labs (blood work) drawn today and your tests are completely normal, you will receive your results only by: Marland Kitchen MyChart Message (if you have MyChart) OR . A paper copy in the mail If you have any lab test that is abnormal or we need to change your treatment, we will call you to review the results.   Follow-Up: At [facility], you and your health needs are our priority.  As part of our continuing mission to provide you with exceptional heart care, we have created designated Provider Care Teams.  These Care Teams include your primary Cardiologist (physician) and Advanced Practice Providers (APPs -  Physician Assistants and Nurse Practitioners) who all work together to provide you with the care you need, when you need it.  We recommend signing up for the patient portal called "MyChart".  Sign up information is provided on this After Visit Summary.  MyChart is used to connect with patients for Virtual Visits (Telemedicine).  Patients are able to view lab/test results, encounter notes, upcoming appointments, etc.  Non-urgent messages can be sent to your provider as well.   To learn more about what you can do with MyChart, go to [email or web address].    Your next appointment:   6 month(s)  The format for your next appointment:   In Person  Provider:   You will see one of the following Advanced Practice Providers on your designated Care Team:    Richardson Dopp, PA-C  [place], PA-C  Cecilie Kicks NP  DAYNA DUNN PA-C  MICHELE LENZE PA-C  JILL [facility] NP

## 2021-04-06 ENCOUNTER — Other Ambulatory Visit: Payer: Self-pay

## 2021-04-06 ENCOUNTER — Other Ambulatory Visit: Payer: Medicare Other | Admitting: *Deleted

## 2021-04-06 DIAGNOSIS — I10 Essential (primary) hypertension: Secondary | ICD-10-CM

## 2021-04-06 DIAGNOSIS — Z79899 Other long term (current) drug therapy: Secondary | ICD-10-CM

## 2021-04-06 DIAGNOSIS — E7800 Pure hypercholesterolemia, unspecified: Secondary | ICD-10-CM

## 2021-04-06 LAB — LIPID PANEL
Chol/HDL Ratio: 2.5 ratio (ref 0.0–5.0)
Cholesterol, Total: 163 mg/dL (ref 100–199)
HDL: 66 mg/dL (ref >39)
LDL Chol Calc (NIH): 83 mg/dL (ref 0–99)
Triglycerides: 71 mg/dL (ref 0–149)
VLDL Cholesterol Cal: 14 mg/dL (ref 5–40)

## 2021-04-06 LAB — BASIC METABOLIC PANEL
BUN/Creatinine Ratio: 18 (ref 10–24)
BUN: 16 mg/dL (ref 8–27)
CO2: 20 mmol/L (ref 20–29)
Calcium: 9.5 mg/dL (ref 8.6–10.2)
Chloride: 102 mmol/L (ref 96–106)
Creatinine, Ser: 0.91 mg/dL (ref 0.76–1.27)
Glucose: 106 mg/dL — ABNORMAL HIGH (ref 65–99)
Potassium: 4.2 mmol/L (ref 3.5–5.2)
Sodium: 141 mmol/L (ref 134–144)
eGFR: 94 mL/min/{1.73_m2} (ref >59)

## 2021-05-26 DIAGNOSIS — I2510 Atherosclerotic heart disease of native coronary artery without angina pectoris: Secondary | ICD-10-CM | POA: Diagnosis not present

## 2021-05-26 DIAGNOSIS — F17218 Nicotine dependence, cigarettes, with other nicotine-induced disorders: Secondary | ICD-10-CM | POA: Diagnosis not present

## 2021-05-26 DIAGNOSIS — J439 Emphysema, unspecified: Secondary | ICD-10-CM | POA: Diagnosis not present

## 2021-06-23 DIAGNOSIS — R7309 Other abnormal glucose: Secondary | ICD-10-CM | POA: Diagnosis not present

## 2021-06-23 DIAGNOSIS — I10 Essential (primary) hypertension: Secondary | ICD-10-CM | POA: Diagnosis not present

## 2021-06-23 DIAGNOSIS — Z03818 Encounter for observation for suspected exposure to other biological agents ruled out: Secondary | ICD-10-CM | POA: Diagnosis not present

## 2021-06-23 DIAGNOSIS — F17218 Nicotine dependence, cigarettes, with other nicotine-induced disorders: Secondary | ICD-10-CM | POA: Diagnosis not present

## 2021-06-23 DIAGNOSIS — J439 Emphysema, unspecified: Secondary | ICD-10-CM | POA: Diagnosis not present

## 2021-06-23 DIAGNOSIS — R059 Cough, unspecified: Secondary | ICD-10-CM | POA: Diagnosis not present

## 2021-06-23 DIAGNOSIS — E7800 Pure hypercholesterolemia, unspecified: Secondary | ICD-10-CM | POA: Diagnosis not present

## 2021-07-27 ENCOUNTER — Ambulatory Visit
Admission: RE | Admit: 2021-07-27 | Discharge: 2021-07-27 | Disposition: A | Discharge status: Home / Self Care | Payer: Medicare Other | Source: Ambulatory Visit | Attending: Internal Medicine | Admitting: Internal Medicine

## 2021-07-27 ENCOUNTER — Other Ambulatory Visit: Payer: Self-pay | Admitting: Internal Medicine

## 2021-07-27 ENCOUNTER — Telehealth: Payer: Self-pay | Admitting: Neurology

## 2021-07-27 DIAGNOSIS — J439 Emphysema, unspecified: Secondary | ICD-10-CM | POA: Diagnosis not present

## 2021-07-27 DIAGNOSIS — R0781 Pleurodynia: Secondary | ICD-10-CM

## 2021-07-27 DIAGNOSIS — W19XXXA Unspecified fall, initial encounter: Secondary | ICD-10-CM | POA: Diagnosis not present

## 2021-07-27 NOTE — Telephone Encounter (Signed)
Pt is having problems with his A/V Shunt and is needing to discuss with RN or Provider. Please advise.

## 2021-07-28 NOTE — Telephone Encounter (Signed)
Called the patient back. Pt states that he is unstable on his feet and having some concerns of urinary incontinence. I asked the patient had he touched base with the office where Dr Arnoldo Morale works since he is the one who placed the VP shunt. He hadn't. Advised that since we don't have a way of looking into the VP shunt to determine if it is working, he should contact the [facility] office. Provided the phone number to their location. Pt was appreciative for the call back.

## 2021-09-06 ENCOUNTER — Encounter: Payer: Self-pay | Admitting: Physician Assistant

## 2021-09-06 NOTE — Progress Notes (Signed)
Cardiology Office Note    Date:  09/07/2021   ID:  SILVESTER REIERSON, DOB November 02, 1955, MRN [id number]  PCP:  Seward Carol, MD  Cardiologist:  Freada Bergeron, MD  Electrophysiologist:  None   Chief Complaint: f/u coronary calcification  History of Present Illness:   Luis Henderson (pronounced with hard C) is a 65 y.o. male with history of  HTN, HLD, anxiety, alcohol abuse, tobacco use, hydrocephalus s/p VP shunt, polycythemia (seen by heme-onc), borderline RBBB, multivessel coronary calcification and aortic atherosclerosis on CT chest and depression who presents for follow-up. He established care with Dr. Johney Frame in 02/2021 for coronary calcification seen on lung cancer screening CT. He did not have any anginal symptoms so risk factor modification was recommended with addition of statin (for LDL 144) and aspirin 81mg  daily.  He is seen back for follow-up today overall stable from cardiac standpoint. He denies any chest pain. He reports chronic sense of congestion and periodic cough but no accelerating dyspnea. He feels over the last few weeks he's developed a shuffling gait and has fallen a few times. This reminds him of his prior hydrocephalus issues. He states he called his neurologist and was encouraged to contact his neurosurgeon to be seen. He states "I feel like I am at the end of my Henderson" from the way his body feels "like an old man." Denies depression or SI. He does continue to smoke. He states he drinks more than he should.   Labwork independently reviewed: KPN 06/2021 K 4.3, Cr 0.910 03/2021 K 4.2, Cr 0.91, LDL 83, trig 71 11/2020 Hgb 17.3, LFTS ok 10/2019 TSH wnl   Past Medical History:  Diagnosis Date   Alcohol abuse    Anxiety    Aortic atherosclerosis (HCC)    Coronary artery calcification seen on CT scan    Depression    Diverticulitis 04/2019   History of kidney stones    History of prediabetes    Hyperlipidemia    Hypertension    Polycythemia     Tobacco use     Past Surgical History:  Procedure Laterality Date   CHOLECYSTECTOMY N/A 05/06/2019   Procedure: LAPAROSCOPIC CHOLECYSTECTOMY WITH INTRAOPERATIVE CHOLANGIOGRAM;  Surgeon: Donnie Mesa, MD;  Location: [facility];  Service: General;  Laterality: N/A;   COLONOSCOPY     CYSTOSCOPY WITH RETROGRADE PYELOGRAM, URETEROSCOPY AND STENT PLACEMENT Right 01/15/2015   Procedure: CYSTOSCOPY, RIGHT URETEROSCOPY/ RETROGRADE PYELOGRAM,HOLMIUM LASER/ LITHOTRIPSY, STENT PLACEMENT, URETERAL BALLOON DIALATION;  Surgeon: Kathie Rhodes, MD;  Location: [facility];  Service: Urology;  Laterality: Right;   INGUINAL HERNIA REPAIR Bilateral 08/05/2019   Procedure: OPEN BILATERAL INGUINAL HERNIA REPAIRS WITH MESH;  Surgeon: Donnie Mesa, MD;  Location: [facility];  Service: General;  Laterality: Bilateral;   INSERTION OF MESH Bilateral 08/05/2019   Procedure: INSERTION OF MESH;  Surgeon: Donnie Mesa, MD;  Location: [facility];  Service: General;  Laterality: Bilateral;   SKIN GRAFT     left hand   VENTRICULOPERITONEAL SHUNT Right 09/04/2016   Procedure: SHUNT INSERTION VENTRICULAR-PERITONEAL;  Surgeon: Newman Pies, MD;  Location: [facility];  Service: Neurosurgery;  Laterality: Right;   WISDOM TOOTH EXTRACTION      Current Medications: Current Meds  Medication Sig   albuterol (PROVENTIL) (2.5 MG/3ML) 0.083% nebulizer solution Take 2.5 mg by nebulization every 6 (six) hours.   albuterol (VENTOLIN HFA) 108 (90 Base) MCG/ACT inhaler SMARTSIG:1 Puff(s) By Mouth Every 4 Hours PRN   amLODipine (NORVASC) 10 MG tablet  Take 10 mg by mouth daily.   aspirin EC 81 MG tablet Take 81 mg by mouth daily. Swallow whole.   losartan (COZAAR) 50 MG tablet Take 1 tablet (50 mg total) by mouth daily.   ondansetron (ZOFRAN ODT) 4 MG disintegrating tablet Take 1 tablet (4 mg total) by mouth every 8 (eight) hours as needed for nausea or vomiting.   rosuvastatin (CRESTOR) 10 MG tablet Take 1 tablet (10  mg total) by mouth daily.     Allergies:   Codeine and Lisinopril   Social History   Socioeconomic History   Marital status: Divorced    Spouse name: Not on file   Number of children: 2   Years of education: Not on file   Highest education level: Not on file  Occupational History    Employer: [id number]    Comment: Radio broadcast assistant  Tobacco Use   Smoking status: Every Day    Packs/day: 1.00    Years: 28.00    Pack years: 28.00    Types: Cigarettes   Smokeless tobacco: Never  Vaping Use   Vaping Use: Some days  Substance and Sexual Activity   Alcohol use: Yes    Alcohol/week: 2.0 - 3.0 standard drinks    Types: 2 - 3 Cans of beer per week    Comment: daily alcohol use/drinks beer, wine, and liquor   Drug use: Yes    Types: Marijuana    Comment: 2x per week   Sexual activity: Not Currently  Other Topics Concern   Not on file  Social History Narrative   Not on file   Social Determinants of Health   Financial Resource Strain: Not on file  Food Insecurity: Not on file  Transportation Needs: Not on file  Physical Activity: Not on file  Stress: Not on file  Social Connections: Not on file     Family History:  The patient's family history includes Hyperlipidemia in his father. There is no history of Colon cancer, Esophageal cancer, Stomach cancer, or Pancreatic cancer.  ROS:   Please see the history of present illness.  All other systems are reviewed and otherwise negative.    EKGs/Labs/Other Studies Reviewed:    Studies reviewed are outlined and summarized above. Reports included below if pertinent.  Chest CT lung CA screening   IMPRESSION: 1. Lung-RADS 2S, benign appearance or behavior. Continue annual screening with low-dose chest CT without contrast in 12 months. 2. The "S" modifier above refers to potentially clinically significant non lung cancer related findings. Specifically, there is aortic atherosclerosis, in addition to left main and 3  vessel coronary artery disease. Please note that although the presence of coronary artery calcium documents the presence of coronary artery disease, the severity of this disease and any potential stenosis cannot be assessed on this non-gated CT examination. Assessment for potential risk factor modification, dietary therapy or pharmacologic therapy may be warranted, if clinically indicated. 3. Mild diffuse bronchial wall thickening with very mild centrilobular and paraseptal emphysema; imaging findings suggestive of underlying COPD.   Aortic Atherosclerosis (ICD10-I70.0) and Emphysema (ICD10-J43.9).      EKG:  EKG is ordered today, personally reviewed, demonstrating NSR 73bpm, RBBB one PAC, no acute STT changes. Similar to prior.  Recent Labs: 11/29/2020: ALT 18; Hemoglobin 17.3; Platelet Count 262 04/06/2021: BUN 16; Creatinine, Ser 0.91; Potassium 4.2; Sodium 141  Recent Lipid Panel    Component Value Date/Time   CHOL 163 04/06/2021 0813   TRIG 71 04/06/2021 0813   HDL 66  04/06/2021 0813   CHOLHDL 2.5 04/06/2021 0813   CHOLHDL 3.8 04/14/2016 1002   VLDL 28 04/14/2016 1002   LDLCALC 83 04/06/2021 0813    PHYSICAL EXAM:    VS:  BP 120/70   Pulse 73   Ht 5\' 11"  (1.803 m)   Wt 187 lb 6.4 oz (85 kg)   SpO2 97%   BMI 26.14 kg/m   BMI: Body mass index is 26.14 kg/m.  GEN: Well nourished, well developed male in no acute distress HEENT: normocephalic, atraumatic Neck: no JVD, carotid bruits, or masses Cardiac: RRR; no murmurs, rubs, or gallops, no edema  Respiratory: rare scattered expiratory wheeze, no rhonchi or rales, normal work of breathing GI: soft, nontender, nondistended, + BS MS: no deformity or atrophy Skin: warm and dry, no rash Neuro:  Alert and Oriented x 3, Strength and sensation are intact, follows commands Psych: euthymic mood, full affect  Wt Readings from Last 3 Encounters:  09/07/21 187 lb 6.4 oz (85 kg)  02/23/21 179 lb 9.6 oz (81.5 kg)  12/13/20 184  lb (83.5 kg)     ASSESSMENT & PLAN:   1. Coronary calcification and aortic atherosclerosis seen on CT - no accelerating anginal symptoms. Would continue risk factor modification with ASA, statin and surveillance for any new cardiac symptoms. He also reports drinking more than he should. We did discuss risk of continued ETOH use and stomach bleeding. See below regarding lipid management.  2. Essential HTN - BP controlled today, no changes made.  3. RBBB - seen as a borderline finding on prior EKGs, now QRS >116ms. Will obtain baseline echo for evaluation. May be related to underlying lung disease.  4. Hyperlipidemia goal LDL <70 - check fasting lipid panel and CMET when he comes in for echocardiogram.  5. Tobacco abuse with suspected COPD - scattered wheezing noted on exam today. CT 12/2020 showed mild diffuse bronchial wall thickening with very mild centrilobular and paraseptal emphysema; imaging findings suggestive of underlying COPD. He complains of intermittent sensation of congestion in the lungs and has to use inhaler. We discussed f/u PCP vs referral to pulmonology. He prefers the latter. Will obtain PFTs and refer to pulmonology.    Disposition: F/u with Dr. Johney Frame in 1 year. Given his general issues with shuffling gait, falls, patient-perceived failure to thrive I have strongly encouraged he schedule f/u visit with PCP. He has also been in contact with his neurologist and was encouraged to contact neurosurgery for follow-up.   Medication Adjustments/Labs and Tests Ordered: Current medicines are reviewed at length with the patient today.  Concerns regarding medicines are outlined above. Medication changes, Labs and Tests ordered today are summarized above and listed in the Patient Instructions accessible in Encounters.   Signed, Charlie Pitter, PA-C  09/07/2021 10:12 AM    [place] [place], [place], [place]  [address] Phone: [phone]; Fax: [phone]

## 2021-09-07 ENCOUNTER — Encounter: Payer: Self-pay | Admitting: Physician Assistant

## 2021-09-07 ENCOUNTER — Other Ambulatory Visit: Payer: Self-pay

## 2021-09-07 ENCOUNTER — Ambulatory Visit (INDEPENDENT_AMBULATORY_CARE_PROVIDER_SITE_OTHER): Payer: Medicare Other | Admitting: Physician Assistant

## 2021-09-07 VITALS — BP 120/70 | HR 73 | Ht 71.0 in | Wt 187.4 lb

## 2021-09-07 DIAGNOSIS — I2584 Coronary atherosclerosis due to calcified coronary lesion: Secondary | ICD-10-CM

## 2021-09-07 DIAGNOSIS — I4510 Unspecified right bundle-branch block: Secondary | ICD-10-CM | POA: Diagnosis not present

## 2021-09-07 DIAGNOSIS — I10 Essential (primary) hypertension: Secondary | ICD-10-CM

## 2021-09-07 DIAGNOSIS — I700 Atherosclerosis of aorta: Secondary | ICD-10-CM | POA: Diagnosis not present

## 2021-09-07 DIAGNOSIS — I2510 Atherosclerotic heart disease of native coronary artery without angina pectoris: Secondary | ICD-10-CM | POA: Diagnosis not present

## 2021-09-07 DIAGNOSIS — J449 Chronic obstructive pulmonary disease, unspecified: Secondary | ICD-10-CM

## 2021-09-07 DIAGNOSIS — Z720 Tobacco use: Secondary | ICD-10-CM

## 2021-09-07 DIAGNOSIS — E785 Hyperlipidemia, unspecified: Secondary | ICD-10-CM

## 2021-09-07 NOTE — Patient Instructions (Signed)
Medication Instructions:  Your physician recommends that you continue on your current medications as directed. Please refer to the Current Medication list given to you today.  *If you need a refill on your cardiac medications before your next appointment, please call your pharmacy*   Lab Work: WHEN COME IN FOR ECHO, COME FASTING FOR:  CMET & LIPID  If you have labs (blood work) drawn today and your tests are completely normal, you will receive your results only by: [place] (if you have MyChart) OR A paper copy in the mail If you have any lab test that is abnormal or we need to change your treatment, we will call you to review the results.   Testing/Procedures: Your physician has requested that you have an echocardiogram. Echocardiography is a painless test that uses sound waves to create images of your heart. It provides your doctor with information about the size and shape of your heart and how well your heart's chambers and valves are working. This procedure takes approximately one hour. There are no restrictions for this procedure.  Your physician has recommended that you have a pulmonary function test. Pulmonary Function Tests are a group of tests that measure how well air moves in and out of your lungs.  You have been referred to  Pulmonology   Follow-Up: At [facility], you and your health needs are our priority.  As part of our continuing mission to provide you with exceptional heart care, we have created designated Provider Care Teams.  These Care Teams include your primary Cardiologist (physician) and Advanced Practice Providers (APPs -  Physician Assistants and Nurse Practitioners) who all work together to provide you with the care you need, when you need it.  We recommend signing up for the patient portal called "MyChart".  Sign up information is provided on this After Visit Summary.  MyChart is used to connect with patients for Virtual Visits (Telemedicine).  Patients  are able to view lab/test results, encounter notes, upcoming appointments, etc.  Non-urgent messages can be sent to your provider as well.   To learn more about what you can do with MyChart, go to [email or web address].    Your next appointment:   12 month(s)  The format for your next appointment:   In Person  Provider:   Freada Bergeron, MD     Other Instructions

## 2021-09-09 ENCOUNTER — Encounter (HOSPITAL_COMMUNITY): Payer: Self-pay

## 2021-09-09 ENCOUNTER — Emergency Department (HOSPITAL_COMMUNITY): Payer: Medicare Other

## 2021-09-09 ENCOUNTER — Inpatient Hospital Stay (HOSPITAL_COMMUNITY)
Admission: EM | Admit: 2021-09-09 | Discharge: 2021-09-10 | DRG: 092 | Discharge status: Against Medical Advice | Payer: Medicare Other | Attending: Neurosurgery | Admitting: Neurosurgery

## 2021-09-09 ENCOUNTER — Other Ambulatory Visit: Payer: Self-pay

## 2021-09-09 DIAGNOSIS — W19XXXA Unspecified fall, initial encounter: Secondary | ICD-10-CM | POA: Diagnosis present

## 2021-09-09 DIAGNOSIS — S0990XA Unspecified injury of head, initial encounter: Secondary | ICD-10-CM

## 2021-09-09 DIAGNOSIS — E785 Hyperlipidemia, unspecified: Secondary | ICD-10-CM | POA: Diagnosis present

## 2021-09-09 DIAGNOSIS — G9389 Other specified disorders of brain: Secondary | ICD-10-CM | POA: Diagnosis not present

## 2021-09-09 DIAGNOSIS — Z83438 Family history of other disorder of lipoprotein metabolism and other lipidemia: Secondary | ICD-10-CM

## 2021-09-09 DIAGNOSIS — Z982 Presence of cerebrospinal fluid drainage device: Secondary | ICD-10-CM

## 2021-09-09 DIAGNOSIS — M47812 Spondylosis without myelopathy or radiculopathy, cervical region: Secondary | ICD-10-CM | POA: Diagnosis not present

## 2021-09-09 DIAGNOSIS — S0181XA Laceration without foreign body of other part of head, initial encounter: Secondary | ICD-10-CM | POA: Diagnosis not present

## 2021-09-09 DIAGNOSIS — F1010 Alcohol abuse, uncomplicated: Secondary | ICD-10-CM | POA: Diagnosis present

## 2021-09-09 DIAGNOSIS — Z20822 Contact with and (suspected) exposure to covid-19: Secondary | ICD-10-CM | POA: Diagnosis not present

## 2021-09-09 DIAGNOSIS — I700 Atherosclerosis of aorta: Secondary | ICD-10-CM | POA: Diagnosis present

## 2021-09-09 DIAGNOSIS — I10 Essential (primary) hypertension: Secondary | ICD-10-CM | POA: Diagnosis not present

## 2021-09-09 DIAGNOSIS — Y752 Prosthetic and other implants, materials and neurological devices associated with adverse incidents: Secondary | ICD-10-CM | POA: Diagnosis present

## 2021-09-09 DIAGNOSIS — Z7982 Long term (current) use of aspirin: Secondary | ICD-10-CM | POA: Diagnosis not present

## 2021-09-09 DIAGNOSIS — R519 Headache, unspecified: Secondary | ICD-10-CM | POA: Diagnosis present

## 2021-09-09 DIAGNOSIS — Z5329 Procedure and treatment not carried out because of patient's decision for other reasons: Secondary | ICD-10-CM | POA: Diagnosis not present

## 2021-09-09 DIAGNOSIS — F32A Depression, unspecified: Secondary | ICD-10-CM | POA: Diagnosis present

## 2021-09-09 DIAGNOSIS — Z87442 Personal history of urinary calculi: Secondary | ICD-10-CM

## 2021-09-09 DIAGNOSIS — Z9049 Acquired absence of other specified parts of digestive tract: Secondary | ICD-10-CM | POA: Diagnosis not present

## 2021-09-09 DIAGNOSIS — I2510 Atherosclerotic heart disease of native coronary artery without angina pectoris: Secondary | ICD-10-CM | POA: Diagnosis not present

## 2021-09-09 DIAGNOSIS — G919 Hydrocephalus, unspecified: Secondary | ICD-10-CM | POA: Diagnosis not present

## 2021-09-09 DIAGNOSIS — T8509XA Other mechanical complication of ventricular intracranial (communicating) shunt, initial encounter: Secondary | ICD-10-CM | POA: Diagnosis not present

## 2021-09-09 DIAGNOSIS — T8501XA Breakdown (mechanical) of ventricular intracranial (communicating) shunt, initial encounter: Secondary | ICD-10-CM | POA: Diagnosis not present

## 2021-09-09 DIAGNOSIS — S0101XA Laceration without foreign body of scalp, initial encounter: Secondary | ICD-10-CM | POA: Diagnosis not present

## 2021-09-09 DIAGNOSIS — F419 Anxiety disorder, unspecified: Secondary | ICD-10-CM | POA: Diagnosis present

## 2021-09-09 DIAGNOSIS — T85618A Breakdown (mechanical) of other specified internal prosthetic devices, implants and grafts, initial encounter: Secondary | ICD-10-CM | POA: Diagnosis not present

## 2021-09-09 DIAGNOSIS — Z79899 Other long term (current) drug therapy: Secondary | ICD-10-CM | POA: Diagnosis not present

## 2021-09-09 DIAGNOSIS — N200 Calculus of kidney: Secondary | ICD-10-CM | POA: Diagnosis not present

## 2021-09-09 DIAGNOSIS — F17210 Nicotine dependence, cigarettes, uncomplicated: Secondary | ICD-10-CM | POA: Diagnosis not present

## 2021-09-09 DIAGNOSIS — F1290 Cannabis use, unspecified, uncomplicated: Secondary | ICD-10-CM | POA: Diagnosis present

## 2021-09-09 LAB — BASIC METABOLIC PANEL
Anion gap: 11 (ref 5–15)
BUN: 11 mg/dL (ref 8–23)
CO2: 19 mmol/L — ABNORMAL LOW (ref 22–32)
Calcium: 9.5 mg/dL (ref 8.9–10.3)
Chloride: 104 mmol/L (ref 98–111)
Creatinine, Ser: 0.97 mg/dL (ref 0.61–1.24)
GFR, Estimated: >60 mL/min (ref >60)
Glucose, Bld: 97 mg/dL (ref 70–99)
Potassium: 4.1 mmol/L (ref 3.5–5.1)
Sodium: 134 mmol/L — ABNORMAL LOW (ref 135–145)

## 2021-09-09 LAB — CBC
HCT: 53.7 % — ABNORMAL HIGH (ref 39.0–52.0)
Hemoglobin: 17.7 g/dL — ABNORMAL HIGH (ref 13.0–17.0)
MCH: 30.8 pg (ref 26.0–34.0)
MCHC: 33 g/dL (ref 30.0–36.0)
MCV: 93.4 fL (ref 80.0–100.0)
Platelets: 318 10*3/uL (ref 150–400)
RBC: 5.75 MIL/uL (ref 4.22–5.81)
RDW: 13.1 % (ref 11.5–15.5)
WBC: 9.5 10*3/uL (ref 4.0–10.5)
nRBC: 0 % (ref 0.0–0.2)

## 2021-09-09 LAB — HIV ANTIBODY (ROUTINE TESTING W REFLEX): HIV Screen 4th Generation wRfx: NONREACTIVE

## 2021-09-09 MED ORDER — AMLODIPINE BESYLATE 10 MG PO TABS
10.0000 mg | ORAL_TABLET | Freq: Every day | ORAL | Status: DC
  Administered 2021-09-09 – 2021-09-10 (×2): 10 mg via ORAL
  Filled 2021-09-09: qty 2
  Filled 2021-09-09: qty 1

## 2021-09-09 MED ORDER — ACETAMINOPHEN 325 MG PO TABS: 650.0000 mg | ORAL_TABLET | Freq: Four times a day (QID) | ORAL | Status: DC | PRN

## 2021-09-09 MED ORDER — SODIUM CHLORIDE 0.9% FLUSH
3.0000 mL | Freq: Two times a day (BID) | INTRAVENOUS | Status: DC
  Administered 2021-09-09 (×2): 3 mL via INTRAVENOUS

## 2021-09-09 MED ORDER — ONDANSETRON HCL 4 MG/2ML IJ SOLN: 4.0000 mg | Freq: Four times a day (QID) | INTRAMUSCULAR | Status: DC | PRN

## 2021-09-09 MED ORDER — LOSARTAN POTASSIUM 50 MG PO TABS
50.0000 mg | ORAL_TABLET | Freq: Every day | ORAL | Status: DC
  Administered 2021-09-09 – 2021-09-10 (×2): 50 mg via ORAL
  Filled 2021-09-09 (×2): qty 1

## 2021-09-09 MED ORDER — ALBUTEROL SULFATE (2.5 MG/3ML) 0.083% IN NEBU: 2.5000 mg | INHALATION_SOLUTION | RESPIRATORY_TRACT | Status: DC | PRN

## 2021-09-09 MED ORDER — LIDOCAINE-EPINEPHRINE-TETRACAINE (LET) TOPICAL GEL
3.0000 mL | Freq: Once | TOPICAL | Status: AC
  Administered 2021-09-09: 3 mL via TOPICAL
  Filled 2021-09-09: qty 3

## 2021-09-09 MED ORDER — ACETAMINOPHEN 650 MG RE SUPP: 650.0000 mg | Freq: Four times a day (QID) | RECTAL | Status: DC | PRN

## 2021-09-09 MED ORDER — SODIUM CHLORIDE 0.9% FLUSH: 3.0000 mL | INTRAVENOUS | Status: DC | PRN

## 2021-09-09 MED ORDER — SODIUM CHLORIDE 0.9 % IV SOLN: 250.0000 mL | INTRAVENOUS | Status: DC | PRN

## 2021-09-09 MED ORDER — ONDANSETRON 4 MG PO TBDP: 4.0000 mg | ORAL_TABLET | Freq: Three times a day (TID) | ORAL | Status: DC | PRN

## 2021-09-09 MED ORDER — ACETAMINOPHEN 325 MG PO TABS
650.0000 mg | ORAL_TABLET | Freq: Once | ORAL | Status: AC | PRN
  Administered 2021-09-09: 650 mg via ORAL
  Filled 2021-09-09: qty 2

## 2021-09-09 MED ORDER — ROSUVASTATIN CALCIUM 5 MG PO TABS
10.0000 mg | ORAL_TABLET | Freq: Every day | ORAL | Status: DC
  Administered 2021-09-09 – 2021-09-10 (×2): 10 mg via ORAL
  Filled 2021-09-09 (×2): qty 2

## 2021-09-09 MED ORDER — HYDROCODONE-ACETAMINOPHEN 5-325 MG PO TABS
1.0000 | ORAL_TABLET | ORAL | Status: DC | PRN
  Administered 2021-09-09 – 2021-09-10 (×3): 2 via ORAL
  Filled 2021-09-09 (×3): qty 2

## 2021-09-09 MED ORDER — ONDANSETRON HCL 4 MG PO TABS: 4.0000 mg | ORAL_TABLET | Freq: Four times a day (QID) | ORAL | Status: DC | PRN

## 2021-09-09 MED ORDER — ALBUTEROL SULFATE (2.5 MG/3ML) 0.083% IN NEBU
2.5000 mg | INHALATION_SOLUTION | Freq: Four times a day (QID) | RESPIRATORY_TRACT | Status: DC
  Administered 2021-09-09: 2.5 mg via RESPIRATORY_TRACT
  Filled 2021-09-09 (×2): qty 3

## 2021-09-09 MED ORDER — ALBUTEROL SULFATE HFA 108 (90 BASE) MCG/ACT IN AERS: 1.0000 | INHALATION_SPRAY | RESPIRATORY_TRACT | Status: DC | PRN

## 2021-09-09 NOTE — ED Notes (Signed)
Provided pt bag lunch 

## 2021-09-09 NOTE — ED Triage Notes (Signed)
Pt reports he ran into a sliding glass door just PTA, glass did not break but he cut her head on the alluminum frame. Denies LOC, does not take a blood thinner. Bleeding controlled at this time.

## 2021-09-09 NOTE — ED Notes (Signed)
Pt given sandwich bag and drink

## 2021-09-09 NOTE — ED Provider Notes (Signed)
[facility] EMERGENCY DEPARTMENT Provider Note   CSN: [id number] Arrival date & time: 09/09/21  1026     History Chief Complaint  Patient presents with   Head Injury    URI TURNBOUGH is a 65 y.o. male.  This is a 65 y.o. 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  Patient reports he was ambulating, he has some chronic difficulty with ambulation worsened from his baseline over the past 2 wks or so. Intermittent headaches, concerned perhaps his shunt wasn't working properly. Called his NSGY but was unable to get an appointment. He hit the top portion of his head on window frame.  Window did not break.  He does not believe portions of the window frame are broken after he struck his head.  No blood thinners, no LOC, he was ambulatory after the event, no vomiting.  Does have a headache, mild nausea.  Bleeding eventually subsided with direct pressure.  Last tetanus shot within the last 5 years. No thinners.     The history is provided by the patient. No language interpreter was used.  Head Injury Associated symptoms: headache and nausea   Associated symptoms: no vomiting       Past Medical History:  Diagnosis Date   Alcohol abuse    Anxiety    Aortic atherosclerosis (HCC)    Coronary artery calcification seen on CT scan    Depression    Diverticulitis 04/2019   History of kidney stones    History of prediabetes    Hyperlipidemia    Hypertension    Polycythemia    Polycythemia    RBBB    Tobacco use     Patient Active Problem List   Diagnosis Date Noted   Shunt malfunction 09/09/2021   Arachnoid cyst of posterior cranial fossa 12/13/2020   Intermittent sleepiness 04/29/2020   Depressed mood 04/29/2020   Adenomatous polyp of colon 12/29/2019   Gastritis determined by endoscopy 12/29/2019   Bilateral  inguinal hernia 08/05/2019   Diverticulitis large intestine 05/04/2019   Inguinal hernia 10/03/2017   Secondary polycythemia 05/31/2017   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   Anxiety and depression    Chronic bilateral low back pain without sciatica    ETOH abuse    Tobacco abuse    HLD (hyperlipidemia) 09/04/2016   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[place]) 09/04/2016   Low back pain 06/09/2016   HTN (hypertension) 04/01/2015    Past Surgical History:  Procedure Laterality Date   CHOLECYSTECTOMY N/A 05/06/2019   Procedure: LAPAROSCOPIC CHOLECYSTECTOMY WITH INTRAOPERATIVE CHOLANGIOGRAM;  Surgeon: Donnie Mesa, MD;  Location: [place];  Service: General;  Laterality: N/A;   COLONOSCOPY     CYSTOSCOPY WITH RETROGRADE PYELOGRAM, URETEROSCOPY AND STENT PLACEMENT Right 01/15/2015   Procedure: CYSTOSCOPY, RIGHT URETEROSCOPY/ RETROGRADE PYELOGRAM,HOLMIUM LASER/ LITHOTRIPSY, STENT PLACEMENT, URETERAL BALLOON DIALATION;  Surgeon: Kathie Rhodes, MD;  Location: [facility];  Service: Urology;  Laterality: Right;   INGUINAL HERNIA REPAIR Bilateral 08/05/2019   Procedure: OPEN BILATERAL INGUINAL HERNIA REPAIRS WITH MESH;  Surgeon: Donnie Mesa, MD;  Location: [place];  Service: General;  Laterality: Bilateral;   INSERTION OF MESH Bilateral 08/05/2019   Procedure: INSERTION OF MESH;  Surgeon: Donnie Mesa, MD;  Location: [place];  Service: General;  Laterality: Bilateral;   SKIN GRAFT     left hand   VENTRICULOPERITONEAL SHUNT  Right 09/04/2016   Procedure: SHUNT INSERTION VENTRICULAR-PERITONEAL;  Surgeon: Newman Pies, MD;  Location: [place];  Service: Neurosurgery;  Laterality: Right;   WISDOM TOOTH EXTRACTION         Family History  Problem Relation Age of Onset   Hyperlipidemia Father    Colon cancer Neg Hx    Esophageal cancer Neg Hx    Stomach cancer Neg Hx    Pancreatic cancer Neg Hx     Social History   Tobacco Use   Smoking status: Every Day    Packs/day:  1.00    Years: 28.00    Pack years: 28.00    Types: Cigarettes   Smokeless tobacco: Never  Vaping Use   Vaping Use: Some days  Substance Use Topics   Alcohol use: Yes    Alcohol/week: 2.0 - 3.0 standard drinks    Types: 2 - 3 Cans of beer per week    Comment: daily alcohol use/drinks beer, wine, and liquor   Drug use: Yes    Types: Marijuana    Comment: 2x per week    Home Medications Prior to Admission medications   Medication Sig Start Date End Date Taking? Authorizing Provider  amLODipine (NORVASC) 10 MG tablet Take 10 mg by mouth daily.   Yes [provider]  aspirin EC 81 MG tablet Take 81 mg by mouth daily. Swallow whole.   Yes [provider]  losartan (COZAAR) 50 MG tablet Take 1 tablet (50 mg total) by mouth daily. 02/23/21  Yes Freada Bergeron, MD  rosuvastatin (CRESTOR) 10 MG tablet Take 1 tablet (10 mg total) by mouth daily. 02/23/21  Yes Freada Bergeron, MD  albuterol (PROVENTIL) (2.5 MG/3ML) 0.083% nebulizer solution Take 2.5 mg by nebulization every 6 (six) hours. Patient not taking: Reported on 09/09/2021 05/26/21   [provider]  albuterol (VENTOLIN HFA) 108 (90 Base) MCG/ACT inhaler SMARTSIG:1 Puff(s) By Mouth Every 4 Hours PRN Patient not taking: Reported on 09/09/2021 05/26/21   [provider]  ondansetron (ZOFRAN ODT) 4 MG disintegrating tablet Take 1 tablet (4 mg total) by mouth every 8 (eight) hours as needed for nausea or vomiting. Patient not taking: Reported on 09/09/2021 03/08/20   Deno Etienne, DO    Allergies    Codeine and Lisinopril  Review of Systems   Review of Systems  Constitutional:  Negative for chills and fever.  HENT:  Negative for facial swelling and trouble swallowing.   Eyes:  Negative for photophobia and visual disturbance.  Respiratory:  Negative for cough and shortness of breath.   Cardiovascular:  Negative for chest pain and palpitations.  Gastrointestinal:  Positive for nausea. Negative  for abdominal pain and vomiting.  Endocrine: Negative for polydipsia and polyuria.  Genitourinary:  Negative for difficulty urinating and hematuria.  Musculoskeletal:  Negative for gait problem and joint swelling.  Skin:  Positive for wound. Negative for pallor and rash.  Neurological:  Positive for headaches. Negative for syncope.  Psychiatric/Behavioral:  Negative for agitation and confusion.    Physical Exam Updated Vital Signs BP (!) 157/84   Pulse 70   Temp 98.6 F (37 C) (Oral)   Resp 14   Ht 5\' 11"  (1.803 m)   Wt 81.6 kg   SpO2 98%   BMI 25.10 kg/m   Physical Exam Vitals and nursing note reviewed.  Constitutional:      General: He is not in acute distress.    Appearance: He is well-developed.  HENT:  Head: Normocephalic.     Comments: Laceration to superior portion of skull, bleeding controlled. No bony depression     Right Ear: External ear normal.     Left Ear: External ear normal.     Mouth/Throat:     Mouth: Mucous membranes are moist.     Comments: Poor dentition  Eyes:     General: No scleral icterus.    Extraocular Movements: Extraocular movements intact.     Pupils: Pupils are equal, round, and reactive to light.  Cardiovascular:     Rate and Rhythm: Normal rate and regular rhythm.     Pulses: Normal pulses.     Heart sounds: Normal heart sounds.  Pulmonary:     Effort: Pulmonary effort is normal. No respiratory distress.     Breath sounds: Normal breath sounds.  Abdominal:     General: Abdomen is flat.     Palpations: Abdomen is soft.     Tenderness: There is no abdominal tenderness.  Musculoskeletal:        General: Normal range of motion.     Cervical back: Normal range of motion.     Right lower leg: No edema.     Left lower leg: No edema.  Skin:    General: Skin is warm and dry.     Capillary Refill: Capillary refill takes less than 2 seconds.  Neurological:     Mental Status: He is alert and oriented to person, place, and time.      GCS: GCS eye subscore is 4. GCS verbal subscore is 5. GCS motor subscore is 6.     Cranial Nerves: Cranial nerves 2-12 are intact.     Sensory: Sensation is intact.     Motor: Motor function is intact.     Coordination: Coordination is intact.     Gait: Gait is intact.  Psychiatric:        Mood and Affect: Mood normal.        Behavior: Behavior normal.    ED Results / Procedures / Treatments   Labs (all labs ordered are listed, but only abnormal results are displayed) Labs Reviewed  HIV ANTIBODY (ROUTINE TESTING W REFLEX)  BASIC METABOLIC PANEL  CBC    EKG None  Radiology DG Skull 1-3 Views  Result Date: 09/09/2021 CLINICAL DATA:  Patient fell into a sliding glass door and hit head on metal frame of door. Notable laceration on top of head and patient is now experiencing headaches. EXAM: SKULL - 1-3 VIEW COMPARISON:  05/31/2017 FINDINGS: No fracture or bone lesion. Stable right parietal ventriculostomy catheter. Sinuses are clear. IMPRESSION: No fracture or acute finding. Electronically Signed   By: Lajean Manes M.D.   On: 09/09/2021 12:46   DG Chest 1 View  Result Date: 09/09/2021 CLINICAL DATA:  Evaluate for shunt malfunction EXAM: CHEST  1 VIEW COMPARISON:  Chest radiograph 07/27/2021 FINDINGS: VP shunt tubing is seen coursing over the right hemithorax. There is no fracture or kink in the tubing to the level imaged. The cardiomediastinal silhouette is normal. There is no focal consolidation or pulmonary edema. There is no pleural effusion or pneumothorax. There is no acute osseous abnormality. IMPRESSION: The portion of the VP shunt tubing coursing over the right hemithorax is intact, without fracture or kink. Electronically Signed   By: Valetta Mole M.D.   On: 09/09/2021 12:49   DG Cervical Spine 1 View  Result Date: 09/09/2021 CLINICAL DATA:  Patient fell into a sliding glass door and hit  head on metal frame of door. Notable laceration on top of head and patient is now  experiencing headaches. EXAM: DG CERVICAL SPINE - 1 VIEW COMPARISON:  05/31/2017 FINDINGS: Single AP view of the cervical spine and neck. No convincing fracture or bone lesion. Degenerative changes are noted of the cervical spine, stable. Carotid artery vascular calcifications are stable. Right-sided ventriculostomy catheter extends across the neck and right anterior chest, intact and stable. IMPRESSION: 1. No acute findings. 2. Intact visualized portion of the ventriculoperitoneal catheter. Electronically Signed   By: Lajean Manes M.D.   On: 09/09/2021 12:48   DG Abd 1 View  Result Date: 09/09/2021 CLINICAL DATA:  Shunt malfunction. EXAM: ABDOMEN - 1 VIEW COMPARISON:  May 03, 2019. FINDINGS: The bowel gas pattern is normal. Stable right renal calculus. Status post cholecystectomy. Visualized portion of right ventriculoperitoneal shunt appears intact. IMPRESSION: Visualized portion of right ventriculoperitoneal shunt appears intact. Stable right renal calculus. Electronically Signed   By: Marijo Conception M.D.   On: 09/09/2021 12:47   CT HEAD WO CONTRAST (5MM)  Result Date: 09/09/2021 CLINICAL DATA:  Head trauma EXAM: CT HEAD WITHOUT CONTRAST TECHNIQUE: Contiguous axial images were obtained from the base of the skull through the vertex without intravenous contrast. COMPARISON:  CT head 06/18/2019 FINDINGS: Brain: No acute intracranial hemorrhage, mass effect, or midline shift. No extra-axial fluid collections. No evidence of an acute territorial infarct. Stable position of a right parietal approach ventriculostomy shunt catheter with the tip in the left lateral ventricle. There is mild hydrocephalus with diffuse increased size of the ventricles since previous study when the ventricles were slit-like. Stable to slightly increased size of a rounded CSF attenuation structure in the aqueductal region measuring 2.5 x 2.3 cm, with mass effect on the dorsal aspect of the midbrain. Patchy periventricular  hypodensities which are increased since previous study and may represent subependymal flow of CSF. Similar patchy hypodensities surrounding the shunt in the right parietal lobe. Vascular: Mild calcified plaques in the carotid siphons. Skull: No acute fracture identified. Sinuses/Orbits: No acute finding. Other: None. IMPRESSION: 1. Right parietal approach ventriculostomy shunt catheter with the tip in the left lateral ventricle. Interval development of mild hydrocephalus and patchy periventricular hypodensities suggesting subependymal flow of CSF. Correlate clinically and consider follow-up shunt series and neurosurgical consultation. 2. Stable to slightly increased size of a chronic CSF attenuation cystic structure dorsal to the midbrain. 3. No acute intracranial hemorrhage. Electronically Signed   By: Ofilia Neas M.D.   On: 09/09/2021 12:59    Procedures .Marland KitchenLaceration Repair  Date/Time: 09/09/2021 3:03 PM Performed by: Jeanell Sparrow, DO Authorized by: Jeanell Sparrow, DO   Consent:    Consent obtained:  Verbal   Consent given by:  Patient   Risks, benefits, and alternatives were discussed: yes     Risks discussed:  Infection, pain and need for additional repair   Alternatives discussed:  No treatment and delayed treatment Universal protocol:    Procedure explained and questions answered to patient or proxy's satisfaction: yes     Immediately prior to procedure, a time out was called: yes     Patient identity confirmed:  Verbally with patient and arm band Anesthesia:    Anesthesia method:  Topical application   Topical anesthetic:  LET Laceration details:    Location:  Scalp   Scalp location:  Crown   Length (cm):  4   Depth (mm):  0.3 Pre-procedure details:    Preparation:  Patient was prepped  and draped in usual sterile fashion and imaging obtained to evaluate for foreign bodies Exploration:    Hemostasis achieved with:  Direct pressure   Imaging obtained: x-ray     Imaging  outcome: foreign body not noted     Wound exploration: wound explored through full range of motion and entire depth of wound visualized     Contaminated: no   Treatment:    Area cleansed with:  Saline   Amount of cleaning:  Extensive   Irrigation solution:  Sterile saline   Irrigation volume:  20   Irrigation method:  Syringe   Debridement:  None   Undermining:  None Skin repair:    Repair method:  Staples   Number of staples:  5 Approximation:    Approximation:  Close Repair type:    Repair type:  Simple Post-procedure details:    Dressing:  Non-adherent dressing   Procedure completion:  Tolerated well, no immediate complications   Medications Ordered in ED Medications  amLODipine (NORVASC) tablet 10 mg (has no administration in time range)  losartan (COZAAR) tablet 50 mg (has no administration in time range)  rosuvastatin (CRESTOR) tablet 10 mg (has no administration in time range)  albuterol (PROVENTIL) (2.5 MG/3ML) 0.083% nebulizer solution 2.5 mg (2.5 mg Nebulization Not Given 09/09/21 1502)  sodium chloride flush (NS) 0.9 % injection 3 mL (3 mLs Intravenous Given 09/09/21 1435)  sodium chloride flush (NS) 0.9 % injection 3 mL (3 mLs Intravenous Given 09/09/21 1435)  sodium chloride flush (NS) 0.9 % injection 3 mL (has no administration in time range)  0.9 %  sodium chloride infusion (has no administration in time range)  acetaminophen (TYLENOL) tablet 650 mg (has no administration in time range)    Or  acetaminophen (TYLENOL) suppository 650 mg (has no administration in time range)  HYDROcodone-acetaminophen (NORCO/VICODIN) 5-325 MG per tablet 1-2 tablet (has no administration in time range)  ondansetron (ZOFRAN) tablet 4 mg (has no administration in time range)    Or  ondansetron (ZOFRAN) injection 4 mg (has no administration in time range)  acetaminophen (TYLENOL) tablet 650 mg (650 mg Oral Given 09/09/21 1037)  lidocaine-EPINEPHrine-tetracaine (LET) topical gel (3 mLs  Topical Given 09/09/21 1417)    ED Course  I have reviewed the triage vital signs and the nursing notes.  Pertinent labs & imaging results that were available during my care of the patient were reviewed by me and considered in my medical decision making (see chart for details).  Clinical Course as of 09/09/21 1504  Fri Sep 09, 2021  1336 CTH abnormal, possible hydrocephalus; pt with HA and nausea, head trauma on same side of scalp as VP shunt. Will consult NSGY [SG]  1347 D/w Dr Trenton Gammon who recommends observation overnight with NSGY service, he will come to evaluate the pt [SG]    Clinical Course User Index [SG] Jeanell Sparrow, DO   MDM Rules/Calculators/A&P                           CC: Head injury, headache  This patient complains of above; this involves an extensive number of treatment options and is a complaint that carries with it a high risk of complications and morbidity. Vital signs were reviewed. Serious etiologies considered.  Record review:  Previous records obtained and reviewed   Work up as above, notable for:  imaging results that were available during my care of the patient were reviewed by me and considered  in my medical decision making.   I ordered imaging studies which included CT head, shunt series and I independently visualized and interpreted imaging which showed CTH was abnormal, will discuss with NSG  Management: Patient with head injury secondary to direct trauma.  He has headache, mild nausea, gait disturbance last 2 wks.  History of VP shunt.  Given age will obtain CT head.  Also obtain shunt series to evaluate VP shunt as patient with headache, mild nausea, recent worsening gait status.  No vomiting.  Hemodynamics are stable.   Given apap, HA improved   Lac repaired at bedside, see note, tetanus status is UTD  Reassessment:  D/w Dr Trenton Gammon of [place] regarding abnormal CTH, recommends admit, perhaps shunt replacement/revision. He will come to assess the pt.  Will admit to Dr Arnoldo Morale NSGY       This chart was dictated using voice recognition software.  Despite best efforts to proofread,  errors can occur which can change the documentation meaning.  Final Clinical Impression(s) / ED Diagnoses Final diagnoses:  Headache  Injury of head, initial encounter  VP (ventriculoperitoneal) shunt status  Laceration of scalp, initial encounter  Shunt malfunction, initial encounter    Rx / DC Orders ED Discharge Orders     None        Jeanell Sparrow, DO 09/09/21 1504

## 2021-09-09 NOTE — H&P (Addendum)
Luis Henderson is an 65 y.o. male.   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  Patient had been doing well until approximately 1 month ago when he noted some increasing balance and coordination issues.  The patient contacted Dr. Cam Hai office but had difficulty arranging follow-up.  The patient recently suffered a fall with a significant laceration in his left occipital region.  He does note some ongoing headaches.  He is having no other symptoms.  Head CT scan demonstrates significant ventriculomegaly consistent with VP shunt malfunction.  The patient notes mild headache.  He feels a little unsteady with walking.  He is having no urinary incontinence.  He is in no extremis.  Patient chronically takes aspirin  Past Medical History:  Diagnosis Date   Alcohol abuse    Anxiety    Aortic atherosclerosis (HCC)    Coronary artery calcification seen on CT scan    Depression    Diverticulitis 04/2019   History of kidney stones    History of prediabetes    Hyperlipidemia    Hypertension    Polycythemia    Polycythemia    RBBB    Tobacco use     Past Surgical History:  Procedure Laterality Date   CHOLECYSTECTOMY N/A 05/06/2019   Procedure: LAPAROSCOPIC CHOLECYSTECTOMY WITH INTRAOPERATIVE CHOLANGIOGRAM;  Surgeon: Donnie Mesa, MD;  Location: [facility];  Service: General;  Laterality: N/A;   COLONOSCOPY     CYSTOSCOPY WITH RETROGRADE PYELOGRAM, URETEROSCOPY AND STENT PLACEMENT Right 01/15/2015   Procedure: CYSTOSCOPY, RIGHT URETEROSCOPY/ RETROGRADE PYELOGRAM,HOLMIUM LASER/ LITHOTRIPSY, STENT PLACEMENT, URETERAL BALLOON DIALATION;  Surgeon: Kathie Rhodes, MD;  Location: [facility];  Service: Urology;  Laterality: Right;   INGUINAL HERNIA REPAIR Bilateral 08/05/2019   Procedure: OPEN BILATERAL INGUINAL HERNIA REPAIRS WITH MESH;  Surgeon: Donnie Mesa, MD;  Location: [facility];  Service: General;  Laterality: Bilateral;   INSERTION OF MESH Bilateral 08/05/2019   Procedure: INSERTION OF MESH;  Surgeon: Donnie Mesa, MD;  Location: [facility];  Service: General;  Laterality: Bilateral;   SKIN GRAFT     left hand   VENTRICULOPERITONEAL SHUNT Right 09/04/2016   Procedure: SHUNT INSERTION VENTRICULAR-PERITONEAL;  Surgeon: Newman Pies, MD;  Location: [facility];  Service: Neurosurgery;  Laterality: Right;   WISDOM TOOTH EXTRACTION      Family History  Problem Relation Age of Onset   Hyperlipidemia Father    Colon cancer Neg Hx    Esophageal cancer Neg Hx    Stomach cancer Neg Hx    Pancreatic cancer Neg Hx    Social History:  reports that he has been smoking cigarettes. He has a 28.00 pack-year smoking history. He has never used smokeless tobacco. He reports current alcohol use of about 2.0 - 3.0 standard drinks per week. He reports current drug use. Drug: Marijuana.  Allergies:  Allergies  Allergen Reactions   Codeine Itching   Lisinopril Cough    (Not in a hospital admission)   No results found for this or any previous visit (from the past 48 hour(s)). DG Skull 1-3 Views  Result Date: 09/09/2021 CLINICAL DATA:  Patient fell into a sliding glass door and hit head on metal frame of door. Notable laceration on top of head and patient is now experiencing headaches. EXAM: SKULL - 1-3 VIEW COMPARISON:  05/31/2017 FINDINGS: No fracture or bone lesion. Stable right parietal ventriculostomy catheter. Sinuses are clear. IMPRESSION: No  fracture or acute finding. Electronically Signed   By: Lajean Manes M.D.   On: 09/09/2021 12:46   DG Chest 1 View  Result Date: 09/09/2021 CLINICAL DATA:  Evaluate for shunt malfunction EXAM: CHEST  1 VIEW COMPARISON:  Chest radiograph 07/27/2021 FINDINGS: VP shunt tubing is seen coursing over the right hemithorax. There is no fracture or kink in the tubing to the level imaged. The cardiomediastinal  silhouette is normal. There is no focal consolidation or pulmonary edema. There is no pleural effusion or pneumothorax. There is no acute osseous abnormality. IMPRESSION: The portion of the VP shunt tubing coursing over the right hemithorax is intact, without fracture or kink. Electronically Signed   By: Valetta Mole M.D.   On: 09/09/2021 12:49   DG Cervical Spine 1 View  Result Date: 09/09/2021 CLINICAL DATA:  Patient fell into a sliding glass door and hit head on metal frame of door. Notable laceration on top of head and patient is now experiencing headaches. EXAM: DG CERVICAL SPINE - 1 VIEW COMPARISON:  05/31/2017 FINDINGS: Single AP view of the cervical spine and neck. No convincing fracture or bone lesion. Degenerative changes are noted of the cervical spine, stable. Carotid artery vascular calcifications are stable. Right-sided ventriculostomy catheter extends across the neck and right anterior chest, intact and stable. IMPRESSION: 1. No acute findings. 2. Intact visualized portion of the ventriculoperitoneal catheter. Electronically Signed   By: Lajean Manes M.D.   On: 09/09/2021 12:48   DG Abd 1 View  Result Date: 09/09/2021 CLINICAL DATA:  Shunt malfunction. EXAM: ABDOMEN - 1 VIEW COMPARISON:  May 03, 2019. FINDINGS: The bowel gas pattern is normal. Stable right renal calculus. Status post cholecystectomy. Visualized portion of right ventriculoperitoneal shunt appears intact. IMPRESSION: Visualized portion of right ventriculoperitoneal shunt appears intact. Stable right renal calculus. Electronically Signed   By: Marijo Conception M.D.   On: 09/09/2021 12:47   CT HEAD WO CONTRAST (5MM)  Result Date: 09/09/2021 CLINICAL DATA:  Head trauma EXAM: CT HEAD WITHOUT CONTRAST TECHNIQUE: Contiguous axial images were obtained from the base of the skull through the vertex without intravenous contrast. COMPARISON:  CT head 06/18/2019 FINDINGS: Brain: No acute intracranial hemorrhage, mass effect, or  midline shift. No extra-axial fluid collections. No evidence of an acute territorial infarct. Stable position of a right parietal approach ventriculostomy shunt catheter with the tip in the left lateral ventricle. There is mild hydrocephalus with diffuse increased size of the ventricles since previous study when the ventricles were slit-like. Stable to slightly increased size of a rounded CSF attenuation structure in the aqueductal region measuring 2.5 x 2.3 cm, with mass effect on the dorsal aspect of the midbrain. Patchy periventricular hypodensities which are increased since previous study and may represent subependymal flow of CSF. Similar patchy hypodensities surrounding the shunt in the right parietal lobe. Vascular: Mild calcified plaques in the carotid siphons. Skull: No acute fracture identified. Sinuses/Orbits: No acute finding. Other: None. IMPRESSION: 1. Right parietal approach ventriculostomy shunt catheter with the tip in the left lateral ventricle. Interval development of mild hydrocephalus and patchy periventricular hypodensities suggesting subependymal flow of CSF. Correlate clinically and consider follow-up shunt series and neurosurgical consultation. 2. Stable to slightly increased size of a chronic CSF attenuation cystic structure dorsal to the midbrain. 3. No acute intracranial hemorrhage. Electronically Signed   By: Ofilia Neas M.D.   On: 09/09/2021 12:59    Pertinent items noted in HPI and remainder of comprehensive ROS otherwise negative.  Blood  pressure (!) 157/84, pulse 70, temperature 98.6 F (37 C), temperature source Oral, resp. rate 14, height 5\' 11"  (1.803 m), weight 81.6 kg, SpO2 98 %.  Patient is awake and alert.  He is oriented and appropriate.  His speech is fluent.  His judgment and insight are intact.  His cranial nerve function is normal bilateral.  Motor examination 5/5 bilateral.  Sensory examination normal.  He has some mild ataxia but otherwise neurologic exam  is unremarkable.  Shunt pumps but does not refill easily.  Shunt track free from injury or obvious dislocation.  Patient with a left occipital laceration which is in the process of being closed by the ED provider.  No bony abnormality. Assessment/Plan Patient with evidence of VP shunt malfunction.  Currently the patient is doing reasonably well clinically and given his recent fall and laceration coupled with his ongoing aspirin use, I am not anxious to do shunt revision emergently.  Plan to admit to the hospital for observation with evaluation by Dr. Arnoldo Morale on Monday with probable VP shunt revision Monday.  Mallie Mussel A Chevelle Durr 09/09/2021, 1:56 PM

## 2021-09-09 NOTE — ED Notes (Signed)
Pt given warm blanket.

## 2021-09-10 NOTE — Progress Notes (Signed)
Pt left AMA after speaking with Dr. Ronnald Ramp, expressing desire to smoke.  He stated he was not willing to wait an extended period of time without smoking.  Signed AMA disclosure and ambulated to exit with belongings.

## 2021-09-10 NOTE — Progress Notes (Signed)
Subjective: Patient reports no headache or NTW, he is walking in the room well.  He states he has had symptoms for 3 to 4 months "but only recently noted that they were a problem" after he fell.  He would really like to go home to "feed my cats, eat my food, and smoke a cigarette.  I have been smoking since I was 65yrs old. "  Unfortunately he has nobody at home but "I have neighbors all around me."  Objective: Vital signs in last 24 hours: Temp:  [98.2 F (36.8 C)-98.6 F (37 C)] 98.3 F (36.8 C) (11/26 0759) Pulse Rate:  [63-80] 72 (11/26 0759) Resp:  [14-18] 18 (11/26 0759) BP: (132-170)/(63-138) 160/81 (11/26 0759) SpO2:  [95 %-100 %] 98 % (11/26 0759) Weight:  [81.6 kg] 81.6 kg (11/25 1034)  Intake/Output from previous day: 11/25 0701 - 11/26 0700 In: 6 [I.V.:6] Out: -  Intake/Output this shift: No intake/output data recorded.  Neurologic: Grossly normal, wide-awake and alert and conversant and interactive, no facial asymmetry, walks normally, no focal motor weakness  Lab Results: Lab Results  Component Value Date   WBC 9.5 09/09/2021   HGB 17.7 (H) 09/09/2021   HCT 53.7 (H) 09/09/2021   MCV 93.4 09/09/2021   PLT 318 09/09/2021   Lab Results  Component Value Date   INR 1.03 09/04/2016   BMET Lab Results  Component Value Date   NA 134 (L) 09/09/2021   K 4.1 09/09/2021   CL 104 09/09/2021   CO2 19 (L) 09/09/2021   GLUCOSE 97 09/09/2021   BUN 11 09/09/2021   CREATININE 0.97 09/09/2021   CALCIUM 9.5 09/09/2021    Studies/Results: DG Skull 1-3 Views  Result Date: 09/09/2021 CLINICAL DATA:  Patient fell into a sliding glass door and hit head on metal frame of door. Notable laceration on top of head and patient is now experiencing headaches. EXAM: SKULL - 1-3 VIEW COMPARISON:  05/31/2017 FINDINGS: No fracture or bone lesion. Stable right parietal ventriculostomy catheter. Sinuses are clear. IMPRESSION: No fracture or acute finding. Electronically Signed   By: Lajean Manes M.D.   On: 09/09/2021 12:46   DG Chest 1 View  Result Date: 09/09/2021 CLINICAL DATA:  Evaluate for shunt malfunction EXAM: CHEST  1 VIEW COMPARISON:  Chest radiograph 07/27/2021 FINDINGS: VP shunt tubing is seen coursing over the right hemithorax. There is no fracture or kink in the tubing to the level imaged. The cardiomediastinal silhouette is normal. There is no focal consolidation or pulmonary edema. There is no pleural effusion or pneumothorax. There is no acute osseous abnormality. IMPRESSION: The portion of the VP shunt tubing coursing over the right hemithorax is intact, without fracture or kink. Electronically Signed   By: Valetta Mole M.D.   On: 09/09/2021 12:49   DG Cervical Spine 1 View  Result Date: 09/09/2021 CLINICAL DATA:  Patient fell into a sliding glass door and hit head on metal frame of door. Notable laceration on top of head and patient is now experiencing headaches. EXAM: DG CERVICAL SPINE - 1 VIEW COMPARISON:  05/31/2017 FINDINGS: Single AP view of the cervical spine and neck. No convincing fracture or bone lesion. Degenerative changes are noted of the cervical spine, stable. Carotid artery vascular calcifications are stable. Right-sided ventriculostomy catheter extends across the neck and right anterior chest, intact and stable. IMPRESSION: 1. No acute findings. 2. Intact visualized portion of the ventriculoperitoneal catheter. Electronically Signed   By: Lajean Manes M.D.   On: 09/09/2021  12:48   DG Abd 1 View  Result Date: 09/09/2021 CLINICAL DATA:  Shunt malfunction. EXAM: ABDOMEN - 1 VIEW COMPARISON:  May 03, 2019. FINDINGS: The bowel gas pattern is normal. Stable right renal calculus. Status post cholecystectomy. Visualized portion of right ventriculoperitoneal shunt appears intact. IMPRESSION: Visualized portion of right ventriculoperitoneal shunt appears intact. Stable right renal calculus. Electronically Signed   By: Marijo Conception M.D.   On: 09/09/2021  12:47   CT HEAD WO CONTRAST (5MM)  Result Date: 09/09/2021 CLINICAL DATA:  Head trauma EXAM: CT HEAD WITHOUT CONTRAST TECHNIQUE: Contiguous axial images were obtained from the base of the skull through the vertex without intravenous contrast. COMPARISON:  CT head 06/18/2019 FINDINGS: Brain: No acute intracranial hemorrhage, mass effect, or midline shift. No extra-axial fluid collections. No evidence of an acute territorial infarct. Stable position of a right parietal approach ventriculostomy shunt catheter with the tip in the left lateral ventricle. There is mild hydrocephalus with diffuse increased size of the ventricles since previous study when the ventricles were slit-like. Stable to slightly increased size of a rounded CSF attenuation structure in the aqueductal region measuring 2.5 x 2.3 cm, with mass effect on the dorsal aspect of the midbrain. Patchy periventricular hypodensities which are increased since previous study and may represent subependymal flow of CSF. Similar patchy hypodensities surrounding the shunt in the right parietal lobe. Vascular: Mild calcified plaques in the carotid siphons. Skull: No acute fracture identified. Sinuses/Orbits: No acute finding. Other: None. IMPRESSION: 1. Right parietal approach ventriculostomy shunt catheter with the tip in the left lateral ventricle. Interval development of mild hydrocephalus and patchy periventricular hypodensities suggesting subependymal flow of CSF. Correlate clinically and consider follow-up shunt series and neurosurgical consultation. 2. Stable to slightly increased size of a chronic CSF attenuation cystic structure dorsal to the midbrain. 3. No acute intracranial hemorrhage. Electronically Signed   By: Ofilia Neas M.D.   On: 09/09/2021 12:59    Assessment/Plan: I think he probably has fairly well compensated hydrocephalus, but I am not comfortable sending him home.  I have spoken with Dr. Arnoldo Morale and with Dr. Annette Stable.  We recently  stopped his aspirin just yesterday he is not in extremis so I do not believe early surgery is absolutely necessary and may carry increased risk.  Therefore, I recommend continued hospitalization and observation until his surgery can be performed in a few days.  Dr. Arnoldo Morale and Dr. Annette Stable agree with this.  Estimated body mass index is 25.1 kg/m as calculated from the following:   Height as of this encounter: 5\' 11"  (1.803 m).   Weight as of this encounter: 81.6 kg.    LOS: 1 day    Eustace Moore 09/10/2021, 10:25 AM

## 2021-09-10 NOTE — Progress Notes (Signed)
Pt expressing desire to go outside and "walk around".  When asked if this included smoking, he stated it did.  I informed him that this was a non-smoking facility and that it was not an option at this time, but that I would attempt to get a nicotine patch for him if I could.  He stated "they never worked anyway".

## 2021-09-13 ENCOUNTER — Encounter (HOSPITAL_COMMUNITY): Payer: Self-pay | Admitting: Neurosurgery

## 2021-09-13 ENCOUNTER — Other Ambulatory Visit: Payer: Self-pay

## 2021-09-13 ENCOUNTER — Other Ambulatory Visit: Payer: Self-pay | Admitting: Neurosurgery

## 2021-09-13 DIAGNOSIS — G930 Cerebral cysts: Secondary | ICD-10-CM | POA: Diagnosis not present

## 2021-09-13 DIAGNOSIS — T8509XA Other mechanical complication of ventricular intracranial (communicating) shunt, initial encounter: Secondary | ICD-10-CM | POA: Diagnosis not present

## 2021-09-13 DIAGNOSIS — I10 Essential (primary) hypertension: Secondary | ICD-10-CM | POA: Diagnosis not present

## 2021-09-13 DIAGNOSIS — G911 Obstructive hydrocephalus: Secondary | ICD-10-CM | POA: Diagnosis not present

## 2021-09-13 NOTE — Progress Notes (Signed)
Luis Henderson denies chest pain or shortness of breath. Patient denies having any s/s of Covid in his household.  Patient denies any known exposure to Covid.   Luis Henderson PCP is Dr. Delfina Redwood. Cardiologist is Dr,Heather Johney Frame.  I instructed Luis Henderson to shower with antibiotic soap, if it is available.  Dry off with a clean towel. Do not put lotion, powder, cologne or deodorant or makeup.No jewelry or piercings. Men may shave their face and neck. Woman should not shave. No nail polish, artificial or acrylic nails. Wear clean clothes, brush your teeth. Glasses, contact lens,dentures or partials may not be worn in the OR. If you need to wear them, please bring a case for glasses, do not wear contacts or bring a case, the hospital does not have contact cases, dentures or partials will have to be removed , make sure they are clean, we will provide a denture cup to put them in. You will need some one to drive you home and a responsible person over the age of 72 to stay with you for the first 24 hours after surgery.

## 2021-09-14 ENCOUNTER — Ambulatory Visit (HOSPITAL_COMMUNITY): Payer: Medicare Other | Admitting: Certified Registered"

## 2021-09-14 ENCOUNTER — Encounter (HOSPITAL_COMMUNITY)
Admission: RE | Disposition: A | Discharge status: Home / Self Care | Payer: Self-pay | Source: Ambulatory Visit | Attending: Neurosurgery

## 2021-09-14 ENCOUNTER — Other Ambulatory Visit: Payer: Self-pay

## 2021-09-14 ENCOUNTER — Encounter (HOSPITAL_COMMUNITY): Payer: Self-pay | Admitting: Neurosurgery

## 2021-09-14 ENCOUNTER — Inpatient Hospital Stay (HOSPITAL_COMMUNITY)
Admission: RE | Admit: 2021-09-14 | Discharge: 2021-09-15 | DRG: 032 | Disposition: A | Discharge status: Home / Self Care | Payer: Medicare Other | Source: Ambulatory Visit | Attending: Neurosurgery | Admitting: Neurosurgery

## 2021-09-14 DIAGNOSIS — Z79899 Other long term (current) drug therapy: Secondary | ICD-10-CM

## 2021-09-14 DIAGNOSIS — Z885 Allergy status to narcotic agent status: Secondary | ICD-10-CM | POA: Diagnosis not present

## 2021-09-14 DIAGNOSIS — T8501XA Breakdown (mechanical) of ventricular intracranial (communicating) shunt, initial encounter: Secondary | ICD-10-CM | POA: Diagnosis not present

## 2021-09-14 DIAGNOSIS — G930 Cerebral cysts: Secondary | ICD-10-CM | POA: Diagnosis present

## 2021-09-14 DIAGNOSIS — Z9049 Acquired absence of other specified parts of digestive tract: Secondary | ICD-10-CM | POA: Diagnosis not present

## 2021-09-14 DIAGNOSIS — Z83438 Family history of other disorder of lipoprotein metabolism and other lipidemia: Secondary | ICD-10-CM

## 2021-09-14 DIAGNOSIS — G911 Obstructive hydrocephalus: Secondary | ICD-10-CM | POA: Diagnosis not present

## 2021-09-14 DIAGNOSIS — E785 Hyperlipidemia, unspecified: Secondary | ICD-10-CM | POA: Diagnosis present

## 2021-09-14 DIAGNOSIS — Z888 Allergy status to other drugs, medicaments and biological substances status: Secondary | ICD-10-CM

## 2021-09-14 DIAGNOSIS — F17210 Nicotine dependence, cigarettes, uncomplicated: Secondary | ICD-10-CM | POA: Diagnosis not present

## 2021-09-14 DIAGNOSIS — G936 Cerebral edema: Secondary | ICD-10-CM | POA: Diagnosis not present

## 2021-09-14 DIAGNOSIS — Z91040 Latex allergy status: Secondary | ICD-10-CM

## 2021-09-14 DIAGNOSIS — I10 Essential (primary) hypertension: Secondary | ICD-10-CM | POA: Diagnosis present

## 2021-09-14 DIAGNOSIS — I700 Atherosclerosis of aorta: Secondary | ICD-10-CM | POA: Diagnosis present

## 2021-09-14 DIAGNOSIS — I615 Nontraumatic intracerebral hemorrhage, intraventricular: Secondary | ICD-10-CM | POA: Diagnosis not present

## 2021-09-14 DIAGNOSIS — Y752 Prosthetic and other implants, materials and neurological devices associated with adverse incidents: Secondary | ICD-10-CM | POA: Diagnosis present

## 2021-09-14 DIAGNOSIS — G9389 Other specified disorders of brain: Secondary | ICD-10-CM | POA: Diagnosis not present

## 2021-09-14 DIAGNOSIS — Z7982 Long term (current) use of aspirin: Secondary | ICD-10-CM

## 2021-09-14 DIAGNOSIS — Z20822 Contact with and (suspected) exposure to covid-19: Secondary | ICD-10-CM | POA: Diagnosis present

## 2021-09-14 DIAGNOSIS — S0990XA Unspecified injury of head, initial encounter: Secondary | ICD-10-CM | POA: Diagnosis not present

## 2021-09-14 DIAGNOSIS — T8509XA Other mechanical complication of ventricular intracranial (communicating) shunt, initial encounter: Secondary | ICD-10-CM

## 2021-09-14 DIAGNOSIS — Z87442 Personal history of urinary calculi: Secondary | ICD-10-CM | POA: Diagnosis not present

## 2021-09-14 HISTORY — PX: SHUNT REVISION: SHX343

## 2021-09-14 LAB — SARS CORONAVIRUS 2 BY RT PCR (HOSPITAL ORDER, PERFORMED IN ~~LOC~~ HOSPITAL LAB): SARS Coronavirus 2: NEGATIVE

## 2021-09-14 SURGERY — SHUNT REVISION
Anesthesia: General

## 2021-09-14 MED ORDER — ORAL CARE MOUTH RINSE: 15.0000 mL | Freq: Once | OROMUCOSAL | Status: AC

## 2021-09-14 MED ORDER — HEMOSTATIC AGENTS (NO CHARGE) OPTIME
TOPICAL | Status: DC | PRN
  Administered 2021-09-14: 1 via TOPICAL

## 2021-09-14 MED ORDER — DEXAMETHASONE SODIUM PHOSPHATE 10 MG/ML IJ SOLN
INTRAMUSCULAR | Status: AC
  Filled 2021-09-14: qty 1

## 2021-09-14 MED ORDER — DOCUSATE SODIUM 100 MG PO CAPS
100.0000 mg | ORAL_CAPSULE | Freq: Two times a day (BID) | ORAL | Status: DC
  Administered 2021-09-14 – 2021-09-15 (×2): 100 mg via ORAL
  Filled 2021-09-14 (×2): qty 1

## 2021-09-14 MED ORDER — PROPOFOL 10 MG/ML IV BOLUS
INTRAVENOUS | Status: DC | PRN
  Administered 2021-09-14: 200 mg via INTRAVENOUS

## 2021-09-14 MED ORDER — PROPOFOL 10 MG/ML IV BOLUS
INTRAVENOUS | Status: AC
  Filled 2021-09-14: qty 20

## 2021-09-14 MED ORDER — CEFAZOLIN SODIUM-DEXTROSE 2-4 GM/100ML-% IV SOLN
2.0000 g | INTRAVENOUS | Status: AC
  Administered 2021-09-14: 2 g via INTRAVENOUS
  Filled 2021-09-14: qty 100

## 2021-09-14 MED ORDER — THROMBIN 5000 UNITS EX SOLR
CUTANEOUS | Status: AC
  Filled 2021-09-14: qty 5000

## 2021-09-14 MED ORDER — ROSUVASTATIN CALCIUM 5 MG PO TABS
10.0000 mg | ORAL_TABLET | Freq: Every day | ORAL | Status: DC
  Administered 2021-09-14 – 2021-09-15 (×2): 10 mg via ORAL
  Filled 2021-09-14 (×2): qty 2

## 2021-09-14 MED ORDER — PROMETHAZINE HCL 12.5 MG PO TABS
12.5000 mg | ORAL_TABLET | ORAL | Status: DC | PRN
  Filled 2021-09-14: qty 2

## 2021-09-14 MED ORDER — LIDOCAINE 2% (20 MG/ML) 5 ML SYRINGE
INTRAMUSCULAR | Status: AC
  Filled 2021-09-14: qty 10

## 2021-09-14 MED ORDER — MORPHINE SULFATE (PF) 2 MG/ML IV SOLN
2.0000 mg | INTRAVENOUS | Status: DC | PRN
  Administered 2021-09-14: 4 mg via INTRAVENOUS
  Administered 2021-09-14: 2 mg via INTRAVENOUS
  Administered 2021-09-15 (×2): 4 mg via INTRAVENOUS
  Filled 2021-09-14: qty 2
  Filled 2021-09-14: qty 1
  Filled 2021-09-14 (×2): qty 2

## 2021-09-14 MED ORDER — ONDANSETRON HCL 4 MG/2ML IJ SOLN
INTRAMUSCULAR | Status: AC
  Filled 2021-09-14: qty 2

## 2021-09-14 MED ORDER — BACITRACIN ZINC 500 UNIT/GM EX OINT
TOPICAL_OINTMENT | CUTANEOUS | Status: AC
  Filled 2021-09-14: qty 28.35

## 2021-09-14 MED ORDER — FENTANYL CITRATE (PF) 250 MCG/5ML IJ SOLN
INTRAMUSCULAR | Status: AC
  Filled 2021-09-14: qty 5

## 2021-09-14 MED ORDER — FENTANYL CITRATE (PF) 100 MCG/2ML IJ SOLN
INTRAMUSCULAR | Status: DC | PRN
  Administered 2021-09-14: 100 ug via INTRAVENOUS
  Administered 2021-09-14 (×2): 50 ug via INTRAVENOUS

## 2021-09-14 MED ORDER — BUPIVACAINE-EPINEPHRINE 0.5% -1:200000 IJ SOLN
INTRAMUSCULAR | Status: AC
  Filled 2021-09-14: qty 1

## 2021-09-14 MED ORDER — PANTOPRAZOLE SODIUM 40 MG IV SOLR
40.0000 mg | Freq: Every day | INTRAVENOUS | Status: DC
  Administered 2021-09-14: 40 mg via INTRAVENOUS
  Filled 2021-09-14: qty 40

## 2021-09-14 MED ORDER — EPHEDRINE SULFATE-NACL 50-0.9 MG/10ML-% IV SOSY
PREFILLED_SYRINGE | INTRAVENOUS | Status: DC | PRN
  Administered 2021-09-14: 10 mg via INTRAVENOUS

## 2021-09-14 MED ORDER — ROCURONIUM BROMIDE 10 MG/ML (PF) SYRINGE
PREFILLED_SYRINGE | INTRAVENOUS | Status: AC
  Filled 2021-09-14: qty 10

## 2021-09-14 MED ORDER — BUPIVACAINE-EPINEPHRINE 0.5% -1:200000 IJ SOLN
INTRAMUSCULAR | Status: DC | PRN
  Administered 2021-09-14: 20 mL

## 2021-09-14 MED ORDER — ROCURONIUM BROMIDE 10 MG/ML (PF) SYRINGE
PREFILLED_SYRINGE | INTRAVENOUS | Status: DC | PRN
  Administered 2021-09-14: 60 mg via INTRAVENOUS
  Administered 2021-09-14: 20 mg via INTRAVENOUS

## 2021-09-14 MED ORDER — SODIUM CHLORIDE 0.9 % IV SOLN: INTRAVENOUS | Status: DC

## 2021-09-14 MED ORDER — CHLORHEXIDINE GLUCONATE CLOTH 2 % EX PADS: 6.0000 | MEDICATED_PAD | Freq: Once | CUTANEOUS | Status: DC

## 2021-09-14 MED ORDER — ONDANSETRON HCL 4 MG/2ML IJ SOLN
INTRAMUSCULAR | Status: DC | PRN
  Administered 2021-09-14: 4 mg via INTRAVENOUS

## 2021-09-14 MED ORDER — DEXAMETHASONE SODIUM PHOSPHATE 10 MG/ML IJ SOLN
INTRAMUSCULAR | Status: DC | PRN
  Administered 2021-09-14: 10 mg via INTRAVENOUS

## 2021-09-14 MED ORDER — LABETALOL HCL 5 MG/ML IV SOLN: 10.0000 mg | INTRAVENOUS | Status: DC | PRN

## 2021-09-14 MED ORDER — VANCOMYCIN HCL 1000 MG IV SOLR
INTRAVENOUS | Status: AC
  Filled 2021-09-14: qty 20

## 2021-09-14 MED ORDER — LIDOCAINE 2% (20 MG/ML) 5 ML SYRINGE
INTRAMUSCULAR | Status: DC | PRN
  Administered 2021-09-14: 50 mg via INTRAVENOUS

## 2021-09-14 MED ORDER — SUGAMMADEX SODIUM 500 MG/5ML IV SOLN
INTRAVENOUS | Status: AC
  Filled 2021-09-14: qty 5

## 2021-09-14 MED ORDER — 0.9 % SODIUM CHLORIDE (POUR BTL) OPTIME
TOPICAL | Status: DC | PRN
  Administered 2021-09-14: 1000 mL

## 2021-09-14 MED ORDER — EPHEDRINE 5 MG/ML INJ
INTRAVENOUS | Status: AC
  Filled 2021-09-14: qty 5

## 2021-09-14 MED ORDER — ONDANSETRON HCL 4 MG/2ML IJ SOLN: 4.0000 mg | Freq: Four times a day (QID) | INTRAMUSCULAR | Status: DC | PRN

## 2021-09-14 MED ORDER — LIDOCAINE-EPINEPHRINE 1 %-1:100000 IJ SOLN
INTRAMUSCULAR | Status: AC
  Filled 2021-09-14: qty 1

## 2021-09-14 MED ORDER — FENTANYL CITRATE (PF) 100 MCG/2ML IJ SOLN: 25.0000 ug | INTRAMUSCULAR | Status: DC | PRN

## 2021-09-14 MED ORDER — ONDANSETRON HCL 4 MG/2ML IJ SOLN: 4.0000 mg | INTRAMUSCULAR | Status: DC | PRN

## 2021-09-14 MED ORDER — LOSARTAN POTASSIUM 50 MG PO TABS
50.0000 mg | ORAL_TABLET | Freq: Every day | ORAL | Status: DC
  Administered 2021-09-14 – 2021-09-15 (×2): 50 mg via ORAL
  Filled 2021-09-14 (×2): qty 1

## 2021-09-14 MED ORDER — OXYCODONE HCL 5 MG/5ML PO SOLN: 5.0000 mg | Freq: Once | ORAL | Status: DC | PRN

## 2021-09-14 MED ORDER — OXYCODONE HCL 5 MG PO TABS: 5.0000 mg | ORAL_TABLET | Freq: Once | ORAL | Status: DC | PRN

## 2021-09-14 MED ORDER — CEFAZOLIN SODIUM-DEXTROSE 2-4 GM/100ML-% IV SOLN
2.0000 g | Freq: Three times a day (TID) | INTRAVENOUS | Status: AC
  Administered 2021-09-14 – 2021-09-15 (×2): 2 g via INTRAVENOUS
  Filled 2021-09-14 (×2): qty 100

## 2021-09-14 MED ORDER — CHLORHEXIDINE GLUCONATE 0.12 % MT SOLN
15.0000 mL | Freq: Once | OROMUCOSAL | Status: AC
  Administered 2021-09-14: 15 mL via OROMUCOSAL
  Filled 2021-09-14: qty 15

## 2021-09-14 MED ORDER — ACETAMINOPHEN 650 MG RE SUPP: 650.0000 mg | RECTAL | Status: DC | PRN

## 2021-09-14 MED ORDER — ALBUTEROL SULFATE (2.5 MG/3ML) 0.083% IN NEBU
2.5000 mg | INHALATION_SOLUTION | Freq: Four times a day (QID) | RESPIRATORY_TRACT | Status: DC
  Administered 2021-09-14: 2.5 mg via RESPIRATORY_TRACT
  Filled 2021-09-14 (×3): qty 3

## 2021-09-14 MED ORDER — AMLODIPINE BESYLATE 10 MG PO TABS
10.0000 mg | ORAL_TABLET | Freq: Every day | ORAL | Status: DC
  Administered 2021-09-14 – 2021-09-15 (×2): 10 mg via ORAL
  Filled 2021-09-14 (×2): qty 1

## 2021-09-14 MED ORDER — ACETAMINOPHEN 325 MG PO TABS: 650.0000 mg | ORAL_TABLET | ORAL | Status: DC | PRN

## 2021-09-14 MED ORDER — POTASSIUM CHLORIDE IN NACL 20-0.9 MEQ/L-% IV SOLN
INTRAVENOUS | Status: DC
  Filled 2021-09-14: qty 1000

## 2021-09-14 MED ORDER — SUGAMMADEX SODIUM 200 MG/2ML IV SOLN
INTRAVENOUS | Status: DC | PRN
  Administered 2021-09-14: 200 mg via INTRAVENOUS

## 2021-09-14 MED ORDER — THROMBIN 5000 UNITS EX SOLR
OROMUCOSAL | Status: DC | PRN
  Administered 2021-09-14: 5 mL via TOPICAL

## 2021-09-14 MED ORDER — ONDANSETRON HCL 4 MG PO TABS: 4.0000 mg | ORAL_TABLET | ORAL | Status: DC | PRN

## 2021-09-14 MED ORDER — HYDROCODONE-ACETAMINOPHEN 5-325 MG PO TABS
1.0000 | ORAL_TABLET | ORAL | Status: DC | PRN
  Administered 2021-09-14: 1 via ORAL
  Administered 2021-09-14 – 2021-09-15 (×3): 2 via ORAL
  Filled 2021-09-14: qty 2
  Filled 2021-09-14: qty 1
  Filled 2021-09-14 (×2): qty 2

## 2021-09-14 SURGICAL SUPPLY — 74 items
APL SKNCLS STERI-STRIP NONHPOA (GAUZE/BANDAGES/DRESSINGS) ×1
BAG COUNTER SPONGE SURGICOUNT (BAG) ×4 IMPLANT
BAG SPNG CNTER NS LX DISP (BAG) ×2
BENZOIN TINCTURE PRP APPL 2/3 (GAUZE/BANDAGES/DRESSINGS) ×2 IMPLANT
BLADE CLIPPER SURG (BLADE) ×3 IMPLANT
BLADE SURG 10 STRL SS (BLADE) IMPLANT
BLADE SURG 15 STRL LF DISP TIS (BLADE) IMPLANT
BLADE SURG 15 STRL SS (BLADE)
BOOT SUTURE AID YELLOW STND (SUTURE) ×2 IMPLANT
BUR ACORN 6.0 PRECISION (BURR) IMPLANT
CABLE BIPOLOR RESECTION CORD (MISCELLANEOUS) ×1 IMPLANT
CANISTER SUCT 3000ML PPV (MISCELLANEOUS) ×2 IMPLANT
CARTRIDGE OIL MAESTRO DRILL (MISCELLANEOUS) ×1 IMPLANT
CLIP RANEY DISP (INSTRUMENTS) IMPLANT
COVER MAYO STAND STRL (DRAPES) IMPLANT
DIFFUSER DRILL AIR PNEUMATIC (MISCELLANEOUS) ×1 IMPLANT
DRAPE HALF SHEET 40X57 (DRAPES) IMPLANT
DRAPE INCISE IOBAN 85X60 (DRAPES) ×4 IMPLANT
DRAPE ORTHO SPLIT 77X108 STRL (DRAPES) ×4
DRAPE SURG 17X23 STRL (DRAPES) ×6 IMPLANT
DRAPE SURG ORHT 6 SPLT 77X108 (DRAPES) ×1 IMPLANT
DRSG OPSITE POSTOP 4X6 (GAUZE/BANDAGES/DRESSINGS) ×4 IMPLANT
ELECT CAUTERY BLADE 6.4 (BLADE) ×1 IMPLANT
ELECT REM PT RETURN 9FT ADLT (ELECTROSURGICAL) ×2
ELECTRODE REM PT RTRN 9FT ADLT (ELECTROSURGICAL) ×1 IMPLANT
GAUZE 4X4 16PLY ~~LOC~~+RFID DBL (SPONGE) ×3 IMPLANT
GLOVE EXAM NITRILE XL STR (GLOVE) IMPLANT
GLOVE SURG ENC MOIS LTX SZ8 (GLOVE) ×2 IMPLANT
GLOVE SURG ENC MOIS LTX SZ8.5 (GLOVE) ×2 IMPLANT
GLOVE SURG EUDERMIC 8 LTX PF (GLOVE) ×1 IMPLANT
GLOVE SURG GAMMEX PI TX LF 6.5 (GLOVE) ×1 IMPLANT
GLOVE SURG NEOPR MICRO LF 8.5 (GLOVE) ×1 IMPLANT
GOWN STRL REUS W/ TWL LRG LVL3 (GOWN DISPOSABLE) IMPLANT
GOWN STRL REUS W/ TWL XL LVL3 (GOWN DISPOSABLE) IMPLANT
GOWN STRL REUS W/TWL LRG LVL3 (GOWN DISPOSABLE)
GOWN STRL REUS W/TWL XL LVL3 (GOWN DISPOSABLE)
KIT BASIN OR (CUSTOM PROCEDURE TRAY) ×2 IMPLANT
KIT TURNOVER KIT B (KITS) ×2 IMPLANT
NEEDLE HYPO 22GX1.5 SAFETY (NEEDLE) ×2 IMPLANT
NS IRRIG 1000ML POUR BTL (IV SOLUTION) ×2 IMPLANT
OIL CARTRIDGE MAESTRO DRILL (MISCELLANEOUS)
PACK EENT II TURBAN DRAPE (CUSTOM PROCEDURE TRAY) IMPLANT
PACK LAMINECTOMY NEURO (CUSTOM PROCEDURE TRAY) ×2 IMPLANT
PAD ARMBOARD 7.5X6 YLW CONV (MISCELLANEOUS) ×6 IMPLANT
PATTIES SURGICAL .5 X.5 (GAUZE/BANDAGES/DRESSINGS) IMPLANT
PATTIES SURGICAL 1X1 (DISPOSABLE) IMPLANT
PENCIL BUTTON HOLSTER BLD 10FT (ELECTRODE) ×1 IMPLANT
SHEATH PERITONEAL INTRO 46 (SHEATH) IMPLANT
SHEATH PERITONEAL INTRO 61 (SHEATH) IMPLANT
SPONGE SURGIFOAM ABS GEL SZ50 (HEMOSTASIS) ×1 IMPLANT
SPONGE T-LAP 4X18 ~~LOC~~+RFID (SPONGE) ×2 IMPLANT
STAPLER SKIN PROX WIDE 3.9 (STAPLE) ×2 IMPLANT
STRIP CLOSURE SKIN 1/2X4 (GAUZE/BANDAGES/DRESSINGS) ×2 IMPLANT
SUT BONE WAX W31G (SUTURE) IMPLANT
SUT ETHILON 3 0 PS 1 (SUTURE) IMPLANT
SUT NURALON 4 0 TR CR/8 (SUTURE) IMPLANT
SUT SILK 0 TIES 10X30 (SUTURE) IMPLANT
SUT SILK 2X60 BLK UNID (SUTURE) ×1 IMPLANT
SUT SILK 3 0 SH 30 (SUTURE) ×2 IMPLANT
SUT VIC AB 1 CT1 18XBRD ANBCTR (SUTURE) IMPLANT
SUT VIC AB 1 CT1 8-18 (SUTURE)
SUT VIC AB 2-0 CP2 18 (SUTURE) ×3 IMPLANT
SUT VIC AB 2-0 CT1 18 (SUTURE) ×1 IMPLANT
SUT VIC AB 3-0 SH 8-18 (SUTURE) ×1 IMPLANT
SYR 5ML LL (SYRINGE) ×1 IMPLANT
SYR BULB EAR ULCER 3OZ GRN STR (SYRINGE) ×1 IMPLANT
SYR CONTROL 10ML LL (SYRINGE) ×1 IMPLANT
TOWEL GREEN STERILE (TOWEL DISPOSABLE) ×2 IMPLANT
TOWEL GREEN STERILE FF (TOWEL DISPOSABLE) ×2 IMPLANT
TRAY FOLEY MTR SLVR 16FR STAT (SET/KITS/TRAYS/PACK) IMPLANT
TUBE CONNECTING 12X1/4 (SUCTIONS) ×1 IMPLANT
UNDERPAD 30X36 HEAVY ABSORB (UNDERPADS AND DIAPERS) ×2 IMPLANT
VALVE CERTAS PLUS SYSTEM (Valve) ×1 IMPLANT
WATER STERILE IRR 1000ML POUR (IV SOLUTION) ×2 IMPLANT

## 2021-09-14 NOTE — Anesthesia Preprocedure Evaluation (Signed)
Anesthesia Evaluation  Patient identified by MRN, date of birth, ID band Patient awake    Reviewed: Allergy & Precautions, H&P , NPO status , Patient's Chart, lab work & pertinent test results  Airway Mallampati: II   Neck ROM: full    Dental   Pulmonary Current Smoker,    breath sounds clear to auscultation       Cardiovascular hypertension, + CAD   Rhythm:regular Rate:Normal     Neuro/Psych PSYCHIATRIC DISORDERS Anxiety Depression    GI/Hepatic (+)     substance abuse  alcohol use,   Endo/Other    Renal/GU stones     Musculoskeletal   Abdominal   Peds  Hematology   Anesthesia Other Findings   Reproductive/Obstetrics                             Anesthesia Physical Anesthesia Plan  ASA: 3  Anesthesia Plan: General   Post-op Pain Management:    Induction: Intravenous  PONV Risk Score and Plan: 1 and Ondansetron, Dexamethasone, Treatment may vary due to age or medical condition and Midazolam  Airway Management Planned: Oral ETT  Additional Equipment:   Intra-op Plan:   Post-operative Plan: Extubation in OR  Informed Consent: I have reviewed the patients History and Physical, chart, labs and discussed the procedure including the risks, benefits and alternatives for the proposed anesthesia with the patient or authorized representative who has indicated his/her understanding and acceptance.     Dental advisory given  Plan Discussed with: CRNA, Anesthesiologist and Surgeon  Anesthesia Plan Comments:         Anesthesia Quick Evaluation

## 2021-09-14 NOTE — Op Note (Signed)
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  He did well until recently when he developed recurrent gait ataxia.  Head CT demonstrated recurrent obstructive hydrocephalus.  I discussed the situation with the patient and recommended surgery.  The patient has decided proceed with a shunt revision/replacement.  Pre-op diagnosis: Obstructive hydrocephalus, intracranial arachnoid cyst, ventriculoperitoneal shunt malfunction  Postop diagnosis: The same  Procedure: Removal of ventriculoperitoneal shunt; placement of right retroauricular ventriculoperitoneal shunt (Integra Certas MRI compatible adjustable shunt)  Surgeon: Dr. Earle Gell  Assistant: Arnetha Massy, NP  Anesthesia: General tracheal  Estimated blood loss: 75 cc  Specimens: None  Drains: None  Complications: None  Description of procedure: The patient was brought to the operating room by the anesthesia team.  General endotracheal anesthesia was induced.  The patient remained in the supine position.  A roll was placed on his right flank, his head was turned to the left exposing his right scalp.  His right scalp was then shaved with clippers and this region in his neck thorax and abdomen were prepared with Betadine scrub and Betadine solution.  Sterile drapes were applied.  I then injected the area to be incised with Marcaine with epinephrine solution.  I used the scalpel to incise through the patient's previous right retroauricular incision.  I used electrocautery to expose the old shunt valve.  I freed up the shunt valve with electrocautery.  We partially remove the shunt valve and I ligated the suture which was securing the ventricular catheter to the [place] reservoir.  I disconnected the ventricular catheter from the shunt valve.  There was flow of CSF fluid through the  ventricular catheter indicative of either a valve malfunction or distal malfunction.  We therefore decided to change the entire system.  I then remove the old ventricular catheter and the old shunt.  I then made an incision in the patient's right upper quadrant of his abdomen.  I dissected down to the anterior rectus sheath and divided it.  We then dissected through the rectus muscle and incised the posterior rectus sheath.  We dissected deeper and encountered some preperitoneal fat.  We then identified the peritoneum and incised it, entering the peritoneal cavity.  We identified the liver confirming we were in the peritoneal cavity.  I then used the shunt passer to pass the proximal catheter from the scalp incision down through the abdominal incision.  I connected the new peritoneal catheter to a new Certas valve which was set at 4.  I then cut a new ventricular catheter to approximately 8 cm.  I inserted into the ventricular system through the old bur hole and immediately encountered spinal fluid.  We threaded the catheter a bit deeper over the stylette and then remove the stylette.  There was good flow of CSF through the ventricular catheter we connected the ventricular catheter to the valve.  We secured the ventricular catheter and the peritoneal catheter to the valve with silk ties.  We then seated the valve into the subcutaneous pocket and patient's scalp and pulled out the slack from the distal catheter.  I could a few sideholes in the distal peritoneal catheter.  There was spontaneous flow of cerebrospinal fluid out of the distal catheter.  I then inserted the peritoneal catheter into the perineum.  We then reapproximated the patient's anterior rectus sheath with interrupted 2-0 Vicryl suture.  We reapproximated patient's subcutaneous tissue with interrupted 2-0 Vicryl suture.  We reapproximated the abdominal skin with stainless steel staples.  I secured the valve to the patient's periosteum with a  figure-of-eight 2-0 Vicryl suture.  We then remove the retractor and reapproximated the patient's galea with interrupted 2-0 Vicryl suture.  We reapproximated the skin with stainless steel staples.  The wounds were then coated with bacitracin ointment.  Sterile dressings were applied.  The drapes were removed.  By report all sponge, instrument, and needle counts were correct at the end of this case.

## 2021-09-14 NOTE — H&P (Signed)
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  He did well until recently when he developed recurrent ataxia and repeated falling.  He was worked up with a head CT which demonstrated recurrent obstructive hydrocephalus.  I discussed the situation with the patient.  He has decided proceed with surgery.  Past Medical History:  Diagnosis Date   Alcohol abuse    Anxiety    denies   Aortic atherosclerosis (Early)    Coronary artery calcification seen on CT scan    Depression    denies   Diverticulitis 04/2019   History of kidney stones    History of prediabetes    Hyperlipidemia    Hypertension    Polycythemia    Polycythemia    RBBB    Tobacco use     Past Surgical History:  Procedure Laterality Date   CHOLECYSTECTOMY N/A 05/06/2019   Procedure: LAPAROSCOPIC CHOLECYSTECTOMY WITH INTRAOPERATIVE CHOLANGIOGRAM;  Surgeon: Donnie Mesa, MD;  Location: [facility];  Service: General;  Laterality: N/A;   COLONOSCOPY     CYSTOSCOPY WITH RETROGRADE PYELOGRAM, URETEROSCOPY AND STENT PLACEMENT Right 01/15/2015   Procedure: CYSTOSCOPY, RIGHT URETEROSCOPY/ RETROGRADE PYELOGRAM,HOLMIUM LASER/ LITHOTRIPSY, STENT PLACEMENT, URETERAL BALLOON DIALATION;  Surgeon: Kathie Rhodes, MD;  Location: [facility];  Service: Urology;  Laterality: Right;   INGUINAL HERNIA REPAIR Bilateral 08/05/2019   Procedure: OPEN BILATERAL INGUINAL HERNIA REPAIRS WITH MESH;  Surgeon: Donnie Mesa, MD;  Location: [facility];  Service: General;  Laterality: Bilateral;   INSERTION OF MESH Bilateral 08/05/2019   Procedure: INSERTION OF MESH;  Surgeon: Donnie Mesa, MD;  Location: [facility];  Service: General;  Laterality: Bilateral;   SKIN GRAFT     left hand   VENTRICULOPERITONEAL SHUNT Right 09/04/2016   Procedure: SHUNT INSERTION VENTRICULAR-PERITONEAL;  Surgeon:  Newman Pies, MD;  Location: [facility];  Service: Neurosurgery;  Laterality: Right;   WISDOM TOOTH EXTRACTION      Allergies  Allergen Reactions   Latex Rash    Itch   Codeine Itching   Lisinopril Cough    Social History   Tobacco Use   Smoking status: Every Day    Packs/day: 1.00    Years: 28.00    Pack years: 28.00    Types: Cigarettes   Smokeless tobacco: Never  Substance Use Topics   Alcohol use: Not Currently    Alcohol/week: 6.0 standard drinks    Types: 6 Standard drinks or equivalent per week    Comment: distilled spirits    Family History  Problem Relation Age of Onset   Hyperlipidemia Father    Colon cancer Neg Hx    Esophageal cancer Neg Hx    Stomach cancer Neg Hx    Pancreatic cancer Neg Hx    Prior to Admission medications   Medication Sig Start Date End Date Taking? Authorizing Provider  amLODipine (NORVASC) 10 MG tablet Take 10 mg by mouth daily.   Yes [provider]  aspirin EC 81 MG tablet Take 81 mg by mouth daily. Swallow whole.   Yes [provider]  losartan (COZAAR) 50 MG tablet Take 1 tablet (50 mg total) by mouth daily. 02/23/21  Yes Freada Bergeron, MD  rosuvastatin (CRESTOR) 10 MG tablet Take 1 tablet (10 mg total) by mouth daily. 02/23/21  Yes Freada Bergeron, MD  albuterol (PROVENTIL) (2.5 MG/3ML) 0.083% nebulizer solution Take 2.5 mg  by nebulization every 6 (six) hours. Patient not taking: Reported on 09/09/2021 05/26/21   [provider]  albuterol (VENTOLIN HFA) 108 (90 Base) MCG/ACT inhaler SMARTSIG:1 Puff(s) By Mouth Every 4 Hours PRN Patient not taking: Reported on 09/09/2021 05/26/21   [provider]  ondansetron (ZOFRAN ODT) 4 MG disintegrating tablet Take 1 tablet (4 mg total) by mouth every 8 (eight) hours as needed for nausea or vomiting. Patient not taking: Reported on 09/09/2021 03/08/20   Deno Etienne, DO     Review of Systems  Positive ROS: As above  All other systems have been  reviewed and were otherwise negative with the exception of those mentioned in the HPI and as above.  Objective: Vital signs in last 24 hours: Temp:  [97.5 F (36.4 C)] 97.5 F (36.4 C) (11/30 1016) Pulse Rate:  [85] 85 (11/30 1016) Resp:  [18] 18 (11/30 1016) BP: (144)/(60) 144/60 (11/30 1016) SpO2:  [100 %] 100 % (11/30 1016) Weight:  [81.6 kg] 81.6 kg (11/30 1016) Estimated body mass index is 25.1 kg/m as calculated from the following:   Height as of this encounter: 5\' 11"  (1.803 m).   Weight as of this encounter: 81.6 kg.   General Appearance: Alert Head: Normocephalic, without obvious abnormality, atraumatic Eyes: PERRL, conjunctiva/corneas clear, EOM's intact,    Ears: Normal  Throat: Normal  Neck: Supple, Back: unremarkable Lungs: Clear to auscultation bilaterally, respirations unlabored Heart: Regular rate and rhythm, no murmur, rub or gallop Abdomen: Soft, non-tender Extremities: Extremities normal, atraumatic, no cyanosis or edema Skin: unremarkable  NEUROLOGIC:   Mental status: alert and oriented,Motor Exam - grossly normal Sensory Exam - grossly normal Reflexes:  Coordination - grossly normal Gait -unsteady Balance - grossly normal Cranial Nerves: I: smell Not tested  II: visual acuity  OS: Normal  OD: Normal   II: visual fields Full to confrontation  II: pupils Equal, round, reactive to light  III,VII: ptosis None  III,IV,VI: extraocular muscles  Full ROM  V: mastication Normal  V: facial light touch sensation  Normal  V,VII: corneal reflex  Present  VII: facial muscle function - upper  Normal  VII: facial muscle function - lower Normal  VIII: hearing Not tested  IX: soft palate elevation  Normal  IX,X: gag reflex Present  XI: trapezius strength  5/5  XI: sternocleidomastoid strength 5/5  XI: neck flexion strength  5/5  XII: tongue strength  Normal    Data Review Lab Results  Component Value Date   WBC 9.5 09/09/2021   HGB 17.7 (H)  09/09/2021   HCT 53.7 (H) 09/09/2021   MCV 93.4 09/09/2021   PLT 318 09/09/2021   Lab Results  Component Value Date   NA 134 (L) 09/09/2021   K 4.1 09/09/2021   CL 104 09/09/2021   CO2 19 (L) 09/09/2021   BUN 11 09/09/2021   CREATININE 0.97 09/09/2021   GLUCOSE 97 09/09/2021   Lab Results  Component Value Date   INR 1.03 09/04/2016    Assessment/Plan: Obstructive hydrocephalus, intracranial arachnoid cysts, ventriculoperitoneal shunt malfunction: I have discussed the situation with the patient.  I reviewed his imaging studies with him and pointed out the abnormalities.  We have discussed the various treatment options.  I recommended a revision/replacement of his ventriculoperitoneal shunt.  I described the surgery to him.  We have discussed the risk, benefits, alternatives, expected postop course, and likelihood of achieving our goals with surgery.  I have answered all his questions.  He has decided  to proceed with surgery.   Ophelia Charter 09/14/2021 2:06 PM

## 2021-09-14 NOTE — Transfer of Care (Signed)
Immediate Anesthesia Transfer of Care Note  Patient: Luis Henderson  Procedure(s) Performed: SHUNT REVISION  Patient Location: PACU  Anesthesia Type:General  Level of Consciousness: awake and oriented  Airway & Oxygen Therapy: Patient Spontanous Breathing  Post-op Assessment: Report given to RN and Patient moving all extremities X 4  Post vital signs: Reviewed and stable  Last Vitals:  Vitals Value Taken Time  BP 183/86 09/14/21 1654  Temp    Pulse 97 09/14/21 1656  Resp 17 09/14/21 1656  SpO2 99 % 09/14/21 1656  Vitals shown include unvalidated device data.  Last Pain:  Vitals:   09/14/21 1210  TempSrc:   PainSc: 0-No pain         Complications: No notable events documented.

## 2021-09-14 NOTE — Anesthesia Procedure Notes (Signed)
Procedure Name: Intubation Date/Time: 09/14/2021 3:11 PM Performed by: Barrington Ellison, CRNA Pre-anesthesia Checklist: Patient identified, Emergency Drugs available, Suction available and Patient being monitored Patient Re-evaluated:Patient Re-evaluated prior to induction Oxygen Delivery Method: Circle System Utilized Preoxygenation: Pre-oxygenation with 100% oxygen Induction Type: IV induction Ventilation: Mask ventilation without difficulty Laryngoscope Size: Mac and 4 Grade View: Grade I Tube type: Oral Tube size: 7.5 mm Number of attempts: 1 Airway Equipment and Method: Stylet and Oral airway Placement Confirmation: ETT inserted through vocal cords under direct vision, positive ETCO2 and breath sounds checked- equal and bilateral Secured at: 22 cm Tube secured with: Tape Dental Injury: Teeth and Oropharynx as per pre-operative assessment

## 2021-09-14 NOTE — Discharge Summary (Signed)
Physician Discharge Summary  Patient ID: Luis Henderson MRN: [id number] DOB/AGE: Jun 29, 1956 65 y.o.  Admit date: 09/09/2021 Discharge date: 09/14/2021  Admission Diagnoses: Suspected shunt malfunction   Discharge Diagnoses: Same, patient left the hospital [facility]   Discharged Condition: stable  Hospital Course: The patient was admitted on 09/09/2021 by another physician with suspected shunt malfunction.  The plan was for shunt revision this week with Dr. Arnoldo Morale as the patient was awake and alert and was on aspirin and we were holding the aspirin.  The patient did not want to be in the hospital and wanted to "go home and eat my own food, take care of my cats, and smoke a cigarette."  Therefore he checked out [place]: None  Significant Diagnostic Studies:  Results for orders placed or performed during the hospital encounter of 09/09/21  HIV Antibody (routine testing w rflx)  Result Value Ref Range   HIV Screen 4th Generation wRfx Non Reactive Non Reactive  Basic metabolic panel  Result Value Ref Range   Sodium 134 (L) 135 - 145 mmol/L   Potassium 4.1 3.5 - 5.1 mmol/L   Chloride 104 98 - 111 mmol/L   CO2 19 (L) 22 - 32 mmol/L   Glucose, Bld 97 70 - 99 mg/dL   BUN 11 8 - 23 mg/dL   Creatinine, Ser 0.97 0.61 - 1.24 mg/dL   Calcium 9.5 8.9 - 10.3 mg/dL   GFR, Estimated >60 >60 mL/min   Anion gap 11 5 - 15  CBC  Result Value Ref Range   WBC 9.5 4.0 - 10.5 K/uL   RBC 5.75 4.22 - 5.81 MIL/uL   Hemoglobin 17.7 (H) 13.0 - 17.0 g/dL   HCT 53.7 (H) 39.0 - 52.0 %   MCV 93.4 80.0 - 100.0 fL   MCH 30.8 26.0 - 34.0 pg   MCHC 33.0 30.0 - 36.0 g/dL   RDW 13.1 11.5 - 15.5 %   Platelets 318 150 - 400 K/uL   nRBC 0.0 0.0 - 0.2 %    DG Skull 1-3 Views  Result Date: 09/09/2021 CLINICAL DATA:  Patient fell into a sliding glass door and hit head on metal frame of door. Notable laceration on top of head and patient is now experiencing headaches.  EXAM: SKULL - 1-3 VIEW COMPARISON:  05/31/2017 FINDINGS: No fracture or bone lesion. Stable right parietal ventriculostomy catheter. Sinuses are clear. IMPRESSION: No fracture or acute finding. Electronically Signed   By: Lajean Manes M.D.   On: 09/09/2021 12:46   DG Chest 1 View  Result Date: 09/09/2021 CLINICAL DATA:  Evaluate for shunt malfunction EXAM: CHEST  1 VIEW COMPARISON:  Chest radiograph 07/27/2021 FINDINGS: VP shunt tubing is seen coursing over the right hemithorax. There is no fracture or kink in the tubing to the level imaged. The cardiomediastinal silhouette is normal. There is no focal consolidation or pulmonary edema. There is no pleural effusion or pneumothorax. There is no acute osseous abnormality. IMPRESSION: The portion of the VP shunt tubing coursing over the right hemithorax is intact, without fracture or kink. Electronically Signed   By: Valetta Mole M.D.   On: 09/09/2021 12:49   DG Cervical Spine 1 View  Result Date: 09/09/2021 CLINICAL DATA:  Patient fell into a sliding glass door and hit head on metal frame of door. Notable laceration on top of head and patient is now experiencing headaches. EXAM: DG CERVICAL SPINE - 1 VIEW COMPARISON:  05/31/2017 FINDINGS: Single AP  view of the cervical spine and neck. No convincing fracture or bone lesion. Degenerative changes are noted of the cervical spine, stable. Carotid artery vascular calcifications are stable. Right-sided ventriculostomy catheter extends across the neck and right anterior chest, intact and stable. IMPRESSION: 1. No acute findings. 2. Intact visualized portion of the ventriculoperitoneal catheter. Electronically Signed   By: Lajean Manes M.D.   On: 09/09/2021 12:48   DG Abd 1 View  Result Date: 09/09/2021 CLINICAL DATA:  Shunt malfunction. EXAM: ABDOMEN - 1 VIEW COMPARISON:  May 03, 2019. FINDINGS: The bowel gas pattern is normal. Stable right renal calculus. Status post cholecystectomy. Visualized portion of  right ventriculoperitoneal shunt appears intact. IMPRESSION: Visualized portion of right ventriculoperitoneal shunt appears intact. Stable right renal calculus. Electronically Signed   By: Marijo Conception M.D.   On: 09/09/2021 12:47   CT HEAD WO CONTRAST (5MM)  Result Date: 09/09/2021 CLINICAL DATA:  Head trauma EXAM: CT HEAD WITHOUT CONTRAST TECHNIQUE: Contiguous axial images were obtained from the base of the skull through the vertex without intravenous contrast. COMPARISON:  CT head 06/18/2019 FINDINGS: Brain: No acute intracranial hemorrhage, mass effect, or midline shift. No extra-axial fluid collections. No evidence of an acute territorial infarct. Stable position of a right parietal approach ventriculostomy shunt catheter with the tip in the left lateral ventricle. There is mild hydrocephalus with diffuse increased size of the ventricles since previous study when the ventricles were slit-like. Stable to slightly increased size of a rounded CSF attenuation structure in the aqueductal region measuring 2.5 x 2.3 cm, with mass effect on the dorsal aspect of the midbrain. Patchy periventricular hypodensities which are increased since previous study and may represent subependymal flow of CSF. Similar patchy hypodensities surrounding the shunt in the right parietal lobe. Vascular: Mild calcified plaques in the carotid siphons. Skull: No acute fracture identified. Sinuses/Orbits: No acute finding. Other: None. IMPRESSION: 1. Right parietal approach ventriculostomy shunt catheter with the tip in the left lateral ventricle. Interval development of mild hydrocephalus and patchy periventricular hypodensities suggesting subependymal flow of CSF. Correlate clinically and consider follow-up shunt series and neurosurgical consultation. 2. Stable to slightly increased size of a chronic CSF attenuation cystic structure dorsal to the midbrain. 3. No acute intracranial hemorrhage. Electronically Signed   By: Ofilia Neas M.D.   On: 09/09/2021 12:59    Antibiotics:  Anti-infectives (From admission, onward)    None       Discharge Exam: Blood pressure (!) 160/81, pulse 72, temperature 98.3 F (36.8 C), temperature source Oral, resp. rate 18, height 5\' 11"  (1.803 m), weight 81.6 kg, SpO2 98 %. Neurologic: Grossly normal   Discharge Medications:   Allergies as of 09/10/2021       Reactions   Codeine Itching   Lisinopril Cough        Medication List     ASK your doctor about these medications    albuterol (2.5 MG/3ML) 0.083% nebulizer solution Commonly known as: PROVENTIL Take 2.5 mg by nebulization every 6 (six) hours.   albuterol 108 (90 Base) MCG/ACT inhaler Commonly known as: VENTOLIN HFA SMARTSIG:1 Puff(s) By Mouth Every 4 Hours PRN   amLODipine 10 MG tablet Commonly known as: NORVASC Take 10 mg by mouth daily.   aspirin EC 81 MG tablet Take 81 mg by mouth daily. Swallow whole.   losartan 50 MG tablet Commonly known as: COZAAR Take 1 tablet (50 mg total) by mouth daily.   ondansetron 4 MG disintegrating tablet Commonly known as:  Zofran ODT Take 1 tablet (4 mg total) by mouth every 8 (eight) hours as needed for nausea or vomiting.   rosuvastatin 10 MG tablet Commonly known as: CRESTOR Take 1 tablet (10 mg total) by mouth daily.        Disposition: Home AGAINST MEDICAL ADVICE   Final Dx: Suspected shunt malfunction     Follow-up Information     Seward Carol, MD.   Specialty: Internal Medicine Contact information: [address]  [place] [address] [phone]         Go to  [facility].   Specialty: Emergency Medicine Why: As needed, If symptoms worsen Contact information: [address] [id number] [place] [phone]                 Signed: Eustace Moore 09/14/2021, 4:10 PM

## 2021-09-14 NOTE — Plan of Care (Signed)
  Problem: Clinical Measurements: Goal: Will remain free from infection Outcome: Progressing   Problem: Safety: Goal: Ability to remain free from injury will improve Outcome: Progressing   

## 2021-09-15 ENCOUNTER — Institutional Professional Consult (permissible substitution): Payer: Medicare Other | Admitting: Internal Medicine

## 2021-09-15 ENCOUNTER — Encounter (HOSPITAL_COMMUNITY): Payer: Self-pay | Admitting: Neurosurgery

## 2021-09-15 ENCOUNTER — Inpatient Hospital Stay (HOSPITAL_COMMUNITY): Payer: Medicare Other

## 2021-09-15 MED ORDER — ALBUTEROL SULFATE (2.5 MG/3ML) 0.083% IN NEBU: 2.5000 mg | INHALATION_SOLUTION | Freq: Four times a day (QID) | RESPIRATORY_TRACT | Status: DC | PRN

## 2021-09-15 NOTE — Plan of Care (Signed)

## 2021-09-15 NOTE — Discharge Instructions (Signed)
Wound Care Keep incision covered and dry for two days.    Do not put any creams, lotions, or ointments on incision. Leave staples. They will be removed at your first post op visit.  Activity Walk each and every day, increasing distance each day. No driving for 2 weeks; may ride as a passenger locally.  Diet Resume your normal diet.   Return to Work Will be discussed at your follow up appointment.  Call Your Doctor If Any of These Occur Redness, drainage, or swelling at the wound.  Temperature greater than 101 degrees. Severe pain not relieved by pain medication. Incision starts to come apart.  Follow Up Appt Call today for appointment in 10 days [phone]) or for problems.

## 2021-09-15 NOTE — Discharge Summary (Signed)
Physician Discharge Summary  Patient ID: Luis Henderson MRN: [id number] DOB/AGE: December 26, 1955 65 y.o.  Admit date: 09/14/2021 Discharge date: 09/15/2021  Admission Diagnoses: Obstructive hydrocephalus, ventriculoperitoneal shunt malfunction  Discharge Diagnoses: The same Principal Problem:   Malfunction of ventriculoperitoneal shunt [facility])   Discharged Condition: good  Hospital Course: I replaced the patient's ventriculoperitoneal shunt with a Certas programmable MRI compatible shunt on 09/14/2021.  The surgery went well.  The patient's postoperative course was unremarkable.  A follow-up scan was obtained on 09/15/2021 which demonstrated decompression of the ventricles.  The patient felt well and requested discharge to home.  He was discharged.  He was given written and verbal discharge instructions.  All his questions were answered.  Consults: PT, OT, care management Significant Diagnostic Studies: Head CT Treatments: Placement of ventriculoperitoneal shunt Discharge Exam: Blood pressure (!) 145/68, pulse 83, temperature 98 F (36.7 C), temperature source Oral, resp. rate 16, height 5\' 11"  (1.803 m), weight 81.6 kg, SpO2 98 %. The patient is alert and pleasant.  He looks well.  His dressings are clean and dry.  Disposition: Home  Discharge Instructions     Call MD for:  difficulty breathing, headache or visual disturbances   Complete by: As directed    Call MD for:  extreme fatigue   Complete by: As directed    Call MD for:  hives   Complete by: As directed    Call MD for:  persistant dizziness or light-headedness   Complete by: As directed    Call MD for:  persistant nausea and vomiting   Complete by: As directed    Call MD for:  redness, tenderness, or signs of infection (pain, swelling, redness, odor or green/yellow discharge around incision site)   Complete by: As directed    Call MD for:  severe uncontrolled pain   Complete by: As directed    Call MD for:   temperature >100.4   Complete by: As directed    Diet - low sodium heart healthy   Complete by: As directed    Discharge instructions   Complete by: As directed    Call [phone] for a followup appointment. Take a stool softener while you are using pain medications.   Driving Restrictions   Complete by: As directed    Do not drive for 2 weeks.   Increase activity slowly   Complete by: As directed    Lifting restrictions   Complete by: As directed    Do not lift more than 5 pounds. No excessive bending or twisting.   May shower / Bathe   Complete by: As directed    Remove the dressing for 3 days after surgery.  You may shower, but leave the incision alone.   Remove dressing in 48 hours   Complete by: As directed       Allergies as of 09/15/2021       Reactions   Latex Rash   Itch   Codeine Itching   Lisinopril Cough        Medication List     TAKE these medications    albuterol (2.5 MG/3ML) 0.083% nebulizer solution Commonly known as: PROVENTIL Take 2.5 mg by nebulization every 6 (six) hours.   albuterol 108 (90 Base) MCG/ACT inhaler Commonly known as: VENTOLIN HFA SMARTSIG:1 Puff(s) By Mouth Every 4 Hours PRN   amLODipine 10 MG tablet Commonly known as: NORVASC Take 10 mg by mouth daily.   aspirin EC 81 MG tablet Take 81 mg by mouth  daily. Swallow whole.   losartan 50 MG tablet Commonly known as: COZAAR Take 1 tablet (50 mg total) by mouth daily.   ondansetron 4 MG disintegrating tablet Commonly known as: Zofran ODT Take 1 tablet (4 mg total) by mouth every 8 (eight) hours as needed for nausea or vomiting.   rosuvastatin 10 MG tablet Commonly known as: CRESTOR Take 1 tablet (10 mg total) by mouth daily.         Signed: Ophelia Charter 09/15/2021, 8:19 AM

## 2021-09-15 NOTE — Progress Notes (Signed)
Patient discharged home via transport service, discharge instructions reviewed and given to patient.

## 2021-09-16 NOTE — Anesthesia Postprocedure Evaluation (Signed)
Anesthesia Post Note  Patient: Luis Henderson  Procedure(s) Performed: SHUNT REVISION     Patient location during evaluation: PACU Anesthesia Type: General Level of consciousness: awake and alert Pain management: pain level controlled Vital Signs Assessment: post-procedure vital signs reviewed and stable Respiratory status: spontaneous breathing, nonlabored ventilation, respiratory function stable and patient connected to nasal cannula oxygen Cardiovascular status: blood pressure returned to baseline and stable Postop Assessment: no apparent nausea or vomiting Anesthetic complications: no   No notable events documented.  Last Vitals:  Vitals:   09/15/21 0830 09/15/21 1100  BP: 128/70 124/75  Pulse: (!) 53 64  Resp: 18 20  Temp: 36.7 C 36.8 C  SpO2: 99% 98%    Last Pain:  Vitals:   09/15/21 1100  TempSrc: Oral  PainSc:    Pain Goal: Patients Stated Pain Goal: 2 (09/15/21 0910)                 Jovonna Nickell S

## 2021-09-22 DIAGNOSIS — Z982 Presence of cerebrospinal fluid drainage device: Secondary | ICD-10-CM | POA: Diagnosis not present

## 2021-09-22 DIAGNOSIS — I10 Essential (primary) hypertension: Secondary | ICD-10-CM | POA: Diagnosis not present

## 2021-09-27 ENCOUNTER — Other Ambulatory Visit: Payer: Self-pay | Admitting: Student

## 2021-09-27 ENCOUNTER — Other Ambulatory Visit (HOSPITAL_COMMUNITY): Payer: Self-pay | Admitting: Student

## 2021-09-27 DIAGNOSIS — G911 Obstructive hydrocephalus: Secondary | ICD-10-CM

## 2021-09-29 ENCOUNTER — Ambulatory Visit (INDEPENDENT_AMBULATORY_CARE_PROVIDER_SITE_OTHER): Payer: Medicare Other | Admitting: Internal Medicine

## 2021-09-29 ENCOUNTER — Other Ambulatory Visit: Payer: Self-pay

## 2021-09-29 ENCOUNTER — Encounter: Payer: Self-pay | Admitting: Internal Medicine

## 2021-09-29 VITALS — BP 128/72 | HR 82 | Ht 71.0 in | Wt 185.0 lb

## 2021-09-29 DIAGNOSIS — F17200 Nicotine dependence, unspecified, uncomplicated: Secondary | ICD-10-CM

## 2021-09-29 DIAGNOSIS — Z716 Tobacco abuse counseling: Secondary | ICD-10-CM | POA: Diagnosis not present

## 2021-09-29 MED ORDER — BUPROPION HCL ER (SR) 150 MG PO TB12: 150.0000 mg | ORAL_TABLET | Freq: Two times a day (BID) | ORAL | 5 refills | Status: AC

## 2021-09-29 NOTE — Progress Notes (Signed)
Luis Henderson    [id number]    12/09/1955  Primary Care Physician:Polite, Jori Moll, MD  Referring Physician: Charlie Pitter, PA-C [address] [place],  [place] [address] Reason for Consultation: possible COPD Date of Consultation: 09/29/2021  Chief complaint:   Chief Complaint  Patient presents with   Consult    Referred by cardiologist for possible COPD. States emphysema was found on recent CT. Denies any COPD symptoms.      HPI:  Luis Henderson is a 65 y.o. man who presents for new patient evaluation of COPD.  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   Denies daily dyspnea, chest tightness, wheezing, coughing.    Had a very remote episode of bronchitis many years ago, nothing in the last few years.   Was diagnosed with asthma as a child has no memories of this.   He has a nebulizer which he inherited from his mother who used it at the end of life. Uses about once a month for chest tightness/pressure.   No limitation of ADLs due to dyspnea or respiratory symptoms.   Social history:  Occupation: previously working at [employer] working 5-8 miles/day - quit 2 months ago due to interpersonal issues, not because of breathing.  Exposures: lives at home with cats Smoking history: 1 ppd x 30 years = 30 pack years. Quit for 9 years when daughter was born. Used wellbutrin recently tried chantix and developed a distaste for cigarettes but was having side effects.   Social History   Occupational History    Employer: [id number]    CommentPsychiatrist  Tobacco Use   Smoking status: Every Day    Packs/day: 1.00    Years: 28.00    Pack years: 28.00    Types: Cigarettes   Smokeless tobacco: Never  Vaping Use   Vaping Use: Never used  Substance and Sexual Activity   Alcohol use: Not Currently    Alcohol/week: 6.0 standard drinks    Types: 6 Standard drinks or equivalent per week    Comment:  distilled spirits   Drug use: Yes    Types: Marijuana    Comment: 09/13/21- none in 3- 4 weeks   Sexual activity: Not Currently    Relevant family history:  Family History  Problem Relation Age of Onset   Hyperlipidemia Father    Colon cancer Neg Hx    Esophageal cancer Neg Hx    Stomach cancer Neg Hx    Pancreatic cancer Neg Hx    Lung disease Neg Hx     Past Medical History:  Diagnosis Date   Alcohol abuse    Anxiety    denies   Aortic atherosclerosis ([place])    Coronary artery calcification seen on CT scan    Depression    denies   Diverticulitis 04/2019   History of kidney stones    History of prediabetes    Hyperlipidemia    Hypertension    Polycythemia    Polycythemia    RBBB    Tobacco use     Past Surgical History:  Procedure Laterality Date   CHOLECYSTECTOMY N/A 05/06/2019   Procedure: LAPAROSCOPIC CHOLECYSTECTOMY WITH INTRAOPERATIVE CHOLANGIOGRAM;  Surgeon: Donnie Mesa, MD;  Location: [facility];  Service: General;  Laterality: N/A;   COLONOSCOPY     CYSTOSCOPY WITH RETROGRADE PYELOGRAM, URETEROSCOPY AND STENT PLACEMENT Right 01/15/2015   Procedure: CYSTOSCOPY,  RIGHT URETEROSCOPY/ RETROGRADE PYELOGRAM,HOLMIUM LASER/ LITHOTRIPSY, STENT PLACEMENT, URETERAL BALLOON DIALATION;  Surgeon: Kathie Rhodes, MD;  Location: [facility];  Service: Urology;  Laterality: Right;   INGUINAL HERNIA REPAIR Bilateral 08/05/2019   Procedure: OPEN BILATERAL INGUINAL HERNIA REPAIRS WITH MESH;  Surgeon: Donnie Mesa, MD;  Location: [facility];  Service: General;  Laterality: Bilateral;   INSERTION OF MESH Bilateral 08/05/2019   Procedure: INSERTION OF MESH;  Surgeon: Donnie Mesa, MD;  Location: [facility];  Service: General;  Laterality: Bilateral;   SHUNT REVISION N/A 09/14/2021   Procedure: SHUNT REVISION;  Surgeon: Newman Pies, MD;  Location: [facility];  Service: Neurosurgery;  Laterality: N/A;   SKIN GRAFT     left hand   VENTRICULOPERITONEAL SHUNT  Right 09/04/2016   Procedure: SHUNT INSERTION VENTRICULAR-PERITONEAL;  Surgeon: Newman Pies, MD;  Location: [facility];  Service: Neurosurgery;  Laterality: Right;   WISDOM TOOTH EXTRACTION       Physical Exam: Blood pressure 128/72, pulse 82, height 5\' 11"  (1.803 m), weight 185 lb (83.9 kg), SpO2 97 %. Gen:      No acute distress ENT:  no nasal polyps, mucus membranes moist Lungs:    No increased respiratory effort, symmetric chest wall excursion, clear to auscultation bilaterally, no wheezes or crackles CV:         Regular rate and rhythm; no murmurs, rubs, or gallops.  No pedal edema Abd:      + bowel sounds; soft, non-tender; no distension MSK: no acute synovitis of DIP or PIP joints, no mechanics hands.  Skin:      Warm and dry; no rashes Neuro: normal speech, no focal facial asymmetry Psych: alert and oriented x3, normal mood and affect   Data Reviewed/Medical Decision Making:  Independent interpretation of tests: Imaging:  Review of patient's CT Chest LDCT images March 2022 revealed mild peribronchial thickening and emphysema. The patient's images have been independently reviewed by me.    PFTs:  No flowsheet data found.  Labs:  Lab Results  Component Value Date   WBC 9.5 09/09/2021   HGB 17.7 (H) 09/09/2021   HCT 53.7 (H) 09/09/2021   MCV 93.4 09/09/2021   PLT 318 09/09/2021   Lab Results  Component Value Date   NA 134 (L) 09/09/2021   K 4.1 09/09/2021   CL 104 09/09/2021   CO2 19 (L) 09/09/2021     Immunization status:  Immunization History  Administered Date(s) Administered   Influenza Whole 07/09/2011   Influenza,inj,Quad PF,6+ Mos 07/14/2015, 06/09/2016, 06/28/2019   Influenza-Unspecified 06/16/2014, 06/18/2019   Janssen (J&J) SARS-COV-2 Vaccination 01/03/2020   Pneumococcal Conjugate-13 04/29/2020   Pneumococcal Polysaccharide-23 08/02/2015   Zoster Recombinat (Shingrix) 05/26/2019, 08/18/2019     I reviewed prior external note(s) from  cardiology, hospital stay  I reviewed the result(s) of the labs and imaging as noted above.   I have ordered PFT  Assessment:  Tobacco use disorder Mild radiographic emphysema  Plan/Recommendations: Start Wellbutrin. He is very motivated to quit. CT evidence of emphysema very mild.  Due for LDCT in march 2023 for lung cancer screening.  No need for inhaler therapies at this time. Will obtain PFTs at next visit.   Smoking Cessation Counseling:  1. The patient is an everyday smoker and symptomatic due to the following condition emphysema 2. The patient is currently contemplative in quitting smoking. 3. I advised patient to quit smoking. 4. We identified patient specific barriers to change.  5. I personally spent 4 minutes  counseling the patient regarding tobacco use disorder. 6. We discussed management of stress and anxiety to help with smoking cessation, when applicable. 7. We discussed nicotine replacement therapy, Wellbutrin, Chantix as possible options. 8. I advised setting a quit date. 9. Follow?up arranged with our office to continue ongoing discussions. 10.Resources given to patient including quit hotline.   We discussed disease management and progression at length today.     Return to Care: Return in about 6 weeks (around 11/10/2021).  Lenice Llamas, MD [facility]  CC: Charlie Pitter, [place]

## 2021-09-29 NOTE — Patient Instructions (Signed)
Please schedule follow up scheduled with myself in 6 weeks.  If my schedule is not open yet, we will contact you with a reminder closer to that time. Please call [phone] if you haven't heard from [place] a month before.   Before your next visit I would like you to have: Full set of PFTs - 40  minutes  Bupropion -- Bupropion (brand names: Zyban, Wellbutrin) is an antidepressant that can be used to help you stop smoking.  Start taking it once per day for three days, then increase to twice daily starting four weeks before the quit date.  You should typically continue for 7 to 12 weeks after you quit smoking.  Please do not stop taking this medication abruptly.  When you are ready to stop we will have you go to once daily for two weeks and then you can do once every other day for a week before you stop.   Bupropion is generally well-tolerated, but it may cause dry mouth and difficulty sleeping. The drug should not be used by people who have a seizure disorder or bipolar (manic-depressive) disorder, and it is not recommended for those who have head trauma, anorexia nervosa, or bulimia, or for those who drink alcohol excessively.  What are the benefits of quitting smoking? Quitting smoking can lower your chances of getting or dying from heart disease, lung disease, kidney failure, infection, or cancer. It can also lower your chances of getting osteoporosis, a condition that makes your bones weak. Plus, quitting smoking can help your skin look younger and reduce the chances that you will have problems with sex.  Quitting smoking will improve your health no matter how old you are, and no matter how long or how much you have smoked.  What should I do if I want to quit smoking? The letters in the word "START" can help you remember the steps to take: S = Set a quit date. T = Tell family, friends, and the people around you that you plan to quit. A = Anticipate or plan ahead for the tough times you'll face  while quitting. R = Remove cigarettes and other tobacco products from your home, car, and work. T = Talk to your doctor about getting help to quit.  How can my doctor or nurse help? Your doctor or nurse can give you advice on the best way to quit. He or she can also put you in touch with counselors or other people you can call for support. Plus, your doctor or nurse can give you medicines to: ?Reduce your craving for cigarettes ?Reduce the unpleasant symptoms that happen when you stop smoking (called "withdrawal symptoms"). You can also get help from a free phone line (1-800-QUIT-NOW) or go online to [facility].  What are the symptoms of withdrawal? The symptoms include: ?Trouble sleeping ?Being irritable, anxious or restless ?Getting frustrated or angry ?Having trouble thinking clearly  Some people who stop smoking become temporarily depressed. Some people need treatment for depression, such as counseling or antidepressant medicines. Depressed people might: ?No longer enjoy or care about doing the things they used to like to do ?Feel sad, down, hopeless, nervous, or cranky most of the day, almost every day ?Lose or gain weight ?Sleep too much or too little ?Feel tired or like they have no energy ?Feel guilty or like they are worth nothing ?Forget things or feel confused ?Move and speak more slowly than usual ?Act restless or have trouble staying still ?Think about death or  suicide  If you think you might be depressed, see your doctor or nurse. Only someone trained in mental health can tell for sure if you are depressed. If you ever feel like you might hurt yourself, go straight to the nearest emergency department. Or you can call for an ambulance (in the [place] and [place], [place] 9-1-1) or call your doctor or nurse right away and tell them it is an emergency. You can also reach the [place] National Suicide Prevention Lifeline at (252) 853-1207 or http://walker-sanchez.info/.  How  do medicines help you stop smoking? Different medicines work in different ways: ?Nicotine replacement therapy eases withdrawal and reduces your body's craving for nicotine, the main drug found in cigarettes. There are different forms of nicotine replacement, including skin patches, lozenges, gum, nasal sprays, and "puffers" or inhalers. Many can be bought without a prescription, while others might require one. ?Bupropion is a prescription medicine that reduces your desire to smoke. This medicine is sold under the brand names Zyban and Wellbutrin. It is also available in a generic version, which is cheaper than brand name medicines. ?Varenicline (brand names: Chantix, Champix) is a prescription medicine that reduces withdrawal symptoms and cigarette cravings. If you think you'd like to take varenicline and you have a history of depression, anxiety, or heart disease, discuss this with your doctor or nurse before taking the medicine. Varenicline can also increase the effects of alcohol in some people. It's a good idea to limit drinking while you're taking it, at least until you know how it affects you.  How does counseling work? Counseling can happen during formal office visits or just over the phone. A counselor can help you: ?Figure out what triggers your smoking and what to do instead ?Overcome cravings ?Figure out what went wrong when you tried to quit before  What works best? Studies show that people have the best luck at quitting if they take medicines to help them quit and work with a Social worker. It might also be helpful to combine nicotine replacement with one of the prescription medicines that help people quit. In some cases, it might even make sense to take bupropion and varenicline together.  What about e-cigarettes? Sometimes people wonder if using electronic cigarettes, or "e-cigarettes," might help them quit smoking. Using e-cigarettes is also called "vaping." Doctors do not recommend  e-cigarettes in place of medicines and counseling. That's because e-cigarettes still contain nicotine as well as other substances that might be harmful. It's not clear how they can affect a person's health in the long term.  Will I gain weight if I quit? Yes, you might gain a few pounds. But quitting smoking will have a much more positive effect on your health than weighing a few pounds more. Plus, you can help prevent some weight gain by being more active and eating less. Taking the medicine bupropion might help control weight gain.   What else can I do to improve my chances of quitting? You can: ?Start exercising. ?Stay away from smokers and places that you associate with smoking. If people close to you smoke, ask them to quit with you. ?Keep gum, hard candy, or something to put in your mouth handy. If you get a craving for a cigarette, try one of these instead. ?Don't give up, even if you start smoking again. It takes most people a few tries before they succeed.  What if I am pregnant and I smoke? If you are pregnant, it's really important for the health of your baby that  you quit. Ask your doctor what options you have, and what is safest for your baby

## 2021-09-30 ENCOUNTER — Ambulatory Visit (HOSPITAL_BASED_OUTPATIENT_CLINIC_OR_DEPARTMENT_OTHER): Payer: Medicare Other

## 2021-09-30 ENCOUNTER — Ambulatory Visit (HOSPITAL_COMMUNITY)
Admission: RE | Admit: 2021-09-30 | Discharge: 2021-09-30 | Disposition: A | Discharge status: Home / Self Care | Payer: Medicare Other | Source: Ambulatory Visit | Attending: Student | Admitting: Student

## 2021-09-30 ENCOUNTER — Other Ambulatory Visit: Payer: Medicare Other | Admitting: *Deleted

## 2021-09-30 DIAGNOSIS — Z720 Tobacco use: Secondary | ICD-10-CM

## 2021-09-30 DIAGNOSIS — I2510 Atherosclerotic heart disease of native coronary artery without angina pectoris: Secondary | ICD-10-CM

## 2021-09-30 DIAGNOSIS — J449 Chronic obstructive pulmonary disease, unspecified: Secondary | ICD-10-CM | POA: Diagnosis not present

## 2021-09-30 DIAGNOSIS — I2584 Coronary atherosclerosis due to calcified coronary lesion: Secondary | ICD-10-CM | POA: Insufficient documentation

## 2021-09-30 DIAGNOSIS — G911 Obstructive hydrocephalus: Secondary | ICD-10-CM | POA: Insufficient documentation

## 2021-09-30 DIAGNOSIS — I700 Atherosclerosis of aorta: Secondary | ICD-10-CM | POA: Diagnosis not present

## 2021-09-30 DIAGNOSIS — G930 Cerebral cysts: Secondary | ICD-10-CM | POA: Diagnosis not present

## 2021-09-30 DIAGNOSIS — I4510 Unspecified right bundle-branch block: Secondary | ICD-10-CM | POA: Diagnosis not present

## 2021-09-30 DIAGNOSIS — R220 Localized swelling, mass and lump, head: Secondary | ICD-10-CM | POA: Diagnosis not present

## 2021-09-30 DIAGNOSIS — I10 Essential (primary) hypertension: Secondary | ICD-10-CM | POA: Insufficient documentation

## 2021-09-30 DIAGNOSIS — E785 Hyperlipidemia, unspecified: Secondary | ICD-10-CM | POA: Insufficient documentation

## 2021-09-30 LAB — LIPID PANEL
Chol/HDL Ratio: 3.7 ratio (ref 0.0–5.0)
Cholesterol, Total: 168 mg/dL (ref 100–199)
HDL: 45 mg/dL (ref >39)
LDL Chol Calc (NIH): 83 mg/dL (ref 0–99)
Triglycerides: 243 mg/dL — ABNORMAL HIGH (ref 0–149)
VLDL Cholesterol Cal: 40 mg/dL (ref 5–40)

## 2021-09-30 LAB — COMPREHENSIVE METABOLIC PANEL
ALT: 12 IU/L (ref 0–44)
AST: 14 IU/L (ref 0–40)
Albumin/Globulin Ratio: 2.6 — ABNORMAL HIGH (ref 1.2–2.2)
Albumin: 4.9 g/dL — ABNORMAL HIGH (ref 3.8–4.8)
Alkaline Phosphatase: 122 IU/L — ABNORMAL HIGH (ref 44–121)
BUN/Creatinine Ratio: 13 (ref 10–24)
BUN: 12 mg/dL (ref 8–27)
Bilirubin Total: 0.7 mg/dL (ref 0.0–1.2)
CO2: 21 mmol/L (ref 20–29)
Calcium: 9.8 mg/dL (ref 8.6–10.2)
Chloride: 102 mmol/L (ref 96–106)
Creatinine, Ser: 0.92 mg/dL (ref 0.76–1.27)
Globulin, Total: 1.9 g/dL (ref 1.5–4.5)
Glucose: 108 mg/dL — ABNORMAL HIGH (ref 70–99)
Potassium: 5.1 mmol/L (ref 3.5–5.2)
Sodium: 138 mmol/L (ref 134–144)
Total Protein: 6.8 g/dL (ref 6.0–8.5)
eGFR: 92 mL/min/{1.73_m2} (ref >59)

## 2021-09-30 LAB — ECHOCARDIOGRAM COMPLETE
Area-P 1/2: 3.91 cm2
S' Lateral: 3.5 cm

## 2021-09-30 NOTE — Progress Notes (Signed)
Pt has been made aware of normal result and verbalized understanding.  jw

## 2021-10-03 ENCOUNTER — Telehealth: Payer: Self-pay | Admitting: *Deleted

## 2021-10-03 DIAGNOSIS — E785 Hyperlipidemia, unspecified: Secondary | ICD-10-CM

## 2021-10-03 MED ORDER — ROSUVASTATIN CALCIUM 20 MG PO TABS: 20.0000 mg | ORAL_TABLET | Freq: Every day | ORAL | 3 refills | Status: DC

## 2021-10-03 NOTE — Telephone Encounter (Signed)
-----  Message from Charlie Pitter, [place] sent at 09/30/2021  5:51 PM EST ----- Please let Luis Henderson know overall labs OK. LFTs are mildly abnormal with marginally elevated albumin and alk phos just 1 Luis Henderson above goal, not acutely concerned about this. His cholesterol shows elevated triglycerides. LDL is not bad but not at goal either. LDL 82 and would like to see <70. If he is amenable, would increase rosuvastatin to $RemoveBeforeD'20mg'[id number]$  daily and recheck fasting liver/lipids in 8 weeks. If he would like to work and improved diet and exercise first as tolerated to bring down lipids without adjusting meds, would recheck fasting liver/lipids in 3 months after trial of lifestyle changes.

## 2021-10-19 ENCOUNTER — Ambulatory Visit (INDEPENDENT_AMBULATORY_CARE_PROVIDER_SITE_OTHER): Payer: Commercial Managed Care - HMO | Admitting: Neurology

## 2021-10-19 ENCOUNTER — Encounter: Payer: Self-pay | Admitting: Neurology

## 2021-10-19 VITALS — BP 122/73 | HR 73 | Ht 71.0 in | Wt 187.0 lb

## 2021-10-19 DIAGNOSIS — Z982 Presence of cerebrospinal fluid drainage device: Secondary | ICD-10-CM | POA: Diagnosis not present

## 2021-10-19 DIAGNOSIS — G911 Obstructive hydrocephalus: Secondary | ICD-10-CM

## 2021-10-19 DIAGNOSIS — T8509XD Other mechanical complication of ventricular intracranial (communicating) shunt, subsequent encounter: Secondary | ICD-10-CM

## 2021-10-19 NOTE — Progress Notes (Signed)
Provider:  Larey Henderson, M D  Referring Provider: Scheryl Marten, PA Primary Care Physician:  Luis Carol, MD  Chief Complaint  Patient presents with   New Patient (Initial Visit)    pt alone here ,rm 10. pt states that in 2017 had a first VP shunt procedure in hospital as emergency Procedure by Dr Luis Henderson. 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 Site is still tender. Balance is now better,  slow but steady improvement.  This appointment was set by Neurosurgery for post surgical follow up.       HPI:  Luis Henderson is a 66 y.o. male seen here upon a RV on 10-19-2021: For a follow up on hydrocephalus/ VP shunt was just replaced December 2022. Luis Henderson and Luis Henderson have followed him.     The patient's balance problems exacerbated prior to the revision surgery and he quit his part time job at [employer] had to because he could no longer ascend on ladders to higher shelves or storage areas.  He was protected by a safety belt when he slipped and would otherwise have fallen 15 feet , he states. That was 08-30-2021.  Went for CT scan with NS Luis Henderson, and was admitted to hospital for new shunt.   He is slowly getting better in terms of balance but the recovery was not immediate after surgery.  The last imaging studies prior to revision surgery showed hydrocephalus.  The patient has only quit smoking about a week ago.  He used to have a 1 pack/ day habit for the last 30 years plus.  He is on the generic form of Wellbutrin-bupropion to help him abstain from tobacco use.  He deals with some subclinical depression.  9 out of 15 points on the geriatric depression score.  He said it is mostly situational due to loss of job financial repercussions health worries.  Luis Henderson also has added atherosclerosis of the order as seen in  imaging on 03-08-2020.  Coronary artery disease in native arteries, mixed anxiety disorder, pulmonary emphysema, balance disorder worsening towards the end of last year, poor sleep habits with delayed sleep phase with followed by Dr. Nehemiah Henderson.  VP shunt revision just last months.  The patient is edentulous.  His mobility is very limited, Uber or lift would pose a financial strain, and he has not had physical therapy for these reasons.    06-09-2019: Luis Henderson reports that in 2017- he gradually lost control of his motor function especially in the lower extremities, and also control of bladder and bowel.  This was related to the development of hydrocephalus.  Luis Henderson was his neurosurgeon who placed the VP shunt emergently.  After the procedure he was arrested and jailed in [place] for 6 month until all charges were reportedly dismissed, he reports that when he returned after his release he faced costs for the upkeeping of his cats, several thousand dollars, and he had lost his home, his wife had left him.  He was unemployed and uninsured- and no follow up. He now has insurance. He developed emesis after a large meals, and he suspects he has some obstruction. He had his gallbladder removed 3 weeks ago. 05-01-2019. Readmitted from 05-02-21-2020 from emesis.  Last visit with Luis Henderson on 12-10-2019. His hematological changes have recovered.  Has healed  well after gallbladder surgery. He had a double hernia surgery, recovered well after he initially had scrotum swelling. He still smokes.He works at [employer]. He walks a lot, reports no SOB.  No other medical interval history- RV should be every year from now on.    Neurological question is the follow up for shunt VP function.     Review of Systems: Out of a complete 14 system review, the patient complains of only the following symptoms, and all other reviewed systems are negative. He was also diagnosed with a renal  cyst and stones, he described dizziness, polycythemia. Has HTN, bilateral inguinal hernias.   nausea, inflammation,no longer  belly pain, no SOB.   Social History   Socioeconomic History   Marital status: Divorced    Spouse name: Not on file   Number of children: 2   Years of education: Not on file   Highest education level: Not on file  Occupational History    Employer: [email or web address]    Comment: Radio broadcast assistant  Social Needs   Financial resource strain: Not on file   Food insecurity    Worry: Not on file    Inability: Not on file   Transportation needs    Medical: Not on file    Non-medical: Not on file  Tobacco Use   Smoking status: Current Every Day Smoker    Packs/day: 1.00    Years: 28.00    Pack years: 28.00    Types: Cigarettes   Smokeless tobacco: Never Used  Substance and Sexual Activity   Alcohol use: Yes    Alcohol/week: 5.0 - 6.0 standard drinks    Types: 5 - 6 Cans of beer per week    Comment: daily alcohol/drinks beer, wine, and liquor   Drug use: Yes    Types: Marijuana   Sexual activity: Not on file  Lifestyle   Physical activity    Days per week: Not on file    Minutes per session: Not on file   Stress: Not on file  Relationships   Social connections    Talks on phone:     Gets together:     Attends religious service:     Active member of club or organization:     Attends meetings of clubs or organizations:     Relationship status:    Intimate partner violence    Fear of current or ex partner:     Emotionally abused:     Physically abused:     Forced sexual activity:   Other Topics Concern   Not on file   HOMELESS , lives in his [place] car with 5 cats    Social History Narrative   Not on file  Wife left in 2014 after 9 years of marriage.  Radio broadcast assistant at [employer].     Family History  Problem Relation Age of Onset   Breast cancer Mother    Hyperlipidemia Father    Colon cancer Neg Hx    Esophageal cancer Neg Hx    Stomach cancer  Neg Hx    Pancreatic cancer Neg Hx    Lung disease Neg Hx     Past Medical History:  Diagnosis Date   Alcohol abuse    Anxiety    denies   Aortic atherosclerosis ([place])    Coronary artery calcification seen on CT scan    Depression    denies   Diverticulitis 04/2019   History of kidney stones  History of prediabetes    Hyperlipidemia    Hypertension    Polycythemia    Polycythemia    RBBB    S/P VP shunt    Tobacco use     Past Surgical History:  Procedure Laterality Date   CHOLECYSTECTOMY N/A 05/06/2019   Procedure: LAPAROSCOPIC CHOLECYSTECTOMY WITH INTRAOPERATIVE CHOLANGIOGRAM;  Surgeon: Donnie Mesa, MD;  Location: [facility];  Service: General;  Laterality: N/A;   COLONOSCOPY     CYSTOSCOPY WITH RETROGRADE PYELOGRAM, URETEROSCOPY AND STENT PLACEMENT Right 01/15/2015   Procedure: CYSTOSCOPY, RIGHT URETEROSCOPY/ RETROGRADE PYELOGRAM,HOLMIUM LASER/ LITHOTRIPSY, STENT PLACEMENT, URETERAL BALLOON DIALATION;  Surgeon: Kathie Rhodes, MD;  Location: [facility];  Service: Urology;  Laterality: Right;   INGUINAL HERNIA REPAIR Bilateral 08/05/2019   Procedure: OPEN BILATERAL INGUINAL HERNIA REPAIRS WITH MESH;  Surgeon: Donnie Mesa, MD;  Location: [facility];  Service: General;  Laterality: Bilateral;   INSERTION OF MESH Bilateral 08/05/2019   Procedure: INSERTION OF MESH;  Surgeon: Donnie Mesa, MD;  Location: [facility];  Service: General;  Laterality: Bilateral;   SHUNT REVISION N/A 09/14/2021   Procedure: SHUNT REVISION;  Surgeon: Newman Pies, MD;  Location: [facility];  Service: Neurosurgery;  Laterality: N/A;   SKIN GRAFT     left hand   VENTRICULOPERITONEAL SHUNT Right 09/04/2016   Procedure: SHUNT INSERTION VENTRICULAR-PERITONEAL;  Surgeon: Newman Pies, MD;  Location: [facility];  Service: Neurosurgery;  Laterality: Right;   WISDOM TOOTH EXTRACTION      Current Outpatient Medications  Medication Sig Dispense Refill   albuterol (PROVENTIL) (2.5  MG/3ML) 0.083% nebulizer solution Take 2.5 mg by nebulization every 6 (six) hours.     amLODipine (NORVASC) 10 MG tablet Take 10 mg by mouth daily.     aspirin EC 81 MG tablet Take 81 mg by mouth daily. Swallow whole.     buPROPion (WELLBUTRIN SR) 150 MG 12 hr tablet Take 1 tablet (150 mg total) by mouth 2 (two) times daily. 60 tablet 5   losartan (COZAAR) 50 MG tablet Take 1 tablet (50 mg total) by mouth daily. 90 tablet 2   rosuvastatin (CRESTOR) 40 MG tablet Take 40 mg by mouth daily.     No current facility-administered medications for this visit.      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  Very enlarged ventricals.  Vitals: BP 122/73    Pulse 73    Ht 5\' 11"  (1.803 m)    Wt 187 lb (84.8 kg)    BMI 26.08 kg/m  Last Weight:  Wt Readings from Last 1 Encounters:  10/19/21 187 lb (84.8 kg)   Last Height:   Ht Readings from Last 1 Encounters:  10/19/21 5\' 11"  (1.803 m)    Physical exam:  General: The patient is awake, alert and appears not in acute distress. The patient is  groomed. Head: Normocephalic, atraumatic. Neck is supple. Mallampati 3 poor dentition-  , neck circumference: 15" Cardiovascular:  Regular rate and rhythm - without distended neck veins. Respiratory: Lungs are clear to auscultation. Skin:  Without evidence of edema, or rash Trunk: BMI is 26. The  patient  has normal posture.  Neurologic exam : The patient is awake and alert, oriented to place and time. Memory  subjective described as intact. There is a normal attention span & concentration ability.  Speech is fluent without pressure- no dysarthria, dysphonia or aphasia.  Mood and affect are appropriate.  Cranial nerves: Pupils are equal and briskly reactive to light.  Funduscopic exam without evidence of pallor or edema.  Extraocular movements  in vertical and horizontal planes intact and without nystagmus.  Visual fields by finger perimetry are intact. Hearing to tuning fork is intact. Facial sensation intact to fine touch.  Facial motor strength is symmetric and tongue and uvula move midline. Tongue protrusion into either cheek is normal. Shoulder shrug is normal.   Motor exam:  Normal tone ,muscle bulk and symmetric strength in all extremities.  Sensory:  Fine touch  and vibration were felt normal inhands and feet. .  Coordination: Rapid alternating movements in the fingers/hands were normal. Penmanship is unchanged.  Finger-to-nose maneuver  normal without evidence of ataxia, dysmetria or tremor.  Gait and station: Patient walks without assistive device and is able unassisted to climb up to the exam table. Strength within normal limits. Rise from chair with bracing himself. Walks fast, turned with 2 steps, good rotation in hip and spine- no shuffle, normal arm swig. No reported loss of balance when moving, no Vertigo. Stance is stable and normal based. Tandem gait is unfragmented. Romberg testing is negative   Deep tendon reflexes: in the upper and lower extremities are symmetric and intact. Babinski deferred.   Assessment:  After physical and neurologic examination, review of laboratory studies, imaging, neurophysiology testing and pre-existing records, 22 minutes assessment with gait and eye examination, interview- is that of :  Post  VP shunt revision there is no evidence of returning intracranial pressure elevation  - normal EOM, normal balance and no return of incontinence.  I had last seen him in February lat year when these symptoms were not present, either   Plan:  Treatment plan and additional workup :  We do not need to get another CT image of the head to make sure the VP shunt works well, as it clinically apears. He just had a CT with Dr  Luis Henderson.  He is ery depressed- I suggested a volunteer job, he mentioned working in [employer]. He doesn't want the vaccine booster of [employer] vaccine , but hat may open the door to some work opportunities.  Only if symptoms of hydrocephalus should re- occur.  RV every year prn.   Asencion Partridge Jhace Fennell MD 10/19/2021

## 2021-10-19 NOTE — Patient Instructions (Signed)
Brain Shunt Home Guide A brain shunt is a small plastic tube that is used to drain cerebrospinal fluid (CSF) from your brain into a sac in your abdomen (peritoneum). The peritoneum absorbs this fluid and gets rid of it. You may need a brain shunt if you have too much CSF inside your brain (hydrocephalus). CSF is a type of fluid that cushions your brain and spine. Normally, your brain releases this fluid and then reabsorbs it through drainage channels. If your brain's drainage channels are not working right, this fluid builds up and will need to be redirected with a shunt. Your health care provider will decide how much fluid needs to be drained and will adjust settings on your shunt. Some shunt settings cannot be changed after they have been set (nonprogrammable shunt). Others can be adjusted by your health care provider (programmable shunt). You may feel the tube under your skin behind your ear and where it passes down your neck and your chest before it enters your abdomen. When will I have my shunt removed? Depending on your condition, your shunt may be temporary or permanent. For some people, a brain shunt is a lifelong device. What precautions must I follow? If you have a shunt, you need to take precautions and be aware of signs that may tell you there is a problem with the shunt. After your shunt is placed, take the following precautions: Contact your health care provider if you have a programmable shunt and need to have an MRI. This is very important because many programmable shunts are sensitive to magnets in MRI machines. Before having any surgery, especially abdominal surgery, tell the surgeon about your shunt. You may need to take antibiotic medicines before having a procedure. Do not wear tight-fitting hats or headgear. Return to your normal activities as told by your health care provider. Ask your health care provider what activities are safe for you. What are the warning signs of a shunt  malfunction? A brain shunt can stop working or become clogged. If the shunt is not working properly, it will not drain the CSF. This can cause an increase in brain pressure. You must know the warning signs of a shunt malfunction because they can start suddenly. These include: A headache that gets worse over time. Vomiting without cause. Feeling sleepier than usual. Loss of appetite. Low energy. Irritability. Severe symptoms include: Personality change or confusion. Vision changes, such as blurry vision, double vision, or loss of vision. Swelling of the skin that runs along the path of the shunt. A return of your original symptoms. Trouble walking. Inability to control your bladder (urinary incontinence). Having a seizure. What are the warning signs of a shunt infection? If bacteria gets into the tissue around the shunt, you can develop an infection. This can cause your shunt to stop working properly. Watch for signs of infection, such as: Fever. Redness or swelling of the skin along the shunt path. Pain around the shunt or shunt tubing. A headache or a stiff neck. Nausea or vomiting. Questions to ask your health care provider: What is my surgeon's contact information? What is the name and type of my brain shunt? What activities are safe for me? Where to find more information The Hydrocephalus Association: www.hydroassoc.org The Spina Bifida Association: www.spinabifidaassociation.Page of Neurological Disorders and Stroke: MasterBoxes.it Contact a health care provider if: You are sleepier than usual or have trouble waking up. You become irritable or start to behave abnormally. You have a fever. Get  help right away if: You notice redness or swelling along the shunt path. You vomit for no reason. You have a headache that is getting worse. You start to twitch or shake (seizure). You have vision problems. You lose coordination or balance. These symptoms  may represent a serious problem that is an emergency. Do not wait to see if the symptoms will go away. Get medical help right away. Call your local emergency services (911 in the [place] Do not drive yourself to the hospital. Summary A brain shunt is a small plastic tube used to drain cerebrospinal fluid (CSF) from your brain into a sac in your abdomen (peritoneum). You may need a brain shunt if you have too much CSF inside your brain (hydrocephalus). Your shunt may be temporary or permanent, depending on your condition. A brain shunt can malfunction or become clogged. If the shunt is not working right, it will not drain the CSF. The shunt can also get infected. Warning signs of shunt malfunction include headache, vomiting, drowsiness, loss of appetite, low energy, irritability, vision changes, urinary incontinence, and seizures. Warning signs of a shunt infection include fever, redness or swelling of skin along the shunt path, pain around the shunt, headache, stiff neck, nausea, or vomiting. This information is not intended to replace advice given to you by your health care provider. Make sure you discuss any questions you have with your health care provider. Document Revised: 08/30/2020 Document Reviewed: 08/30/2020 Elsevier Patient Education  2022 [employer].

## 2021-11-10 ENCOUNTER — Ambulatory Visit: Payer: Medicare Other | Admitting: Internal Medicine

## 2021-11-25 ENCOUNTER — Telehealth: Payer: Self-pay | Admitting: Hematology and Oncology

## 2021-11-25 NOTE — Telephone Encounter (Signed)
Cancelled appt per 2/10 pt request, pt said they will call back to r/s

## 2021-11-29 ENCOUNTER — Inpatient Hospital Stay: Payer: Medicare Other

## 2021-11-29 ENCOUNTER — Inpatient Hospital Stay: Payer: Medicare Other | Admitting: Hematology and Oncology

## 2021-12-05 ENCOUNTER — Other Ambulatory Visit: Payer: Medicare Other

## 2021-12-14 ENCOUNTER — Ambulatory Visit: Payer: Medicare Other | Admitting: Adult Health

## 2021-12-29 DIAGNOSIS — I739 Peripheral vascular disease, unspecified: Secondary | ICD-10-CM | POA: Diagnosis not present

## 2021-12-29 DIAGNOSIS — J439 Emphysema, unspecified: Secondary | ICD-10-CM | POA: Diagnosis not present

## 2021-12-29 DIAGNOSIS — I700 Atherosclerosis of aorta: Secondary | ICD-10-CM | POA: Diagnosis not present

## 2021-12-29 DIAGNOSIS — G4721 Circadian rhythm sleep disorder, delayed sleep phase type: Secondary | ICD-10-CM | POA: Diagnosis not present

## 2021-12-29 DIAGNOSIS — R7301 Impaired fasting glucose: Secondary | ICD-10-CM | POA: Diagnosis not present

## 2021-12-29 DIAGNOSIS — E7800 Pure hypercholesterolemia, unspecified: Secondary | ICD-10-CM | POA: Diagnosis not present

## 2021-12-29 DIAGNOSIS — I2510 Atherosclerotic heart disease of native coronary artery without angina pectoris: Secondary | ICD-10-CM | POA: Diagnosis not present

## 2021-12-29 DIAGNOSIS — Z982 Presence of cerebrospinal fluid drainage device: Secondary | ICD-10-CM | POA: Diagnosis not present

## 2021-12-29 DIAGNOSIS — R229 Localized swelling, mass and lump, unspecified: Secondary | ICD-10-CM | POA: Diagnosis not present

## 2021-12-29 DIAGNOSIS — R2689 Other abnormalities of gait and mobility: Secondary | ICD-10-CM | POA: Diagnosis not present

## 2021-12-29 DIAGNOSIS — I10 Essential (primary) hypertension: Secondary | ICD-10-CM | POA: Diagnosis not present

## 2021-12-29 DIAGNOSIS — Z0000 Encounter for general adult medical examination without abnormal findings: Secondary | ICD-10-CM | POA: Diagnosis not present

## 2021-12-29 DIAGNOSIS — F17218 Nicotine dependence, cigarettes, with other nicotine-induced disorders: Secondary | ICD-10-CM | POA: Diagnosis not present

## 2021-12-29 DIAGNOSIS — Z23 Encounter for immunization: Secondary | ICD-10-CM | POA: Diagnosis not present

## 2022-01-24 DIAGNOSIS — L708 Other acne: Secondary | ICD-10-CM | POA: Diagnosis not present

## 2022-01-24 DIAGNOSIS — L218 Other seborrheic dermatitis: Secondary | ICD-10-CM | POA: Diagnosis not present

## 2022-01-24 DIAGNOSIS — D485 Neoplasm of uncertain behavior of skin: Secondary | ICD-10-CM | POA: Diagnosis not present

## 2022-01-30 ENCOUNTER — Other Ambulatory Visit: Payer: Self-pay | Admitting: *Deleted

## 2022-01-30 DIAGNOSIS — E7800 Pure hypercholesterolemia, unspecified: Secondary | ICD-10-CM

## 2022-01-30 DIAGNOSIS — Z79899 Other long term (current) drug therapy: Secondary | ICD-10-CM

## 2022-01-30 DIAGNOSIS — I10 Essential (primary) hypertension: Secondary | ICD-10-CM

## 2022-01-30 MED ORDER — LOSARTAN POTASSIUM 50 MG PO TABS: 50.0000 mg | ORAL_TABLET | Freq: Every day | ORAL | 2 refills | Status: AC

## 2022-01-31 DIAGNOSIS — E119 Type 2 diabetes mellitus without complications: Secondary | ICD-10-CM | POA: Diagnosis not present

## 2022-01-31 DIAGNOSIS — H5213 Myopia, bilateral: Secondary | ICD-10-CM | POA: Diagnosis not present

## 2022-06-23 ENCOUNTER — Telehealth: Payer: Self-pay

## 2022-06-23 NOTE — Patient Outreach (Signed)
  Care Coordination   06/23/2022 Name: Luis Henderson MRN: [id number] DOB: Jan 19, 1956   Care Coordination Outreach Attempts:  An unsuccessful telephone outreach was attempted today to offer the patient information about available care coordination services as a benefit of their health plan.   Follow Up Plan:  Additional outreach attempts will be made to offer the patient care coordination information and services.   Encounter Outcome:  No Answer  Care Coordination Interventions Activated:  No   Care Coordination Interventions:  No, not indicated    [facility] [phone]

## 2022-07-10 ENCOUNTER — Encounter (HOSPITAL_COMMUNITY): Payer: Self-pay

## 2022-07-10 ENCOUNTER — Emergency Department (HOSPITAL_COMMUNITY): Payer: Medicare Other

## 2022-07-10 ENCOUNTER — Emergency Department (HOSPITAL_COMMUNITY)
Admission: EM | Admit: 2022-07-10 | Discharge: 2022-07-11 | Discharge status: Against Medical Advice | Payer: Medicare Other | Attending: Physician Assistant | Admitting: Physician Assistant

## 2022-07-10 ENCOUNTER — Other Ambulatory Visit: Payer: Self-pay

## 2022-07-10 DIAGNOSIS — I700 Atherosclerosis of aorta: Secondary | ICD-10-CM | POA: Diagnosis not present

## 2022-07-10 DIAGNOSIS — R7309 Other abnormal glucose: Secondary | ICD-10-CM | POA: Diagnosis not present

## 2022-07-10 DIAGNOSIS — R112 Nausea with vomiting, unspecified: Secondary | ICD-10-CM | POA: Diagnosis not present

## 2022-07-10 DIAGNOSIS — R1030 Lower abdominal pain, unspecified: Secondary | ICD-10-CM | POA: Diagnosis not present

## 2022-07-10 DIAGNOSIS — R109 Unspecified abdominal pain: Secondary | ICD-10-CM | POA: Insufficient documentation

## 2022-07-10 DIAGNOSIS — Z5321 Procedure and treatment not carried out due to patient leaving prior to being seen by health care provider: Secondary | ICD-10-CM | POA: Diagnosis not present

## 2022-07-10 DIAGNOSIS — K529 Noninfective gastroenteritis and colitis, unspecified: Secondary | ICD-10-CM | POA: Diagnosis not present

## 2022-07-10 DIAGNOSIS — R1110 Vomiting, unspecified: Secondary | ICD-10-CM | POA: Diagnosis not present

## 2022-07-10 LAB — CBC WITH DIFFERENTIAL/PLATELET
Abs Immature Granulocytes: 0.07 10*3/uL (ref 0.00–0.07)
Basophils Absolute: 0.1 10*3/uL (ref 0.0–0.1)
Basophils Relative: 1 %
Eosinophils Absolute: 0.1 10*3/uL (ref 0.0–0.5)
Eosinophils Relative: 1 %
HCT: 54.9 % — ABNORMAL HIGH (ref 39.0–52.0)
Hemoglobin: 18.3 g/dL — ABNORMAL HIGH (ref 13.0–17.0)
Immature Granulocytes: 1 %
Lymphocytes Relative: 11 %
Lymphs Abs: 1.3 10*3/uL (ref 0.7–4.0)
MCH: 30.4 pg (ref 26.0–34.0)
MCHC: 33.3 g/dL (ref 30.0–36.0)
MCV: 91.3 fL (ref 80.0–100.0)
Monocytes Absolute: 0.7 10*3/uL (ref 0.1–1.0)
Monocytes Relative: 6 %
Neutro Abs: 9.4 10*3/uL — ABNORMAL HIGH (ref 1.7–7.7)
Neutrophils Relative %: 80 %
Platelets: 283 10*3/uL (ref 150–400)
RBC: 6.01 MIL/uL — ABNORMAL HIGH (ref 4.22–5.81)
RDW: 13.8 % (ref 11.5–15.5)
WBC: 11.7 10*3/uL — ABNORMAL HIGH (ref 4.0–10.5)
nRBC: 0 % (ref 0.0–0.2)

## 2022-07-10 LAB — LIPASE, BLOOD: Lipase: 34 U/L (ref 11–51)

## 2022-07-10 LAB — URINALYSIS, ROUTINE W REFLEX MICROSCOPIC
Bilirubin Urine: NEGATIVE
Glucose, UA: NEGATIVE mg/dL
Ketones, ur: NEGATIVE mg/dL
Leukocytes,Ua: NEGATIVE
Nitrite: NEGATIVE
Protein, ur: 100 mg/dL — AB
Specific Gravity, Urine: >1.03 — ABNORMAL HIGH (ref 1.005–1.030)
pH: 6 (ref 5.0–8.0)

## 2022-07-10 LAB — COMPREHENSIVE METABOLIC PANEL
ALT: 18 U/L (ref 0–44)
AST: 19 U/L (ref 15–41)
Albumin: 4.7 g/dL (ref 3.5–5.0)
Alkaline Phosphatase: 78 U/L (ref 38–126)
Anion gap: 10 (ref 5–15)
BUN: 12 mg/dL (ref 8–23)
CO2: 24 mmol/L (ref 22–32)
Calcium: 9.7 mg/dL (ref 8.9–10.3)
Chloride: 107 mmol/L (ref 98–111)
Creatinine, Ser: 1.14 mg/dL (ref 0.61–1.24)
GFR, Estimated: >60 mL/min (ref >60)
Glucose, Bld: 128 mg/dL — ABNORMAL HIGH (ref 70–99)
Potassium: 4.4 mmol/L (ref 3.5–5.1)
Sodium: 141 mmol/L (ref 135–145)
Total Bilirubin: 0.7 mg/dL (ref 0.3–1.2)
Total Protein: 7.4 g/dL (ref 6.5–8.1)

## 2022-07-10 LAB — CBG MONITORING, ED: Glucose-Capillary: 133 mg/dL — ABNORMAL HIGH (ref 70–99)

## 2022-07-10 LAB — URINALYSIS, MICROSCOPIC (REFLEX): Bacteria, UA: NONE SEEN

## 2022-07-10 MED ORDER — IOHEXOL 350 MG/ML SOLN
75.0000 mL | Freq: Once | INTRAVENOUS | Status: AC | PRN
  Administered 2022-07-10: 75 mL via INTRAVENOUS

## 2022-07-10 MED ORDER — ONDANSETRON 4 MG PO TBDP
4.0000 mg | ORAL_TABLET | Freq: Once | ORAL | Status: AC
  Administered 2022-07-10: 4 mg via ORAL
  Filled 2022-07-10: qty 1

## 2022-07-10 NOTE — ED Provider Triage Note (Signed)
Emergency Medicine Provider Triage Evaluation Note  Luis Henderson , a 66 y.o. male  was evaluated in triage.  Pt complains of lower abdominal pain located bilaterally.  No dysuria or hematuria.  He has had loose stools over the last few weeks, no recent antibiotics or travel.  Multiple episodes of NBNB emesis.  No chest pain, shortness of breath, flank pain.  Review of Systems  Positive: N/V, abd pain Negative:   Physical Exam  BP (!) 220/119 (BP Location: Right Arm)   Pulse 85   Temp 98.2 F (36.8 C) (Oral)   Resp 20   SpO2 97%  Gen:   Awake, no distress   Resp:  Normal effort  MSK:   Moves extremities without difficulty  Other:    Medical Decision Making  Medically screening exam initiated at 4:27 PM.  Appropriate orders placed.  JOYCE LECKEY was informed that the remainder of the evaluation will be completed by another provider, this initial triage assessment does not replace that evaluation, and the importance of remaining in the ED until their evaluation is complete.  N/V, lower abd pain   Nanette Wirsing A, PA-C 07/10/22 1628

## 2022-07-10 NOTE — ED Triage Notes (Signed)
COmplains of n/v abd that started yesterday.  Patient also had VP shunt. Denies urinary symptoms.

## 2022-07-10 NOTE — ED Notes (Addendum)
Pt came up yelling in my face, saying this hospital is "pathetic". I ask the patient for last name. Pt was explained the process , Pt yelling and over talking me.

## 2022-07-11 NOTE — ED Notes (Signed)
Pt was seen ripping out IV. Pt walked out using profanity said he is leaving

## 2022-09-21 ENCOUNTER — Other Ambulatory Visit: Payer: Self-pay

## 2022-09-21 DIAGNOSIS — I2510 Atherosclerotic heart disease of native coronary artery without angina pectoris: Secondary | ICD-10-CM | POA: Diagnosis not present

## 2022-09-21 DIAGNOSIS — J439 Emphysema, unspecified: Secondary | ICD-10-CM | POA: Diagnosis not present

## 2022-09-21 DIAGNOSIS — I10 Essential (primary) hypertension: Secondary | ICD-10-CM | POA: Diagnosis not present

## 2022-09-21 DIAGNOSIS — E7800 Pure hypercholesterolemia, unspecified: Secondary | ICD-10-CM | POA: Diagnosis not present

## 2022-09-21 DIAGNOSIS — G4700 Insomnia, unspecified: Secondary | ICD-10-CM | POA: Diagnosis not present

## 2022-09-21 MED ORDER — ROSUVASTATIN CALCIUM 40 MG PO TABS: 40.0000 mg | ORAL_TABLET | Freq: Every day | ORAL | 0 refills | Status: AC

## 2022-10-19 ENCOUNTER — Ambulatory Visit: Payer: Commercial Managed Care - HMO | Admitting: Adult Health

## 2022-11-06 ENCOUNTER — Encounter: Payer: Self-pay | Admitting: Adult Health

## 2022-11-06 ENCOUNTER — Ambulatory Visit (INDEPENDENT_AMBULATORY_CARE_PROVIDER_SITE_OTHER): Payer: Self-pay | Admitting: Adult Health

## 2022-11-06 ENCOUNTER — Telehealth: Payer: Self-pay | Admitting: *Deleted

## 2022-11-06 VITALS — BP 138/76 | HR 72 | Ht 71.0 in | Wt 193.0 lb

## 2022-11-06 DIAGNOSIS — Z982 Presence of cerebrospinal fluid drainage device: Secondary | ICD-10-CM

## 2022-11-06 NOTE — Telephone Encounter (Signed)
Per MM NP I spoke with pt's PCP, Fredda Hammed, and advised her that due to pt's behavior in the office today, he has been dismissed from the practice. I asked her to have someone from her office reach out to the patient to try to help him and follow-up as he had verbalized to staff that he was depressed and felt off balance. I let her know the patient did not complete the visit but Jinny Blossom recommends he can f/u with neurosurgery if shunt is malfunctioning and CT could be ordered for evaluation. [place] appreciated the call and said she would have someone reach out to the patient.

## 2022-11-06 NOTE — Progress Notes (Addendum)
    PATIENT: PAULA ZIETZ DOB: [birth year]  REASON FOR VISIT: follow up HISTORY FROM: patient PRIMARY NEUROLOGIST: Dr. Brett Fairy  HISTORY OF PRESENT ILLNESS: Today 11/06/22  WEBBER MICHIELS is a 67 y.o. male who has been followed in this office for VP shunt. Returns today for follow-up. Feels like his balance has gotten worse again. Not as bad as it was prior to the last shunt revision. He has not had any falls. I mentioned to the patient about repeating a CT scan of the brain to evaluate his shunt.  I was in the process of placing the order when the patient began to tell me about a recent ED visit.  He states that his blood pressure was elevated in the ED.  He then became irate, face red using profanity while describing his experience in the ED.  I kindly stopped the patient and advised that I was happy to try to help him but asked if he could stop using profanity. He then looked at me and said you've "got to be f**ing kidding me." You don't need to be in this field if you can't handle profanity. I begin to close my computer to exit the room. He then jumped up and stormed out and was yelling profanities down the hallway.  Patient will be dismissed from our practice. I will send a letter to PCP to inform them.        Ward Givens, MSN, NP-C 11/06/2022, 1:35 PM [facility] [address], [place], [place] [address] [phone]

## 2022-11-14 ENCOUNTER — Other Ambulatory Visit: Payer: Self-pay | Admitting: Internal Medicine

## 2022-11-14 ENCOUNTER — Ambulatory Visit
Admission: RE | Admit: 2022-11-14 | Discharge: 2022-11-14 | Disposition: A | Discharge status: Home / Self Care | Payer: 59 | Source: Ambulatory Visit | Attending: Internal Medicine | Admitting: Internal Medicine

## 2022-11-14 DIAGNOSIS — Z982 Presence of cerebrospinal fluid drainage device: Secondary | ICD-10-CM | POA: Diagnosis not present

## 2022-11-14 DIAGNOSIS — I739 Peripheral vascular disease, unspecified: Secondary | ICD-10-CM | POA: Diagnosis not present

## 2022-11-14 DIAGNOSIS — R42 Dizziness and giddiness: Secondary | ICD-10-CM | POA: Diagnosis not present

## 2022-11-14 DIAGNOSIS — E7800 Pure hypercholesterolemia, unspecified: Secondary | ICD-10-CM | POA: Diagnosis not present

## 2022-11-14 DIAGNOSIS — J439 Emphysema, unspecified: Secondary | ICD-10-CM | POA: Diagnosis not present

## 2022-12-05 DIAGNOSIS — G911 Obstructive hydrocephalus: Secondary | ICD-10-CM | POA: Diagnosis not present

## 2022-12-05 DIAGNOSIS — R2681 Unsteadiness on feet: Secondary | ICD-10-CM | POA: Diagnosis not present

## 2022-12-05 DIAGNOSIS — G930 Cerebral cysts: Secondary | ICD-10-CM | POA: Diagnosis not present

## 2023-01-03 DIAGNOSIS — F17218 Nicotine dependence, cigarettes, with other nicotine-induced disorders: Secondary | ICD-10-CM | POA: Diagnosis not present

## 2023-01-03 DIAGNOSIS — I700 Atherosclerosis of aorta: Secondary | ICD-10-CM | POA: Diagnosis not present

## 2023-01-03 DIAGNOSIS — Z1159 Encounter for screening for other viral diseases: Secondary | ICD-10-CM | POA: Diagnosis not present

## 2023-01-03 DIAGNOSIS — I10 Essential (primary) hypertension: Secondary | ICD-10-CM | POA: Diagnosis not present

## 2023-01-03 DIAGNOSIS — G4700 Insomnia, unspecified: Secondary | ICD-10-CM | POA: Diagnosis not present

## 2023-01-03 DIAGNOSIS — J439 Emphysema, unspecified: Secondary | ICD-10-CM | POA: Diagnosis not present

## 2023-01-03 DIAGNOSIS — Z982 Presence of cerebrospinal fluid drainage device: Secondary | ICD-10-CM | POA: Diagnosis not present

## 2023-01-03 DIAGNOSIS — Z0000 Encounter for general adult medical examination without abnormal findings: Secondary | ICD-10-CM | POA: Diagnosis not present

## 2023-01-03 DIAGNOSIS — I739 Peripheral vascular disease, unspecified: Secondary | ICD-10-CM | POA: Diagnosis not present

## 2023-01-03 DIAGNOSIS — I2510 Atherosclerotic heart disease of native coronary artery without angina pectoris: Secondary | ICD-10-CM | POA: Diagnosis not present

## 2023-01-03 DIAGNOSIS — R7301 Impaired fasting glucose: Secondary | ICD-10-CM | POA: Diagnosis not present

## 2023-01-03 DIAGNOSIS — E7800 Pure hypercholesterolemia, unspecified: Secondary | ICD-10-CM | POA: Diagnosis not present

## 2023-02-12 ENCOUNTER — Ambulatory Visit: Payer: Commercial Managed Care - HMO | Admitting: Adult Health

## 2023-05-25 DIAGNOSIS — E119 Type 2 diabetes mellitus without complications: Secondary | ICD-10-CM | POA: Diagnosis not present

## 2023-06-11 DIAGNOSIS — H2513 Age-related nuclear cataract, bilateral: Secondary | ICD-10-CM | POA: Diagnosis not present

## 2023-07-04 DIAGNOSIS — I10 Essential (primary) hypertension: Secondary | ICD-10-CM | POA: Diagnosis not present

## 2023-07-04 DIAGNOSIS — M79644 Pain in right finger(s): Secondary | ICD-10-CM | POA: Diagnosis not present

## 2023-12-03 DIAGNOSIS — Z789 Other specified health status: Secondary | ICD-10-CM | POA: Diagnosis not present

## 2023-12-03 DIAGNOSIS — Z87891 Personal history of nicotine dependence: Secondary | ICD-10-CM | POA: Diagnosis not present

## 2023-12-25 ENCOUNTER — Other Ambulatory Visit: Payer: Self-pay | Admitting: Medical Genetics

## 2023-12-27 ENCOUNTER — Other Ambulatory Visit (HOSPITAL_COMMUNITY)
Admission: RE | Admit: 2023-12-27 | Discharge: 2023-12-27 | Disposition: A | Discharge status: Home / Self Care | Payer: Self-pay | Source: Ambulatory Visit | Attending: Medical Genetics | Admitting: Medical Genetics

## 2024-01-07 ENCOUNTER — Other Ambulatory Visit (HOSPITAL_COMMUNITY): Payer: Self-pay | Admitting: Internal Medicine

## 2024-01-07 DIAGNOSIS — Z0000 Encounter for general adult medical examination without abnormal findings: Secondary | ICD-10-CM | POA: Diagnosis not present

## 2024-01-07 DIAGNOSIS — K08109 Complete loss of teeth, unspecified cause, unspecified class: Secondary | ICD-10-CM | POA: Diagnosis not present

## 2024-01-07 DIAGNOSIS — F17218 Nicotine dependence, cigarettes, with other nicotine-induced disorders: Secondary | ICD-10-CM

## 2024-01-07 DIAGNOSIS — I2510 Atherosclerotic heart disease of native coronary artery without angina pectoris: Secondary | ICD-10-CM | POA: Diagnosis not present

## 2024-01-07 DIAGNOSIS — I10 Essential (primary) hypertension: Secondary | ICD-10-CM | POA: Diagnosis not present

## 2024-01-07 DIAGNOSIS — I700 Atherosclerosis of aorta: Secondary | ICD-10-CM | POA: Diagnosis not present

## 2024-01-07 DIAGNOSIS — R7301 Impaired fasting glucose: Secondary | ICD-10-CM | POA: Diagnosis not present

## 2024-01-07 DIAGNOSIS — J439 Emphysema, unspecified: Secondary | ICD-10-CM | POA: Diagnosis not present

## 2024-01-07 DIAGNOSIS — I739 Peripheral vascular disease, unspecified: Secondary | ICD-10-CM | POA: Diagnosis not present

## 2024-01-07 DIAGNOSIS — E7800 Pure hypercholesterolemia, unspecified: Secondary | ICD-10-CM | POA: Diagnosis not present

## 2024-01-07 DIAGNOSIS — G4721 Circadian rhythm sleep disorder, delayed sleep phase type: Secondary | ICD-10-CM | POA: Diagnosis not present

## 2024-01-07 DIAGNOSIS — G4700 Insomnia, unspecified: Secondary | ICD-10-CM | POA: Diagnosis not present

## 2024-01-08 LAB — GENECONNECT MOLECULAR SCREEN: Genetic Analysis Overall Interpretation: NEGATIVE

## 2024-01-18 ENCOUNTER — Ambulatory Visit (HOSPITAL_COMMUNITY)
Admission: RE | Admit: 2024-01-18 | Discharge: 2024-01-18 | Disposition: A | Discharge status: Home / Self Care | Source: Ambulatory Visit | Attending: Internal Medicine | Admitting: Internal Medicine

## 2024-01-18 DIAGNOSIS — F17218 Nicotine dependence, cigarettes, with other nicotine-induced disorders: Secondary | ICD-10-CM | POA: Diagnosis not present

## 2024-01-18 DIAGNOSIS — F17210 Nicotine dependence, cigarettes, uncomplicated: Secondary | ICD-10-CM | POA: Diagnosis not present

## 2024-03-03 ENCOUNTER — Emergency Department (HOSPITAL_COMMUNITY)
Admission: EM | Admit: 2024-03-03 | Discharge: 2024-03-03 | Disposition: A | Discharge status: Home / Self Care | Attending: Emergency Medicine | Admitting: Emergency Medicine

## 2024-03-03 ENCOUNTER — Other Ambulatory Visit: Payer: Self-pay

## 2024-03-03 ENCOUNTER — Encounter (HOSPITAL_COMMUNITY): Payer: Self-pay | Admitting: *Deleted

## 2024-03-03 DIAGNOSIS — I10 Essential (primary) hypertension: Secondary | ICD-10-CM | POA: Insufficient documentation

## 2024-03-03 DIAGNOSIS — Z91040 Latex allergy status: Secondary | ICD-10-CM | POA: Diagnosis not present

## 2024-03-03 DIAGNOSIS — Z79899 Other long term (current) drug therapy: Secondary | ICD-10-CM | POA: Diagnosis not present

## 2024-03-03 DIAGNOSIS — S30851A Superficial foreign body of abdominal wall, initial encounter: Secondary | ICD-10-CM | POA: Diagnosis not present

## 2024-03-03 DIAGNOSIS — W57XXXA Bitten or stung by nonvenomous insect and other nonvenomous arthropods, initial encounter: Secondary | ICD-10-CM | POA: Diagnosis not present

## 2024-03-03 DIAGNOSIS — R5381 Other malaise: Secondary | ICD-10-CM

## 2024-03-03 DIAGNOSIS — Z7982 Long term (current) use of aspirin: Secondary | ICD-10-CM | POA: Diagnosis not present

## 2024-03-03 DIAGNOSIS — S30861A Insect bite (nonvenomous) of abdominal wall, initial encounter: Secondary | ICD-10-CM | POA: Diagnosis not present

## 2024-03-03 DIAGNOSIS — R5383 Other fatigue: Secondary | ICD-10-CM | POA: Diagnosis not present

## 2024-03-03 DIAGNOSIS — R1084 Generalized abdominal pain: Secondary | ICD-10-CM | POA: Diagnosis not present

## 2024-03-03 DIAGNOSIS — M791 Myalgia, unspecified site: Secondary | ICD-10-CM

## 2024-03-03 MED ORDER — DOXYCYCLINE HYCLATE 100 MG PO CAPS: 100.0000 mg | ORAL_CAPSULE | Freq: Two times a day (BID) | ORAL | 0 refills | Status: AC

## 2024-03-03 NOTE — ED Provider Notes (Signed)
  EMERGENCY DEPARTMENT AT [facility] Provider Note   CSN: [id number] Arrival date & time: 03/03/24  1126     History  No chief complaint on file.   Luis Henderson is a 68 y.o. male.  Patient with history of hypertension, hyperlipidemia presents today with complaints of myalgias and generalized malaise.  He states that over the past few days he has pulled several ticks off of himself specifically in his back and groin area. He states that he has 2 on his back and 1 on his groin that were engorged when they were removed. Yesterday he began to feel generally unwell with fatigue, myalgias, and joint pain. He states that he had a friend that was concerned and gave him a few doses of doxycycline . He has taken 2 doses so far. He reached out to his pcp who recommended he come here for evaluation. He denies fevers, nausea, vomiting, cough, congestion, headaches, dysuria, abdominal pain.   The history is provided by the patient. No language interpreter was used.       Home Medications Prior to Admission medications   Medication Sig Start Date End Date Taking? Authorizing Provider  albuterol  (PROVENTIL ) (2.5 MG/3ML) 0.083% nebulizer solution Take 2.5 mg by nebulization every 6 (six) hours. 05/26/21   [provider]  amLODipine  (NORVASC ) 10 MG tablet Take 10 mg by mouth daily.    [provider]  aspirin  EC 81 MG tablet Take 81 mg by mouth daily. Swallow whole. Patient not taking: Reported on 11/06/2022    [provider]  buPROPion  (WELLBUTRIN  SR) 150 MG 12 hr tablet Take 1 tablet (150 mg total) by mouth 2 (two) times daily. Patient not taking: Reported on 11/06/2022 09/29/21   Desai, Nikita S, MD  losartan  (COZAAR ) 50 MG tablet Take 1 tablet (50 mg total) by mouth daily. 01/30/22   Sonny Dust, MD  rosuvastatin  (CRESTOR ) 40 MG tablet Take 1 tablet (40 mg total) by mouth daily. Please call to schedule an overdue appointment with Dr.  Ardell Beauvais for refills, [phone], thank you. PT AWARE NO REFILLS UNTIL OV SCHEDULED 09/21/22   Sonny Dust, MD      Allergies    Latex, Codeine, and Lisinopril    Review of Systems   Review of Systems  Constitutional:  Positive for fatigue.  All other systems reviewed and are negative.   Physical Exam Updated Vital Signs BP (!) 158/88   Pulse 78   Temp 98.5 F (36.9 C)   Resp (!) 22   Ht 5\' 11"  (1.803 m)   Wt 83 kg   SpO2 98%   BMI 25.52 kg/m  Physical Exam Vitals and nursing note reviewed.  Constitutional:      General: He is not in acute distress.    Appearance: Normal appearance. He is normal weight. He is not ill-appearing, toxic-appearing or diaphoretic.  HENT:     Head: Normocephalic and atraumatic.  Cardiovascular:     Rate and Rhythm: Normal rate.  Pulmonary:     Effort: Pulmonary effort is normal. No respiratory distress.  Abdominal:     General: Abdomen is flat.     Palpations: Abdomen is soft.     Tenderness: There is no abdominal tenderness.     Comments: Small non-engorged tick present to the suprapubic region of the abdominal wall  Musculoskeletal:        General: Normal range of motion.     Cervical back: Normal range of motion.  Back:       Legs:     Comments: Wounds per above, each appears erythematous and warm without fluctuance or induration. No signs of retained tick fragments. No bulls eye rash present  Skin:    General: Skin is warm and dry.     Findings: No rash.  Neurological:     General: No focal deficit present.     Mental Status: He is alert.  Psychiatric:        Mood and Affect: Mood normal.        Behavior: Behavior normal.     ED Results / Procedures / Treatments   Labs (all labs ordered are listed, but only abnormal results are displayed) Labs Reviewed  LYME DISEASE SEROLOGY W/REFLEX  SPOTTED FEVER GROUP ANTIBODIES    EKG None  Radiology No results found.  Procedures .Foreign Body  Removal  Date/Time: 03/03/2024 2:06 PM  Performed by: Sherra Dk, PA-C Authorized by: Sherra Dk, PA-C  Consent: Verbal consent obtained. Risks and benefits: risks, benefits and alternatives were discussed Consent given by: patient Patient understanding: patient states understanding of the procedure being performed Patient identity confirmed: verbally with patient Body area: skin General location: trunk Location details: abdomen Removal mechanism: forceps Complexity: simple 1 objects recovered. Objects recovered: live tick Post-procedure assessment: foreign body removed Patient tolerance: patient tolerated the procedure well with no immediate complications      Medications Ordered in ED Medications - No data to display  ED Course/ Medical Decision Making/ A&P                                 Medical Decision Making  Patient presents today with complaints of generalized malaise and myalgias x 1 day. He is afebrile, non-toxic appearing, and in no acute distress with reassuring vital signs.  Physical exam per above reveals several wounds where patient states he removed ticks over the last few days.  There is no sign of erythema migrans.  There is no sign of retained tick fragments.  The areas back and groin are erythematous and warm but did not appear to be fluctuant or indurated.  No collection that would require drainage.  I did find an additional small and not engorged tick that was attached to his suprapubic region of his abdomen.  That has been removed and cleaned per above procedure which was well-tolerated.  Given patient's symptoms, I did discuss with infectious disease Liane Redman who recommends both Lyme and RMSF testing and empiric treatment with doxycycline . Will send for same. Low suspicion for other etiologies of symptoms such as rhabdomyolysis or infectious causes such as UTI or URI. I have informed the patient that his Lyme and RMSF testing is pending at his  discharge and will need to be followed up on by his pcp outpatient. Evaluation and diagnostic testing in the emergency department does not suggest an emergent condition requiring admission or immediate intervention beyond what has been performed at this time.  Plan for discharge with close PCP follow-up.  Patient is understanding and amenable with plan, educated on red flag symptoms that would prompt immediate return.  Patient discharged in stable condition.   Final Clinical Impression(s) / ED Diagnoses Final diagnoses:  Tick bite of abdominal wall, initial encounter  Malaise and fatigue  Myalgia    Rx / DC Orders ED Discharge Orders          Ordered  doxycycline  (VIBRAMYCIN ) 100 MG capsule  2 times daily        03/03/24 1416          An After Visit Summary was printed and given to the patient.     Fredna Jasper 03/03/24 1420    Auston Blush, MD 03/04/24 [phone]

## 2024-03-03 NOTE — Discharge Instructions (Signed)
 As we discussed, we have tested you for both Lyme disease and [facility] spotted fever.  These test take a few days to come back.  It is very important that you monitor your MyChart online for these test results and follow-up closely with your PCP for continued evaluation and management of the symptoms you are having.  I have gone ahead and treated you with doxycycline .  Please fill and take this prescription as prescribed in its entirety.  Please be sure that you are cleaning your wounds thoroughly and doing routine skin checks to ensure you do not have any other tick bites.  It would be most helpful if you had a friend to help you with this.  Return if development of any new or worsening symptoms.

## 2024-03-03 NOTE — ED Triage Notes (Signed)
 Pt states he has removed 4-5 ticks over past several days.  Pt states he has taken some Doxycycline  from a friend, total two doses since last night. + chills, denies fevers

## 2024-03-04 LAB — LYME DISEASE SEROLOGY W/REFLEX: Lyme Total Antibody EIA: NEGATIVE

## 2024-03-05 LAB — SPOTTED FEVER GROUP ANTIBODIES
Spotted Fever Group IgG: <1:64 {titer}
Spotted Fever Group IgM: <1:64 {titer}

## 2024-06-25 DIAGNOSIS — H2513 Age-related nuclear cataract, bilateral: Secondary | ICD-10-CM | POA: Diagnosis not present

## 2024-06-25 DIAGNOSIS — E119 Type 2 diabetes mellitus without complications: Secondary | ICD-10-CM | POA: Diagnosis not present

## 2024-06-25 DIAGNOSIS — H5213 Myopia, bilateral: Secondary | ICD-10-CM | POA: Diagnosis not present

## 2024-08-18 NOTE — Progress Notes (Addendum)
 Luis Henderson                                          MRN: [id number]   10/21/2024   The [facility] Quality Team Specialist reviewed this patient medical record for the purposes of chart review for care gap closure. The following were reviewed: chart review for care gap closure-glycemic status assessment and kidney health evaluation for diabetes:eGFR  and uACR.    [facility] Quality Team

## 2024-08-18 NOTE — Progress Notes (Signed)
 Luis Henderson                                          MRN: [id number]   08/18/2024   The [facility] Quality Team Specialist reviewed this patient medical record for the purposes of chart review for care gap closure. The following were reviewed: chart review for care gap closure-controlling blood pressure and glycemic status assessment.    [facility] Quality Team

## 2024-09-19 ENCOUNTER — Encounter (HOSPITAL_BASED_OUTPATIENT_CLINIC_OR_DEPARTMENT_OTHER): Payer: Self-pay | Admitting: Surgery

## 2024-10-30 ENCOUNTER — Encounter: Payer: Self-pay | Admitting: *Deleted

## 2024-10-30 NOTE — Progress Notes (Signed)
 Luis Henderson                                          MRN: [id number]   10/30/2024   The [facility] Quality Team Specialist reviewed this patient medical record for the purposes of chart review for care gap closure. The following were reviewed: chart review for care gap closure-glycemic status assessment.    [facility] Quality Team
